# Patient Record
Sex: Female | Born: 1950 | Race: Black or African American | Hispanic: No
Health system: Midwestern US, Community
[De-identification: ages and names within clinical notes are randomized; demographics above are authoritative.]

## PROBLEM LIST (undated history)

## (undated) DIAGNOSIS — M8610 Other acute osteomyelitis, unspecified site: Principal | ICD-10-CM

## (undated) DIAGNOSIS — I739 Peripheral vascular disease, unspecified: Principal | ICD-10-CM

## (undated) DIAGNOSIS — I70244 Atherosclerosis of native arteries of left leg with ulceration of heel and midfoot: Principal | ICD-10-CM

## (undated) DIAGNOSIS — I70234 Atherosclerosis of native arteries of right leg with ulceration of heel and midfoot: Secondary | ICD-10-CM

## (undated) DIAGNOSIS — H90A22 Sensorineural hearing loss, unilateral, left ear, with restricted hearing on the contralateral side: Secondary | ICD-10-CM

## (undated) DIAGNOSIS — E785 Hyperlipidemia, unspecified: Secondary | ICD-10-CM

## (undated) DIAGNOSIS — G8929 Other chronic pain: Secondary | ICD-10-CM

## (undated) DIAGNOSIS — I10 Essential (primary) hypertension: Secondary | ICD-10-CM

## (undated) DIAGNOSIS — Z760 Encounter for issue of repeat prescription: Secondary | ICD-10-CM

## (undated) DIAGNOSIS — M25562 Pain in left knee: Secondary | ICD-10-CM

## (undated) DIAGNOSIS — M25561 Pain in right knee: Secondary | ICD-10-CM

## (undated) DIAGNOSIS — M542 Cervicalgia: Secondary | ICD-10-CM

## (undated) DIAGNOSIS — M25511 Pain in right shoulder: Secondary | ICD-10-CM

## (undated) DIAGNOSIS — J432 Centrilobular emphysema: Secondary | ICD-10-CM

---

## 2005-09-11 NOTE — Procedures (Signed)
[facility]                         PERIPHERAL VASCULAR LABORATORY   NAME:     CLEMONS, Omnia                   DATE:   09/11/2005   AGE/DOB: 54  /  08/17/1951                      ROOM #: OP   SEX:      F                                 MR #:    [id number]   CPT CODE: 93970                            SS#     [id number]   REFERRING PHYSICIAN:   LISA SHEA KENNEDY, M.D.   CHIEF COMPLAINT/SYMPTOMS:  PAIN IN LIMB   EXAMINATION:   LOWER EXTREMITY VENOUS   INTERPRETATION:    Bilateral lower extremity venous Doppler exam revealed   patent vessels with normal spontaneity and phasicity of signals with normal   augmentation.  Deep venous valves appeared competent.   Duplex imaging revealed no intraluminal thrombus of the lower extremities   bilaterally.   IMPRESSION:   No evidence of bilateral lower extremity deep venous thrombosis or   significant venous valvular incompetence.   Electronically Signed By:   RASESH M. SHAH, M.D. 09/13/2005 12:39   ______________________________________________   RASESH M. SHAH, M.D.   lo  D: 09/12/2005  T: 09/12/2005  7:14 P  [id number]

## 2005-09-12 NOTE — Procedures (Signed)
[facility]                         PERIPHERAL VASCULAR LABORATORY   NAME:     [name], Susan                   DATE:   09/11/2005   AGE/DOB: 54  /  25-Aug-1951                      ROOM #: OP   SEX:      F                                 MR #:    [id number]   CPT CODE: 04540                            SS#     [id number]   REFERRING PHYSICIAN:   Stacey Drain, M.D.   CHIEF COMPLAINT/SYMPTOMS:  PAIN IN LIMB   EXAMINATION:   LOWER EXTREMITY VENOUS   INTERPRETATION:    Bilateral lower extremity venous Doppler exam revealed   patent vessels with normal spontaneity and phasicity of signals with normal   augmentation.  Deep venous valves appeared competent.   Duplex imaging revealed no intraluminal thrombus of the lower extremities   bilaterally.   IMPRESSION:   No evidence of bilateral lower extremity deep venous thrombosis or   significant venous valvular incompetence.   Electronically Signed By:   Michael Litter, M.D. 09/13/2005 12:39   ______________________________________________   Michael Litter, M.D.   lo  D: 09/12/2005  T: 09/12/2005  7:14 P  [id number]

## 2010-07-17 ENCOUNTER — Emergency Department (HOSPITAL_COMMUNITY): Admission: EM | Admit: 2010-07-17 | Discharge: 2010-07-17 | Payer: Self-pay | Admitting: Emergency Medicine

## 2010-07-19 ENCOUNTER — Emergency Department (HOSPITAL_COMMUNITY): Admission: EM | Admit: 2010-07-19 | Discharge: 2010-07-20 | Payer: Self-pay | Admitting: Emergency Medicine

## 2010-07-19 ENCOUNTER — Emergency Department (HOSPITAL_COMMUNITY): Admission: EM | Admit: 2010-07-19 | Discharge: 2010-07-19 | Payer: Self-pay | Admitting: Family Medicine

## 2010-12-14 LAB — POCT URINALYSIS DIPSTICK
Glucose, UA: NEGATIVE mg/dL
Ketones, ur: 15 mg/dL — AB
Nitrite: NEGATIVE

## 2010-12-14 LAB — COMPREHENSIVE METABOLIC PANEL WITH GFR
Albumin: 4.3 g/dL (ref 3.5–5.2)
BUN: 21 mg/dL (ref 6–23)
Calcium: 9.8 mg/dL (ref 8.4–10.5)
Chloride: 97 meq/L (ref 96–112)
Creatinine, Ser: 1.07 mg/dL (ref 0.4–1.2)
Total Bilirubin: 0.8 mg/dL (ref 0.3–1.2)

## 2010-12-14 LAB — URINALYSIS, ROUTINE W REFLEX MICROSCOPIC
Glucose, UA: NEGATIVE mg/dL
Ketones, ur: 15 mg/dL — AB
Leukocytes, UA: NEGATIVE
Nitrite: NEGATIVE
Protein, ur: 30 mg/dL — AB
Protein, ur: NEGATIVE mg/dL
Specific Gravity, Urine: 1.042 — ABNORMAL HIGH (ref 1.005–1.030)
Urobilinogen, UA: 1 mg/dL (ref 0.0–1.0)
Urobilinogen, UA: 1 mg/dL (ref 0.0–1.0)
pH: 5.5 (ref 5.0–8.0)

## 2010-12-14 LAB — CBC
HCT: 38.4 % (ref 36.0–46.0)
HCT: 44.8 % (ref 36.0–46.0)
Hemoglobin: 15.2 g/dL — ABNORMAL HIGH (ref 12.0–15.0)
MCH: 31.1 pg (ref 26.0–34.0)
MCHC: 33.9 g/dL (ref 30.0–36.0)
MCV: 91.6 fL (ref 78.0–100.0)
MCV: 93.2 fL (ref 78.0–100.0)
Platelets: 183 10*3/uL (ref 150–400)
Platelets: 251 K/uL (ref 150–400)
RBC: 4.12 MIL/uL (ref 3.87–5.11)
RBC: 4.89 MIL/uL (ref 3.87–5.11)
RDW: 13.8 % (ref 11.5–15.5)
WBC: 6.9 10*3/uL (ref 4.0–10.5)
WBC: 9.8 10*3/uL (ref 4.0–10.5)

## 2010-12-14 LAB — COMPREHENSIVE METABOLIC PANEL
ALT: 25 U/L (ref 0–35)
AST: 35 U/L (ref 0–37)
Albumin: 3.7 g/dL (ref 3.5–5.2)
Alkaline Phosphatase: 72 U/L (ref 39–117)
Alkaline Phosphatase: 74 U/L (ref 39–117)
BUN: 8 mg/dL (ref 6–23)
CO2: 28 mEq/L (ref 19–32)
Chloride: 102 mEq/L (ref 96–112)
GFR calc Af Amer: >60 mL/min (ref >60)
GFR calc non Af Amer: 53 mL/min — ABNORMAL LOW (ref >60)
Glucose, Bld: 117 mg/dL — ABNORMAL HIGH (ref 70–99)
Glucose, Bld: 128 mg/dL — ABNORMAL HIGH (ref 70–99)
Potassium: 2.9 mEq/L — ABNORMAL LOW (ref 3.5–5.1)
Potassium: 3.7 mEq/L (ref 3.5–5.1)
Sodium: 139 mEq/L (ref 135–145)
Total Bilirubin: 0.7 mg/dL (ref 0.3–1.2)
Total Protein: 8.6 g/dL — ABNORMAL HIGH (ref 6.0–8.3)

## 2010-12-14 LAB — DIFFERENTIAL
Basophils Absolute: 0 10*3/uL (ref 0.0–0.1)
Basophils Absolute: 0 K/uL (ref 0.0–0.1)
Basophils Relative: 0 % (ref 0–1)
Basophils Relative: 0 % (ref 0–1)
Eosinophils Absolute: 0 10*3/uL (ref 0.0–0.7)
Eosinophils Absolute: 0 10*3/uL (ref 0.0–0.7)
Eosinophils Relative: 0 % (ref 0–5)
Eosinophils Relative: 0 % (ref 0–5)
Lymphocytes Relative: 16 % (ref 12–46)
Lymphs Abs: 1.6 10*3/uL (ref 0.7–4.0)
Monocytes Absolute: 0.6 10*3/uL (ref 0.1–1.0)
Monocytes Absolute: 0.8 K/uL (ref 0.1–1.0)
Monocytes Relative: 9 % (ref 3–12)
Neutro Abs: 4.8 10*3/uL (ref 1.7–7.7)
Neutro Abs: 7.4 K/uL (ref 1.7–7.7)
Neutrophils Relative %: 75 % (ref 43–77)

## 2010-12-14 LAB — LIPASE, BLOOD: Lipase: 20 U/L (ref 11–59)

## 2010-12-14 LAB — URINE MICROSCOPIC-ADD ON

## 2010-12-14 LAB — RAPID URINE DRUG SCREEN, HOSP PERFORMED: Barbiturates: NOT DETECTED

## 2011-08-14 IMAGING — CR DG ABDOMEN ACUTE W/ 1V CHEST
4 series · 4 of 4 positions shown · non-contrast
Comparison: none

CLINICAL DATA: Flu-like symptoms

ACUTE ABDOMEN SERIES (ABDOMEN 2 VIEW & CHEST 1 VIEW)

[w chest pa]
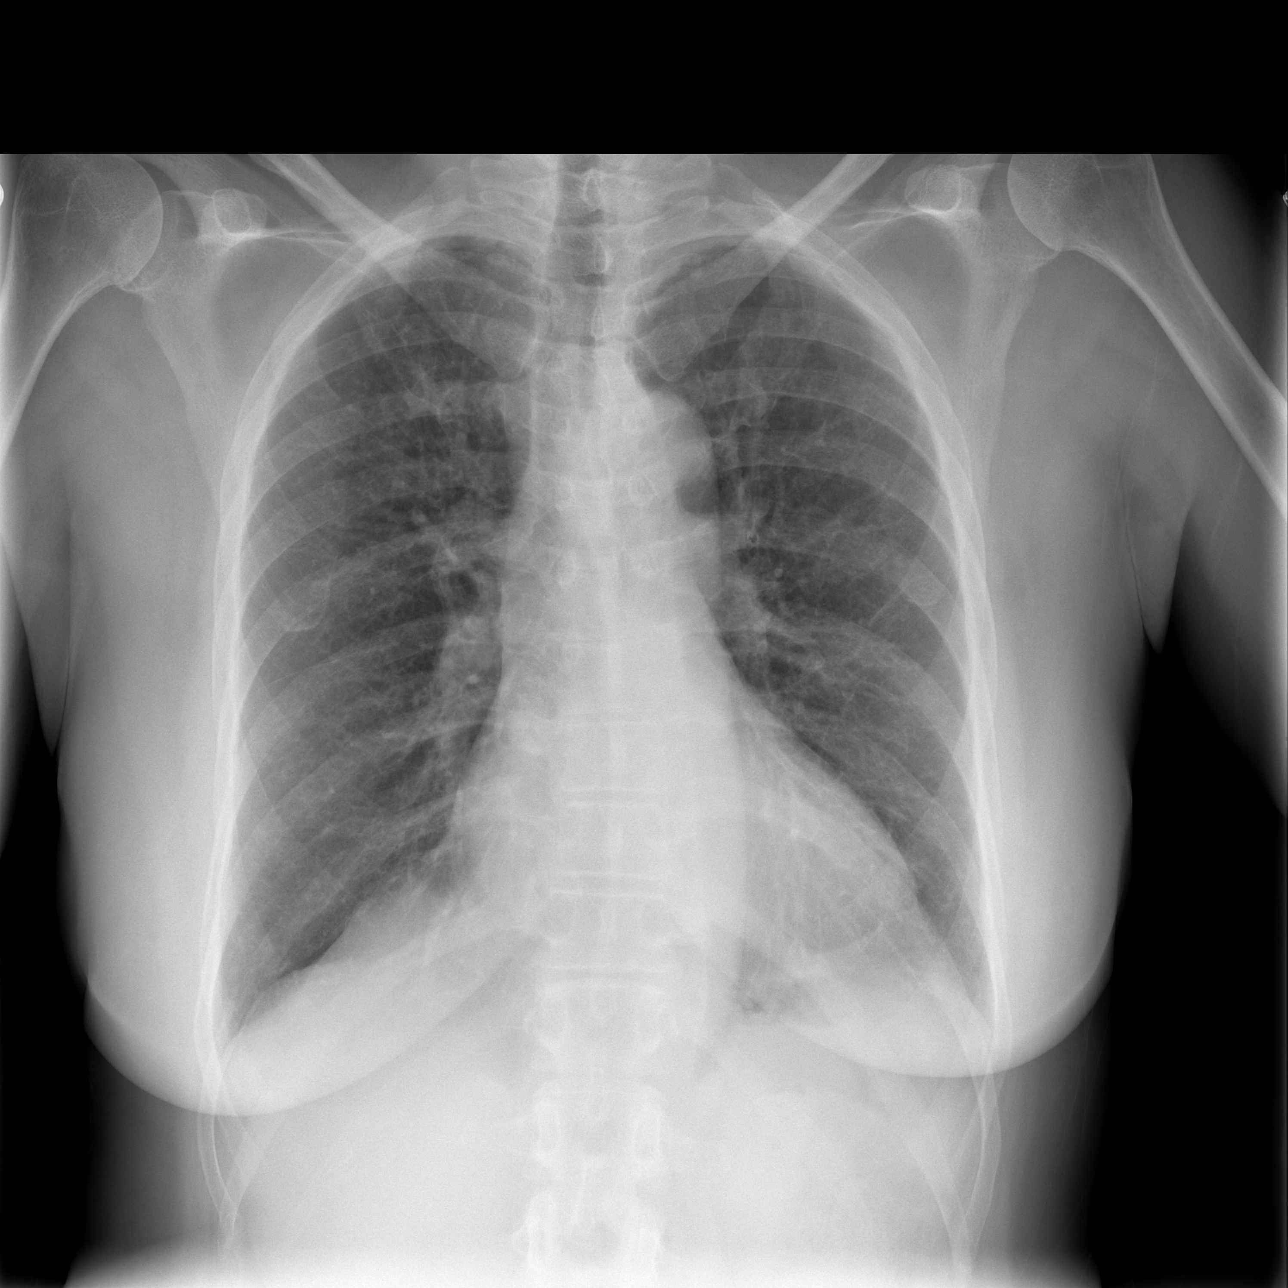

[w abdomen upright]
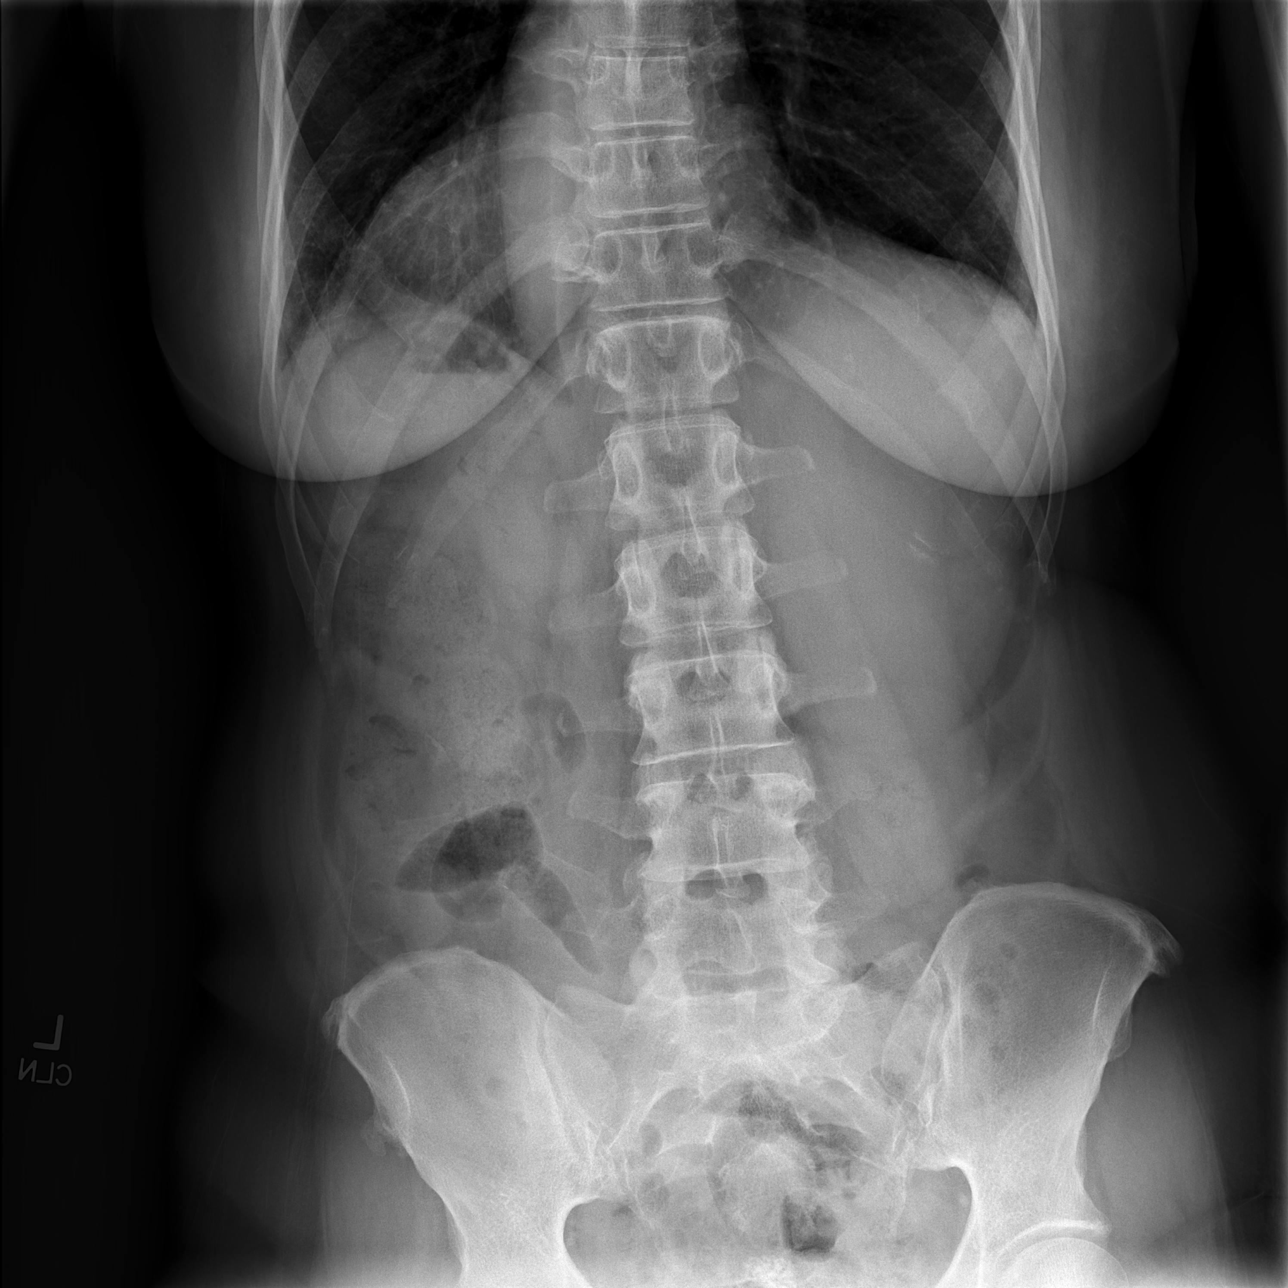

[t abdomen supine (1 of 2)]
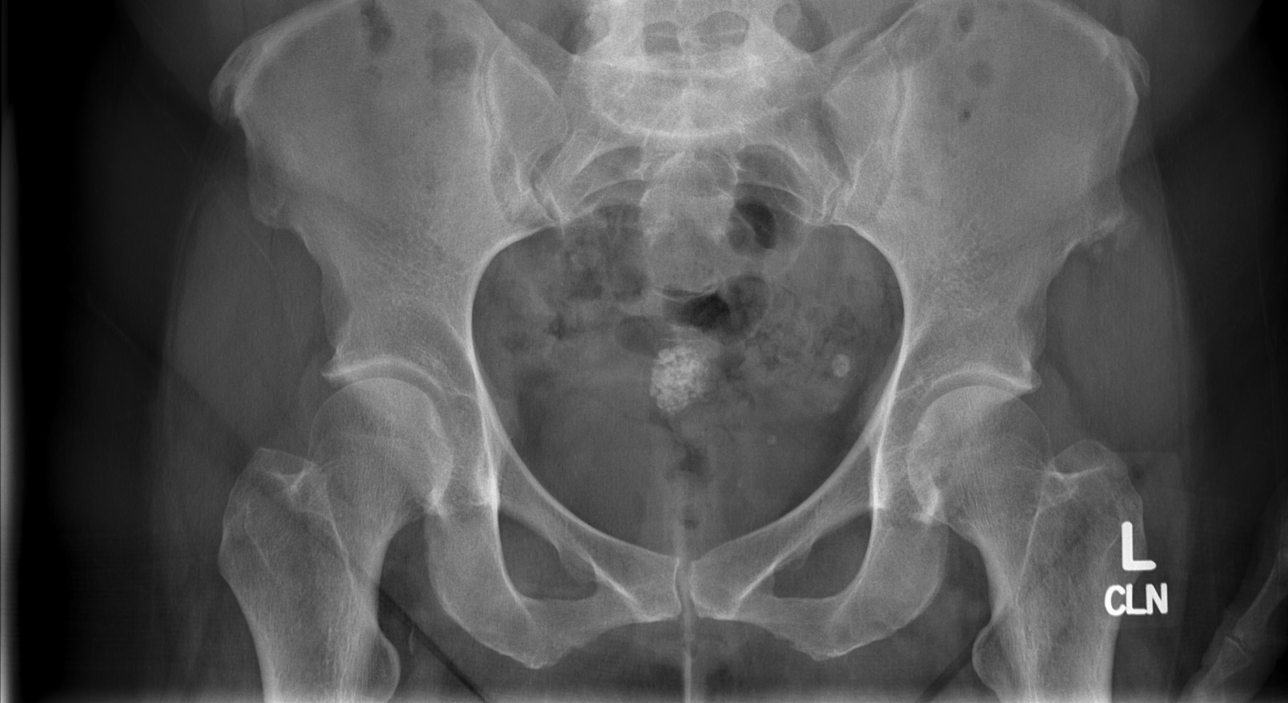

[t abdomen supine (2 of 2)]
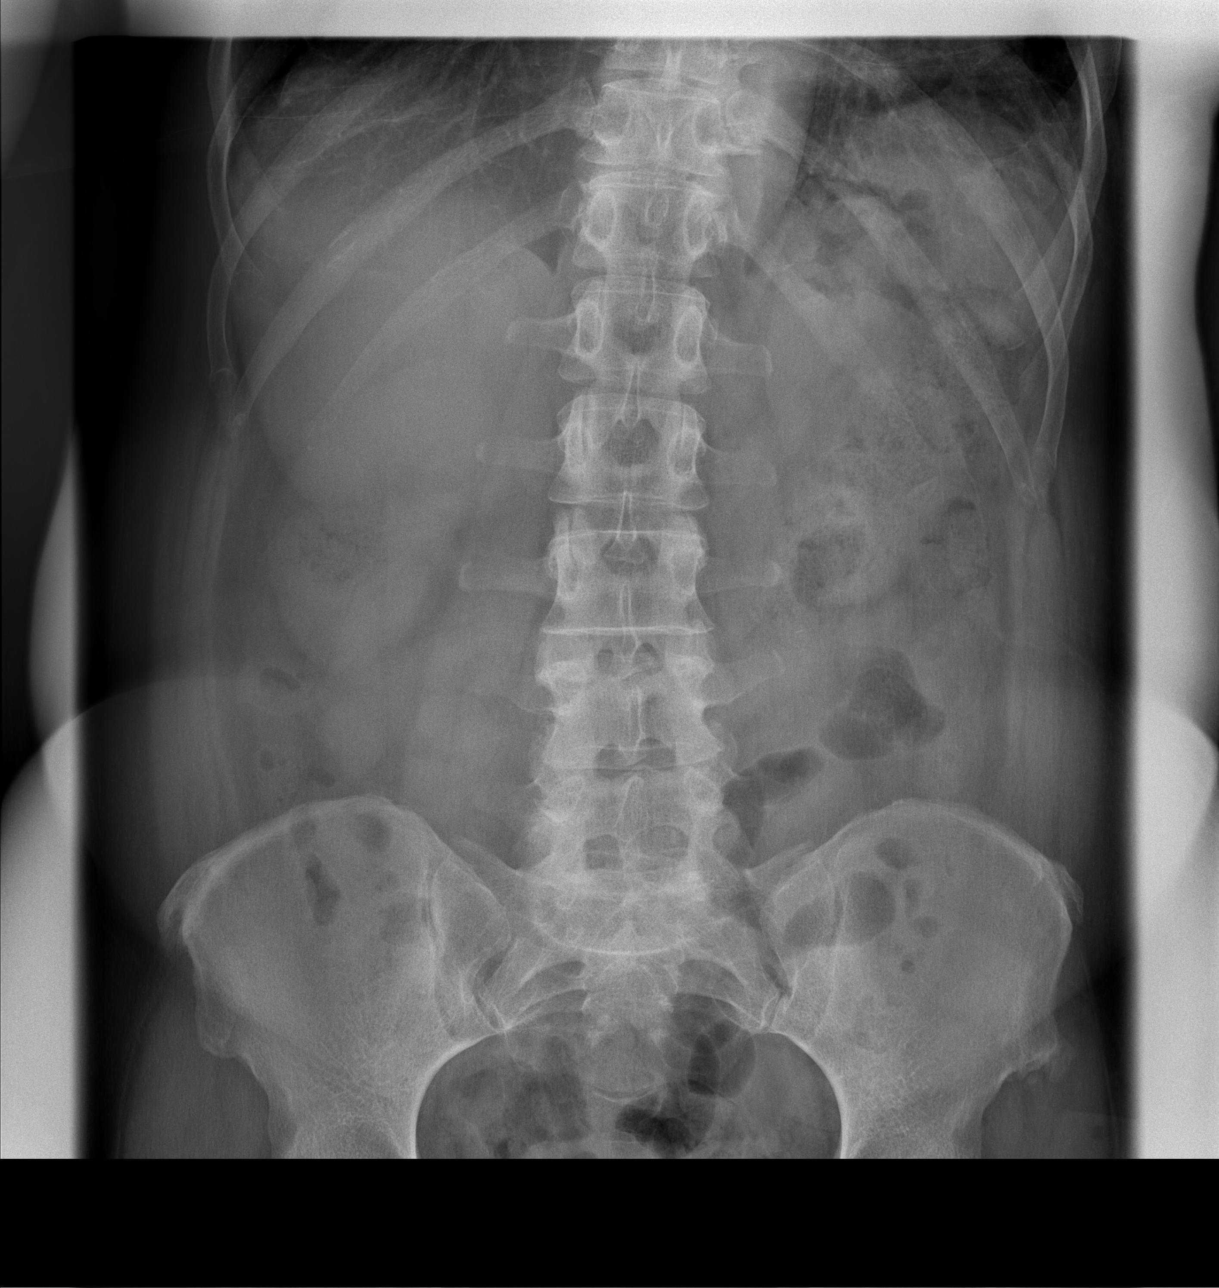

[4 of 4 positions shown; findings below may reference images not displayed]

FINDINGS: Normal mediastinum and heart silhouette.  There is
chronic bronchitic change in the lung fields.  No focal
consolidation.  No free air beneath the hemidiaphragms.

No dilated loops of large or small bowel.  There is gas and stool
in the rectosigmoid colon.  Calcified leiomyoma within the uterus.
No other pathologic calcifications.  Acute bony abnormality.
IMPRESSION: 1.  No acute cardiopulmonary process.
2.  No evidence of bowel obstruction or intraperitoneal free air.

## 2011-11-22 LAB — DRUG SCREEN, URINE
AMPHETAMINES: NEGATIVE
BARBITURATES: POSITIVE — AB
BENZODIAZEPINES: NEGATIVE
COCAINE: NEGATIVE
METHADONE: NEGATIVE
OPIATES: NEGATIVE
PCP(PHENCYCLIDINE): NEGATIVE
THC (TH-CANNABINOL): NEGATIVE

## 2011-11-22 LAB — ETHYL ALCOHOL: ALCOHOL(ETHYL),SERUM: <3 MG/DL (ref 0–3)

## 2011-11-22 MED ORDER — KETOROLAC TROMETHAMINE 60 MG/2 ML IM
60 mg/2 mL | INTRAMUSCULAR | Status: AC
Start: 2011-11-22 — End: 2011-11-22
  Administered 2011-11-22: 16:00:00 via INTRAMUSCULAR

## 2011-11-22 MED ORDER — LISINOPRIL 10 MG TAB
10 mg | ORAL | Status: AC
Start: 2011-11-22 — End: 2011-11-22
  Administered 2011-11-22: 16:00:00 via ORAL

## 2011-11-22 MED ORDER — BUTALBITAL-ACETAMINOPHEN-CAFFEINE 50 MG-325 MG-40 MG TAB
50-325-40 mg | ORAL | Status: AC
Start: 2011-11-22 — End: 2011-11-22
  Administered 2011-11-22: 17:00:00 via ORAL

## 2011-11-22 MED FILL — LISINOPRIL 10 MG TAB: 10 mg | ORAL | Qty: 1

## 2011-11-22 MED FILL — KETOROLAC TROMETHAMINE 60 MG/2 ML IM: 60 mg/2 mL | INTRAMUSCULAR | Qty: 2

## 2011-11-22 MED FILL — BUTALBITAL-ACETAMINOPHEN-CAFFEINE 50 MG-325 MG-40 MG TAB: 50-325-40 mg | ORAL | Qty: 1

## 2011-11-22 NOTE — ED Notes (Signed)
Assumed care of pt from mike spears, rn. Pt resting on cart with eyes closed. Equal and unlabored resp noted. No acute distress noted.will continue to monitor pt.

## 2011-11-22 NOTE — Other (Signed)
Pt presents to ED with complaint of being out of her medications and suicidal ideations. Pt reports she has been having increased depression since the death of her mother. Pt reports that her mother died last year on 2022-12-06. Pt states she has been off her medications for past two months due to leaving [place] and relocating to [place]. Pt reports she is unemployed and" partially homeless". Pt's mood is depressed with sad, tearful mood. Pt reports difficulty sleeping. Pt states "I am just tired and don't feel I have any reason to be here". Pt reports previous suicide attempt via overdose of medications while living in [place]. Pt is unable to contract for safety at this time. Pt is voluntary for MH/SA treatment at this time. Discussed with PA Thamas Jaegers and he concurs with MH/SA treatment at this time. Contacted [facility] to evaluate for Crisis Stabilization if appropriate.

## 2011-11-22 NOTE — ED Notes (Signed)
Pt resting comfortably with eyes closed @ this time. Respirations even and unlabored. Easily awakens to voice and light touch.  No distress noted.  Will continue to monitor.

## 2011-11-22 NOTE — ED Provider Notes (Addendum)
HPI Comments: Pt here after duress of homelessness, living with her sister-in-law, moving from [place] to [place] 2 yrs ago to take care of her aging mother, she passed 1 yr ago. Then she couldn't keep her mother's house. Her husband is incarcerated, she moved to [place] with her girlfriends and then back to [place] 2 months ago. Has been out of her 2 antihtns and her 2 psych meds for depression. Denies cp/sob/cough/fever. Pt has also had a headache for a week or so which is typical with stress related headaches for her.    The history is provided by the patient. The history is limited by the absence of a caregiver.        Past Medical History   Diagnosis Date   ??? Hypertension    ??? Asthma    ??? Psychiatric disorder    ??? Bipolar 1 disorder         Past Surgical History   Procedure Date   ??? Hx heent          No family history on file.     History     Social History   ??? Marital Status: LEGALLY SEPARATED     Spouse Name: N/A     Number of Children: N/A   ??? Years of Education: N/A     Occupational History   ??? Not on file.     Social History Main Topics   ??? Smoking status: Current Everyday Smoker   ??? Smokeless tobacco: Not on file   ??? Alcohol Use: No   ??? Drug Use: No   ??? Sexually Active: No     Other Topics Concern   ??? Not on file     Social History Narrative   ??? No narrative on file                  ALLERGIES: Penicillins      Review of Systems   Constitutional:        SEE HPI   Eyes: Negative.    Respiratory: Negative.    Cardiovascular: Negative.    Gastrointestinal: Negative.    Genitourinary: Negative.    Musculoskeletal: Negative.    Skin: Negative.    Neurological: Positive for headaches. Negative for dizziness, tremors, seizures, syncope, facial asymmetry, speech difficulty, weakness, light-headedness and numbness.   Hematological: Negative.    Psychiatric/Behavioral: Positive for suicidal ideas. Negative for hallucinations, behavioral problems, confusion, disturbed wake/sleep cycle, self-injury, dysphoric mood, decreased  concentration and agitation. The patient is nervous/anxious. The patient is not hyperactive.    All other systems reviewed and are negative.        Filed Vitals:    11/22/11 1032   BP: 185/107   Pulse: 72   Temp: 98.1 ??F (36.7 ??C)   Resp: 16   SpO2: 100%            Physical Exam   Nursing note and vitals reviewed.  Constitutional: She is oriented to person, place, and time. She appears well-developed and well-nourished.   HENT:   Head: Normocephalic and atraumatic.   Right Ear: External ear normal.   Left Ear: External ear normal.   Nose: Nose normal.   Mouth/Throat: Oropharynx is clear and moist.   Eyes: Conjunctivae and EOM are normal. Pupils are equal, round, and reactive to light. Right eye exhibits no discharge. Left eye exhibits no discharge. No scleral icterus.        Noted OS lateral strabismus otherwise EOMI   Neck:  Normal range of motion. Neck supple.   Cardiovascular: Normal rate, normal heart sounds and intact distal pulses.    Pulmonary/Chest: Effort normal and breath sounds normal.   Abdominal: Soft. Bowel sounds are normal.   Musculoskeletal: Normal range of motion.   Neurological: She is alert and oriented to person, place, and time. She has normal reflexes.   Skin: Skin is warm and dry.   Psychiatric: Her behavior is normal. Judgment and thought content normal.        Pt feels suicidal and that she has no options and feels helpless        MDM     Differential Diagnosis; Clinical Impression; Plan:     Discussion with pt and her psych complaints with Psych. DR. Velora Heckler phd.    [facility] has seen awaiting a bed. Dr Lovell Sheehan to re- eval in the am to see if a bed has been found.    Dr. Lovell Sheehan to see pt again to check on bed status.    Pt has dispo [facility] DR. Marge Duncans .      Procedures  Diagnosis: @DIAGX (<PARAMETER> error)@      Disposition: Transfer     Follow-up Information     None          Patient's Medications    No medications on file

## 2011-11-22 NOTE — ED Notes (Signed)
Medications taken from pt and given to charge RN.

## 2011-11-22 NOTE — ED Notes (Addendum)
"  I'm suicidal" Pt denies plan. States hearing voices and people are trying to get at her. Pt calm and cooperative. Pt states out of psych meds x 1 month. Pt states police brought her in today. Police officers did not stay to speak to triage nurse about pt. Pt also states has headache since yesterday. Pt states out of BP meds x 1 month as well. Charge nurse notified of pt condition.

## 2011-11-22 NOTE — ED Notes (Signed)
Pt here D/T having suicidal ideations. States that she has been experiencing a headache for the past 2 days and has felt suicidal for the same amount of time. States not had her meds for depression and bi-polar disorder for over a month, nor has she had her BP meds either for the same amount of time. States being under a lot of stress since her mother passed away. States she is homeless and jobless.

## 2011-11-22 NOTE — ED Notes (Signed)
Pt c/o headache. Pt states unsure if it is from headache or blood pressure. VS to PA [place]. Pt given warm blanket.

## 2011-11-23 MED ORDER — OXYCODONE-ACETAMINOPHEN 5 MG-325 MG TAB
5-325 mg | ORAL | Status: AC
Start: 2011-11-23 — End: 2011-11-22
  Administered 2011-11-23: 02:00:00 via ORAL

## 2011-11-23 MED FILL — OXYCODONE-ACETAMINOPHEN 5 MG-325 MG TAB: 5-325 mg | ORAL | Qty: 1

## 2011-11-23 NOTE — ED Notes (Signed)
Admission assessment( braden scale, nutrition and valuables ) all entered on wrong patient.

## 2011-11-23 NOTE — ED Notes (Signed)
Assumed care- Resting quietly on stretcher- Awaiting bed placement/ final disposition.

## 2011-11-23 NOTE — ED Notes (Signed)
Remains sleeping quietly- Breakfast ordered.

## 2011-11-23 NOTE — ED Notes (Signed)
Up to bathroom as needed- Breakfast tray delivered-

## 2011-11-23 NOTE — ED Notes (Signed)
Patient resting on stretcher in no evident distress at this time. Breathing even and unlabored. Vital signs stable on bedside monitor. Eyes closed. Will continue to monitor.

## 2011-11-23 NOTE — ED Provider Notes (Signed)
Patient is a 61 y.o. female presenting with chest pain. The history is provided by the patient.   Chest Pain (Angina)   This is a new problem. The current episode started yesterday. The problem has not changed since onset.The problem occurs constantly. The pain is at a severity of 6/10. The pain is mild. The quality of the pain is described as pressure-like. The pain radiates to the left arm. The symptoms are aggravated by movement. Pertinent negatives include no abdominal pain, no back pain, no claudication, no cough, no diaphoresis, no dizziness, no fever, no headaches, no hemoptysis, no leg pain, no malaise/fatigue, no nausea, no numbness, no orthopnea, no palpitations, no PND, no shortness of breath, no vomiting and no weakness. She has tried nothing for the symptoms. Risk factors include smoking/tobacco exposure and hypertension. Her past medical history is significant for HTN.Her past medical history does not include aneurysm, cancer, DM, DVT, PE or CHF.    HPI: patient was just released from our ED yesterday for SI. [facility] came to evaluate her and they placed her in a psychiatric facility in [place]. While she was there she told the staff she had chest pain. Interestingly enough the chest pain has been going on for over 48 hours but never told the ED staff or ED physician when she was in the ED yesterday for SI. She tells me she has no cardiac history, no previous MI. The pain is reproducible is over the left anterior chest wall and radiates into the left arm. No SOB. The patient is a smoker    Past Medical History   Diagnosis Date   ??? Hypertension    ??? Asthma    ??? Psychiatric disorder    ??? Bipolar 1 disorder         Past Surgical History   Procedure Date   ??? Hx heent          No family history on file.     History     Social History   ??? Marital Status: LEGALLY SEPARATED     Spouse Name: N/A     Number of Children: N/A   ??? Years of Education: N/A     Occupational History   ??? Not on file.     Social History  Main Topics   ??? Smoking status: Current Everyday Smoker   ??? Smokeless tobacco: Not on file   ??? Alcohol Use: No   ??? Drug Use: No   ??? Sexually Active: No     Other Topics Concern   ??? Not on file     Social History Narrative   ??? No narrative on file                  ALLERGIES: Penicillins      Review of Systems   Constitutional: Negative.  Negative for fever, chills, malaise/fatigue and diaphoresis.   HENT: Negative.  Negative for congestion, sore throat, rhinorrhea and neck pain.    Eyes: Negative.  Negative for pain, discharge and redness.   Respiratory: Negative.  Negative for cough, hemoptysis, chest tightness, shortness of breath and wheezing.    Cardiovascular: Positive for chest pain. Negative for palpitations, orthopnea, claudication and PND.   Gastrointestinal: Negative.  Negative for nausea, vomiting, abdominal pain, diarrhea and constipation.   Genitourinary: Negative.  Negative for dysuria, urgency, frequency, hematuria and flank pain.   Musculoskeletal: Negative.  Negative for back pain.   Skin: Negative.  Negative for rash.   Neurological: Negative.  Negative for dizziness, syncope, weakness, numbness and headaches.   Hematological: Negative.    Psychiatric/Behavioral: Positive for suicidal ideas.   All other systems reviewed and are negative.        Filed Vitals:    11/23/11 1942 11/23/11 1943 11/23/11 1944 11/23/11 2000   BP:    158/92   Pulse: 62 65 71 74   Temp:   98.5 ??F (36.9 ??C)    Resp: 14 14  16    Height:   5\' 6"  (1.676 m)    Weight:   90.719 kg (200 lb)    SpO2: 96% 96%  97%            Physical Exam   Nursing note and vitals reviewed.  Constitutional: She is oriented to person, place, and time. She appears well-developed and well-nourished. She is cooperative.  Non-toxic appearance. She does not have a sickly appearance. No distress.   HENT:   Head: Normocephalic and atraumatic.   Nose: Nose normal.   Mouth/Throat: Oropharynx is clear and moist. No oropharyngeal exudate.   Eyes: Conjunctivae and  EOM are normal. Pupils are equal, round, and reactive to light. Right eye exhibits no discharge. Left eye exhibits no discharge. No scleral icterus.   Neck: Normal range of motion. Neck supple. No JVD present. No tracheal deviation, no edema and normal range of motion present.   Cardiovascular: Normal rate, regular rhythm, normal heart sounds and intact distal pulses.  Exam reveals no gallop and no friction rub.    No murmur heard.  Pulmonary/Chest: Effort normal and breath sounds normal. No stridor. Not tachypneic. No respiratory distress. She has no wheezes. She has no rhonchi. She has no rales. She exhibits no tenderness.   Abdominal: Soft. She exhibits no distension. There is no tenderness. There is no rebound, no guarding and no CVA tenderness.   Musculoskeletal: Normal range of motion. She exhibits tenderness. She exhibits no edema.        Reproducible chest wall pain over the left anterior chest wall   Lymphadenopathy:     She has no cervical adenopathy.   Neurological: She is alert and oriented to person, place, and time. She has normal reflexes. No cranial nerve deficit or sensory deficit. She exhibits normal muscle tone. Coordination normal. GCS eye subscore is 4. GCS verbal subscore is 5. GCS motor subscore is 6.   Skin: Skin is dry. No rash noted. No erythema.   Psychiatric: She has a normal mood and affect. Her speech is normal and behavior is normal. Judgment and thought content normal. Cognition and memory are normal.        MDM     Differential Diagnosis; Clinical Impression; Plan:     DDX: ACS, MI, chest wall pain, GERD    This patient has atypical chest pain. I am not sure why the patient sat in the ED for most of the day yesterday with chest pain but did not tell any body. It was not until she was admitted to the psychiatric facility that she decided to tell someone about the chest pain. Her chest pain is reproducible and has now been ongoing for over 24 hours. Her cardiac enzymes are all normal  and this leads me to believe this pain is not likely cardiac although I cannot rule out ACS in this case.    Patient has had no chest pain a few hours after being in the ED. I have ordered a second set of trop and will have her  scheduled for a stress test today. I spoke to Dr Corinda Gubler about admitting her but he wanted to keep her in the Ed and have her get a cardiac stress test today out of the ED. Patient has been informed of the plan to keep her in the ED and get the stress test in the AM.  Amount and/or Complexity of Data Reviewed:   Clinical lab tests:  Ordered and reviewed  Tests in the radiology section of CPT??:  Ordered and reviewed   Decide to obtain previous medical records or to obtain history from someone other than the patient:  Yes  Risk of Significant Complications, Morbidity, and/or Mortality:   Presenting problems:  Moderate  Diagnostic procedures:  Moderate  Management options:  Moderate      Procedures

## 2011-11-23 NOTE — ED Notes (Signed)
Pt now awake and asking to eat. Pt given crackers and apple juices. Pt denies further needs/requests at this time.

## 2011-11-23 NOTE — ED Notes (Signed)
Crisis counselor at bedside- Awaiting breakfast and final disposition.

## 2011-11-23 NOTE — ED Notes (Signed)
Pt in  [place] awaiting final disposition by [facility].

## 2011-11-23 NOTE — ED Notes (Signed)
Intermittent left chest pain worsening since this am. No pain at this time.

## 2011-11-23 NOTE — ED Notes (Signed)
Patient ambulated to bathroom in no evident distress. Complains of intermittent sharp pains in left arm that are worse with palpation and movement.

## 2011-11-23 NOTE — ED Notes (Signed)
Bedside and Verbal shift change report given to nancy, rn (oncoming nurse) by Anel Purohit A Scotlyn Mccranie, RN (offgoing nurse).  Report given with SBAR, ED Summary and MAR.

## 2011-11-23 NOTE — ED Notes (Signed)
Patient continues to complain of pain in her left arm. Dr. Elmer Sow aware, ordered percocet.

## 2011-11-23 NOTE — ED Notes (Signed)
Moved to hallway with sitter awaiting final placement.

## 2011-11-24 LAB — CBC WITH AUTOMATED DIFF
ABS. BASOPHILS: 0 10*3/uL (ref 0.0–0.06)
ABS. EOSINOPHILS: 0.1 10*3/uL (ref 0.0–0.4)
ABS. LYMPHOCYTES: 1.9 10*3/uL (ref 0.9–3.6)
ABS. MONOCYTES: 0.4 10*3/uL (ref 0.05–1.2)
ABS. NEUTROPHILS: 3.1 10*3/uL (ref 1.8–8.0)
BASOPHILS: 0 % (ref 0–2)
EOSINOPHILS: 1 % (ref 0–5)
HCT: 42.8 % (ref 35.0–45.0)
HGB: 13.9 g/dL (ref 12.0–16.0)
LYMPHOCYTES: 34 % (ref 21–52)
MCH: 31.4 PG (ref 24.0–34.0)
MCHC: 32.5 g/dL (ref 31.0–37.0)
MCV: 96.8 FL (ref 74.0–97.0)
MONOCYTES: 8 % (ref 3–10)
MPV: 10 FL (ref 9.2–11.8)
NEUTROPHILS: 57 % (ref 40–73)
PLATELET: 249 10*3/uL (ref 135–420)
RBC: 4.42 M/uL (ref 4.20–5.30)
RDW: 13.5 % (ref 11.6–14.5)
WBC: 5.4 10*3/uL (ref 4.6–13.2)

## 2011-11-24 LAB — METABOLIC PANEL, BASIC
Anion gap: 7 mmol/L (ref 3.0–18)
BUN/Creatinine ratio: 18 (ref 12–20)
BUN: 18 MG/DL (ref 7.0–18)
CO2: 30 MMOL/L (ref 21–32)
Calcium: 9.1 MG/DL (ref 8.5–10.1)
Chloride: 104 MMOL/L (ref 100–108)
Creatinine: 1.02 MG/DL (ref 0.6–1.3)
GFR est AA: >60 mL/min/{1.73_m2} (ref >60)
GFR est non-AA: 59 mL/min/{1.73_m2} — ABNORMAL LOW (ref >60)
Glucose: 115 MG/DL — ABNORMAL HIGH (ref 74–99)
Potassium: 4.2 MMOL/L (ref 3.5–5.5)
Sodium: 141 MMOL/L (ref 136–145)

## 2011-11-24 LAB — STRESS TEST CARDIAC
ECG Interp. Before Exercise: NORMAL
Max. Diastolic BP: 100 mmHg
Max. Heart rate: 118 {beats}/min
Max. Systolic BP: 180 mmHg
Overall BP response to exercise: NORMAL
Peak Ex METs: 6.4 METS

## 2011-11-24 LAB — CARDIAC PANEL,(CK, CKMB & TROPONIN)
CK - MB: 1.8 ng/ml (ref 0.5–3.6)
CK - MB: 1.8 ng/ml (ref 0.5–3.6)
CK-MB Index: 0.9 % (ref 0.0–4.0)
CK-MB Index: 1 % (ref 0.0–4.0)
CK: 195 U/L — ABNORMAL HIGH (ref 26–192)
CK: 186 U/L (ref 26–192)
Troponin-I, QT: <0.02 NG/ML (ref 0.0–0.045)
Troponin-I, QT: <0.02 NG/ML (ref 0.0–0.045)

## 2011-11-24 LAB — PTT: aPTT: 29.2 s (ref 24.6–37.7)

## 2011-11-24 LAB — PROTHROMBIN TIME + INR
INR: 1 (ref 0.8–1.2)
Prothrombin time: 12.8 s (ref 11.5–15.2)

## 2011-11-24 MED ORDER — OXYCODONE-ACETAMINOPHEN 5 MG-325 MG TAB
5-325 mg | ORAL | Status: AC
Start: 2011-11-24 — End: 2011-11-23
  Administered 2011-11-24: 04:00:00 via ORAL

## 2011-11-24 NOTE — ED Notes (Signed)
D/w cardiology - passed STress ECHO - medically cleared from a cardiology stand point

## 2011-11-24 NOTE — ED Notes (Signed)
Bedside and Verbal shift change report given to Nancy Day, RN (oncoming nurse) by EMILY C MARBLE, RN   (offgoing nurse).  Report given with SBAR, ED Summary, Procedure Summary, Intake/Output, MAR and Recent Results.

## 2011-11-24 NOTE — ED Notes (Signed)
Breakfast given awaiting transfer.

## 2011-11-24 NOTE — ED Notes (Signed)
In Stress Echo now

## 2011-11-24 NOTE — ED Notes (Signed)
Returned from [employer] med- NO chest pain or neck pain but now has headache- Vital signs rechecked- Regular breakfast on order pending final disposition.

## 2011-11-24 NOTE — ED Notes (Signed)
D/w patient - amenable to discharge - will transport

## 2011-11-24 NOTE — ED Notes (Signed)
Patient continues to rest on stretcher in no evident distress at this time. Vital signs stable on bedside monitor. Breathing even and unlabored, patient in darkened room with eyes closed. Awaiting MD reevaluation. Will continue to observe.

## 2011-11-24 NOTE — ED Notes (Signed)
Assumed care- Just back from bathroom- Placed back on monitor/ Sinus brady- 52  DENIES chest pain awaiting morning stress test- NPO explained.

## 2011-11-29 NOTE — ED Provider Notes (Signed)
I was personally available for consultation in the emergency department.  I have reviewed the chart and agree with the documentation recorded by the MLP, including the assessment, treatment plan, and disposition.  Akshara Blumenthal P Gurbani Figge, MD

## 2013-06-19 MED ADMIN — hydrocodone-acetaminophen (NORCO) 5-325 mg per tablet 2 tablet: ORAL | @ 16:00:00 | NDC 68084036811

## 2013-06-19 NOTE — ED Notes (Signed)
I have reviewed discharge instructions with the patient.  The patient verbalized understanding. Patient armband removed and given to patient to take home.  Patient was informed of the privacy risks if armband lost or stolen

## 2013-06-19 NOTE — ED Provider Notes (Signed)
HPI Comments: Susan [name] is a 62 y.o. Female who presents to the ED with a Hx HTN, asthma, bipolar 1 disorder, psychiatric disorder c/o leg pain, tingling. Pt has tingling like "feet fell asleep." Pt states she has had this problem on and off for a while and has seen PCP for this. Pt just switched PCP's and has appointment on 10/2. Pt states she takes BP meds but does not know the name and has been out of these for a week. Pt has Rx at drug store but just hasn't had the money yet to pick it up. Pt is not a diabetic but is an everyday smoker and states her mother had poor blood circulation. No other complaints expressed at this time.     Patient is a 62 y.o. female presenting with leg pain and tingling.   Leg Pain   Associated symptoms include tingling.   Tingling  Associated symptoms include leg pain. Pertinent negatives include no fever, no headaches, no sore throat, no chest pain, no vomiting, no abdominal pain and no rash.        Past Medical History   Diagnosis Date   ??? Hypertension    ??? Asthma    ??? Bipolar 1 disorder    ??? Psychiatric disorder         Past Surgical History   Procedure Laterality Date   ??? Hx heent           History reviewed. No pertinent family history.     History     Social History   ??? Marital Status: MARRIED     Spouse Name: N/A     Number of Children: N/A   ??? Years of Education: N/A     Occupational History   ??? Not on file.     Social History Main Topics   ??? Smoking status: Current Every Day Smoker   ??? Smokeless tobacco: Not on file   ??? Alcohol Use: No   ??? Drug Use: No   ??? Sexually Active: No     Other Topics Concern   ??? Not on file     Social History Narrative   ??? No narrative on file          ALLERGIES: Penicillins      Review of Systems   Constitutional: Negative for fever and chills.   HENT: Negative for congestion and sore throat.    Eyes: Negative for redness and visual disturbance.   Respiratory: Negative for shortness of breath.    Cardiovascular: Negative for chest pain.    Gastrointestinal: Negative for vomiting, abdominal pain and diarrhea.   Genitourinary: Negative for difficulty urinating.   Musculoskeletal: Negative for myalgias and arthralgias.        Leg pains and tingling   Skin: Negative for rash.   Neurological: Positive for tingling. Negative for headaches.   Psychiatric/Behavioral: Negative for dysphoric mood.   All other systems reviewed and are negative.        Filed Vitals:    06/19/13 1122   BP: 191/117   Pulse: 68   Temp: 98.1 ??F (36.7 ??C)   Resp: 16   SpO2: 100%            Physical Exam   Nursing note and vitals reviewed.  Constitutional: She is oriented to person, place, and time. She appears well-developed and well-nourished. No distress.   HENT:   Head: Normocephalic and atraumatic.   Mouth/Throat: Oropharynx is clear and moist.  Eyes: Conjunctivae and EOM are normal. Pupils are equal, round, and reactive to light. No scleral icterus.   Neck: Normal range of motion. Neck supple.   Cardiovascular: Normal rate, regular rhythm and normal heart sounds.    No murmur heard.  Pulses:       Dorsalis pedis pulses are 2+ on the right side, and 2+ on the left side.   Pulmonary/Chest: Effort normal and breath sounds normal. No respiratory distress.   Abdominal: Soft. Bowel sounds are normal. She exhibits no distension. There is no tenderness.   Musculoskeletal: She exhibits no edema.   Lymphadenopathy:     She has no cervical adenopathy.   Neurological: She is alert and oriented to person, place, and time. Coordination normal.   Skin: Skin is warm and dry. No rash noted.   No edema, swelling or tenderness of lower extremities bilaterally.  Right leg bigger than left but Pt states it has been like this since birth.    Psychiatric: She has a normal mood and affect. Her behavior is normal.        MDM     Differential Diagnosis; Clinical Impression; Plan:     Pt has asymmetry of lower ext but says this is since birth. No evidence of dvt, no cellulitis. No diabetes, has 2+ dp  pulse bilat. Leg exam unremarkable.  Amount and/or Complexity of Data Reviewed:   Discussion of test results with the performing providers:  No   Decide to obtain previous medical records or to obtain history from someone other than the patient:  No   Obtain history from someone other than the patient:  No   Review and summarize past medical records:  No   Discuss the patient with another provider:  No   Independant visualization of image, tracing, or specimen:  No      Procedures    -------------------------------------------------------------------------------------------------------------------     EKG INTERPRETATIONS:  none    RADIOLOGY RESULTS:     none    LABORATORY RESULTS:  No results found for this or any previous visit (from the past 12 hour(s)).    ED Objective Order Summary:     Orders Placed This Encounter   ??? hydrocodone-acetaminophen (NORCO) 5-325 mg per tablet     Sig: Take 1-2 tablets PO every 4-6 hours as needed for pain control.  If over the counter ibuprofen or acetaminophen was suggested, then only take the vicodin for pain not well controlled with the over the counter medication.     Dispense:  20 tablet     Refill:  0       CONSULTATIONS:  none    PROGRESS NOTES:  11:30 AM:  Dr. Bonnetta Barry att. providers found answered the patient's questions regarding treatment.    DISPOSITION:  ED DIAGNOSIS & DISPOSITION INFORMATION  Diagnosis:   1. Hypertension    2. Peripheral neuropathy          Disposition: HOME    Follow-up Information    Follow up With Details Comments Contact Info    your new PCP  keep your appointment for October 2nd. and pick up blood pressure medications from pharmacy.            Current Discharge Medication List      START taking these medications    Details   hydrocodone-acetaminophen (NORCO) 5-325 mg per tablet Take 1-2 tablets PO every 4-6 hours as needed for pain control.  If over the counter ibuprofen or acetaminophen was suggested, then  only take the vicodin for pain not well  controlled with the over the counter medication.  Qty: 20 tablet, Refills: 0             SCRIBE ATTESTATION STATEMENT:  Documented by: Edward Qualia scribing for and in the presence of No att. providers found.    PROVIDER ATTESTATION STATEMENT:  I personally performed the services described in the documentation, reviewed the documentation, as recorded by the scribe in my presence, and it accurately and completely records my words and actions No att. providers found. 11:43 AM

## 2013-06-19 NOTE — ED Notes (Signed)
"  My legs hurt and I have a tingling sensation. My foot feels like its falling asleep."

## 2014-09-28 ENCOUNTER — Emergency Department (HOSPITAL_COMMUNITY)
Admission: EM | Admit: 2014-09-28 | Discharge: 2014-09-28 | Disposition: A | Discharge status: Home / Self Care | Payer: BC Managed Care – PPO | Attending: Emergency Medicine | Admitting: Emergency Medicine

## 2014-09-28 ENCOUNTER — Emergency Department (HOSPITAL_COMMUNITY): Payer: BC Managed Care – PPO

## 2014-09-28 DIAGNOSIS — R0602 Shortness of breath: Secondary | ICD-10-CM

## 2014-09-28 DIAGNOSIS — Z79899 Other long term (current) drug therapy: Secondary | ICD-10-CM | POA: Insufficient documentation

## 2014-09-28 DIAGNOSIS — Z880 Allergy status to penicillin: Secondary | ICD-10-CM | POA: Insufficient documentation

## 2014-09-28 DIAGNOSIS — R05 Cough: Secondary | ICD-10-CM | POA: Diagnosis present

## 2014-09-28 DIAGNOSIS — J45901 Unspecified asthma with (acute) exacerbation: Secondary | ICD-10-CM | POA: Insufficient documentation

## 2014-09-28 LAB — COMPREHENSIVE METABOLIC PANEL
ALK PHOS: 82 U/L (ref 39–117)
ALT: 19 U/L (ref 0–35)
AST: 27 U/L (ref 0–37)
Albumin: 3.7 g/dL (ref 3.5–5.2)
Anion gap: 7 (ref 5–15)
BUN: 14 mg/dL (ref 6–23)
CO2: 28 mmol/L (ref 19–32)
Calcium: 9.1 mg/dL (ref 8.4–10.5)
Chloride: 103 mEq/L (ref 96–112)
Creatinine, Ser: 0.81 mg/dL (ref 0.50–1.10)
GFR, EST AFRICAN AMERICAN: 88 mL/min — AB (ref >90)
GFR, EST NON AFRICAN AMERICAN: 76 mL/min — AB (ref >90)
GLUCOSE: 87 mg/dL (ref 70–99)
POTASSIUM: 4 mmol/L (ref 3.5–5.1)
SODIUM: 138 mmol/L (ref 135–145)
Total Bilirubin: 0.2 mg/dL — ABNORMAL LOW (ref 0.3–1.2)
Total Protein: 7.4 g/dL (ref 6.0–8.3)

## 2014-09-28 LAB — CBC WITH DIFFERENTIAL/PLATELET
Basophils Absolute: 0 10*3/uL (ref 0.0–0.1)
Basophils Relative: 0 % (ref 0–1)
Eosinophils Absolute: 0.1 10*3/uL (ref 0.0–0.7)
Eosinophils Relative: 1 % (ref 0–5)
HCT: 38.2 % (ref 36.0–46.0)
Hemoglobin: 12.1 g/dL (ref 12.0–15.0)
LYMPHS ABS: 1.8 10*3/uL (ref 0.7–4.0)
LYMPHS PCT: 30 % (ref 12–46)
MCH: 31.2 pg (ref 26.0–34.0)
MCHC: 31.7 g/dL (ref 30.0–36.0)
MCV: 98.5 fL (ref 78.0–100.0)
Monocytes Absolute: 0.5 10*3/uL (ref 0.1–1.0)
Monocytes Relative: 8 % (ref 3–12)
NEUTROS PCT: 61 % (ref 43–77)
Neutro Abs: 3.7 10*3/uL (ref 1.7–7.7)
PLATELETS: 219 10*3/uL (ref 150–400)
RBC: 3.88 MIL/uL (ref 3.87–5.11)
RDW: 13.9 % (ref 11.5–15.5)
WBC: 6.1 10*3/uL (ref 4.0–10.5)

## 2014-09-28 LAB — TROPONIN I: Troponin I: <0.03 ng/mL (ref <0.031)

## 2014-09-28 MED ORDER — IPRATROPIUM BROMIDE 0.02 % IN SOLN
0.5000 mg | Freq: Once | RESPIRATORY_TRACT | Status: AC
  Administered 2014-09-28: 0.5 mg via RESPIRATORY_TRACT
  Filled 2014-09-28: qty 2.5

## 2014-09-28 MED ORDER — PREDNISONE 10 MG PO TABS: 20.0000 mg | ORAL_TABLET | Freq: Every day | ORAL | Status: AC

## 2014-09-28 MED ORDER — METHYLPREDNISOLONE SODIUM SUCC 125 MG IJ SOLR: 125.0000 mg | Freq: Once | INTRAMUSCULAR | Status: DC

## 2014-09-28 MED ORDER — PREDNISONE 20 MG PO TABS
60.0000 mg | ORAL_TABLET | Freq: Once | ORAL | Status: AC
  Administered 2014-09-28: 60 mg via ORAL
  Filled 2014-09-28: qty 3

## 2014-09-28 MED ORDER — ALBUTEROL SULFATE (2.5 MG/3ML) 0.083% IN NEBU
5.0000 mg | INHALATION_SOLUTION | Freq: Once | RESPIRATORY_TRACT | Status: AC
  Administered 2014-09-28: 5 mg via RESPIRATORY_TRACT
  Filled 2014-09-28: qty 6

## 2014-09-28 MED ORDER — ALBUTEROL SULFATE HFA 108 (90 BASE) MCG/ACT IN AERS
2.0000 | INHALATION_SPRAY | RESPIRATORY_TRACT | Status: DC | PRN
  Administered 2014-09-28: 2 via RESPIRATORY_TRACT
  Filled 2014-09-28: qty 6.7

## 2014-09-28 NOTE — ED Notes (Signed)
Per pt, states cold symptoms which triggered her asthma-increased cough, and chest discomfort from coughing-from out of town and forgot to bring meds-

## 2014-09-28 NOTE — Discharge Instructions (Signed)
Follow up with your md next week. °

## 2014-09-28 NOTE — ED Provider Notes (Addendum)
CSN: [id number]     Arrival date & time 09/28/14  1649 History   First MD Initiated Contact with Patient 09/28/14 1657     Chief Complaint  Patient presents with  . rib cage pain   . Asthma  . Cough     (Consider location/radiation/quality/duration/timing/severity/associated sxs/prior Treatment) Patient is a 63 y.o. female presenting with asthma and cough. The history is provided by the patient (the pt complains of sob).  Asthma This is a recurrent problem. The current episode started 12 to 24 hours ago. The problem occurs constantly. The problem has not changed since onset.Associated symptoms include shortness of breath. Pertinent negatives include no chest pain, no abdominal pain and no headaches. Nothing aggravates the symptoms. Nothing relieves the symptoms. She has tried nothing for the symptoms. The treatment provided no relief.  Cough Associated symptoms: shortness of breath and wheezing   Associated symptoms: no chest pain, no eye discharge, no headaches and no rash     No past medical history on file. No past surgical history on file. No family history on file. History  Substance Use Topics  . Smoking status: Not on file  . Smokeless tobacco: Not on file  . Alcohol Use: Not on file   OB History    No data available     Review of Systems  Constitutional: Negative for appetite change and fatigue.  HENT: Negative for congestion, ear discharge and sinus pressure.   Eyes: Negative for discharge.  Respiratory: Positive for cough, shortness of breath and wheezing.   Cardiovascular: Negative for chest pain.  Gastrointestinal: Negative for abdominal pain and diarrhea.  Genitourinary: Negative for frequency and hematuria.  Musculoskeletal: Negative for back pain.  Skin: Negative for rash.  Neurological: Negative for seizures and headaches.  Psychiatric/Behavioral: Negative for hallucinations.      Allergies  Penicillins  Home Medications   Prior to Admission  medications   Medication Sig Start Date End Date Taking? Authorizing Provider  albuterol (PROVENTIL HFA;VENTOLIN HFA) 108 (90 BASE) MCG/ACT inhaler Inhale 2 puffs into the lungs every 6 (six) hours as needed for wheezing or shortness of breath (shortness of breath).   Yes Historical Provider, MD  lisinopril (PRINIVIL,ZESTRIL) 10 MG tablet Take 10 mg by mouth daily.   Yes Historical Provider, MD  QUEtiapine (SEROQUEL) 100 MG tablet Take 100 mg by mouth at bedtime.   Yes Historical Provider, MD  venlafaxine XR (EFFEXOR XR) 150 MG 24 hr capsule Take 150 mg by mouth daily with breakfast.   Yes Historical Provider, MD  venlafaxine XR (EFFEXOR-XR) 75 MG 24 hr capsule Take 75 mg by mouth daily with breakfast.   Yes Historical Provider, MD  predniSONE (DELTASONE) 10 MG tablet Take 2 tablets (20 mg total) by mouth daily. 09/28/14   Benny LennertJoseph L Cyrus Ramsburg, MD   BP 158/93 mmHg  Pulse 61  Temp(Src) 97.9 F (36.6 C) (Oral)  Resp 16  SpO2 96% Physical Exam  Constitutional: She is oriented to person, place, and time. She appears well-developed.  HENT:  Head: Normocephalic.  Eyes: Conjunctivae and EOM are normal. No scleral icterus.  Neck: Neck supple. No thyromegaly present.  Cardiovascular: Normal rate and regular rhythm.  Exam reveals no gallop and no friction rub.   No murmur heard. Pulmonary/Chest: No stridor. She has wheezes. She has no rales. She exhibits no tenderness.  Abdominal: She exhibits no distension. There is no tenderness. There is no rebound.  Musculoskeletal: Normal range of motion. She exhibits no edema.  Lymphadenopathy:  She has no cervical adenopathy.  Neurological: She is oriented to person, place, and time. She exhibits normal muscle tone. Coordination normal.  Skin: No rash noted. No erythema.  Psychiatric: She has a normal mood and affect. Her behavior is normal.    ED Course  Procedures (including critical care time) Labs Review Labs Reviewed  COMPREHENSIVE METABOLIC PANEL  - Abnormal; Notable for the following:    Total Bilirubin 0.2 (*)    GFR calc non Af Amer 76 (*)    GFR calc Af Amer 88 (*)    All other components within normal limits  TROPONIN I  CBC WITH DIFFERENTIAL    Imaging Review Dg Chest 2 View  09/28/2014   CLINICAL DATA:  Chronic asthma, shortness of breath, cough. Increased chest tightness. Symptoms starting earlier today.  EXAM: CHEST  2 VIEW  COMPARISON:  None.  FINDINGS: There is hyperinflation of the lungs compatible with COPD. Peribronchial cuffing, likely chronic bronchitis. No confluent opacities or effusions. No acute bony abnormality.  IMPRESSION: COPD/ chronic bronchitis.  No active disease.   Electronically Signed   By: Charlett NoseKevin  Dover M.D.   On: 09/28/2014 17:32     EKG Interpretation None      MDM   Final diagnoses:  SOB (shortness of breath)  Asthma attack    Asthma.  Pt improved with tx.  Out of albuterol.   tx with albuterol and prednisone and follow up    Benny LennertJoseph L Stephen Turnbaugh, MD 09/28/14 1943  Benny LennertJoseph L Aicia Babinski, MD 09/28/14 [phone]

## 2014-10-08 LAB — POC CHEM8
BUN: 19 mg/dl (ref 7–25)
CALCIUM,IONIZED: 5.2 mg/dL (ref 4.40–5.40)
CO2, TOTAL: 29 mmol/L (ref 21–32)
Chloride: 98 mEq/L (ref 98–107)
Creatinine: 1 mg/dl (ref 0.6–1.3)
Glucose: 91 mg/dL (ref 74–106)
HCT: 45 % (ref 38–45)
HGB: 15.3 gm/dl (ref 12.4–17.2)
Potassium: 4.2 mEq/L (ref 3.5–4.9)
Sodium: 140 mEq/L (ref 136–145)

## 2014-10-08 NOTE — ED Provider Notes (Addendum)
[facility]  EMERGENCY DEPARTMENT TREATMENT REPORT  NAME:  Susan [name]  SEX:   F  ADMIT: 10/08/2014  DOB:   [birth year]  MR#    [id number]  ROOM:    TIME DICTATED: 03 56 PM  ACCT#  [id number]        CHIEF COMPLAINT:  Headache.    HISTORY OF PRESENT ILLNESS:  This is a 64 year old female with headache times 1 week, describes it as  frontal, throbbing in nature.  Has had some sinus congestion, has been under  more stress recently as well.  Symptoms have been going on times 1 week.  Denies any focal numbness or weakness.  She does have some tingling in her  left hand, but she has had issues with carpal tunnel in the past and it really  has not changed.  No slurred speech.    REVIEW OF SYSTEMS:  NEUROLOGICAL:  As noted.  RESPIRATORY:  No cough, shortness of breath, or wheezing.  CARDIOVASCULAR:  No chest pain, chest pressure, or palpitations.    Denies complaints in all other systems.       PAST MEDICAL HISTORY:  Remarkable for asthma, hypertension, depression.    SOCIAL HISTORY:  The patient denies alcohol, tobacco or drug abuse.    FAMILY HISTORY:  Noncontributory.    PHYSICAL EXAMINATION:  GENERAL:  The patient is alert and oriented times 3, cooperative.  VITAL SIGNS:  Blood pressure 151/90, pulse rate of 66, respiratory rate of 18,  temperature 98.1, O2 saturation is not documented.  The patient denies any  dyspnea whatsoever.  Respiratory rate by my count is 16.  HEENT:  Eyes:  Conjunctivae clear, lids normal.  Pupils equal, symmetrical,  and normally reactive.  Fundi: Optic discs are normal in appearance, no gross  hemorrhages or exudates seen.  Mouth/Throat:  Surfaces of the pharynx, palate,  and tongue are pink, moist, and without lesions.  NECK:  Exam shows some mild bilateral paracervical tenderness to palpation  which is somewhat similar to the patient's headache.  RESPIRATORY:  Clear and equal breath sounds.  No respiratory distress,  tachypnea, or accessory muscle use.     CARDIOVASCULAR:   Heart regular, without murmurs, gallops, rubs, or thrills.    GI:  Abdomen soft, nontender, without complaint of pain to palpation.  No  hepatomegaly or splenomegaly.  MUSCULOSKELETAL:  Nails:  No clubbing or deformities.  Nailbeds pink with  prompt capillary refill.  SKIN:  Warm and dry without rashes.  NEUROLOGIC:  Alert, oriented.  Sensation intact, motor strength equal and  symmetric.  Cerebral function intact with good finger-to-nose.    ASSESSMENT:  A 64 year old female with headache times 1 week.  She normally does not have a  history of headaches.  It has been more painful than anything she has had in  the past, not sudden in onset, was gradual in onset.  The patient will have a  CT scan and an i-STAT performed.    DIAGNOSTIC STUDIES:   Stat head CT shows no acute process, per radiology.  Small left mastoid  effusion, no other acute abnormality.  An i-STAT was unremarkable for all  tested.  BUN and creatinine were 19 and 1, respectively.  Electrolytes were  all unremarkable.  Hemoglobin and hematocrit were 15.3 and 45, respectively.    EMERGENCY DEPARTMENT COURSE:  The patient was given IV medication for pain, including Benadryl and Reglan.  On reevaluation she was resting comfortably.  Results were reviewed with the  patient.  She will be discharged home.  Prescription for Fioricet and Valium  to be taken as directed.  She is to be reevaluated for worsening of symptoms.  She has a primary care provider she can follow up with.    FINAL DIAGNOSES:  1.  Acute cephalgia.  2.  Tension headache.  3.  History of hypertension.    DISPOSITION:  The patient was discharged home in stable and improved condition.      ___________________  Erlinda Hongodd Vanden Hoek MD  Dictated By: Marland Kitchen.     My signature above authenticates this document and my orders, the final  diagnosis (es), discharge prescription (s), and instructions in the [facility] record.   Nursing notes have been reviewed by the physician/mid-level provider.    If you have any questions please contact [phone].    NS  D:10/08/2014 15:56:22  T: 10/08/2014 16:07:53  [id number]  Electronically Authenticated by:  Blandon Offerdahl L. Wilfrid LundVanden Hoek, M.D. On 10/12/2014 08:29 AM EST

## 2014-11-03 ENCOUNTER — Inpatient Hospital Stay
Admit: 2014-11-03 | Discharge: 2014-11-03 | Disposition: A | Discharge status: Home / Self Care | Payer: Self-pay | Attending: Emergency Medicine

## 2014-11-03 ENCOUNTER — Emergency Department: Admit: 2014-11-03 | Payer: Self-pay | Primary: Internal Medicine

## 2014-11-03 DIAGNOSIS — J440 Chronic obstructive pulmonary disease with acute lower respiratory infection: Secondary | ICD-10-CM

## 2014-11-03 LAB — CBC WITH AUTOMATED DIFF
ABS. BASOPHILS: 0 10*3/uL (ref 0.0–0.06)
ABS. EOSINOPHILS: 0.1 10*3/uL (ref 0.0–0.4)
ABS. LYMPHOCYTES: 1.1 10*3/uL (ref 0.9–3.6)
ABS. MONOCYTES: 0.3 10*3/uL (ref 0.05–1.2)
ABS. NEUTROPHILS: 3.3 10*3/uL (ref 1.8–8.0)
BASOPHILS: 0 % (ref 0–2)
EOSINOPHILS: 1 % (ref 0–5)
HCT: 43.2 % (ref 35.0–45.0)
HGB: 13.6 g/dL (ref 12.0–16.0)
LYMPHOCYTES: 23 % (ref 21–52)
MCH: 31.3 PG (ref 24.0–34.0)
MCHC: 31.5 g/dL (ref 31.0–37.0)
MCV: 99.5 FL — ABNORMAL HIGH (ref 74.0–97.0)
MONOCYTES: 7 % (ref 3–10)
MPV: 9.9 FL (ref 9.2–11.8)
NEUTROPHILS: 69 % (ref 40–73)
PLATELET: 214 10*3/uL (ref 135–420)
RBC: 4.34 M/uL (ref 4.20–5.30)
RDW: 13.7 % (ref 11.6–14.5)
WBC: 4.8 10*3/uL (ref 4.6–13.2)

## 2014-11-03 LAB — METABOLIC PANEL, COMPREHENSIVE
A-G Ratio: 0.9 (ref 0.8–1.7)
ALT (SGPT): 18 U/L (ref 13–56)
AST (SGOT): 13 U/L — ABNORMAL LOW (ref 15–37)
Albumin: 3.7 g/dL (ref 3.4–5.0)
Alk. phosphatase: 94 U/L (ref 45–117)
Anion gap: 3 mmol/L (ref 3.0–18)
BUN/Creatinine ratio: 14 (ref 12–20)
BUN: 11 MG/DL (ref 7.0–18)
Bilirubin, total: 0.3 MG/DL (ref 0.2–1.0)
CO2: 33 mmol/L — ABNORMAL HIGH (ref 21–32)
Calcium: 8.7 MG/DL (ref 8.5–10.1)
Chloride: 104 mmol/L (ref 100–108)
Creatinine: 0.76 MG/DL (ref 0.6–1.3)
GFR est AA: >60 mL/min/{1.73_m2} (ref >60)
GFR est non-AA: >60 mL/min/{1.73_m2} (ref >60)
Globulin: 4.3 g/dL — ABNORMAL HIGH (ref 2.0–4.0)
Glucose: 69 mg/dL — ABNORMAL LOW (ref 74–99)
Potassium: 4.3 mmol/L (ref 3.5–5.5)
Protein, total: 8 g/dL (ref 6.4–8.2)
Sodium: 140 mmol/L (ref 136–145)

## 2014-11-03 LAB — EKG, 12 LEAD, INITIAL
Atrial Rate: 83 {beats}/min
Calculated P Axis: 77 degrees
Calculated R Axis: 75 degrees
Calculated T Axis: 78 degrees
Diagnosis: NORMAL
P-R Interval: 190 ms
Q-T Interval: 354 ms
QRS Duration: 70 ms
QTC Calculation (Bezet): 415 ms
Ventricular Rate: 83 {beats}/min

## 2014-11-03 LAB — CARDIAC PANEL,(CK, CKMB & TROPONIN)
CK - MB: 1.6 ng/ml (ref 0.5–3.6)
CK-MB Index: 0.8 % (ref 0.0–4.0)
CK: 195 U/L — ABNORMAL HIGH (ref 26–192)
Troponin-I, QT: <0.02 NG/ML (ref 0.0–0.045)

## 2014-11-03 LAB — LACTIC ACID: Lactic acid: 0.5 MMOL/L (ref 0.4–2.0)

## 2014-11-03 MED ORDER — PREDNISONE 20 MG TAB
20 mg | ORAL_TABLET | Freq: Every day | ORAL | Status: AC
Start: 2014-11-03 — End: 2014-11-08

## 2014-11-03 MED ORDER — IPRATROPIUM-ALBUTEROL 2.5 MG-0.5 MG/3 ML NEB SOLUTION
2.5 mg-0.5 mg/3 ml | RESPIRATORY_TRACT | Status: DC | PRN
Start: 2014-11-03 — End: 2014-11-03
  Administered 2014-11-03 (×2): via RESPIRATORY_TRACT

## 2014-11-03 MED ORDER — AZITHROMYCIN 250 MG TAB
250 mg | ORAL_TABLET | ORAL | Status: DC
Start: 2014-11-03 — End: 2015-02-18

## 2014-11-03 MED ORDER — METHYLPREDNISOLONE (PF) 125 MG/2 ML IJ SOLR
125 mg/2 mL | INTRAMUSCULAR | Status: AC
Start: 2014-11-03 — End: 2014-11-03
  Administered 2014-11-03: 17:00:00 via INTRAVENOUS

## 2014-11-03 MED ORDER — ALBUTEROL SULFATE 0.083 % (0.83 MG/ML) SOLN FOR INHALATION
2.5 mg /3 mL (0.083 %) | RESPIRATORY_TRACT | Status: DC | PRN
Start: 2014-11-03 — End: 2016-01-27

## 2014-11-03 MED FILL — IPRATROPIUM-ALBUTEROL 2.5 MG-0.5 MG/3 ML NEB SOLUTION: 2.5 mg-0.5 mg/3 ml | RESPIRATORY_TRACT | Qty: 3

## 2014-11-03 MED FILL — SOLU-MEDROL (PF) 125 MG/2 ML SOLUTION FOR INJECTION: 125 mg/2 mL | INTRAMUSCULAR | Qty: 2

## 2014-11-03 NOTE — ED Notes (Signed)
Pt states improvement in breathing after treatment administration. Pt resting comfortably with no signs of distress.

## 2014-11-03 NOTE — ED Notes (Signed)
I have reviewed discharge instructions with the patient.  The patient verbalized understanding.  Patient armband removed and shredded

## 2014-11-03 NOTE — ED Notes (Signed)
Cough, chest congestion. Left rib pain with cough. Hx COPD.

## 2014-11-03 NOTE — ED Notes (Signed)
Bedside and Verbal shift change report given to Molly Bourne RN (oncoming nurse) by Clair Holden RN (offgoing nurse). Report included the following information SBAR, ED Summary, MAR and Recent Results.

## 2014-11-03 NOTE — ED Provider Notes (Signed)
HPI Comments: 10:59AM 64 [name] with a h/o COPD presents to the ED c/o CP onset two days ago. Other associated sypmtoms include rib pain, HA, productive cough with yellow sputum, congestion, and nausea. She notes having a smoking and etoh drinking history but denies illicit drug usage. She denies vomiting, numbness, weakness, fever, chills, and has no further complaints. Pt also reports she is noncompliant with her BP meds and states she has been out of them for a while. She also states she uses an inhaler but notes she does not have the associated meds. She has no further complaints at this time.                 1:25PM Susan [name] rechk, pt's breathing is better    Susan [name] is a 64 y.o. female presenting with shortness of breath and cough. The history is provided by the Susan [name] and the nursing home.   Shortness of Breath  Associated symptoms include headaches, cough and chest pain. Pertinent negatives include no fever, no neck pain, no vomiting and no abdominal pain.   Cough  Associated symptoms include chest pain, headaches and shortness of breath. Pertinent negatives include no vomiting.        Past Medical History:   Diagnosis Date   ??? Hypertension    ??? Asthma    ??? Bipolar 1 disorder ([place])    ??? Psychiatric disorder    ??? Chronic obstructive pulmonary disease ([place])        Past Surgical History:   Procedure Laterality Date   ??? Hx heent           History reviewed. No pertinent family history.    History     Social History   ??? Marital Status: SINGLE     Spouse Name: N/A     Number of Children: N/A   ??? Years of Education: N/A     Occupational History   ??? Not on file.     Social History Main Topics   ??? Smoking status: Current Every Day Smoker   ??? Smokeless tobacco: Not on file   ??? Alcohol Use: No   ??? Drug Use: No   ??? Sexual Activity: No     Other Topics Concern   ??? Not on file     Social History Narrative           ALLERGIES: Penicillins      Review of Systems   Constitutional: Negative for fever.    HENT: Positive for congestion. Negative for trouble swallowing.    Respiratory: Positive for cough and shortness of breath.    Cardiovascular: Positive for chest pain.   Gastrointestinal: Negative for vomiting, abdominal pain and diarrhea.   Genitourinary: Negative for difficulty urinating.   Musculoskeletal: Positive for arthralgias (rib pain). Negative for back pain and neck pain.   Skin: Negative for wound.   Neurological: Positive for headaches. Negative for syncope.   Psychiatric/Behavioral: Negative for behavioral problems.   All other systems reviewed and are negative.      Filed Vitals:    11/03/14 1032 11/03/14 1100 11/03/14 1114   BP: 170/115     Pulse: 88     Temp: 98.1 ??F (36.7 ??C)     Resp: 15     Weight: 79.833 kg (176 lb)     SpO2: 92% 89% 99%            Physical Exam   Constitutional: She is oriented to person, place, and time. She appears  well-developed and well-nourished. No distress.   Difficulty speaking full sentences   HENT:   Head: Normocephalic and atraumatic.   Mouth/Throat: Oropharynx is clear and moist.   Eyes: Conjunctivae and EOM are normal. Pupils are equal, round, and reactive to light. No scleral icterus.   Neck: Normal range of motion. Neck supple.   Cardiovascular: Normal rate, regular rhythm and normal heart sounds.    No murmur heard.  Increased HR   Pulmonary/Chest: Breath sounds normal. Accessory muscle usage present. She is in respiratory distress (mild).   Abdominal: Soft. Bowel sounds are normal. She exhibits no distension. There is tenderness (mild) in the epigastric area.   Musculoskeletal: She exhibits no edema.   Lymphadenopathy:     She has no cervical adenopathy.   Neurological: She is alert and oriented to person, place, and time. Coordination normal.   Skin: Skin is warm and dry. No rash noted.   Psychiatric: She has a normal mood and affect. Her behavior is normal.   Nursing note and vitals reviewed.       MDM  Number of Diagnoses or Management Options   Acute bronchitis with bronchospasm:   Diagnosis management comments: Cough productive greenish sputum with exp ins wheeze mild resp distress mild chest wall tenderness    Ekg: no acute changes    cxr neg in Ed     2:17 PM improved no resp distress no wheeze        Amount and/or Complexity of Data Reviewed  Clinical lab tests: ordered and reviewed  Tests in the radiology section of CPT??: ordered and reviewed  Independent visualization of images, tracings, or specimens: yes    Risk of Complications, Morbidity, and/or Mortality  Presenting problems: high  Diagnostic procedures: moderate  Management options: moderate        EKG    Date/Time: 11/03/2014 11:38 AM  Performed by: Threasa Beards  Authorized by: Threasa Beards  Interpreted by ED physician  Rhythm: sinus rhythm  Rate: normal  BPM: 83  ST segment flattening noted on lead: nonspecific st changes         -------------------------------------------------------------------------------------------------------------------  PROGRESS NOTE  1:25 PM On re-evaluation, pt's breathing improved, with no wheezing present.     CONSULTATIONS:      ORDERS:  Orders Placed This Encounter   ??? CULTURE, BLOOD   ??? CULTURE, BLOOD   ??? XR CHEST PA LAT   ??? CBC WITH AUTOMATED DIFF   ??? METABOLIC PANEL, COMPREHENSIVE   ??? CARDIAC PANEL,(CK, CKMB & TROPONIN)   ??? LACTIC ACID, PLASMA   ??? EKG NOTEWRITER(ASAP ONLY)   ??? EKG, 12 LEAD, INITIAL   ??? albuterol (PROVENTIL HFA, VENTOLIN HFA, PROAIR HFA) 90 mcg/actuation inhaler   ??? venlafaxine-SR (EFFEXOR-XR) 150 mg capsule   ??? venlafaxine-SR (EFFEXOR-XR) 75 mg capsule   ??? OLANZapine (ZYPREXA) 10 mg tablet   ??? albuterol-ipratropium (DUO-NEB) 2.5 MG-0.5 MG/3 ML   ??? methylPREDNISolone (PF) (SOLU-MEDROL) injection 125 mg   ??? albuterol (PROVENTIL VENTOLIN) 2.5 mg /3 mL (0.083 %) nebulizer solution   ??? azithromycin (ZITHROMAX Z-PAK) 250 mg tablet   ??? predniSONE (DELTASONE) 20 mg tablet         EKG INTERPRETATIONS:      RADIOLOGY RESULTS:  XR CHEST PA LAT    Final Result ??  No acute cardiopulmonary disease.interpreted by RAD           LAB RESULTS:  Recent Results (from the past 12 hour(s))   EKG, 12 LEAD,  INITIAL    Collection Time: 11/03/14 10:53 AM   Result Value Ref Range    Ventricular Rate 83 BPM    Atrial Rate 83 BPM    P-R Interval 190 ms    QRS Duration 70 ms    Q-T Interval 354 ms    QTC Calculation (Bezet) 415 ms    Calculated P Axis 77 degrees    Calculated R Axis 75 degrees    Calculated T Axis 78 degrees    Diagnosis       Normal sinus rhythm  Nonspecific T wave abnormality  Abnormal ECG  When compared with ECG of 23-Nov-2011 19:38,  No significant change was found     CBC WITH AUTOMATED DIFF    Collection Time: 11/03/14 11:40 AM   Result Value Ref Range    WBC 4.8 4.6 - 13.2 K/uL    RBC 4.34 4.20 - 5.30 M/uL    HGB 13.6 12.0 - 16.0 g/dL    HCT 43.2 35.0 - 45.0 %    MCV 99.5 (H) Susan.0 - 97.0 FL    MCH 31.3 24.0 - 34.0 PG    MCHC 31.5 31.0 - 37.0 g/dL    RDW 13.7 11.6 - 14.5 %    PLATELET 214 135 - 420 K/uL    MPV 9.9 9.2 - 11.8 FL    NEUTROPHILS 69 40 - 73 %    LYMPHOCYTES 23 21 - 52 %    MONOCYTES 7 3 - 10 %    EOSINOPHILS 1 0 - 5 %    BASOPHILS 0 0 - 2 %    ABS. NEUTROPHILS 3.3 1.8 - 8.0 K/UL    ABS. LYMPHOCYTES 1.1 0.9 - 3.6 K/UL    ABS. MONOCYTES 0.3 0.05 - 1.2 K/UL    ABS. EOSINOPHILS 0.1 0.0 - 0.4 K/UL    ABS. BASOPHILS 0.0 0.0 - 0.06 K/UL    DF AUTOMATED     METABOLIC PANEL, COMPREHENSIVE    Collection Time: 11/03/14 11:40 AM   Result Value Ref Range    Sodium 140 136 - 145 mmol/L    Potassium 4.3 3.5 - 5.5 mmol/L    Chloride 104 100 - 108 mmol/L    CO2 33 (H) 21 - 32 mmol/L    Anion gap 3 3.0 - 18 mmol/L    Glucose 69 (L) Susan - 99 mg/dL    BUN 11 7.0 - 18 MG/DL    Creatinine 0.76 0.6 - 1.3 MG/DL    BUN/Creatinine ratio 14 12 - 20      GFR est AA >60 >60 ml/min/1.92m    GFR est non-AA >60 >60 ml/min/1.728m   Calcium 8.7 8.5 - 10.1 MG/DL    Bilirubin, total 0.3 0.2 - 1.0 MG/DL    ALT 18 13 - 56 U/L    AST 13 (L) 15 - 37 U/L     Alk. phosphatase 94 45 - 117 U/L    Protein, total 8.0 6.4 - 8.2 g/dL    Albumin 3.7 3.4 - 5.0 g/dL    Globulin 4.3 (H) 2.0 - 4.0 g/dL    A-G Ratio 0.9 0.8 - 1.7     CARDIAC PANEL,(CK, CKMB & TROPONIN)    Collection Time: 11/03/14 11:40 AM   Result Value Ref Range    CK 195 (H) 26 - 192 U/L    CK - MB 1.6 0.5 - 3.6 ng/ml    CK-MB Index 0.8 0.0 - 4.0 %    Troponin-I,  Qt. <0.02 0.0 - 0.045 NG/ML   LACTIC ACID, PLASMA    Collection Time: 11/03/14 11:45 AM   Result Value Ref Range    Lactic acid 0.5 0.4 - 2.0 MMOL/L        DISPOSITION:  Diagnosis:   1. Acute bronchitis with bronchospasm          Disposition: discharge     Follow-up Information     Follow up With Details Comments Contact Info    [facility] EMERGENCY DEPT  As needed, If symptoms worsen [place] [address]  [phone]    Angela Burke, MD Schedule an appointment as soon as possible for a visit for ED follow up  [facility] [place] [address]  [phone]            Current Discharge Medication List      START taking these medications    Details   albuterol (PROVENTIL VENTOLIN) 2.5 mg /3 mL (0.083 %) nebulizer solution 3 mL by Nebulization route every four (4) hours as needed for Wheezing.  Qty: 30 Each, Refills: 0      azithromycin (ZITHROMAX Z-PAK) 250 mg tablet Take 1st dose 24 hours after initial dose that was given in the ER.  Then take 1 tablet daily until finished.  Qty: 4 Tab, Refills: 0      predniSONE (DELTASONE) 20 mg tablet Take 2 Tabs by mouth daily for 5 days. With Breakfast  Qty: 10 Tab, Refills: 0         CONTINUE these medications which have NOT CHANGED    Details   albuterol (PROVENTIL HFA, VENTOLIN HFA, PROAIR HFA) 90 mcg/actuation inhaler Take  by inhalation.      venlafaxine-SR (EFFEXOR-XR) 150 mg capsule Take  by mouth daily.      OLANZapine (ZYPREXA) 10 mg tablet Take 10 mg by mouth nightly.              --------------------------------------------------------------------------- ----------------------------------------    SCRIBE ATTESTATION  Acquilla Macklin acting as scribes for and in the presence of Dr. Nyoka Cowden 12:00 PM     Provider Attestation:   I personally performed the services described in the documentation, reviewed the documentation, as recorded by the scribe in my presence, and it accurately and completely records my words and actions.   Reviewed and signed by: Park Meo, MD

## 2014-11-09 LAB — CULTURE, BLOOD: Culture result:: NO GROWTH

## 2015-02-18 ENCOUNTER — Ambulatory Visit
Admit: 2015-02-18 | Discharge: 2015-02-18 | Payer: PRIVATE HEALTH INSURANCE | Attending: Internal Medicine | Primary: Internal Medicine

## 2015-02-18 DIAGNOSIS — M545 Low back pain, unspecified: Secondary | ICD-10-CM

## 2015-02-18 MED ORDER — MELOXICAM 7.5 MG TAB
7.5 mg | ORAL_TABLET | ORAL | Status: DC
Start: 2015-02-18 — End: 2015-07-26

## 2015-02-18 NOTE — Patient Instructions (Addendum)
1) follow-up in 1 month or sooner if worsening symptoms    2) use heating pad, icy/hot, tiger balm for pain    3) take lisinopril ASAP.     4) Take mobic With food. If you notice any blood/black tarry stools, diarrhea, abdominal pain, nausea, vomiting then stop medication immediately. Give us a call ASAP.  Low Back Pain: Exercises  Your Care Instructions  Here are some examples of typical rehabilitation exercises for your condition. Start each exercise slowly. Ease off the exercise if you start to have pain.  Your doctor or physical therapist will tell you when you can start these exercises and which ones will work best for you.  How to do the exercises  Press-up    1. Lie on your stomach, supporting your body with your forearms.  2. Press your elbows down into the floor to raise your upper back. As you do this, relax your stomach muscles and allow your back to arch without using your back muscles. As your press up, do not let your hips or pelvis come off the floor.  3. Hold for 15 to 30 seconds, then relax.  4. Repeat 2 to 4 times.  Alternate arm and leg (bird dog) exercise    Note: Do this exercise slowly. Try to keep your body straight at all times, and do not let one hip drop lower than the other.  1. Start on the floor, on your hands and knees.  2. Tighten your belly muscles.  3. Raise one leg off the floor, and hold it straight out behind you. Be careful not to let your hip drop down, because that will twist your trunk.  4. Hold for about 6 seconds, then lower your leg and switch to the other leg.  5. Repeat 8 to 12 times on each leg.  6. Over time, work up to holding for 10 to 30 seconds each time.  7. If you feel stable and secure with your leg raised, try raising the opposite arm straight out in front of you at the same time.  Knee-to-chest exercise    1. Lie on your back with your knees bent and your feet flat on the floor.  2. Bring one knee to your chest, keeping the other foot flat on the floor  (or keeping the other leg straight, whichever feels better on your lower back).  3. Keep your lower back pressed to the floor. Hold for at least 15 to 30 seconds.  4. Relax, and lower the knee to the starting position.  5. Repeat with the other leg. Repeat 2 to 4 times with each leg.  6. To get more stretch, put your other leg flat on the floor while pulling your knee to your chest.  Curl-ups    1. Lie on the floor on your back with your knees bent at a 90-degree angle. Your feet should be flat on the floor, about 12 inches from your buttocks.  2. Cross your arms over your chest. If this bothers your neck, try putting your hands behind your neck (not your head), with your elbows spread apart.  3. Slowly tighten your belly muscles and raise your shoulder blades off the floor.  4. Keep your head in line with your body, and do not press your chin to your chest.  5. Hold this position for 1 or 2 seconds, then slowly lower yourself back down to the floor.  6. Repeat 8 to 12 times.  Pelvic tilt exercise  1. Lie on your back with your knees bent.  2. "Brace" your stomach. This means to tighten your muscles by pulling in and imagining your belly button moving toward your spine. You should feel like your back is pressing to the floor and your hips and pelvis are rocking back.  3. Hold for about 6 seconds while you breathe smoothly.  4. Repeat 8 to 12 times.  Heel dig bridging    1. Lie on your back with both knees bent and your ankles bent so that only your heels are digging into the floor. Your knees should be bent about 90 degrees.  2. Then push your heels into the floor, squeeze your buttocks, and lift your hips off the floor until your shoulders, hips, and knees are all in a straight line.  3. Hold for about 6 seconds as you continue to breathe normally, and then slowly lower your hips back down to the floor and rest for up to 10 seconds.  4. Do 8 to 12 repetitions.  Hamstring stretch in doorway     1. Lie on your back in a doorway, with one leg through the open door.  2. Slide your leg up the wall to straighten your knee. You should feel a gentle stretch down the back of your leg.  3. Hold the stretch for at least 15 to 30 seconds. Do not arch your back, point your toes, or bend either knee. Keep one heel touching the floor and the other heel touching the wall.  4. Repeat with your other leg.  5. Do 2 to 4 times for each leg.  Hip flexor stretch    1. Kneel on the floor with one knee bent and one leg behind you. Place your forward knee over your foot. Keep your other knee touching the floor.  2. Slowly push your hips forward until you feel a stretch in the upper thigh of your rear leg.  3. Hold the stretch for at least 15 to 30 seconds. Repeat with your other leg.  4. Do 2 to 4 times on each side.  Wall sit    1. Stand with your back 10 to 12 inches away from a wall.  2. Lean into the wall until your back is flat against it.  3. Slowly slide down until your knees are slightly bent, pressing your lower back into the wall.  4. Hold for about 6 seconds, then slide back up the wall.  5. Repeat 8 to 12 times.  Follow-up care is a key part of your treatment and safety. Be sure to make and go to all appointments, and call your doctor if you are having problems. It's also a good idea to know your test results and keep a list of the medicines you take.   Where can you learn more?   Go to [email or web address].[email or web address]  Enter [phone] in the search box to learn more about "Low Back Pain: Exercises."   ?? 2006-2016 Healthwise, Incorporated. Care instructions adapted under license by [employer] (which disclaims liability or warranty for this information). This care instruction is for use with your licensed healthcare professional. If you have questions about a medical condition or this instruction, always ask your healthcare professional. Healthwise, Incorporated disclaims any warranty or liability for your use of this  information.  Content Version: 10.8.513193; Current as of: Feb 20, 2014        Low Back Arthritis: Exercises  Your Care Instructions  Here are some examples of  typical rehabilitation exercises for your condition. Start each exercise slowly. Ease off the exercise if you start to have pain.  Your doctor or physical therapist will tell you when you can start these exercises and which ones will work best for you.  When you are not being active, find a comfortable position for rest. Some people are comfortable on the floor or a medium-firm bed with a small pillow under their head and another under their knees. Some people prefer to lie on their side with a pillow between their knees. Don't stay in one position for too long.  Take short walks (10 to 20 minutes) every 2 to 3 hours. Avoid slopes, hills, and stairs until you feel better. Walk only distances you can manage without pain, especially leg pain.  How to do the exercises  Pelvic tilt    5. Lie on your back with your knees bent.  6. "Brace" your stomach???tighten your muscles by pulling in and imagining your belly button moving toward your spine.  7. Press your lower back into the floor. You should feel your hips and pelvis rock back.  8. Hold for 6 seconds while breathing smoothly.  9. Relax and allow your pelvis and hips to rock forward.  10. Repeat 8 to 12 times.  Back stretches    8. Get down on your hands and knees on the floor.  9. Relax your head and allow it to droop. Round your back up toward the ceiling until you feel a nice stretch in your upper, middle, and lower back. Hold this stretch for as long as it feels comfortable, or about 15 to 30 seconds.  10. Return to the starting position with a flat back while you are on your hands and knees.  11. Let your back sway by pressing your stomach toward the floor. Lift your buttocks toward the ceiling.  12. Hold this position for 15 to 30 seconds.  13. Repeat 2 to 4 times.   Follow-up care is a key part of your treatment and safety. Be sure to make and go to all appointments, and call your doctor if you are having problems. It's also a good idea to know your test results and keep a list of the medicines you take.   Where can you learn more?   Go to [employer]  Enter T094 in the search box to learn more about "Low Back Arthritis: Exercises."   ?? 2006-2016 Healthwise, Inc[employer]. Care instructions adapted under license by [employer] (which disclaims liability or warranty for this information). This care instruction is for use with your licensed healthcare professional. If you have questions about a medical condition or this instruction, always ask your healthcare professional. Healthwise, Incorporated disclaims any warranty or liability for your use of this information.  Content Version: 10.8.513193; Current as of: Feb 20, 2014

## 2015-02-18 NOTE — Progress Notes (Signed)
ROOM # 1    Pt presents today for pain to both legs.  Pt reports OTC medications have not been helping.    Pt preferred language for health care discussion is english.    Is someone accompanying this pt? No    Is the patient using any DME equipment during OV? No    Depression Screening completed. Yes    Learning Assessment completed. Yes    Abuse Screening completed. Yes    Health Maintenance reviewed and discussed per provider. Yes    Advance Directive:  1. Do you have an advance directive in place? Patient Reply: No  2. If not, would you like material regarding how to put one in place? Patient Reply: No    Coordination of Care:  1. Have you been to the ER, urgent care clinic since your last visit?  Hospitalized since your last visit? Yes last month for chest pain and leg pain [facility]    2. Have you seen or consulted any other health care providers outside of the [facility] since your last visit? Include any pap smears or colon screening. No

## 2015-02-18 NOTE — Progress Notes (Signed)
Chief Complaint   Patient presents with   ??? Leg Pain         HPI:     Susan [name] is a 64 y.o. African American female with history of hypertension and asthma  Here for the above complaint. She says that she has bilateral leg pain and burning that comes and go. This has been going on for 1 week. She has been taking tylenol and excedrin without relief. She has lower back pain that radiates down the both legs. She said that one time she had pain in her back and then radiated down the left hip and groin area. She has not had any imaging of her back. Pain scale: 8/10. It is a sharp stabbing pain. She denies any dysuria, hematuria, increase urgency and frequency. She denies any trauma to her back.             Past Medical History   Diagnosis Date   ??? Hypertension    ??? Asthma    ??? Bipolar 1 disorder (HCC)      being followed by Dr. Donnajean LopesHuma Hyder   ??? Chronic obstructive pulmonary disease [facility](HCC)        Past Surgical History   Procedure Laterality Date   ??? Hx heent             MEDICATION ALLERGIES/INTOLERANCES:   Allergies   Allergen Reactions   ??? Penicillins Other (comments)     States unknown reaction              CURRENT MEDICATIONS:    Current Outpatient Prescriptions   Medication Sig   ??? lisinopril (PRINIVIL, ZESTRIL) 10 mg tablet Take 10 mg by mouth daily.   ??? venlafaxine-SR (EFFEXOR-XR) 75 mg capsule Take 75 mg by mouth daily.   ??? meloxicam (MOBIC) 7.5 mg tablet Take 1-2 po daily prn pain   ??? albuterol (PROVENTIL HFA, VENTOLIN HFA, PROAIR HFA) 90 mcg/actuation inhaler Take  by inhalation. Take 1-2 puffs every 4-6 hrs prn shortness of breath   ??? venlafaxine-SR (EFFEXOR-XR) 150 mg capsule Take 150 mg by mouth daily.   ??? OLANZapine (ZYPREXA) 10 mg tablet Take 10 mg by mouth nightly.   ??? albuterol (PROVENTIL VENTOLIN) 2.5 mg /3 mL (0.083 %) nebulizer solution 3 mL by Nebulization route every four (4) hours as needed for Wheezing.     No current facility-administered medications for this visit.        Health Maintenance   Topic Date Due   ??? Hepatitis C Screening  1951/03/27   ??? Pneumococcal 19-64 Medium Risk (1 of 1 - PPSV23) 07/19/1971   ??? DTaP/Tdap/Td series (1 - Tdap) 07/18/1973   ??? PAP AKA CERVICAL CYTOLOGY  07/18/1973   ??? BREAST CANCER SCRN MAMMOGRAM  07/18/2002   ??? FOBT Q 1 YEAR AGE 62-75  07/18/2002   ??? ZOSTER VACCINE AGE 84>  07/18/2012   ??? INFLUENZA AGE 18 TO ADULT  05/03/2015         FAMILY HISTORY:   History reviewed. No pertinent family history.    SOCIAL HISTORY:   She  reports that she has been smoking.  She does not have any smokeless tobacco history on file.  She  reports that she does not drink alcohol.        OBJECTIVE:  PHYSICAL EXAM: Vitals:   Filed Vitals:    02/18/15 0808 02/18/15 0822   BP: 179/105 170/90   Pulse: 72    Temp: 97.4 ??F (36.3 ??C)  TempSrc: Oral    Resp: 16    Height: 5\' 6"  (1.676 m)    Weight: 182 lb (82.555 kg)    SpO2: 94%      Generally: Pleasant female in no acute distress    HEENT exam: Head: atraumatic     Eyes: Pupils equally round and reactive to light, Fundoscopic exam is                       normal               Ears: bilaterally: Normal tympanic membrane, no erythema or exudate,                         normal light Reflex    Nares: moist mucosa, no erythema               Mouth: clear, no erythema or exudate     Neck: supple, no lymphadenopathy, negative thyromegaly, negative                                   carotid bruits bilaterally    Cardiac exam: regular, rate, and rhythm. No murmurs, gallops, or rubs. Normal S1 and S2.    Pulmonary exam: Clear to ausculation bilaterally    Abdominal exam: Positive bowel sounds in all four quadrants, soft, nondistended, nontender. No hepatosplenomegaly.    Extremities: 2+ dorsalis pedis bilaterally. No pedal edema bilaterally.    Musculoskeletal exam: 5 out of 5 strength in upper and lower extremities bilaterally.  Good range of motion in upper and lower extremities. 2 out  of 2 reflexes in upper and lower extremities bilaterally.    Neurological exam: Cranial nerves II-XII all intact. Normal sensation in upper and lower extremities. Normal gait.    SLR positive on the right and negative on the left.    Low back exam: TTP in the lower back area       LABS/RADIOLOGICAL TESTS:   none    ASSESSMENT/PLAN:      ICD-10-CM ICD-9-CM    1. Bilateral low back pain without sciatica, unspecified chronicity M54.5 724.2 XR SPINE LUMB COMP W BEND      meloxicam (MOBIC) 7.5 mg tablet    Take mobic With food. If you notice any blood/black tarry stools, diarrhea, abdominal pain, nausea, vomiting then stop medication immediately. Give us a call ASAP.  Use heating pad, icy/hot, tiger balm for pain. Given exercises.          2. Benign hypertension without CHF I10 401.1 Take lisinopril ASAP/    3.   Requested Prescriptions     Signed Prescriptions Disp Refills   ??? meloxicam (MOBIC) 7.5 mg tablet 30 Tab 1     Sig: Take 1-2 po daily prn pain     4. Patient verbalized understanding and agreement with the plan.    5. Patient was given after visit summary.    6. Follow-up Disposition:  Return in about 1 month (around 03/21/2015) for for COPD/HTN needs 30 minute appt.. or sooner if worsening symptoms.        Marsh DollySemret Rupinder Livingston, MD

## 2015-03-22 ENCOUNTER — Encounter: Attending: Internal Medicine | Primary: Internal Medicine

## 2015-07-26 ENCOUNTER — Ambulatory Visit
Admit: 2015-07-26 | Discharge: 2015-07-26 | Payer: PRIVATE HEALTH INSURANCE | Attending: Internal Medicine | Primary: Internal Medicine

## 2015-07-26 ENCOUNTER — Encounter: Admit: 2015-07-26 | Primary: Internal Medicine

## 2015-07-26 DIAGNOSIS — G8929 Other chronic pain: Secondary | ICD-10-CM

## 2015-07-26 MED ORDER — OXYCODONE-ACETAMINOPHEN 5 MG-325 MG TAB
5-325 mg | ORAL_TABLET | Freq: Three times a day (TID) | ORAL | 0 refills | Status: DC | PRN
Start: 2015-07-26 — End: 2015-07-30

## 2015-07-26 MED ORDER — PRAVASTATIN 20 MG TAB
20 mg | ORAL_TABLET | Freq: Every day | ORAL | 3 refills | Status: DC
Start: 2015-07-26 — End: 2016-01-13

## 2015-07-26 MED ORDER — MELOXICAM 7.5 MG TAB
7.5 mg | ORAL_TABLET | ORAL | 0 refills | Status: DC
Start: 2015-07-26 — End: 2015-07-26

## 2015-07-26 NOTE — Patient Instructions (Addendum)
1) follow-up in 1 month or sooner if worsening symptoms    2) use heating pad, icy/hot, tiger balm for pain    3) don't take percocet while driving or operating heavy machinery      Knee Arthritis: Exercises  Your Care Instructions  Here are some examples of exercises for knee arthritis. Start each exercise slowly. Ease off the exercise if you start to have pain.  Your doctor or physical therapist will tell you when you can start these exercises and which ones will work best for you.  How to do the exercises  Knee flexion with heel slide    1. Lie on your back with your knees bent.  2. Slide your heel back by bending your affected knee as far as you can. Then hook your other foot around your ankle to help pull your heel even farther back.  3. Hold for about 6 seconds, then rest for up to 10 seconds.  4. Repeat 8 to 12 times.  5. Switch legs and repeat steps 1 through 4, even if only one knee is sore.  Quad sets    1. Sit with your affected leg straight and supported on the floor or a firm bed. Place a small, rolled-up towel under your knee. Your other leg should be bent, with that foot flat on the floor.  2. Tighten the thigh muscles of your affected leg by pressing the back of your knee down into the towel.  3. Hold for about 6 seconds, then rest for up to 10 seconds.  4. Repeat 8 to 12 times.  5. Switch legs and repeat steps 1 through 4, even if only one knee is sore.  Straight-leg raises to the front    1. Lie on your back with your good knee bent so that your foot rests flat on the floor. Your affected leg should be straight. Make sure that your low back has a normal curve. You should be able to slip your hand in between the floor and the small of your back, with your palm touching the floor and your back touching the back of your hand.  2. Tighten the thigh muscles in your affected leg by pressing the back of your knee flat down to the floor. Hold your knee straight.   3. Keeping the thigh muscles tight and your leg straight, lift your affected leg up so that your heel is about 12 inches off the floor. Hold for about 6 seconds, then lower slowly.  4. Relax for up to 10 seconds between repetitions.  5. Repeat 8 to 12 times.  6. Switch legs and repeat steps 1 through 5, even if only one knee is sore.  Active knee flexion    1. Lie on your stomach with your knees straight. If your kneecap is uncomfortable, roll up a washcloth and put it under your leg just above your kneecap.  2. Lift the foot of your affected leg by bending the knee so that you bring the foot up toward your buttock. If this motion hurts, try it without bending your knee quite as far. This may help you avoid any painful motion.  3. Slowly move your leg up and down.  4. Repeat 8 to 12 times.  5. Switch legs and repeat steps 1 through 4, even if only one knee is sore.  Quadriceps stretch (facedown)    1. Lie flat on your stomach, and rest your face on the floor.  2. Wrap a towel  or belt strap around the lower part of your affected leg. Then use the towel or belt strap to slowly pull your heel toward your buttock until you feel a stretch.  3. Hold for about 15 to 30 seconds, then relax your leg against the towel or belt strap.  4. Repeat 2 to 4 times.  5. Switch legs and repeat steps 1 through 4, even if only one knee is sore.  Stationary exercise bike    If you do not have a stationary exercise bike at home, you can find one to ride at your local health club or community center.  1. Adjust the height of the bike seat so that your knee is slightly bent when your leg is extended downward. If your knee hurts when the pedal reaches the top, you can raise the seat so that your knee does not bend as much.  2. Start slowly. At first, try to do 5 to 10 minutes of cycling with little to no resistance. Then increase your time and the resistance bit by bit until you can do 20 to 30 minutes without pain.   3. If you start to have pain, rest your knee until your pain gets back to the level that is normal for you. Or cycle for less time or with less effort.  Follow-up care is a key part of your treatment and safety. Be sure to make and go to all appointments, and call your doctor if you are having problems. It's also a good idea to know your test results and keep a list of the medicines you take.  Where can you learn more?  Go to [employer]Stats[employer]cahttp://www.healthwise.net/[employer]  Enter C159 in the search box to learn more about "Knee Arthritis: Exercises."  ?? 2006-2016 [employer]. Care instructions adapted under license by [employer] (which disclaims liability or warranty for this information). This care instruction is for use with your licensed healthcare professional. If you have questions about a medical condition or this instruction, always ask your healthcare professional. Healthwise, Incorporated disclaims any warranty or liability for your use of this information.  Content Version: 11.0.578772; Current as of: Feb 22, 2015

## 2015-07-26 NOTE — Progress Notes (Signed)
ROOM # 1  Requested Prescriptions     Pending Prescriptions Disp Refills   ??? pravastatin (PRAVACHOL) 20 mg tablet           Susan [name] presents today for   Chief Complaint   Patient presents with   ??? Leg Pain     bil. shooting pain/ int.numbness       Susan [name] preferred language for health care discussion is english/other.    Is someone accompanying this pt? no    Is the patient using any DME equipment during OV? no    Depression Screening:  PHQ 2 / 9, over the last two weeks 02/18/2015   Little interest or pleasure in doing things Not at all   Feeling down, depressed or hopeless Not at all   Total Score PHQ 2 0       Learning Assessment:  Learning Assessment 02/18/2015   PRIMARY LEARNER Patient   HIGHEST LEVEL OF EDUCATION - PRIMARY LEARNER  DID NOT GRADUATE HIGH SCHOOL   BARRIERS PRIMARY LEARNER NONE   CO-LEARNER CAREGIVER No   PRIMARY LANGUAGE ENGLISH   INTERPRETER NEED No   LEARNER PREFERENCE PRIMARY DEMONSTRATION   LEARNING SPECIAL TOPICS no   ANSWERED BY patient   RELATIONSHIP SELF   ASSESSMENT COMMENT none       Abuse Screening:  No flowsheet data found.    Fall Risk  No flowsheet data found.    Health Maintenance reviewed and discussed per provider. Yes    Susan [name] is due for multiple, see HM due.  Please order/place referral if appropriate.      Advance Directive:  1. Do you have an advance directive in place? Patient Reply: no    2. If not, would you like material regarding how to put one in place? Patient Reply: no    Coordination of Care:  1. Have you been to the ER, urgent care clinic since your last visit?  [facility]    2. Have you seen or consulted any other health care providers [facility] since your last visit? Include any pap smears or colon screening. no

## 2015-07-26 NOTE — Progress Notes (Signed)
Chief Complaint   Patient presents with   ??? Leg Pain     bil. shooting pain/ int.numbness       HPI:     Susan [name] is a 64 y.o. African American female with history of  Hypertension and dyslipidemia  here for the above complaint. She is having bilateral knee pain for 6 months. She has trauma on left knee. She has numbness and tingling in both legs and radiation of pain down both legs. Sometimes constant, other times off and pain. Pain scale: 8-9/10. She has been taking percocet for the pain. She describes the pain as a sharp stabbing and other times "pulling" sensation. Patient denies any chest pain, shortness of breath, abdominal pain, dizziness. She has headaches sometimes.             Past Medical History   Diagnosis Date   ??? Asthma    ??? Bipolar 1 disorder (HCC)      being followed by Dr. Donnajean Lopes   ??? Chronic obstructive pulmonary disease (HCC)    ??? Dyslipidemia    ??? Hypertension      Past Surgical History   Procedure Laterality Date   ??? Hx heent       Current Outpatient Prescriptions   Medication Sig   ??? pravastatin (PRAVACHOL) 20 mg tablet Take 1 Tab by mouth daily.   ??? oxyCODONE-acetaminophen (PERCOCET) 5-325 mg per tablet Take 1 Tab by mouth every eight (8) hours as needed for Pain. Max Daily Amount: 3 Tabs.   ??? lisinopril (PRINIVIL, ZESTRIL) 10 mg tablet Take 10 mg by mouth daily.   ??? venlafaxine-SR (EFFEXOR-XR) 75 mg capsule Take 75 mg by mouth daily.   ??? albuterol (PROVENTIL HFA, VENTOLIN HFA, PROAIR HFA) 90 mcg/actuation inhaler Take  by inhalation. Take 1-2 puffs every 4-6 hrs prn shortness of breath   ??? venlafaxine-SR (EFFEXOR-XR) 150 mg capsule Take 150 mg by mouth daily.   ??? OLANZapine (ZYPREXA) 10 mg tablet Take 10 mg by mouth nightly.   ??? albuterol (PROVENTIL VENTOLIN) 2.5 mg /3 mL (0.083 %) nebulizer solution 3 mL by Nebulization route every four (4) hours as needed for Wheezing.     No current facility-administered medications for this visit.      Health Maintenance   Topic Date Due    ??? Hepatitis C Screening  03-16-1951   ??? Pneumococcal 19-64 Medium Risk (1 of 1 - PPSV23) 07/19/1971   ??? DTaP/Tdap/Td series (1 - Tdap) 07/18/1973   ??? PAP AKA CERVICAL CYTOLOGY  07/18/1973   ??? BREAST CANCER SCRN MAMMOGRAM  07/18/2002   ??? FOBT Q 1 YEAR AGE 68-75  07/18/2002   ??? ZOSTER VACCINE AGE 51>  07/18/2012   ??? INFLUENZA AGE 35 TO ADULT  Addressed       There is no immunization history on file for this patient.  No LMP recorded. Patient is postmenopausal.        Allergies and Intolerances:   Allergies   Allergen Reactions   ??? Penicillins Other (comments)     States unknown reaction        Family History:   History reviewed. No pertinent family history.    Social History:   She  reports that she has been smoking.  She does not have any smokeless tobacco history on file.  She  reports that she does not drink alcohol.            ??     OBJECTIVE:  Physical exam:   Visit Vitals   ??? BP 140/80 (BP 1 Location: Left arm, BP Patient Position: Sitting)  Comment (BP Patient Position): in pain right now.   ??? Pulse 82   ??? Temp 97.5 ??F (36.4 ??C) (Oral)   ??? Resp 16   ??? Ht 5\' 6"  (1.676 m)   ??? Wt 172 lb 6.4 oz (78.2 kg)   ??? SpO2 100%   ??? BMI 27.83 kg/m2        Generally: Pleasant female in no acute distress  Cardiac Exam: regular, rate, and rhythm. Normal S1 and S2. No murmurs, gallops, or rubs  Pulmonary exam: Clear to auscultation bilaterally  Abdominal exam: Positive bowel sounds in all four quadrants, soft, nondistended, nontender  Extremities: 2+ dorsalis pedis pulses bilaterally. No pedal edema    bilaterally  Bilateral knee exam: Left knee: Tenderness to palpation of left knee, good ROM with crepitus.   Right knee: slight crepitus, but good ROM  5/5 strength in lower extremities bilaterally  2/2 reflexes in lower extremities bilaterally  Normal sensation in both lower legs bilaterally    LABS/RADIOLOGICAL TESTS:  Lab Results   Component Value Date/Time    WBC 4.8 11/03/2014 11:40 AM    HGB 13.6 11/03/2014 11:40 AM     HCT 43.2 11/03/2014 11:40 AM    PLATELET 214 11/03/2014 11:40 AM     Lab Results   Component Value Date/Time    SODIUM 140 11/03/2014 11:40 AM    POTASSIUM 4.3 11/03/2014 11:40 AM    CHLORIDE 104 11/03/2014 11:40 AM    CO2 33 11/03/2014 11:40 AM    CO2, TOTAL 29 10/08/2014 01:00 PM    GLUCOSE 69 11/03/2014 11:40 AM    BUN 11 11/03/2014 11:40 AM    CREATININE 0.76 11/03/2014 11:40 AM     No results found for: CHOL, CHOLX, CHLST, CHOLV, HDL, LDL, DLDL, LDLC, DLDLP, TGL, TGLX, TRIGL, TRIGP  No results found for: GPT    Previous labs    ASSESSMENT/PLAN:    1. Chronic pain of both knees: think this is due to arthritis. She did not want the mobic. She was told not to take the percocet while driving or operating heavy machinery. Use heating pad, icy/hot, tiger balm for pain. Give exercises.   -     XR KNEES BI AP LAT; Future  -     oxyCODONE-acetaminophen (PERCOCET) 5-325 mg per tablet; Take 1 Tab by mouth every eight (8) hours as needed for Pain. Max Daily Amount: 3 Tabs.    2. Medication refill  -     pravastatin (PRAVACHOL) 20 mg tablet; Take 1 Tab by mouth daily.      3.   Requested Prescriptions     Signed Prescriptions Disp Refills   ??? pravastatin (PRAVACHOL) 20 mg tablet 30 Tab 3     Sig: Take 1 Tab by mouth daily.   ??? oxyCODONE-acetaminophen (PERCOCET) 5-325 mg per tablet 30 Tab 0     Sig: Take 1 Tab by mouth every eight (8) hours as needed for Pain. Max Daily Amount: 3 Tabs.     4. Patient verbalized understanding and agreement with the plan.    5. Patient was given an after-visit summary.    6. Follow-up Disposition:  Return in about 1 month (around 08/26/2015) for f/u HTN/lipids. or sooner if worsening symptoms.          Marsh DollySemret Voncille Simm, MD

## 2015-07-28 NOTE — Progress Notes (Signed)
Please let pt know that knee x-rays showed:    1) arthritis left greater than right    2) some fluid in left knee    3) does she want to see PT?

## 2015-07-30 ENCOUNTER — Telehealth

## 2015-07-30 ENCOUNTER — Encounter

## 2015-07-30 NOTE — Telephone Encounter (Signed)
Patient is requesting a refill,m states she is having to take 3 a day due to her pain level.  Last OV 07/26/15.      Requested Prescriptions     Pending Prescriptions Disp Refills   ??? oxyCODONE-acetaminophen (PERCOCET) 5-325 mg per tablet 30 Tab 0     Sig: Take 1 Tab by mouth every eight (8) hours as needed for Pain. Max Daily Amount: 3 Tabs.

## 2015-07-30 NOTE — Addendum Note (Signed)
Addended by: Marsh DollyMEBRAHTU, Xandrea Clarey T on: 07/30/2015 04:40 PM      Modules accepted: Orders

## 2015-07-30 NOTE — Telephone Encounter (Signed)
Pt contacted at home number.  2 pt identifiers confirmed.  Pt informed of below.  Pt verbalized understanding.  Pt states she would like to see PT.  No other questions at this time.      Dr Gwenith DailyMebrahtu please generate referral

## 2015-07-30 NOTE — Telephone Encounter (Signed)
Referral generated in [facility] care for PT.

## 2015-07-30 NOTE — Telephone Encounter (Signed)
-----   Message from Rosalio LoudSemret T Mebrahtu, MD sent at 07/28/2015  9:16 AM EDT -----  Please let pt know that knee x-rays showed:    1) arthritis left greater than right    2) some fluid in left knee    3) does she want to see PT?

## 2015-08-02 MED ORDER — OXYCODONE-ACETAMINOPHEN 5 MG-325 MG TAB
5-325 mg | ORAL_TABLET | Freq: Three times a day (TID) | ORAL | 0 refills | Status: DC | PRN
Start: 2015-08-02 — End: 2015-08-31

## 2015-08-02 NOTE — Telephone Encounter (Signed)
Printed rx for:    Requested Prescriptions     Signed Prescriptions Disp Refills   ??? oxyCODONE-acetaminophen (PERCOCET) 5-325 mg per tablet 90 Tab 0     Sig: Take 1 Tab by mouth every eight (8) hours as needed for Pain. Max Daily Amount: 3 Tabs.     Authorizing Provider: Kalli Greenfield T     This is ready for pick up.

## 2015-08-18 ENCOUNTER — Encounter: Payer: MEDICAID | Primary: Internal Medicine

## 2015-08-24 ENCOUNTER — Ambulatory Visit
Admit: 2015-08-24 | Discharge: 2015-08-24 | Payer: PRIVATE HEALTH INSURANCE | Attending: Internal Medicine | Primary: Internal Medicine

## 2015-08-24 DIAGNOSIS — I10 Essential (primary) hypertension: Secondary | ICD-10-CM

## 2015-08-24 NOTE — Patient Instructions (Addendum)
1) stop smoking    2) follow-up in 1 month or sooner if worsening symptoms.       3) keep an eye on your blood pressure and let us know if too high or too low      Learning About Benefits From Quitting Smoking  How does quitting smoking make you healthier?  If you're thinking about quitting smoking, you may have a few reasons to be smoke-free. Your health may be one of them.  ?? When you quit smoking, you lower your risks for cancer, lung disease, heart attack, stroke, blood vessel disease, and blindness from macular degeneration.  ?? When you're smoke-free, you get sick less often, and you heal faster. You are less likely to get colds, flu, bronchitis, and pneumonia.  ?? As a nonsmoker, you may find that your mood is better and you are less stressed.  When and how will you feel healthier?  Quitting has real health benefits that start from day 1 of being smoke-free. And the longer you stay smoke-free, the healthier you get and the better you feel.  The first hours  ?? After just 20 minutes, your blood pressure and heart rate go down. That means there's less stress on your heart and blood vessels.  ?? Within 12 hours, the level of carbon monoxide in your blood drops back to normal. That makes room for more oxygen. With more oxygen in your body, you may notice that you have more energy than when you smoked.  After 2 weeks  ?? Your lungs start to work better.  ?? Your risk of heart attack starts to drop.  After 1 month  ?? When your lungs are clear, you cough less and breathe deeper, so it's easier to be active.  ?? Your sense of taste and smell return. That means you can enjoy food more than you have since you started smoking.  Over the years  ?? After 1 year, your risk of heart disease is half what it would be if you kept smoking.  ?? After 5 years, your risk of stroke starts to shrink. Within a few years after that, it's about the same as if you'd never smoked.   ?? After 10 years, your risk of dying from lung cancer is cut by about half. And your risk for many other types of cancer is lower too.  How would quitting help others in your life?  When you quit smoking, you improve the health of everyone who now breathes in your smoke.  ?? Their heart, lung, and cancer risks drop, much like yours.  ?? They are sick less. For babies and small children, living smoke-free means they're less likely to have ear infections, pneumonia, and bronchitis.  ?? If you're a woman who is or will be pregnant someday, quitting smoking means a healthier newborn.  ?? Children who are close to you are less likely to become adult smokers.  Where can you learn more?  Go to InsuranceStats.cahttp://www.healthwise.net/GoodHelpConnections  Enter O319 in the search box to learn more about "Learning About Benefits From Quitting Smoking."  ?? 2006-2016 [employer]. Care instructions adapted under license by [employer] (which disclaims liability or warranty for this information). This care instruction is for use with your licensed healthcare professional. If you have questions about a medical condition or this instruction, always ask your healthcare professional. [employer], Inc[employer] disclaims any warranty or liability for your use of this information.  Content Version: 11.0.578772; Current as of: Feb 25, 2015        Stopping Smoking: Care Instructions  Your Care Instructions  Cigarette smokers crave the nicotine in cigarettes. Giving it up is much harder than simply changing a habit. Your body has to stop craving the nicotine. It is hard to quit, but you can do it. There are many tools that people use to quit smoking. You may find that combining tools works best for you.  There are several steps to quitting. First you get ready to quit. Then you get support to help you. After that, you learn new skills and behaviors to become a nonsmoker. For many people, a necessary step is getting and using medicine.   Your doctor will help you set up the plan that best meets your needs. You may want to attend a smoking cessation program to help you quit smoking. When you choose a program, look for one that has proven success. Ask your doctor for ideas. You will greatly increase your chances of success if you take medicine as well as get counseling or join a cessation program.  Some of the changes you feel when you first quit tobacco are uncomfortable. Your body will miss the nicotine at first, and you may feel short-tempered and grumpy. You may have trouble sleeping or concentrating. Medicine can help you deal with these symptoms. You may struggle with changing your smoking habits and rituals. The last step is the tricky one: Be prepared for the smoking urge to continue for a time. This is a lot to deal with, but keep at it. You will feel better.  Follow-up care is a key part of your treatment and safety. Be sure to make and go to all appointments, and call your doctor if you are having problems. It???s also a good idea to know your test results and keep a list of the medicines you take.  How can you care for yourself at home?  ?? Ask your family, friends, and coworkers for support. You have a better chance of quitting if you have help and support.  ?? Join a support group, such as Nicotine Anonymous, for people who are trying to quit smoking.  ?? Consider signing up for a smoking cessation program, such as the American Lung Association's Freedom from Smoking program.  ?? Set a quit date. Pick your date carefully so that it is not right in the middle of a big deadline or stressful time. Once you quit, do not even take a puff. Get rid of all ashtrays and lighters after your last cigarette. Clean your house and your clothes so that they do not smell of smoke.  ?? Learn how to be a nonsmoker. Think about ways you can avoid those things that make you reach for a cigarette.   ?? Avoid situations that put you at greatest risk for smoking. For some people, it is hard to have a drink with friends without smoking. For others, they might skip a coffee break with coworkers who smoke.  ?? Change your daily routine. Take a different route to work or eat a meal in a different place.  ?? Cut down on stress. Calm yourself or release tension by doing an activity you enjoy, such as reading a book, taking a hot bath, or gardening.  ?? Talk to your doctor or pharmacist about nicotine replacement therapy, which replaces the nicotine in your body. You still get nicotine but you do not use tobacco. Nicotine replacement products help you slowly reduce the amount of  nicotine you need. These products come in several forms, many of them available over-the-counter:  ?? Nicotine patches  ?? Nicotine gum and lozenges  ?? Nicotine inhaler  ?? Ask your doctor about bupropion (Wellbutrin) or varenicline (Chantix), which are prescription medicines. They do not contain nicotine. They help you by reducing withdrawal symptoms, such as stress and anxiety.  ?? Some people find hypnosis, acupuncture, and massage helpful for ending the smoking habit.  ?? Eat a healthy diet and get regular exercise. Having healthy habits will help your body move past its craving for nicotine.  ?? Be prepared to keep trying. Most people are not successful the first few times they try to quit. Do not get mad at yourself if you smoke again. Make a list of things you learned and think about when you want to try again, such as next week, next month, or next year.  Where can you learn more?  Go to [employer]  Enter X6744031 in the search box to learn more about "Stopping Smoking: Care Instructions."  ?? 2006-2016 [employer]. Care instructions adapted under license by [employer] (which disclaims liability or warranty for this information). This care instruction is for use with your licensed  healthcare professional. If you have questions about a medical condition or this instruction, always ask your healthcare professional. [employer], Inc[employer] disclaims any warranty or liability for your use of this information.  Content Version: 11.0.578772; Current as of: Feb 25, 2015

## 2015-08-24 NOTE — Progress Notes (Signed)
Chief Complaint   Patient presents with   ??? Medication Evaluation     1 month f/u       HPI:     Susan [name] is a 64 y.o. African American female with history of asthma and bipolar disorder  here for the above complaint. She said her bilateral knee pain comes and goes. She will be seeing PT on 08/31/15.    She denies any chest pain, shortness of breath, abdominal pain, dizziness. She has headaches.             Past Medical History   Diagnosis Date   ??? Asthma    ??? Bipolar 1 disorder (HCC)      being followed by Dr. Donnajean Lopes   ??? Chronic obstructive pulmonary disease (HCC)    ??? Dyslipidemia    ??? Hypertension      Past Surgical History   Procedure Laterality Date   ??? Hx heent       Current Outpatient Prescriptions   Medication Sig   ??? oxyCODONE-acetaminophen (PERCOCET) 5-325 mg per tablet Take 1 Tab by mouth every eight (8) hours as needed for Pain. Max Daily Amount: 3 Tabs.   ??? pravastatin (PRAVACHOL) 20 mg tablet Take 1 Tab by mouth daily.   ??? lisinopril (PRINIVIL, ZESTRIL) 10 mg tablet Take 10 mg by mouth daily.   ??? venlafaxine-SR (EFFEXOR-XR) 75 mg capsule Take 75 mg by mouth daily.   ??? albuterol (PROVENTIL HFA, VENTOLIN HFA, PROAIR HFA) 90 mcg/actuation inhaler Take  by inhalation. Take 1-2 puffs every 4-6 hrs prn shortness of breath   ??? venlafaxine-SR (EFFEXOR-XR) 150 mg capsule Take 150 mg by mouth daily.   ??? OLANZapine (ZYPREXA) 10 mg tablet Take 10 mg by mouth nightly.   ??? albuterol (PROVENTIL VENTOLIN) 2.5 mg /3 mL (0.083 %) nebulizer solution 3 mL by Nebulization route every four (4) hours as needed for Wheezing.     No current facility-administered medications for this visit.      Health Maintenance   Topic Date Due   ??? Hepatitis C Screening  1951/02/19   ??? DTaP/Tdap/Td series (1 - Tdap) 07/19/1959   ??? PAP AKA CERVICAL CYTOLOGY  07/18/1973   ??? BREAST CANCER SCRN MAMMOGRAM  07/18/2002   ??? FOBT Q 1 YEAR AGE 74-75  07/18/2002   ??? ZOSTER VACCINE AGE 25>  07/18/2012    ??? Pneumococcal 19-64 Medium Risk  Completed   ??? INFLUENZA AGE 9 TO ADULT  Addressed     Immunization History   Administered Date(s) Administered   ??? Pneumococcal Polysaccharide (PPSV-23) 08/24/2015     No LMP recorded. Patient is postmenopausal.        Allergies and Intolerances:   Allergies   Allergen Reactions   ??? Penicillins Other (comments)     States unknown reaction        Family History:   History reviewed. No pertinent family history.    Social History:   She  reports that she has been smoking.  She does not have any smokeless tobacco history on file.  She  reports that she does not drink alcohol.            ??     OBJECTIVE:   Physical exam:   Visit Vitals   ??? BP 140/80 (BP 1 Location: Left arm, BP Patient Position: Sitting)   ??? Pulse 75   ??? Temp 96.2 ??F (35.7 ??C) (Oral)   ??? Resp 16   ??? Ht  5\' 6"  (1.676 m)   ??? Wt 173 lb 3.2 oz (78.6 kg)   ??? SpO2 99%   ??? BMI 27.96 kg/m2        Generally: Pleasant female in no acute distress  Cardiac Exam: regular, rate, and rhythm. Normal S1 and S2. No murmurs, gallops, or rubs  Pulmonary exam: Clear to auscultation bilaterally  Abdominal exam: Positive bowel sounds in all four quadrants, soft, nondistended, nontender  Extremities: 2+ dorsalis pedis pulses bilaterally. No pedal edema    bilaterally    LABS/RADIOLOGICAL TESTS:  none      ASSESSMENT/PLAN:    1. Benign hypertension without CHF: stable. She will keep an eye on her blood pressure and let us know if too high or too low. Continue the lisinopril, diet and exercise.   -     METABOLIC PANEL, COMPREHENSIVE; Future  -     URINALYSIS W/ RFLX MICROSCOPIC; Future  -     CBC W/O DIFF; Future    2. Dyslipidemia: we will see what the labs show. Continue the pravastatin, diet and exercise.   -     METABOLIC PANEL, COMPREHENSIVE; Future  -     LIPID PANEL; Future    3. Breast cancer screening  -     MAM MAMMO BI SCREENING DIGTL; Future    4. Postmenopausal  -     MAM MAMMO BI SCREENING DIGTL; Future     5. Encounter for immunization  -     PNEUMOCOCCAL POLYSACCHARIDE VACCINE, 23-VALENT, ADULT OR IMMUNOSUPPRESSED PT DOSE,    6. Colon cancer screening  -     REFERRAL TO GASTROENTEROLOGY    7. Encounter for vitamin deficiency screening  -     VITAMIN D, 25 HYDROXY; Future    8. Need for hepatitis C screening test  -     HEPATITIS C AB; Future      9. Patient verbalized understanding and agreement with the plan.    10. Patient was given an after-visit summary.    11.     Follow-up Disposition:  Return in about 1 month (around 09/23/2015) for f/u HTN. or sooner if worsening symptoms.          Marsh DollySemret Saleen Peden, MD

## 2015-08-24 NOTE — Progress Notes (Signed)
ROOM # 2    Susan [name] presents today for   Chief Complaint   Patient presents with   ??? Medication Evaluation     1 month f/u       Susan [name] preferred language for health care discussion is english/other.    Is someone accompanying this pt? no    Is the patient using any DME equipment during OV? no    Depression Screening:  PHQ 2 / 9, over the last two weeks 02/18/2015   Little interest or pleasure in doing things Not at all   Feeling down, depressed or hopeless Not at all   Total Score PHQ 2 0       Learning Assessment:  Learning Assessment 02/18/2015   PRIMARY LEARNER Patient   HIGHEST LEVEL OF EDUCATION - PRIMARY LEARNER  DID NOT GRADUATE HIGH SCHOOL   BARRIERS PRIMARY LEARNER NONE   CO-LEARNER CAREGIVER No   PRIMARY LANGUAGE ENGLISH   INTERPRETER NEED No   LEARNER PREFERENCE PRIMARY DEMONSTRATION   LEARNING SPECIAL TOPICS no   ANSWERED BY patient   RELATIONSHIP SELF   ASSESSMENT COMMENT none       Abuse Screening:  No flowsheet data found.    Fall Risk  No flowsheet data found.    Health Maintenance reviewed and discussed per provider. Yes    Susan [name] is due for multiple, see hm due.  Please order/place referral if appropriate.      Advance Directive:  1. Do you have an advance directive in place? Patient Reply: no    2. If not, would you like material regarding how to put one in place? Patient Reply: no    Coordination of Care:  1. Have you been to the ER, urgent care clinic since your last visit?  Hospitalized since your last visit? no    2. Have you seen or consulted any other health care providers outside of the [facility] since your last visit? Include any pap smears or colon screening. no

## 2015-08-30 ENCOUNTER — Inpatient Hospital Stay
Admit: 2015-08-30 | Discharge: 2015-08-30 | Disposition: A | Discharge status: Home / Self Care | Payer: MEDICAID | Attending: Emergency Medicine

## 2015-08-30 ENCOUNTER — Emergency Department: Admit: 2015-08-30 | Payer: MEDICAID | Primary: Internal Medicine

## 2015-08-30 DIAGNOSIS — J189 Pneumonia, unspecified organism: Secondary | ICD-10-CM

## 2015-08-30 LAB — URINE MICROSCOPIC ONLY
RBC: 0 /hpf (ref 0–5)
WBC: 0 /hpf (ref 0–4)

## 2015-08-30 LAB — DRUG SCREEN, URINE
AMPHETAMINES: NEGATIVE
BARBITURATES: NEGATIVE
BENZODIAZEPINES: NEGATIVE
COCAINE: NEGATIVE
METHADONE: NEGATIVE
OPIATES: NEGATIVE
PCP(PHENCYCLIDINE): NEGATIVE
THC (TH-CANNABINOL): POSITIVE — AB

## 2015-08-30 LAB — HEPATIC FUNCTION PANEL
A-G Ratio: 0.9 (ref 0.8–1.7)
ALT (SGPT): 17 U/L (ref 13–56)
AST (SGOT): 14 U/L — ABNORMAL LOW (ref 15–37)
Albumin: 3.9 g/dL (ref 3.4–5.0)
Alk. phosphatase: 107 U/L (ref 45–117)
Bilirubin, direct: 0.2 MG/DL (ref 0.0–0.2)
Bilirubin, total: 0.8 MG/DL (ref 0.2–1.0)
Globulin: 4.3 g/dL — ABNORMAL HIGH (ref 2.0–4.0)
Protein, total: 8.2 g/dL (ref 6.4–8.2)

## 2015-08-30 LAB — METABOLIC PANEL, BASIC
Anion gap: 13 mmol/L (ref 3.0–18)
BUN/Creatinine ratio: 16 (ref 12–20)
BUN: 14 MG/DL (ref 7.0–18)
CO2: 25 mmol/L (ref 21–32)
Calcium: 9.6 MG/DL (ref 8.5–10.1)
Chloride: 99 mmol/L — ABNORMAL LOW (ref 100–108)
Creatinine: 0.89 MG/DL (ref 0.6–1.3)
GFR est AA: >60 mL/min/{1.73_m2} (ref >60)
GFR est non-AA: >60 mL/min/{1.73_m2} (ref >60)
Glucose: 139 mg/dL — ABNORMAL HIGH (ref 74–99)
Potassium: 4 mmol/L (ref 3.5–5.5)
Sodium: 137 mmol/L (ref 136–145)

## 2015-08-30 LAB — CBC WITH AUTOMATED DIFF
ABS. BASOPHILS: 0 10*3/uL (ref 0.0–0.06)
ABS. EOSINOPHILS: 0 10*3/uL (ref 0.0–0.4)
ABS. LYMPHOCYTES: 1 10*3/uL (ref 0.9–3.6)
ABS. MONOCYTES: 0.7 10*3/uL (ref 0.05–1.2)
ABS. NEUTROPHILS: 12.6 10*3/uL — ABNORMAL HIGH (ref 1.8–8.0)
BASOPHILS: 0 % (ref 0–2)
EOSINOPHILS: 0 % (ref 0–5)
HCT: 45.4 % — ABNORMAL HIGH (ref 35.0–45.0)
HGB: 15.3 g/dL (ref 12.0–16.0)
LYMPHOCYTES: 7 % — ABNORMAL LOW (ref 21–52)
MCH: 31.8 PG (ref 24.0–34.0)
MCHC: 33.7 g/dL (ref 31.0–37.0)
MCV: 94.4 FL (ref 74.0–97.0)
MONOCYTES: 5 % (ref 3–10)
MPV: 9.8 FL (ref 9.2–11.8)
NEUTROPHILS: 88 % — ABNORMAL HIGH (ref 40–73)
PLATELET: 210 10*3/uL (ref 135–420)
RBC: 4.81 M/uL (ref 4.20–5.30)
RDW: 14 % (ref 11.6–14.5)
WBC: 14.3 10*3/uL — ABNORMAL HIGH (ref 4.6–13.2)

## 2015-08-30 LAB — URINALYSIS W/ RFLX MICROSCOPIC
Bilirubin: NEGATIVE
Glucose: NEGATIVE mg/dL
Ketone: 15 mg/dL — AB
Leukocyte Esterase: NEGATIVE
Nitrites: NEGATIVE
Protein: NEGATIVE mg/dL
Specific gravity: >1.03 — ABNORMAL HIGH (ref 1.005–1.030)
Urobilinogen: 0.2 EU/dL (ref 0.2–1.0)
pH (UA): 6.5 (ref 5.0–8.0)

## 2015-08-30 LAB — CARDIAC PANEL,(CK, CKMB & TROPONIN)
CK - MB: 2.2 ng/ml (ref 0.5–3.6)
CK-MB Index: 1.2 % (ref 0.0–4.0)
CK: 186 U/L (ref 26–192)
Troponin-I, QT: <0.02 NG/ML (ref 0.0–0.045)

## 2015-08-30 LAB — LIPASE: Lipase: 57 U/L — ABNORMAL LOW (ref 73–393)

## 2015-08-30 MED ORDER — LEVOFLOXACIN 750 MG TAB
750 mg | ORAL | Status: AC
Start: 2015-08-30 — End: 2015-08-30

## 2015-08-30 MED ORDER — MORPHINE 4 MG/ML SYRINGE
4 mg/mL | INTRAMUSCULAR | Status: AC
Start: 2015-08-30 — End: 2015-08-30
  Administered 2015-08-30: 07:00:00 via INTRAVENOUS

## 2015-08-30 MED ORDER — LEVOFLOXACIN 750 MG TAB
750 mg | ORAL_TABLET | Freq: Every day | ORAL | 0 refills | Status: DC
Start: 2015-08-30 — End: 2015-10-07

## 2015-08-30 MED ORDER — IOPAMIDOL 61 % IV SOLN
300 mg iodine /mL (61 %) | Freq: Once | INTRAVENOUS | Status: AC
Start: 2015-08-30 — End: 2015-08-30
  Administered 2015-08-30: 07:00:00 via INTRAVENOUS

## 2015-08-30 MED ORDER — LEVOFLOXACIN 750 MG TAB
750 mg | ORAL | Status: AC
Start: 2015-08-30 — End: 2015-08-30
  Administered 2015-08-30: 08:00:00 via ORAL

## 2015-08-30 MED ORDER — SODIUM CHLORIDE 0.9% BOLUS IV
0.9 % | Freq: Once | INTRAVENOUS | Status: AC
Start: 2015-08-30 — End: 2015-08-30
  Administered 2015-08-30: 06:00:00 via INTRAVENOUS

## 2015-08-30 MED ORDER — ONDANSETRON (PF) 4 MG/2 ML INJECTION
4 mg/2 mL | INTRAMUSCULAR | Status: AC
Start: 2015-08-30 — End: 2015-08-30
  Administered 2015-08-30: 06:00:00 via INTRAVENOUS

## 2015-08-30 MED FILL — ONDANSETRON (PF) 4 MG/2 ML INJECTION: 4 mg/2 mL | INTRAMUSCULAR | Qty: 2

## 2015-08-30 MED FILL — MORPHINE 4 MG/ML SYRINGE: 4 mg/mL | INTRAMUSCULAR | Qty: 1

## 2015-08-30 MED FILL — SODIUM CHLORIDE 0.9 % IV: INTRAVENOUS | Qty: 1000

## 2015-08-30 MED FILL — LEVOFLOXACIN 750 MG TAB: 750 mg | ORAL | Qty: 1

## 2015-08-30 MED FILL — ISOVUE-300  61 % INTRAVENOUS SOLUTION: 300 mg iodine /mL (61 %) | INTRAVENOUS | Qty: 100

## 2015-08-30 NOTE — ED Notes (Signed)
I have reviewed discharge instructions with the patient.  The patient verbalized understanding.    Patient armband removed and shredded

## 2015-08-30 NOTE — ED Notes (Signed)
Patient states she is unable to provide a urine sample.

## 2015-08-30 NOTE — ED Triage Notes (Signed)
Patient brought in by EMS complaining of abdominal pain since 1430 of 08/29/15. Patient states she has had nausea, but no vomiting. Denies any chest pain/discomfort, dizziness, lightheaded, but states she has SOB that is chronic.

## 2015-08-30 NOTE — ED Notes (Signed)
Purposeful rounding completed:    Side rails up x 2:  YES  Bed in low position and wheels locked: YES  Call bell within reach: YES  Comfort addressed: YES    Toileting needs addressed: YES  Plan of care reviewed/updated with patient and or family members: YES  IV site assessed: YES  Pain assessed and addressed: YES, 10

## 2015-08-30 NOTE — ED Notes (Signed)
Purposeful rounding completed:    Side rails up x 2:  YES  Bed in low position and wheels locked: YES  Call bell within reach: YES  Comfort addressed: YES    Toileting needs addressed: YES  Plan of care reviewed/updated with patient and or family members: YES  IV site assessed: YES  Pain assessed and addressed: YES, 0 patient sleeping comfortable

## 2015-08-30 NOTE — ED Triage Notes (Signed)
ABD pain with nausea since 1430

## 2015-08-30 NOTE — ED Notes (Addendum)
Patient resting comfortable. Husband at bedside

## 2015-08-30 NOTE — ED Provider Notes (Addendum)
Patient is a 64 y.o. female presenting with abdominal pain. The history is provided by the patient.   Abdominal Pain    This is a new problem. Associated symptoms include nausea, vomiting and chest pain. Pertinent negatives include no fever, no diarrhea, no constipation and no back pain.      Pt is c/o abd pain that began yesterday.  Vomited once green emesis.  Denies diarrhea, fever, dysuria, HA, hematemesis.  Denies prior EGD or colonoscopy; denies h/o PUD, GERD, pancreatitis, prior abd surgeries.    Says pain radiates to her chest.    Denies illicits.  +drinks ETOH.  Smokes.  Past Medical History:   Diagnosis Date   ??? Asthma    ??? Bipolar 1 disorder ([place])      being followed by Dr. Arnell Sieving   ??? Chronic obstructive pulmonary disease ([place])    ??? Dyslipidemia    ??? Hypertension        Past Surgical History:   Procedure Laterality Date   ??? Hx heent           History reviewed. No pertinent family history.    Social History     Social History   ??? Marital status: MARRIED     Spouse name: N/A   ??? Number of children: N/A   ??? Years of education: N/A     Occupational History   ??? Not on file.     Social History Main Topics   ??? Smoking status: Current Every Day Smoker   ??? Smokeless tobacco: Not on file   ??? Alcohol use No   ??? Drug use: No   ??? Sexual activity: No     Other Topics Concern   ??? Not on file     Social History Narrative         ALLERGIES: Penicillins    Review of Systems   Constitutional: Positive for appetite change. Negative for fever and unexpected weight change.   Respiratory: Positive for shortness of breath. Negative for wheezing.    Cardiovascular: Positive for chest pain.   Gastrointestinal: Positive for abdominal pain, nausea and vomiting. Negative for blood in stool, constipation and diarrhea.   Genitourinary: Negative for flank pain.   Musculoskeletal: Negative for back pain.   Neurological: Negative for weakness.   All other systems reviewed and are negative.      Vitals:     08/30/15 0034 08/30/15 0035 08/30/15 0300   BP: (!) 166/95  (!) 170/95   Pulse: 75  85   Resp: 24  16   Temp: 97.5 ??F (36.4 ??C)     SpO2: 98% 96% 97%   Weight: 78.5 kg (173 lb)     Height: 5' 7"  (1.702 m)              Physical Exam   Constitutional: She is oriented to person, place, and time. She appears well-developed.   HENT:   Head: Normocephalic and atraumatic.   Eyes: Pupils are equal, round, and reactive to light.   Neck: No JVD present. No tracheal deviation present. No thyromegaly present.   Cardiovascular: Normal rate, regular rhythm and normal heart sounds.  Exam reveals no gallop and no friction rub.    No murmur heard.  Pulmonary/Chest: Effort normal and breath sounds normal. No stridor. No respiratory distress. She has no wheezes. She has no rales. She exhibits no tenderness.   Abdominal: Soft. She exhibits no distension and no mass. There is tenderness. There is no rebound  and no guarding.   Generalized tenderness.   Musculoskeletal: She exhibits no edema or tenderness.   Lymphadenopathy:     She has no cervical adenopathy.   Neurological: She is alert and oriented to person, place, and time.   Skin: Skin is warm and dry. No rash noted. No erythema. No pallor.   Psychiatric: She has a normal mood and affect. Her behavior is normal. Thought content normal.   Nursing note and vitals reviewed.       MDM  Number of Diagnoses or Management Options  Chronic obstructive pulmonary disease, unspecified COPD type ([place]):   Community acquired pneumonia:   Secondary hypertension:   Diagnosis management comments: Differential: angina; PUD; GERD; pancreatitis; PNA; PE; cardiomyopathy; AAA; colitis; hepatitis    Nothing acute on abdominal or pelvis on CT scan.  RML infiltrate vs scar tissue at visualized lung bases.  Slight elevation in WBC.  Treat and f/up with PCP.  H/o COPD.       Amount and/or Complexity of Data Reviewed  Clinical lab tests: reviewed and ordered   Tests in the radiology section of CPT??: ordered and reviewed  Discuss the patient with other providers: yes    Risk of Complications, Morbidity, and/or Mortality  Presenting problems: moderate  Diagnostic procedures: moderate  Management options: moderate      ED Course       Procedures           Recent Results (from the past 12 hour(s))   CBC WITH AUTOMATED DIFF    Collection Time: 08/30/15 12:42 AM   Result Value Ref Range    WBC 14.3 (H) 4.6 - 13.2 K/uL    RBC 4.81 4.20 - 5.30 M/uL    HGB 15.3 12.0 - 16.0 g/dL    HCT 45.4 (H) 35.0 - 45.0 %    MCV 94.4 74.0 - 97.0 FL    MCH 31.8 24.0 - 34.0 PG    MCHC 33.7 31.0 - 37.0 g/dL    RDW 14.0 11.6 - 14.5 %    PLATELET 210 135 - 420 K/uL    MPV 9.8 9.2 - 11.8 FL    NEUTROPHILS 88 (H) 40 - 73 %    LYMPHOCYTES 7 (L) 21 - 52 %    MONOCYTES 5 3 - 10 %    EOSINOPHILS 0 0 - 5 %    BASOPHILS 0 0 - 2 %    ABS. NEUTROPHILS 12.6 (H) 1.8 - 8.0 K/UL    ABS. LYMPHOCYTES 1.0 0.9 - 3.6 K/UL    ABS. MONOCYTES 0.7 0.05 - 1.2 K/UL    ABS. EOSINOPHILS 0.0 0.0 - 0.4 K/UL    ABS. BASOPHILS 0.0 0.0 - 0.06 K/UL    DF AUTOMATED     METABOLIC PANEL, BASIC    Collection Time: 08/30/15 12:42 AM   Result Value Ref Range    Sodium 137 136 - 145 mmol/L    Potassium 4.0 3.5 - 5.5 mmol/L    Chloride 99 (L) 100 - 108 mmol/L    CO2 25 21 - 32 mmol/L    Anion gap 13 3.0 - 18 mmol/L    Glucose 139 (H) 74 - 99 mg/dL    BUN 14 7.0 - 18 MG/DL    Creatinine 0.89 0.6 - 1.3 MG/DL    BUN/Creatinine ratio 16 12 - 20      GFR est AA >60 >60 ml/min/1.25m    GFR est non-AA >60 >60 ml/min/1.753m   Calcium 9.6  8.5 - 10.1 MG/DL   HEPATIC FUNCTION PANEL    Collection Time: 08/30/15 12:42 AM   Result Value Ref Range    Protein, total 8.2 6.4 - 8.2 g/dL    Albumin 3.9 3.4 - 5.0 g/dL    Globulin 4.3 (H) 2.0 - 4.0 g/dL    A-G Ratio 0.9 0.8 - 1.7      Bilirubin, total 0.8 0.2 - 1.0 MG/DL    Bilirubin, direct 0.2 0.0 - 0.2 MG/DL    Alk. phosphatase 107 45 - 117 U/L    AST 14 (L) 15 - 37 U/L    ALT 17 13 - 56 U/L   LIPASE     Collection Time: 08/30/15 12:42 AM   Result Value Ref Range    Lipase 57 (L) 73 - 393 U/L   CARDIAC PANEL,(CK, CKMB & TROPONIN)    Collection Time: 08/30/15 12:42 AM   Result Value Ref Range    CK 186 26 - 192 U/L    CK - MB 2.2 0.5 - 3.6 ng/ml    CK-MB Index 1.2 0.0 - 4.0 %    Troponin-I, Qt. <0.02 0.0 - 0.045 NG/ML   URINALYSIS W/ RFLX MICROSCOPIC    Collection Time: 08/30/15  1:52 AM   Result Value Ref Range    Color YELLOW      Appearance CLEAR      Specific gravity >1.030 (H) 1.005 - 1.030    pH (UA) 6.5 5.0 - 8.0      Protein NEGATIVE  NEG mg/dL    Glucose NEGATIVE  NEG mg/dL    Ketone 15 (A) NEG mg/dL    Bilirubin NEGATIVE  NEG      Blood TRACE (A) NEG      Urobilinogen 0.2 0.2 - 1.0 EU/dL    Nitrites NEGATIVE  NEG      Leukocyte Esterase NEGATIVE  NEG     DRUG SCREEN, URINE    Collection Time: 08/30/15  1:52 AM   Result Value Ref Range    BENZODIAZEPINE NEGATIVE  NEG      BARBITURATES NEGATIVE  NEG      THC (TH-CANNABINOL) POSITIVE (A) NEG      OPIATES NEGATIVE  NEG      PCP(PHENCYCLIDINE) NEGATIVE  NEG      COCAINE NEGATIVE  NEG      AMPHETAMINE NEGATIVE  NEG      METHADONE NEGATIVE       HDSCOM (NOTE)      2:54 AM  Diagnosis:   1. Community acquired pneumonia    2. Chronic obstructive pulmonary disease, unspecified COPD type ([place])    3. Secondary hypertension          Disposition: home    Follow-up Information     Follow up With Details Comments Contact Info    Lady Gary, MD Schedule an appointment as soon as possible for a visit in 1 day  [address]  [address]  [place] [address]  [phone]      [facility] EMERGENCY DEPT  If symptoms worsen return immediately [place]  [phone]          Patient's Medications   Start Taking    LEVOFLOXACIN (LEVAQUIN) 750 MG TABLET    Take 1 Tab by mouth daily.   Continue Taking    ALBUTEROL (PROVENTIL HFA, VENTOLIN HFA, PROAIR HFA) 90 MCG/ACTUATION INHALER    Take  by inhalation. Take 1-2 puffs every 4-6  hrs prn shortness  of breath    ALBUTEROL (PROVENTIL VENTOLIN) 2.5 MG /3 ML (0.083 %) NEBULIZER SOLUTION    3 mL by Nebulization route every four (4) hours as needed for Wheezing.    LISINOPRIL (PRINIVIL, ZESTRIL) 10 MG TABLET    Take 10 mg by mouth daily.    OLANZAPINE (ZYPREXA) 10 MG TABLET    Take 10 mg by mouth nightly.    OXYCODONE-ACETAMINOPHEN (PERCOCET) 5-325 MG PER TABLET    Take 1 Tab by mouth every eight (8) hours as needed for Pain. Max Daily Amount: 3 Tabs.    PRAVASTATIN (PRAVACHOL) 20 MG TABLET    Take 1 Tab by mouth daily.    VENLAFAXINE-SR (EFFEXOR-XR) 150 MG CAPSULE    Take 150 mg by mouth daily.   These Medications have changed    No medications on file   Stop Taking    VENLAFAXINE-SR (EFFEXOR-XR) 75 MG CAPSULE    Take 75 mg by mouth daily.

## 2015-08-31 ENCOUNTER — Encounter

## 2015-08-31 ENCOUNTER — Inpatient Hospital Stay: Admit: 2015-08-31 | Payer: MEDICAID | Primary: Internal Medicine

## 2015-08-31 DIAGNOSIS — M25561 Pain in right knee: Secondary | ICD-10-CM

## 2015-08-31 MED ORDER — OXYCODONE-ACETAMINOPHEN 5 MG-325 MG TAB
5-325 mg | ORAL_TABLET | Freq: Three times a day (TID) | ORAL | 0 refills | Status: DC | PRN
Start: 2015-08-31 — End: 2015-10-07

## 2015-08-31 NOTE — Progress Notes (Signed)
PT DAILY TREATMENT NOTE 8-14    Patient Name: Susan [name]  Date:08/31/2015  DOB: 1951/09/19    Patient DOB Verified  Payor: BLUE CROSS MEDICAID / [place] [employer] [place] [employer] [place] / Product Type: Managed Care Medicaid /    In time:12:10  Out time: 12:45  Total Treatment Time (min): 35  Total Timed Codes (min): NA  1:1 Treatment Time (min): NA   Visit #: 1 of 8-12    Treatment Area: Bilateral chronic knee pain [M25.561, M25.562, G89.29]    SUBJECTIVE  Pain Level (0-10 scale): 7  Any medication changes, allergies to medications, adverse drug reactions, diagnosis change, or new procedure performed?:  No     Yes (see summary sheet for update)  Subjective functional status/changes:    No changes reported  As per eval.    OBJECTIVE  Modality rationale: decrease edema, decrease inflammation and decrease pain to improve the patient???s ability to return to adl's.   Min Type Additional Details     Estim: Att   Unatt        TENS instruct                  IFC  Premod   NMES                     Other:  w/US   w/ice   w/heat  Position:  Location:      Traction:  Cervical       Lumbar                        Prone          Supine                       Intermittent   Continuous Lbs:   before manual   after manual      Ultrasound: Continuous    Pulsed                             Location:  W/cm2:      Iontophoresis with dexamethasone         Location:  Take home patch    In clinic   10   Ice       heat    Ice massage Position:seated with support  Location: B knees      Vasopneumatic Device Pressure:        lo  med  hi   Temperature:  lo  med  hi    Skin assessment post-treatment:  intact redness- no adverse reaction       redness ??? adverse reaction:       10 min Therapeutic Exercise:   See flow sheet :   Rationale: increase ROM, increase strength, improve coordination, improve balance and increase proprioception to improve the patient???s ability to return to ADL's.            min Patient Education:  Review HEP     Progressed/Changed HEP based on:    positioning    body mechanics    transfers    heat/ice application        Other Objective/Functional Measures:   As per eval.  Pain Level (0-10 scale) post treatment: 6    ASSESSMENT/Changes in Function: as per eval.    Patient will continue to benefit from skilled PT services  to modify and progress therapeutic interventions, address functional mobility deficits, address ROM deficits, address strength deficits, analyze and address soft tissue restrictions, analyze and cue movement patterns, analyze and modify body mechanics/ergonomics, assess and modify postural abnormalities, address imbalance/dizziness and instruct in home and community integration to attain remaining goals.       See Plan of Care    See progress note/recertification    See Discharge Summary         Progress towards goals / Updated goals:  As per eval.    PLAN    Upgrade activities as tolerated       Continue plan of care    Update interventions per flow sheet         Discharge due to:_    Other:_      Lyla Sonarrie A Annetta Deiss, DPT 08/31/2015  12:21 PM

## 2015-08-31 NOTE — Telephone Encounter (Signed)
Printed rx for:    Requested Prescriptions     Signed Prescriptions Disp Refills   ??? oxyCODONE-acetaminophen (PERCOCET) 5-325 mg per tablet 90 Tab 0     Sig: Take 1 Tab by mouth every eight (8) hours as needed for Pain. Max Daily Amount: 3 Tabs.     Authorizing Provider: Marsh DollyMEBRAHTU, Jodeci Rini T     This is ready for pick up. Please notify pt.

## 2015-08-31 NOTE — Progress Notes (Signed)
[facility]   [address] [place], [place] [address]  Phone: [phone] Fax: [phone]  PLAN OF CARE / STATEMENT OF MEDICAL NECESSITY FOR PHYSICAL THERAPY SERVICES  Patient Name: Susan [name] DOB: 06/29/1951   Medical   Diagnosis: Bilateral chronic knee pain [M25.561, M25.562, G89.29] Treatment Diagnosis: B knee pain   Onset Date: > 1 year     Referral Source: Rosalio Loud, MD Start of Care [facility]): 08/31/2015   Prior Hospitalization: See medical history Provider #: [phone]   Prior Level of Function: I with ADL's,   Comorbidities: COPD, depression, HBP, arthritis.   Medications: Verified on Patient Summary List   The Plan of Care and following information is based on the information from the initial evaluation.   ========================================================================  Assessment / key information:  Patient is a 64 year old female who reported she has B knee pain that began when she was born. Patient stated she has increased pain in the last year. 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   Patient stated she has history of blood clots and at times is scared she has blood clots.  She is not wearing 02 today.   Patient was told to wear O2 next visit since she will be performing more exercises.  Patient's O2 level today 88% and was monitored for SOB.  Patient reported she fell last month due to loss of balance. Patient also indicated that she was born with some sort of abnormality and is unsure why.  Patient is fair historian and did report some memory loss.  Patient indicated generalized B knee pain.  Increased pain with walking and standing.  She does not use AD and lives in one story home with husband.  Patient did not appear to have edema in LE's.  Full B knee mobility with pain at end-range. Patient appeared to have decreased B knee strength 4/5 with MMT (flexion/extension). Fair balance with SLS able to  hold 10 seconds today with hold with finger.  Patient will benefit from PT for strengthening/ROM/balance activities to address such deficits for B knees.   ========================================================================  Problem List: pain affecting function, decrease ROM, decrease strength, edema affecting function, impaired gait/ balance, decrease ADL/ functional abilitiies, decrease activity tolerance, decrease flexibility/ joint mobility and decrease transfer abilities   Treatment Plan may include any combination of the following: Therapeutic exercise, Therapeutic activities, Neuromuscular re-education, Physical agent/modality, Gait/balance training, Manual therapy, Aquatic therapy, Patient education, Self Care training, Functional mobility training, Home safety training and Stair training  Patient / Family readiness to learn indicated by: asking questions, trying to perform skills and interest  Persons(s) to be included in education: patient (P)  Barriers to Learning/Limitations: None  Measures taken: Full B knee ROM flexion and extension  4/5 LE weakness B knee flexion and extension  Decreased SLS balance  FS score 43 placing patient in stage 4 (limited community ambulator).     Patient Goal (s): "Reduce pain to walk further distances."   Patient self reported health status: good  Rehabilitation Potential: good  ?? Short Term Goals: To be accomplished in 2-3 weeks:  1. Pt will be compliant with HEP for symptom management at home.  2. Pt will report decreased B knee pan <=5/10 at worst being able to ambulate further distances.   3. Pt will demo increased Bquad strength to 4+/5 with MMT to return to ADL's.    ?? Long  Term Goals: To be accomplished in 4-6 weeks:  1. Pt will be independent with HEP at D/C for self management.  2. Pt will increase FOTO score to >/= 54 to indicate a significant increase in activity tolerance.  3. Pt will report being able to ambulate >=20 minutes in the community  without AD to return to community activities.     Frequency / Duration:   Patient to be seen  2-3  times per week for 4-6  weeks:  Patient / Caregiver education and instruction: self care, activity modification and exercises  G-Codes (GP): NA  Therapist Signature: Delorse Limberarrie A Jakira Mcfadden, DPT Date: 08/31/2015   Certification Period: NA Time: 12:16 PM   ========================================================================  I certify that the above Physical Therapy Services are being furnished while the patient is under my care.  I agree with the treatment plan and certify that this therapy is necessary.  Physician Signature:        Date:       Time:   Please sign and [facility] to [facility] or you may fax the signed copy to [phone].  Thank you.

## 2015-08-31 NOTE — Telephone Encounter (Signed)
Requested Prescriptions     Pending Prescriptions Disp Refills   ??? oxyCODONE-acetaminophen (PERCOCET) 5-325 mg per tablet 90 Tab 0     Sig: Take 1 Tab by mouth every eight (8) hours as needed for Pain. Max Daily Amount: 3 Tabs.

## 2015-09-06 ENCOUNTER — Inpatient Hospital Stay: Admit: 2015-09-06 | Payer: MEDICAID | Primary: Internal Medicine

## 2015-09-06 DIAGNOSIS — M25561 Pain in right knee: Secondary | ICD-10-CM

## 2015-09-06 NOTE — Progress Notes (Signed)
PT DAILY TREATMENT NOTE 8-14    Patient Name: Susan [name]  Date:09/06/2015  DOB: 11-22-1950    Patient DOB Verified  Payor: BLUE CROSS MEDICAID / Plan: [place] / Product Type: Managed Care Medicaid /    In time: 4:00pm     Out time:   4:41pm  Total Treatment Time (min): 41  Visit #: 2 of 8-12    Treatment Area: Bilateral chronic knee pain [M25.561, M25.562, G89.29]    SUBJECTIVE  Pain Level (0-10 scale): 5  Any medication changes, allergies to medications, adverse drug reactions, diagnosis change, or new procedure performed?:  No     Yes (see summary sheet for update)  Subjective functional status/changes:    No changes reported  "I brought my oxygen today." Patient arriving with portable O2.    OBJECTIVE  Modality rationale: PD   Min Type Additional Details     Estim: Att   Unatt        TENS instruct                  IFC  Premod   NMES                     Other:  w/US   w/ice   w/heat  Position:  Location:      Traction:  Cervical       Lumbar                        Prone          Supine                       Intermittent   Continuous Lbs:   before manual   after manual      Ultrasound: Continuous    Pulsed                           1MHz   3MHz Location:  W/cm2:      Iontophoresis with dexamethasone         Location:  Take home patch    In clinic   PD   Ice       heat    Ice massage Position:  Location:      Vasopneumatic Device Pressure:        lo  med  hi   Temperature:  lo  med  hi    Skin assessment post-treatment:  intact redness- no adverse reaction       redness ??? adverse reaction:     41 min Therapeutic Exercise:   See flow sheet: initiated therex per IE   Rationale: increase ROM, increase strength, improve balance and increase proprioception to improve the patient???s ability to ambulate with normalized gait pattern for prolonged distances          X min Patient Education:  Review HEP from IE;l encouraged patient to arrive with full tank of O2       Other Objective/Functional Measures:    Patient arriving with empty portable O2; instructed patient in proper use of portable O2 and encouraged patient to arrive with full tank NV. Assessed O2 saturation each minute on NuStep and following each therex with maintenance above 96-97% all treatment (must use 3rd digit on R hand for pulse ox sec fingernail extensions).   SLS: (R) 7secs, (L) 8secs   SR  EO max: (R) 24secs, (L) 30secs     Pain Level (0-10 scale) post treatment: 5 in (R) lateral thigh, 0 in (B) knees    ASSESSMENT/Changes in Function:   Good tolerance to first f/u treatment with patient req 100% verbal cueing and demo for proper form/technique with all therex. O2 saturation maintained at 96-97% throughout treatment with patient instructed for deep diaphragmatic breathing periodically to reverse inc in RR. Will attempt bed-level therex NV.    Patient will continue to benefit from skilled PT services to modify and progress therapeutic interventions, address functional mobility deficits, address ROM deficits, address strength deficits, analyze and address soft tissue restrictions, analyze and cue movement patterns, analyze and modify body mechanics/ergonomics, assess and modify postural abnormalities, address imbalance/dizziness and instruct in home and community integration to attain remaining goals.       See Plan of Care    See progress note/recertification    See Discharge Summary         Progress towards goals / Updated goals:  Short Term Goals: To be accomplished in 2-3 weeks:  1. Pt will be compliant with HEP for symptom management at home. -Goal met; patient reporting compliance (09/06/15)  2. Pt will report decreased B knee pain <=5/10 at worst being able to ambulate further distances.   3. Pt will demo increased B quad strength to 4+/5 with MMT to return to ADL's.  Long Term Goals: To be accomplished in 4-6 weeks:  1. Pt will be independent with HEP at D/C for self management.   2. Pt will increase FOTO score to >/= 54 to indicate a significant increase in activity tolerance.  3. Pt will report being able to ambulate >=20 minutes in the community without AD to return to community activities. ??    PLAN    Upgrade activities as tolerated       Continue plan of care    Update interventions per flow sheet         Discharge due to:_    Other:    Creta Levin, PTA 09/06/2015

## 2015-09-08 ENCOUNTER — Inpatient Hospital Stay: Payer: MEDICAID | Primary: Internal Medicine

## 2015-09-10 ENCOUNTER — Inpatient Hospital Stay: Admit: 2015-09-10 | Payer: MEDICAID | Primary: Internal Medicine

## 2015-09-10 NOTE — Progress Notes (Signed)
PT DAILY TREATMENT NOTE 8-14    Patient Name: Susan [name]  Date:09/10/2015  DOB: 31-May-1951    Patient DOB Verified  Payor: BLUE CROSS MEDICAID / Plan: [place] / Product Type: Managed Care Medicaid /    In time: 9:53am     Out time:   10:48am  Total Treatment Time (min): 55  Visit #: 3 of 8-12    Treatment Area: Bilateral chronic knee pain [M25.561, M25.562, G89.29]    SUBJECTIVE  Pain Level (0-10 scale): 0; L shoulder pain  Any medication changes, allergies to medications, adverse drug reactions, diagnosis change, or new procedure performed?:  No     Yes (see summary sheet for update)  Subjective functional status/changes:    No changes reported  "I'm okay. My left shoulder is acting up, but it's always bad when its cold."    OBJECTIVE  Modality rationale: PD   Min Type Additional Details     Estim: Att   Unatt        TENS instruct                  IFC  Premod   NMES                     Other:  w/US   w/ice   w/heat  Position:  Location:      Traction:  Cervical       Lumbar                        Prone          Supine                       Intermittent   Continuous Lbs:   before manual   after manual      Ultrasound: Continuous    Pulsed                           1MHz   3MHz Location:  W/cm2:      Iontophoresis with dexamethasone         Location:  Take home patch    In clinic   PD   Ice       heat    Ice massage Position:  Location:      Vasopneumatic Device Pressure:        lo  med  hi   Temperature:  lo  med  hi    Skin assessment post-treatment:  intact redness- no adverse reaction       redness ??? adverse reaction:     55 min Therapeutic Exercise:   See flow sheet: added foam to standing marches, added bridges, progressed std hip 3-way to RTB, added clams   Rationale: increase ROM, increase strength, improve balance and increase proprioception to improve the patient???s ability to ambulate with normalized gait pattern for prolonged distances           X min Patient Education:  Review HEP with progression to include bridges and clams     Other Objective/Functional Measures:    Knee strength: B 4/5    O2 stats, throughout treatment: 98-99% saturation   SLS: (B) 10 seconds    Pain Level (0-10 scale) post treatment: 0    ASSESSMENT/Changes in Function:   Patient making steady progress towards LTGs. Significant hip abd noted, but patient  demo proper isolation with clams to address with minimal trunk rotation. VCs for TA draw required with most therex. SLS maintained at 10 seconds. Progressed HEP for glut and core strengthening.    Patient will continue to benefit from skilled PT services to modify and progress therapeutic interventions, address functional mobility deficits, address ROM deficits, address strength deficits, analyze and address soft tissue restrictions, analyze and cue movement patterns, analyze and modify body mechanics/ergonomics, assess and modify postural abnormalities, address imbalance/dizziness and instruct in home and community integration to attain remaining goals.       See Plan of Care    See progress note/recertification    See Discharge Summary         Progress towards goals / Updated goals:  Short Term Goals: To be accomplished in 2-3 weeks:  1. Pt will be compliant with HEP for symptom management at home. -Goal met; patient reporting compliance (09/06/15)  2. Pt will report decreased B knee pain <=5/10 at worst being able to ambulate further distances.   3. Pt will demo increased B quad strength to 4+/5 with MMT to return to ADL's. -Goal progressing at 4/5 knee ext strength (09/10/15)  Long Term Goals: To be accomplished in 4-6 weeks:  1. Pt will be independent with HEP at D/C for self management.  2. Pt will increase FOTO score to >/= 54 to indicate a significant increase in activity tolerance.  3. Pt will report being able to ambulate >=20 minutes in the community without AD to return to community activities. ??    PLAN     Upgrade activities as tolerated       Continue plan of care    Update interventions per flow sheet         Discharge due to:_    Other:    Creta Levin, PTA 09/10/2015

## 2015-09-13 ENCOUNTER — Inpatient Hospital Stay: Payer: MEDICAID | Primary: Internal Medicine

## 2015-09-15 ENCOUNTER — Inpatient Hospital Stay: Payer: MEDICAID | Primary: Internal Medicine

## 2015-09-17 ENCOUNTER — Inpatient Hospital Stay: Admit: 2015-09-17 | Payer: MEDICAID | Primary: Internal Medicine

## 2015-09-17 NOTE — Progress Notes (Signed)
PT DAILY TREATMENT NOTE 8-14    Patient Name: Susan [name]  Date:09/17/2015  DOB: 07-11-1951    Patient DOB Verified  Payor: BLUE CROSS MEDICAID / Plan: [place] / Product Type: Managed Care Medicaid /    In time:9:56am  Out time:11:01am  Total Treatment Time (min): 65  Total Timed Codes (min): 55  1:1 Treatment Time (min): 55   Visit #: 4 of 8-12    Treatment Area: Bilateral chronic knee pain [M25.561, M25.562, G89.29]    SUBJECTIVE  Pain Level (0-10 scale): 7  Any medication changes, allergies to medications, adverse drug reactions, diagnosis change, or new procedure performed?:  No     Yes (see summary sheet for update)  Subjective functional status/changes:    No changes reported  "I haven't been here because of transportation issues. I am trying to clear things up so I can be here more because I need this. My knees are hurting me a lot more than usual because of the cold weather. I am doing my HEP with my husband's help."    OBJECTIVE  Modality rationale: decrease pain and increase tissue extensibility to improve the patient???s ability to tolerate WB activities and improve knee mobility with > ease and less pain.   Min Type Additional Details     Estim: Att   Unatt        TENS instruct                  IFC  Premod   NMES                     Other:  w/US   w/ice   w/heat  Position:  Location:      Traction:  Cervical       Lumbar                        Prone          Supine                       Intermittent   Continuous Lbs:   before manual   after manual      Ultrasound: Continuous    Pulsed                           1MHz   3MHz Location:  W/cm2:      Iontophoresis with dexamethasone         Location:  Take home patch    In clinic   10   Ice       heat    Ice massage Position: semireclined with BLEs elevated on bloster  Location: B knees      Vasopneumatic Device Pressure:        lo  med  hi   Temperature:  lo  med  hi     Skin assessment post-treatment:  intact redness- no adverse reaction       redness ??? adverse reaction:     45 min Therapeutic Exercise:   See flow sheet : rendered diaphragmatic breathing (DBT) 5x1' throughout today's Rx during TE to improve pt's SOB and increase SpO2.   Rationale: increase ROM, increase strength, improve balance and increase proprioception to improve the patient???s ability to ambulate with normalized gait pattern for prolonged distances    X min Patient Education:  Review HEP  Other Objective/Functional Measures:   Knee strength: B 4/5 with increased knee pain with resisted knee ext.   O2 stats, throughout treatment: 88-98% saturation  SLS: (B) 10 seconds  Initiated DBT rendered 5x1' throughout today's Rx to improve pt's SOB and SpO2 levels.   ??  Pain Level (0-10 scale) post treatment: 0  ??  ASSESSMENT/Changes in Function:   Patient making steady progress towards LTGs. Significant hip abd noted, but patient demo proper isolation with clams to address with minimal trunk rotation. VCs for TA draw required with most therex. SLS maintained at 10 seconds. Progressed HEP for glut and core strengthening.  ??  Patient will continue to benefit from skilled PT services to modify and progress therapeutic interventions, address functional mobility deficits, address ROM deficits, address strength deficits, analyze and address soft tissue restrictions, analyze and cue movement patterns, analyze and modify body mechanics/ergonomics, assess and modify postural abnormalities, address imbalance/dizziness and instruct in home and community integration to attain remaining goals.  ????   See Plan of Care   See progress note/recertification   See Discharge Summary  ????  Progress towards goals / Updated goals:  Short Term Goals: To be accomplished in 2-3 weeks:  1. Pt will be compliant with HEP for symptom management at home. -Goal met; patient reporting compliance (09/06/15)   2. Pt will report decreased B knee pain <=5/10 at worst being able to ambulate further distances. ??  3. Pt will demo increased B quad strength to 4+/5 with MMT to return to ADL's. -Goal progressing at 4/5 knee ext strength (09/10/15)  Long Term Goals: To be accomplished in 4-6 weeks:  1. Pt will be independent with HEP at D/C for self management.  2. Pt will increase FOTO score to >/= 54 to indicate a significant increase in activity tolerance.  3. Pt will report being able to ambulate >=20 minutes in the community without AD to return to community activities. ??    PLAN   Upgrade activities as tolerated  Continue plan of care   Update interventions per flow sheet ??   Discharge due to:_   Other:    Henderson Cloud, PTA 09/17/2015  9:02 AM

## 2015-09-20 ENCOUNTER — Inpatient Hospital Stay: Admit: 2015-09-20 | Payer: MEDICAID | Primary: Internal Medicine

## 2015-09-20 NOTE — Progress Notes (Signed)
PT DAILY TREATMENT NOTE 8-14    Patient Name: Susan [name]  Date:09/20/2015  DOB: Mar 24, 1951    Patient DOB Verified  Payor: BLUE CROSS MEDICAID / [place] [employer] / Product Type: Managed Care Medicaid /    In time: 11:33am  Out time: 12:40pm  Total Treatment Time (min): 67  Visit #: 5 of 8-12    Treatment Area: Bilateral chronic knee pain [M25.561, M25.562, G89.29]    SUBJECTIVE  Pain Level (0-10 scale): 5 in B knees  Any medication changes, allergies to medications, adverse drug reactions, diagnosis change, or new procedure performed?:  No     Yes (see summary sheet for update)  Subjective functional status/changes:    No changes reported  "I still have trouble walking for long."    OBJECTIVE  Modality rationale: decrease pain and increase tissue extensibility to improve the patient???s ability to tolerate WB activities and improve knee mobility with > ease and less pain.   Min Type Additional Details     Estim: Att   Unatt        TENS instruct                  IFC  Premod   NMES                     Other:  w/US   w/ice   w/heat  Position:  Location:      Traction:  Cervical       Lumbar                        Prone          Supine                       Intermittent   Continuous Lbs:   before manual   after manual      Ultrasound: Continuous    Pulsed                           1MHz   3MHz Location:  W/cm2:      Iontophoresis with dexamethasone         Location:  Take home patch    In clinic   10   Ice to ant knees       Heat to post knees    Ice massage Position: semi-reclined with BLEs elevated on bloster  Location: B knees      Vasopneumatic Device Pressure:        lo  med  hi   Temperature:  lo  med  hi    Skin assessment post-treatment:  intact redness- no adverse reaction       redness ??? adverse reaction:     57 min Therapeutic Exercise:   See flow sheet: added stairs and ITB stretch supine with belt   Rationale: increase ROM, increase strength, improve balance and increase  proprioception to improve the patient???s ability to ambulate with normalized gait pattern for prolonged distances    X min Patient Education:  Review HEP           Other Objective/Functional Measures:    (+) lateral patellar tracking - added ITB stretch to address  ??  Pain Level (0-10 scale) post treatment: 0  ??  ASSESSMENT/Changes in Function:   Improved stair negotiation upon cueing for push off with heel  to prevent ant sheer. Patient cont to present with significant balance deficits as unable to SLS > 5 seconds without UE assist.   ??  Patient will continue to benefit from skilled PT services to modify and progress therapeutic interventions, address functional mobility deficits, address ROM deficits, address strength deficits, analyze and address soft tissue restrictions, analyze and cue movement patterns, analyze and modify body mechanics/ergonomics, assess and modify postural abnormalities, address imbalance/dizziness and instruct in home and community integration to attain remaining goals.  ????   See Plan of Care   See progress note/recertification   See Discharge Summary  ????  Progress towards goals / Updated goals:  Short Term Goals: To be accomplished in 2-3 weeks:  1. Pt will be compliant with HEP for symptom management at home. -Goal met; patient reporting compliance (09/06/15)  2. Pt will report decreased B knee pain <=5/10 at worst being able to ambulate further distances. ??-Goal progressing at 5/10 worst today (09/20/15)   3. Pt will demo increased B quad strength to 4+/5 with MMT to return to ADL's. -Goal progressing at 4/5 knee ext strength (09/10/15)  Long Term Goals: To be accomplished in 4-6 weeks:  1. Pt will be independent with HEP at D/C for self management.  2. Pt will increase FOTO score to >/= 54 to indicate a significant increase in activity tolerance.  3. Pt will report being able to ambulate >=20 minutes in the community without AD to return to community activities. ??    PLAN    Upgrade activities as tolerated   Continue plan of care   Update interventions per flow sheet ??   Discharge due to:_   Other:    Creta Levin, PTA 09/20/2015

## 2015-09-22 ENCOUNTER — Inpatient Hospital Stay: Admit: 2015-09-22 | Payer: MEDICAID | Primary: Internal Medicine

## 2015-09-22 NOTE — Progress Notes (Signed)
PT DAILY TREATMENT NOTE 8-14    Patient Name: Susan [name]  Date:09/22/2015  DOB: 05-01-1951    Patient DOB Verified  Payor: BLUE CROSS MEDICAID / Plan: [place] / Product Type: Managed Care Medicaid /    In time: 3:30pm     Out time: 4:28pm  Total Treatment Time (min): 16  Visit #: 6 of 8-12    Treatment Area: Bilateral chronic knee pain [M25.561, M25.562, G89.29]    SUBJECTIVE  Pain Level (0-10 scale): 7; L>R  Any medication changes, allergies to medications, adverse drug reactions, diagnosis change, or new procedure performed?:  No     Yes (see summary sheet for update)  Subjective functional status/changes:    No changes reported  "I felt fine after I left last time and didn't have any pain yesterday. I tried running and hurt myself."    OBJECTIVE  Modality rationale: decrease pain and increase tissue extensibility to improve the patient???s ability to tolerate WB activities and improve knee mobility with > ease and less pain.   Min Type Additional Details     Estim: Att   Unatt        TENS instruct                  IFC  Premod   NMES                     Other:  w/US   w/ice   w/heat  Position:  Location:      Traction:  Cervical       Lumbar                        Prone          Supine                       Intermittent   Continuous Lbs:   before manual   after manual      Ultrasound: Continuous    Pulsed                           1MHz   3MHz Location:  W/cm2:      Iontophoresis with dexamethasone         Location:  Take home patch    In clinic   10   Ice to ant knees       Heat to post knees    Ice massage Position: semi-reclined with BLEs elevated on bloster  Location: B knees      Vasopneumatic Device Pressure:        lo  med  hi   Temperature:  lo  med  hi    Skin assessment post-treatment:  intact redness- no adverse reaction       redness ??? adverse reaction:     48 min Therapeutic Exercise:   See flow sheet: held std therex sec elevated pain levels    Rationale: increase ROM, increase strength, improve balance and increase proprioception to improve the patient???s ability to ambulate with normalized gait pattern for prolonged distances    X min Patient Education:  Review HEP; reviewed activity modification for pain management; reviewed use of ice for pain relief          Other Objective/Functional Measures:   ??  Pain Level (0-10 scale) post treatment: 0 in (R) knee; 5 in (  L) knee  ??  ASSESSMENT/Changes in Function:   Held most standing therex today sec elevated pain levels. Patient req additional hold times and deep breathing despite O2 use at 2L to maintain O2 saturation within appropriate ranges for therex.  ??  Patient will continue to benefit from skilled PT services to modify and progress therapeutic interventions, address functional mobility deficits, address ROM deficits, address strength deficits, analyze and address soft tissue restrictions, analyze and cue movement patterns, analyze and modify body mechanics/ergonomics, assess and modify postural abnormalities, address imbalance/dizziness and instruct in home and community integration to attain remaining goals.  ????   See Plan of Care   See progress note/recertification   See Discharge Summary  ????  Progress towards goals / Updated goals:  Short Term Goals: To be accomplished in 2-3 weeks:  1. Pt will be compliant with HEP for symptom management at home. -Goal met; patient reporting compliance (09/06/15)  2. Pt will report decreased B knee pain <=5/10 at worst being able to ambulate further distances. ??-Goal progressing at 7/10 worst today (09/22/15)   3. Pt will demo increased B quad strength to 4+/5 with MMT to return to ADL's. -Goal progressing at 4/5 knee ext strength (09/10/15)  Long Term Goals: To be accomplished in 4-6 weeks:  1. Pt will be independent with HEP at D/C for self management.  2. Pt will increase FOTO score to >/= 54 to indicate a significant increase in activity tolerance.   3. Pt will report being able to ambulate >=20 minutes in the community without AD to return to community activities. ??    PLAN   Upgrade activities as tolerated   Continue plan of care   Update interventions per flow sheet ??   Discharge due to:_   Other: cont to encourage CP use for pain relief to reduce jt swelling    Creta Levin, PTA 09/22/2015

## 2015-09-22 NOTE — Progress Notes (Signed)
[facility]   [address] [place], [place] [address]  Phone: [phone] Fax [phone]  DISCHARGE SUMMARY  Patient Name: Susan [name] DOB: [birth year]   Treatment/Medical Diagnosis: Bilateral chronic knee pain [M25.561, M25.562, G89.29]   Referral Source: Ples Specter, MD     Date of Initial Visit: 08/31/15 Attended Visits: 6 Missed Visits: 5     SUMMARY OF TREATMENT  Patient was being treated for B knee pain/ OA.  Treatment included progressive therex for hip and knee strength and balance and MHP as needed for pain relief.   CURRENT STATUS  Patient made fair progress with PT. Patient was seen for 6 visits with 5 NS visits (3 consecutive at the end). Patient reports at last attended session "I felt fine after I left last time and didn't have any pain yesterday. I tried running and hurt myself."  Unable to formally reassess all goals sec to non return, but STG as follows:  Short Term Goals: To be accomplished in 2-3 weeks:  1. Pt will be compliant with HEP for symptom management at home. -Goal met; patient reporting compliance (09/06/15)  2. Pt will report decreased B knee pain <=5/10 at worst being able to ambulate further distances. ??-Goal progressing at 7/10 worst today (09/22/15)   3. Pt will demo increased B quad strength to 4+/5 with MMT to return to ADL's. -Goal progressing at 4/5 knee ext strength (09/10/15  RECOMMENDATIONS  Discontinue therapy due to lack of attendance or compliance. patient did not return after 09/22/15  Gcode: NA  If you have any questions/comments please contact us directly at [phone].   Thank you for allowing [place] to assist in the care of your patient.  Therapist Signature: Rolene Course, PT Date: 10/13/15   Reporting Period: NA Time: 11:08 AM

## 2015-09-23 ENCOUNTER — Encounter: Attending: Internal Medicine | Primary: Internal Medicine

## 2015-09-23 ENCOUNTER — Encounter

## 2015-09-24 ENCOUNTER — Inpatient Hospital Stay: Payer: MEDICAID | Primary: Internal Medicine

## 2015-09-28 ENCOUNTER — Inpatient Hospital Stay: Payer: MEDICAID | Primary: Internal Medicine

## 2015-09-30 ENCOUNTER — Inpatient Hospital Stay: Payer: MEDICAID | Primary: Internal Medicine

## 2015-10-01 ENCOUNTER — Encounter: Payer: MEDICAID | Primary: Internal Medicine

## 2015-10-05 MED ORDER — LISINOPRIL 10 MG TAB
10 mg | ORAL_TABLET | Freq: Every day | ORAL | 3 refills | Status: DC
Start: 2015-10-05 — End: 2016-02-21

## 2015-10-05 NOTE — Telephone Encounter (Signed)
Requested Prescriptions     Pending Prescriptions Disp Refills   ??? lisinopril (PRINIVIL, ZESTRIL) 10 mg tablet       Sig: Take 1 Tab by mouth daily.

## 2015-10-07 ENCOUNTER — Ambulatory Visit
Admit: 2015-10-07 | Discharge: 2015-10-07 | Payer: PRIVATE HEALTH INSURANCE | Attending: Internal Medicine | Primary: Internal Medicine

## 2015-10-07 DIAGNOSIS — I10 Essential (primary) hypertension: Secondary | ICD-10-CM

## 2015-10-07 MED ORDER — OXYCODONE-ACETAMINOPHEN 5 MG-325 MG TAB
5-325 mg | ORAL_TABLET | Freq: Three times a day (TID) | ORAL | 0 refills | Status: DC | PRN
Start: 2015-10-07 — End: 2015-11-15

## 2015-10-07 NOTE — Patient Instructions (Signed)
1)take BP med ASAP    2) follow-up in 3 months or sooner if worsening symptoms.

## 2015-10-07 NOTE — Progress Notes (Signed)
Chief Complaint   Patient presents with   ??? Hypertension     f/u   ??? Medication Refill       HPI:     Susan [name] is a 65 y.o. African American female with history of  Hypertension and dyslipidemia here for the above complaint. She denies any chest pain, shortness of breath, abdominal pain, headaches or dizziness.     She did not take lisinopril today. She was told to take her BP med ASAP.         Past Medical History   Diagnosis Date   ??? Asthma    ??? Bipolar 1 disorder (HCC)      being followed by Dr. Donnajean Lopes   ??? Chronic obstructive pulmonary disease (HCC)    ??? Dyslipidemia    ??? Hypertension      Past Surgical History   Procedure Laterality Date   ??? Hx heent       Current Outpatient Prescriptions   Medication Sig   ??? venlafaxine-SR (EFFEXOR XR) 75 mg capsule Take  by mouth daily.   ??? oxyCODONE-acetaminophen (PERCOCET) 5-325 mg per tablet Take 1 Tab by mouth every eight (8) hours as needed for Pain. Max Daily Amount: 3 Tabs.   ??? lisinopril (PRINIVIL, ZESTRIL) 10 mg tablet Take 1 Tab by mouth daily.   ??? pravastatin (PRAVACHOL) 20 mg tablet Take 1 Tab by mouth daily.   ??? albuterol (PROVENTIL HFA, VENTOLIN HFA, PROAIR HFA) 90 mcg/actuation inhaler Take  by inhalation. Take 1-2 puffs every 4-6 hrs prn shortness of breath   ??? venlafaxine-SR (EFFEXOR-XR) 150 mg capsule Take 150 mg by mouth daily.   ??? OLANZapine (ZYPREXA) 10 mg tablet Take 10 mg by mouth nightly.   ??? albuterol (PROVENTIL VENTOLIN) 2.5 mg /3 mL (0.083 %) nebulizer solution 3 mL by Nebulization route every four (4) hours as needed for Wheezing.     No current facility-administered medications for this visit.      Health Maintenance   Topic Date Due   ??? Hepatitis C Screening  1950-12-29   ??? DTaP/Tdap/Td series (1 - Tdap) 07/18/1973   ??? PAP AKA CERVICAL CYTOLOGY  07/18/1973   ??? BREAST CANCER SCRN MAMMOGRAM  07/18/2002   ??? FOBT Q 1 YEAR AGE 14-75  07/18/2002   ??? ZOSTER VACCINE AGE 67>  07/18/2012   ??? Pneumococcal 19-64 Medium Risk  Completed    ??? INFLUENZA AGE 102 TO ADULT  Addressed     Immunization History   Administered Date(s) Administered   ??? Pneumococcal Polysaccharide (PPSV-23) 07/17/2011, 08/24/2015     No LMP recorded. Patient is postmenopausal.        Allergies and Intolerances:   Allergies   Allergen Reactions   ??? Penicillins Other (comments)     States unknown reaction        Family History:   History reviewed. No pertinent family history.    Social History:   She  reports that she has quit smoking. She has a 25.00 pack-year smoking history. She does not have any smokeless tobacco history on file.  She  reports that she does not drink alcohol.            ??     OBJECTIVE:   Physical exam:   Visit Vitals   ??? BP 142/79 (BP 1 Location: Left arm, BP Patient Position: Sitting)   ??? Pulse 79   ??? Temp 96.7 ??F (35.9 ??C) (Oral)   ??? Resp 16   ???  Ht 5\' 7"  (1.702 m)   ??? Wt 175 lb 12.8 oz (79.7 kg)   ??? SpO2 95%   ??? BMI 27.53 kg/m2        Generally: Pleasant female in no acute distress  Cardiac Exam: regular, rate, and rhythm. Normal S1 and S2. No murmurs, gallops, or rubs  Pulmonary exam: Clear to auscultation bilaterally  Abdominal exam: Positive bowel sounds in all four quadrants, soft, nondistended, nontender  Extremities: 2+ dorsalis pedis pulses bilaterally. No pedal edema    bilaterally    LABS/RADIOLOGICAL TESTS:  Lab Results   Component Value Date/Time    WBC 14.3 08/30/2015 12:42 AM    HGB 15.3 08/30/2015 12:42 AM    HCT 45.4 08/30/2015 12:42 AM    PLATELET 210 08/30/2015 12:42 AM     Lab Results   Component Value Date/Time    Sodium 137 08/30/2015 12:42 AM    Potassium 4.0 08/30/2015 12:42 AM    Chloride 99 08/30/2015 12:42 AM    CO2 25 08/30/2015 12:42 AM    CO2, TOTAL 29 10/08/2014 01:00 PM    Glucose 139 08/30/2015 12:42 AM    BUN 14 08/30/2015 12:42 AM    Creatinine 0.89 08/30/2015 12:42 AM     No results found for: CHOL, CHOLX, CHLST, CHOLV, HDL, LDL, DLDL, LDLC, DLDLP, TGL, TGLX, TRIGL, TRIGP  No results found for: GPT    Previous labs     ASSESSMENT/PLAN:    1. Benign hypertension without CHF: not well controlled because she did not take her BP med this AM. Take lisinopril ASAP, and work on diet and exercise.   -     CBC W/O DIFF; Future    2. Dyslipidemia: we will see what the labs show. Continue diet and exercise.   -     METABOLIC PANEL, COMPREHENSIVE; Future  -     LIPID PANEL; Future    3. Need for hepatitis C screening test  -     HEPATITIS C AB; Future    4. Encounter for vitamin deficiency screening  -     VITAMIN D, 25 HYDROXY; Future    5. Chronic pain of both knees  -     oxyCODONE-acetaminophen (PERCOCET) 5-325 mg per tablet; Take 1 Tab by mouth every eight (8) hours as needed for Pain. Max Daily Amount: 3 Tabs.    6.   Requested Prescriptions     Signed Prescriptions Disp Refills   ??? oxyCODONE-acetaminophen (PERCOCET) 5-325 mg per tablet 90 Tab 0     Sig: Take 1 Tab by mouth every eight (8) hours as needed for Pain. Max Daily Amount: 3 Tabs.     7. Patient verbalized understanding and agreement with the plan.    8. Patient was given an after-visit summary.    9. Follow-up Disposition:  Return in about 3 months (around 01/05/2016) for f/u HTN/lipids. or sooner if worsening symptoms.          Marsh DollySemret Halo Laski, MD

## 2015-10-07 NOTE — Progress Notes (Signed)
ROOM # 3    Ceasar MonsDenise C Nobrega presents today for   Chief Complaint   Patient presents with   ??? Hypertension     f/u   ??? Medication Refill       Ceasar Monsenise C Ocampo preferred language for health care discussion is english/other.    Is someone accompanying this pt? no    Is the patient using any DME equipment during OV? no    Depression Screening:  PHQ 2 / 9, over the last two weeks 02/18/2015   Little interest or pleasure in doing things Not at all   Feeling down, depressed or hopeless Not at all   Total Score PHQ 2 0       Learning Assessment:  Learning Assessment 02/18/2015   PRIMARY LEARNER Patient   HIGHEST LEVEL OF EDUCATION - PRIMARY LEARNER  DID NOT GRADUATE HIGH SCHOOL   BARRIERS PRIMARY LEARNER NONE   CO-LEARNER CAREGIVER No   PRIMARY LANGUAGE ENGLISH   INTERPRETER NEED No   LEARNER PREFERENCE PRIMARY DEMONSTRATION   LEARNING SPECIAL TOPICS no   ANSWERED BY patient   RELATIONSHIP SELF   ASSESSMENT COMMENT none       Abuse Screening:  No flowsheet data found.    Fall Risk  No flowsheet data found.    Health Maintenance reviewed and discussed per provider. Yes    Ceasar MonsDenise C Journey is due for multiple, see hm due.  Please order/place referral if appropriate.      Advance Directive:  1. Do you have an advance directive in place? Patient Reply: no    2. If not, would you like material regarding how to put one in place? Patient Reply: no    Coordination of Care:  1. Have you been to the ER, urgent care clinic since your last visit?  Hospitalized since your last visit? [facility] ED for pneumonia    2. Have you seen or consulted any other health care providers outside of the [facility] since your last visit? Include any pap smears or colon screening. no

## 2015-10-20 ENCOUNTER — Encounter: Payer: MEDICAID | Primary: Internal Medicine

## 2015-10-22 NOTE — Telephone Encounter (Signed)
Incoming call from pt at home number.  2 pt identifiers confirmed.  Pt states she needs to schedule an appointment to see Dr Gwenith Daily next week because she has been having intermittent numbness and pain to her right arm.  Pt states she has been having the numbness off and on daily since after her last appointment with MD 2 weeks ago.  Pt states she needs to schedule and appointment 5 days out so that she can arrange transportation.  Pt advised that if the numbness continues she needs to be seen in ER ASAP.  Pt verbalized understanding.  Pt denies any chest pain or shortness of breath.  Pt also reports she has not began taking BP medication refilled at last appointment 10/07/2015.  Pt advised that MD wanted her to pick up medication and began taking ASAP.  Pt stated she has not had the money to pick up medication but she will try to get prescription filled today.  Pt scheduled for appointment with Dr Gwenith Daily on Wednesday Oct 27, 2015 at 10:30 am.  No other questions or concerns at this time.    Dr Gwenith Daily please be advised.

## 2015-10-26 IMAGING — CR DG CHEST 2V
2 series · 2 of 2 positions shown · non-contrast
Comparison: None.

CLINICAL DATA: Chronic asthma, shortness of breath, cough.
Increased chest tightness. Symptoms starting earlier today.

EXAM:
CHEST  2 VIEW

[w chest pa]
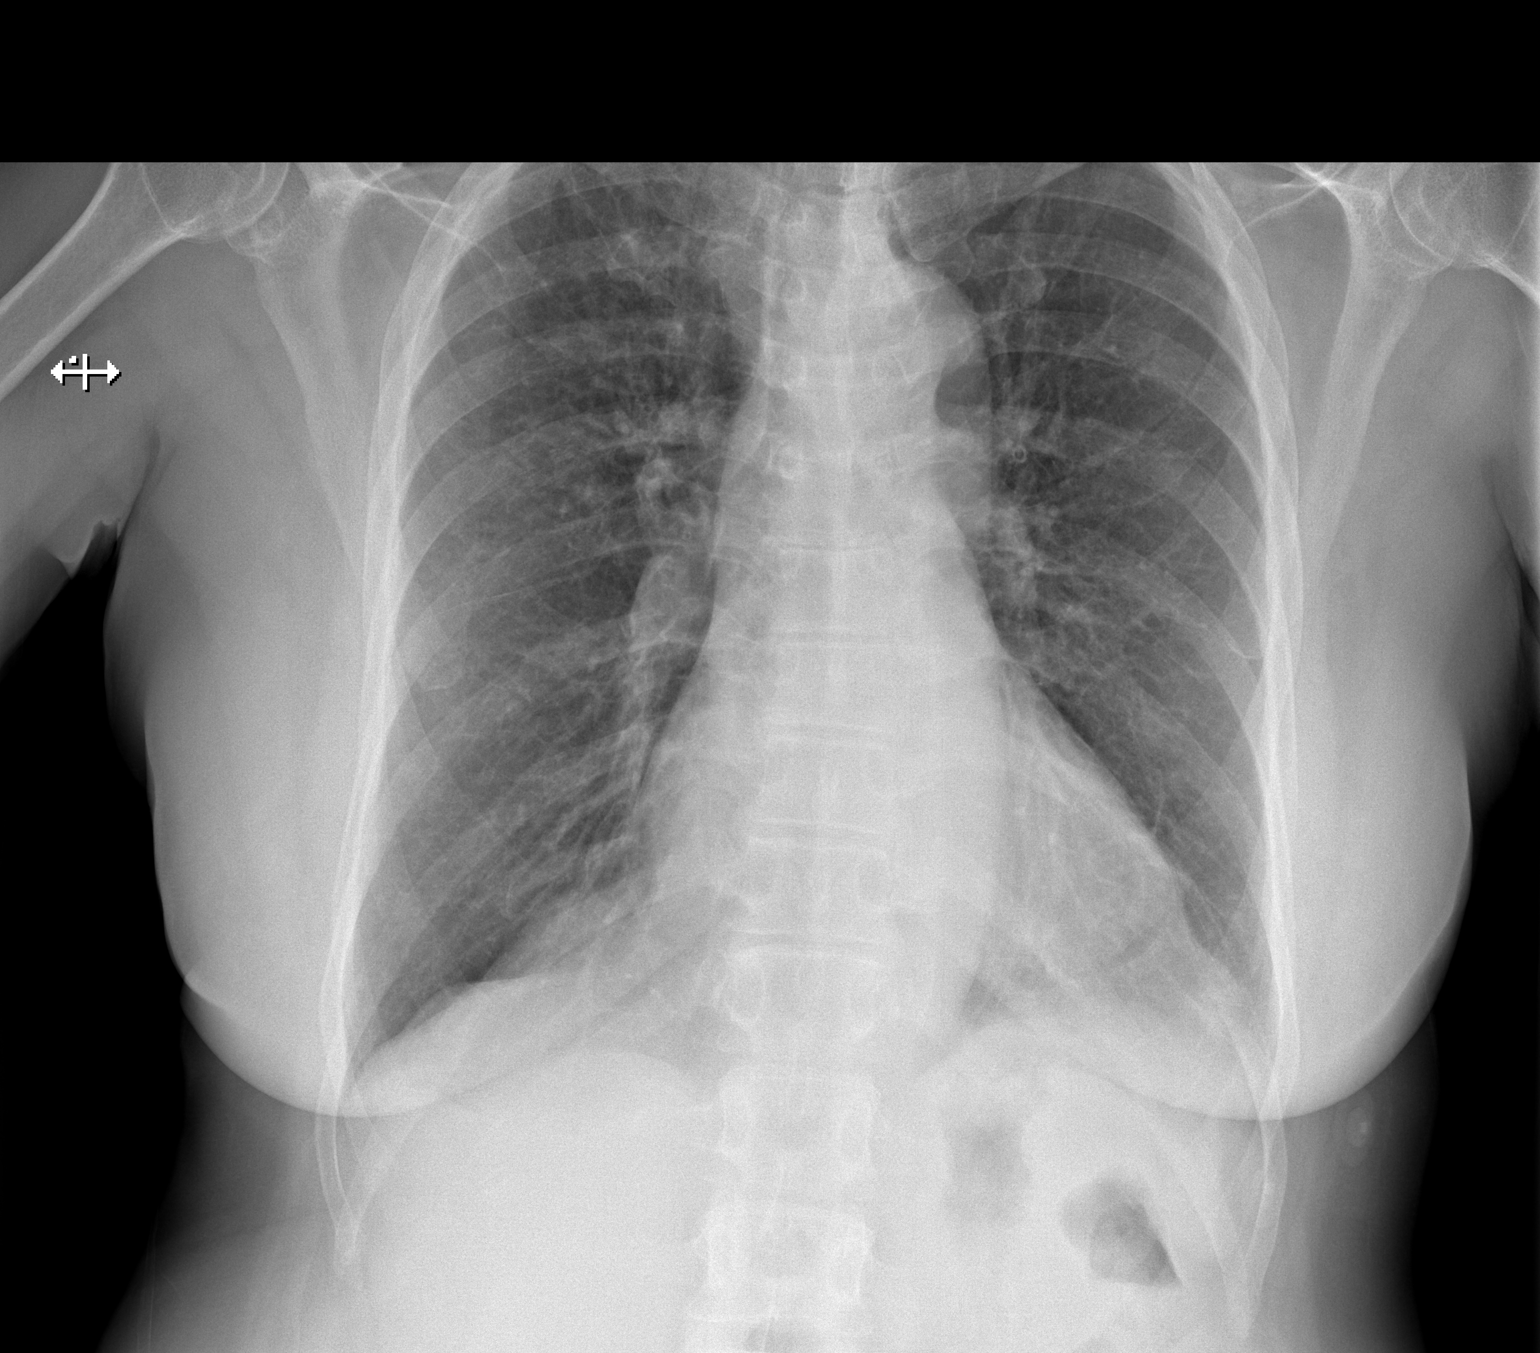

[w chest lat]
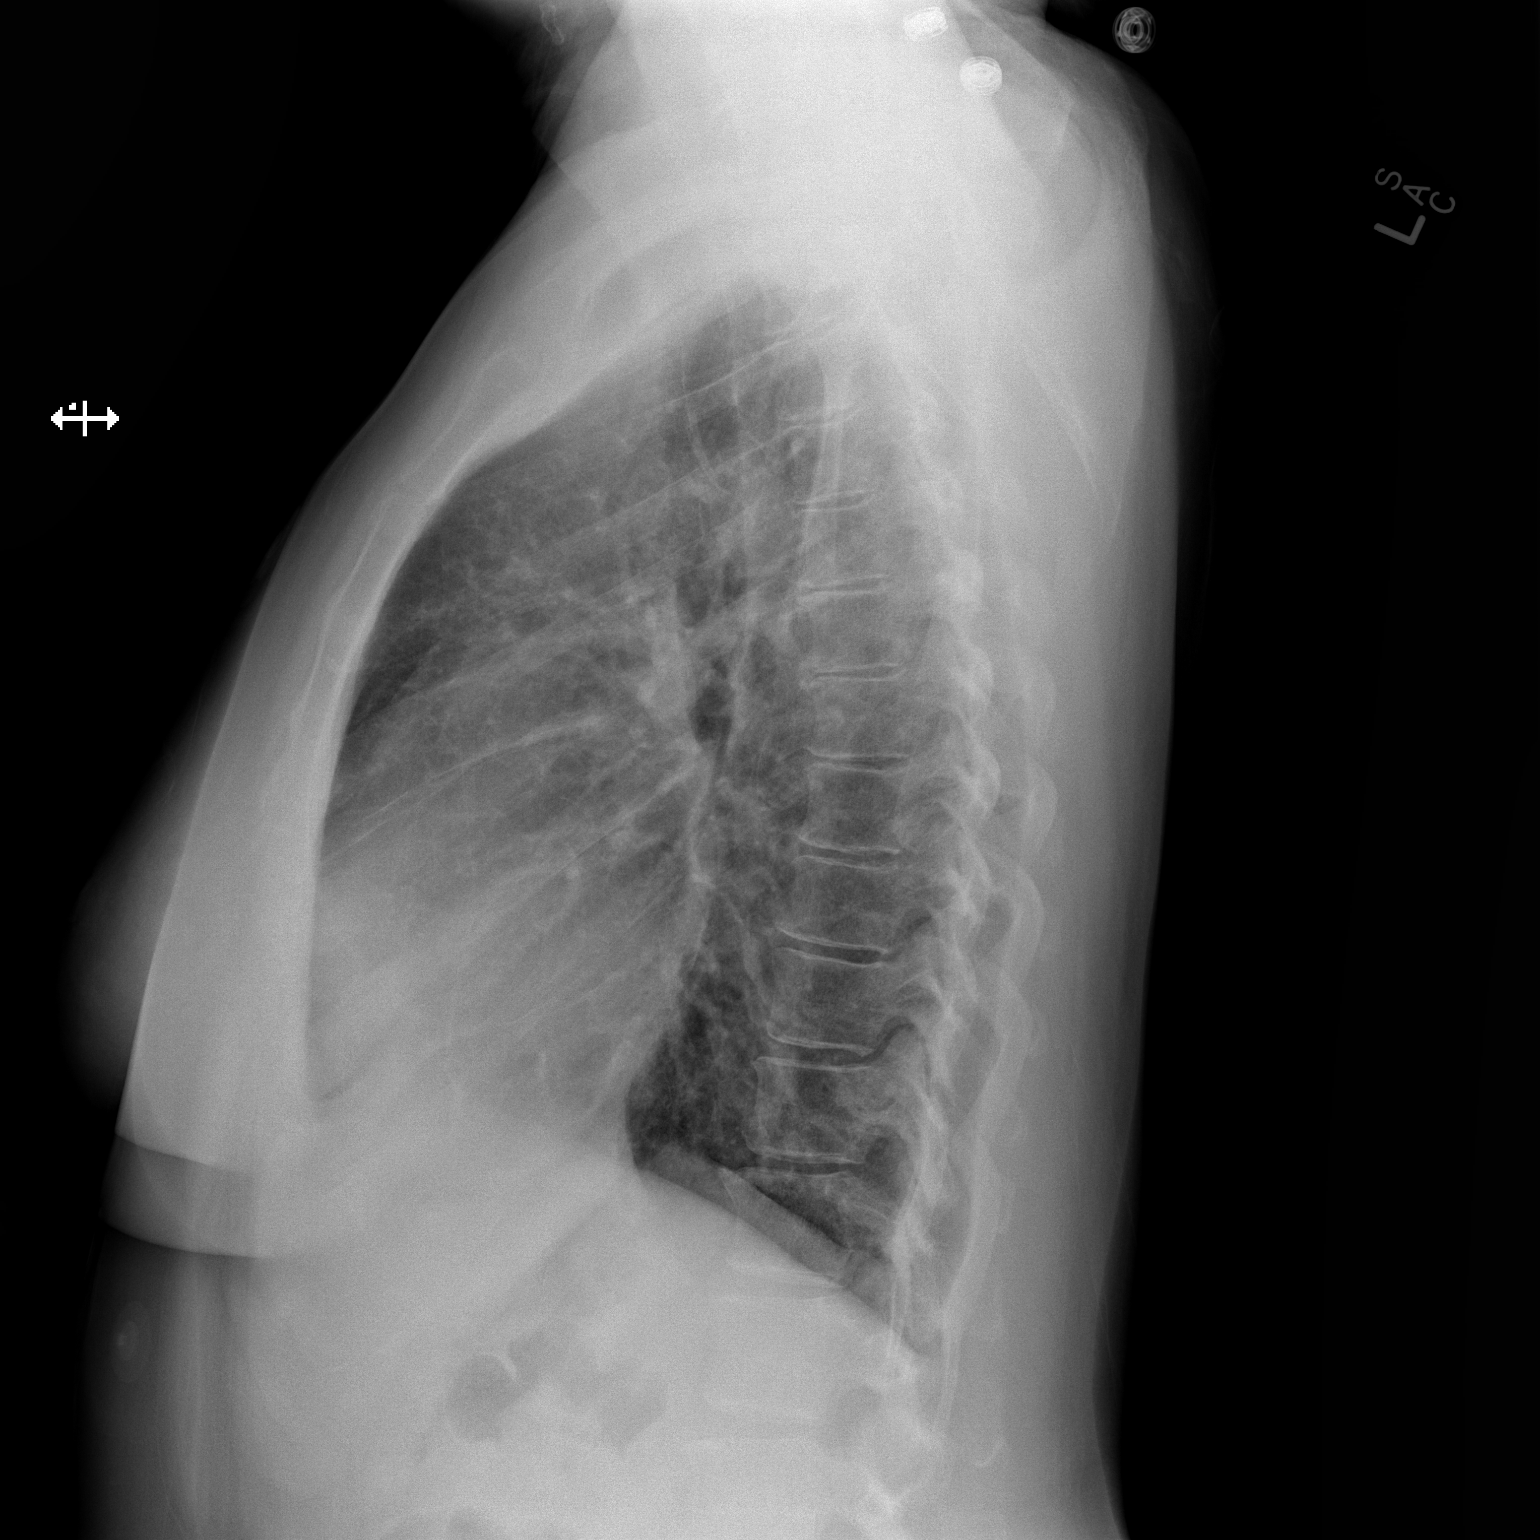

[2 of 2 positions shown; findings below may reference images not displayed]

FINDINGS: There is hyperinflation of the lungs compatible with COPD.
Peribronchial cuffing, likely chronic bronchitis. No confluent
opacities or effusions. No acute bony abnormality.
IMPRESSION: COPD/ chronic bronchitis.  No active disease.

## 2015-10-27 ENCOUNTER — Telehealth

## 2015-10-27 ENCOUNTER — Ambulatory Visit
Admit: 2015-10-27 | Discharge: 2015-10-27 | Payer: PRIVATE HEALTH INSURANCE | Attending: Internal Medicine | Primary: Internal Medicine

## 2015-10-27 DIAGNOSIS — M25511 Pain in right shoulder: Secondary | ICD-10-CM

## 2015-10-27 NOTE — Progress Notes (Signed)
Chief Complaint   Patient presents with   ??? Numbness     Right arm       HPI:     Susan [name] is a 65 y.o. African American female with history of hypertension and dyslipidemia   here for the above complaint. She has right shoulder pain and numbness and tingling that radiates right arm to fingers. Pain scale: 10/10. She has been using the percocet for the pain. She said sometimes she cannot turn or move. This has been going on for more than 2 weeks. No trauma. Throbbing pain in shoulder. No neck pain. Pt denies any chest pain, shortness of breath, abdominal pain, dizziness. She has some headaches. She did not take her BP med this AM.             Past Medical History   Diagnosis Date   ??? Asthma    ??? Bipolar 1 disorder (HCC)      being followed by Dr. Donnajean Lopes   ??? Chronic obstructive pulmonary disease (HCC)    ??? Dyslipidemia    ??? Hypertension      Past Surgical History   Procedure Laterality Date   ??? Hx heent       Current Outpatient Prescriptions   Medication Sig   ??? venlafaxine-SR (EFFEXOR XR) 75 mg capsule Take  by mouth daily.   ??? oxyCODONE-acetaminophen (PERCOCET) 5-325 mg per tablet Take 1 Tab by mouth every eight (8) hours as needed for Pain. Max Daily Amount: 3 Tabs.   ??? lisinopril (PRINIVIL, ZESTRIL) 10 mg tablet Take 1 Tab by mouth daily.   ??? pravastatin (PRAVACHOL) 20 mg tablet Take 1 Tab by mouth daily.   ??? venlafaxine-SR (EFFEXOR-XR) 150 mg capsule Take 150 mg by mouth daily.   ??? albuterol (PROVENTIL HFA, VENTOLIN HFA, PROAIR HFA) 90 mcg/actuation inhaler Take  by inhalation. Take 1-2 puffs every 4-6 hrs prn shortness of breath   ??? OLANZapine (ZYPREXA) 10 mg tablet Take 10 mg by mouth nightly.   ??? albuterol (PROVENTIL VENTOLIN) 2.5 mg /3 mL (0.083 %) nebulizer solution 3 mL by Nebulization route every four (4) hours as needed for Wheezing.     No current facility-administered medications for this visit.      Health Maintenance   Topic Date Due   ??? Hepatitis C Screening  [birth year]    ??? DTaP/Tdap/Td series (1 - Tdap) 07/18/1973   ??? PAP AKA CERVICAL CYTOLOGY  07/18/1973   ??? BREAST CANCER SCRN MAMMOGRAM  07/18/2002   ??? FOBT Q 1 YEAR AGE 3-75  07/18/2002   ??? ZOSTER VACCINE AGE 39>  07/18/2012   ??? Pneumococcal 19-64 Medium Risk  Completed   ??? INFLUENZA AGE 55 TO ADULT  Addressed     Immunization History   Administered Date(s) Administered   ??? Pneumococcal Polysaccharide (PPSV-23) 07/17/2011, 08/24/2015     No LMP recorded. Patient is postmenopausal.        Allergies and Intolerances:   Allergies   Allergen Reactions   ??? Penicillins Other (comments)     States unknown reaction        Family History:   History reviewed. No pertinent family history.    Social History:   She  reports that she has quit smoking. She has a 25.00 pack-year smoking history. She does not have any smokeless tobacco history on file.  She  reports that she does not drink alcohol.          ??  OBJECTIVE:   Physical exam:   Visit Vitals   ??? BP 160/90 (BP 1 Location: Left arm, BP Patient Position: Sitting)   ??? Pulse 65   ??? Temp 98.2 ??F (36.8 ??C) (Oral)   ??? Resp 18   ??? Ht  (1.702 m)   ??? Wt 176 lb 3.2 oz (79.9 kg)   ??? SpO2 94%   ??? BMI 27.6 kg/m2        Generally: Pleasant female in no acute distress  Cardiac Exam: regular, rate, and rhythm. Normal S1 and S2. No murmurs, gallops, or rubs  Pulmonary exam: Clear to auscultation bilaterally  Right shoulder exam: TTP in the shoulder area with decrease ROM with across the chest, upward, back.   Strength: 5/5 in upper extremities bilaterally  Decrease sensation on right arm compared to left with touch.   Reflexes: 2/2 in lower extremities bilaterally    LABS/RADIOLOGICAL TESTS:  none      ASSESSMENT/PLAN:    1. Acute pain of right shoulder: continue the percocet, use heating pad/icy/hot, tiger balm for pain. Given exercises. She can continue the percocet  -     MRI SHOULDER RT WO CONT; Future    2. Patient verbalized understanding and agreement with the plan.     3. Patient was given an after-visit summary.    4.   Follow-up Disposition:  Return if symptoms worsen or fail to improve. or sooner if worsening symptoms.          Marsh Dolly, MD

## 2015-10-27 NOTE — Telephone Encounter (Signed)
Patient needs new order for physical therapy. She stated she missed to many appointments

## 2015-10-27 NOTE — Progress Notes (Signed)
ROOM # 1    Pt presents today for complaint of Numbness in Right arm    Pt preferred language for health care discussion is english.    Is someone accompanying this pt? no    Is the patient using any DME equipment during OV? no    Depression Screening completed.  Active Dx    Learning Assessment completed. Yes    Abuse Screening completed. Yes    Health Maintenance reviewed and discussed per provider. Yes    Pt is due for Colonoscopy, Mammogram, Pap, Hep C screen,Tdap, Zostavax.  Please order/place referral if appropriate.    Advance Directive:  1. Do you have an advance directive in place? Patient Reply: No    2. If not, would you like material regarding how to put one in place? Patient Reply: No    Coordination of Care:  1. Have you been to the ER, urgent care clinic since your last visit? Hospitalized since your last visit? No    2. Have you seen or consulted any other health care providers outside of the [facility] since your last visit? Include any pap smears or colon screening. No

## 2015-10-27 NOTE — Patient Instructions (Addendum)
1) use heating pad, icy/hot, tiger balm for pain      2) follow-up as needed or sooner if worsening symptoms.     3) CAN USE PERCOCET FOR PAIN     4) TAKE BP meds ASAP  Shoulder Arthritis: Exercises  Your Care Instructions  Here are some examples of typical rehabilitation exercises for your condition. Start each exercise slowly. Ease off the exercise if you start to have pain.  Your doctor or physical therapist will tell you when you can start these exercises and which ones will work best for you.  How to do the exercises  Shoulder flexion (lying down)    Note: To make a wand for this exercise, use a piece of PVC pipe or a broom handle with the broom removed. Make the wand about a foot wider than your shoulders.  1. Lie on your back, holding a wand with both hands. Your palms should face down as you hold the wand.  2. Keeping your elbows straight, slowly raise your arms over your head. Raise them until you feel a stretch in your shoulders, upper back, and chest.  3. Hold for 15 to 30 seconds.  4. Repeat 2 to 4 times.  Shoulder rotation (lying down)    Note: To make a wand for this exercise, use a piece of PVC pipe or a broom handle with the broom removed. Make the wand about a foot wider than your shoulders.  1. Lie on your back. Hold a wand with both hands with your elbows bent and palms up.  2. Keep your elbows close to your body, and move the wand across your body toward the sore arm.  3. Hold for 8 to 12 seconds.  4. Repeat 2 to 4 times.  Shoulder internal rotation with towel    1. Hold a towel above and behind your head with the arm that is not sore.  2. With your sore arm, reach behind your back and grasp the towel.  3. With the arm above your head, pull the towel upward. Do this until you feel a stretch on the front and outside of your sore shoulder.  4. Hold 15 to 30 seconds.  5. Repeat 2 to 4 times.  Shoulder blade squeeze    1. Stand with your arms at your sides, and squeeze your shoulder blades  together. Do not raise your shoulders up as you squeeze.  2. Hold 6 seconds.  3. Repeat 8 to 12 times.  Resisted rows    Note: For this exercise, you will need elastic exercise material, such as surgical tubing or Thera-Band.  1. Put the band around a solid object at about waist level. (A bedpost will work well.) Each hand should hold an end of the band.  2. With your elbows at your sides and bent to 90 degrees, pull the band back. Your shoulder blades should move toward each other. Return to the starting position.  3. Repeat 8 to 12 times.  External rotator strengthening exercise    1. Start by tying a piece of elastic exercise material to a doorknob. You can use surgical tubing or Thera-Band. (You may also hold one end of the band in each hand.)  2. Stand or sit with your shoulder relaxed and your elbow bent 90 degrees. Your upper arm should rest comfortably against your side. Squeeze a rolled towel between your elbow and your body for comfort. This will help keep your arm at your side.  3.  Hold one end of the elastic band with the hand of the painful arm.  4. Start with your forearm across your belly. Slowly rotate the forearm out away from your body. Keep your elbow and upper arm tucked against the towel roll or the side of your body until you begin to feel tightness in your shoulder. Slowly move your arm back to where you started.  5. Repeat 8 to 12 times.  Internal rotator strengthening exercise    1. Start by tying a piece of elastic exercise material to a doorknob. You can use surgical tubing or Thera-Band.  2. Stand or sit with your shoulder relaxed and your elbow bent 90 degrees. Your upper arm should rest comfortably against your side. Squeeze a rolled towel between your elbow and your body for comfort. This will help keep your arm at your side.  3. Hold one end of the elastic band in the hand of the painful arm.  4. Slowly rotate your forearm toward your body until it touches your  belly. Slowly move it back to where you started.  5. Keep your elbow and upper arm firmly tucked against the towel roll or at your side.  6. Repeat 8 to 12 times.  Pendulum swing    Note: If you have pain in your back, do not do this exercise.  1. Hold on to a table or the back of a chair with your good arm. Then bend forward a little and let your sore arm hang straight down. This exercise does not use the arm muscles. Rather, use your legs and your hips to create movement that makes your arm swing freely.  2. Use the movement from your hips and legs to guide the slightly swinging arm back and forth like a pendulum (or elephant trunk). Then guide it in circles that start small (about the size of a dinner plate). Make the circles a bit larger each day, as your pain allows.  3. Do this exercise for 5 minutes, 5 to 7 times each day.  4. As you have less pain, try bending over a little farther to do this exercise. This will increase the amount of movement at your shoulder.  Follow-up care is a key part of your treatment and safety. Be sure to make and go to all appointments, and call your doctor if you are having problems. It's also a good idea to know your test results and keep a list of the medicines you take.  Where can you learn more?  Go to [employer].  Enter H562 in the search box to learn more about "Shoulder Arthritis: Exercises."  Current as of: Feb 22, 2015  Content Version: 11.1  ?? 2006-2016 [employer]. Care instructions adapted under license by [employer] (which disclaims liability or warranty for this information). If you have questions about a medical condition or this instruction, always ask your healthcare professional. [employer] disclaims any warranty or liability for your use of this information.

## 2015-10-28 LAB — HEPATITIS C ANTIBODY: HCV Ab: <0.1 s/co ratio (ref 0.0–0.9)

## 2015-10-28 LAB — CVD REPORT

## 2015-10-28 LAB — METABOLIC PANEL, COMPREHENSIVE
A-G Ratio: 1.5 (ref 1.1–2.5)
ALT (SGPT): 16 IU/L (ref 0–32)
AST (SGOT): 19 IU/L (ref 0–40)
Albumin: 4.4 g/dL (ref 3.6–4.8)
Alk. phosphatase: 77 IU/L (ref 39–117)
BUN/Creatinine ratio: 25 (ref 11–26)
BUN: 22 mg/dL (ref 8–27)
Bilirubin, total: 0.3 mg/dL (ref 0.0–1.2)
CO2: 28 mmol/L (ref 18–29)
Calcium: 9.8 mg/dL (ref 8.7–10.3)
Chloride: 97 mmol/L (ref 96–106)
Creatinine: 0.87 mg/dL (ref 0.57–1.00)
GFR est AA: 82 mL/min/{1.73_m2} (ref >59)
GFR est non-AA: 71 mL/min/{1.73_m2} (ref >59)
GLOBULIN, TOTAL: 2.9 g/dL (ref 1.5–4.5)
Glucose: 96 mg/dL (ref 65–99)
Potassium: 4.4 mmol/L (ref 3.5–5.2)
Protein, total: 7.3 g/dL (ref 6.0–8.5)
Sodium: 140 mmol/L (ref 134–144)

## 2015-10-28 LAB — CBC W/O DIFF
HCT: 41.6 % (ref 34.0–46.6)
HGB: 13.8 g/dL (ref 11.1–15.9)
MCH: 31.9 pg (ref 26.6–33.0)
MCHC: 33.2 g/dL (ref 31.5–35.7)
MCV: 96 fL (ref 79–97)
PLATELET: 227 10*3/uL (ref 150–379)
RBC: 4.32 x10E6/uL (ref 3.77–5.28)
RDW: 14.2 % (ref 12.3–15.4)
WBC: 4.2 10*3/uL (ref 3.4–10.8)

## 2015-10-28 LAB — LIPID PANEL
Cholesterol, total: 245 mg/dL — ABNORMAL HIGH (ref 100–199)
HDL Cholesterol: 69 mg/dL (ref >39)
LDL, calculated: 166 mg/dL — ABNORMAL HIGH (ref 0–99)
Triglyceride: 52 mg/dL (ref 0–149)
VLDL, calculated: 10 mg/dL (ref 5–40)

## 2015-10-28 LAB — HEPATITIS C AB: Hep C Virus Ab: <0.1 s/co ratio (ref 0.0–0.9)

## 2015-10-28 LAB — VITAMIN D, 25 HYDROXY: VITAMIN D, 25-HYDROXY: 16.6 ng/mL — ABNORMAL LOW (ref 30.0–100.0)

## 2015-10-28 NOTE — Telephone Encounter (Signed)
Please advise.

## 2015-10-28 NOTE — Progress Notes (Signed)
Please let pt know that labs were normal except:    1) lipids are high. Has she been taking the pravastatin  everyday? If she is, need to do med adjustments?    2) she needs to work on diet and exercise.     3 mail low chol diet information

## 2015-10-28 NOTE — Telephone Encounter (Signed)
Referral generated in connect care.

## 2015-10-29 NOTE — Telephone Encounter (Signed)
Pt contacted at home number.  2 pt identifiers confirmed.  Pt informed of below.  Pt verbalized understanding.  Pt states she has not been taking her Pravastatin because she was not aware they were at pharm.  Pt states she will pick up medication from pharm and begin taking.  No other questions at this time.

## 2015-10-29 NOTE — Telephone Encounter (Signed)
-----   Message from Rosalio Loud, MD sent at 10/28/2015  1:48 PM EST -----  Please let pt know that labs were normal except:    1) lipids are high. Has she been taking the pravastatin  everyday? If she is, need to do med adjustments?    2) she needs to work on diet and exercise.     3 mail low chol diet information

## 2015-11-03 ENCOUNTER — Telehealth

## 2015-11-03 NOTE — Telephone Encounter (Signed)
Susan [name] from [facility] scheduling needs a nurse to call her at [phone] and she is canceling patient MRI for tomorrow

## 2015-11-04 ENCOUNTER — Ambulatory Visit: Payer: MEDICAID | Primary: Internal Medicine

## 2015-11-04 NOTE — Telephone Encounter (Signed)
Called Susan [name] who stated that the MRI requires a peer to peer stated understanding and informed Dr Gwenith Daily who stated understanding.  No other questions or concerns noted at this time.

## 2015-11-04 NOTE — Telephone Encounter (Signed)
Called pt informed of below she stated understanding no other questions or concerns noted at this time.

## 2015-11-04 NOTE — Telephone Encounter (Signed)
Incoming call from pt stating that her MRI had been cancelled that she needed the office to do something stated understanding

## 2015-11-05 NOTE — Telephone Encounter (Signed)
Order in connect care for right shoulder x-ray. Will try this first and then order MRI.

## 2015-11-07 ENCOUNTER — Encounter

## 2015-11-15 ENCOUNTER — Encounter

## 2015-11-15 ENCOUNTER — Inpatient Hospital Stay: Admit: 2015-11-15 | Payer: MEDICAID | Primary: Internal Medicine

## 2015-11-15 DIAGNOSIS — M25561 Pain in right knee: Secondary | ICD-10-CM

## 2015-11-15 MED ORDER — OXYCODONE-ACETAMINOPHEN 5 MG-325 MG TAB
5-325 mg | ORAL_TABLET | Freq: Three times a day (TID) | ORAL | 0 refills | Status: DC | PRN
Start: 2015-11-15 — End: 2015-12-16

## 2015-11-15 NOTE — Telephone Encounter (Signed)
Printed rx for:    Requested Prescriptions     Signed Prescriptions Disp Refills   ??? oxyCODONE-acetaminophen (PERCOCET) 5-325 mg per tablet 90 Tab 0     Sig: Take 1 Tab by mouth every eight (8) hours as needed for Pain. Max Daily Amount: 3 Tabs.     Authorizing Provider: Marsh Dolly T     Please let pt know that this ready for pick up.

## 2015-11-15 NOTE — Progress Notes (Signed)
[facility]   [address] [place], [place] [address]  Phone: [phone] Fax: [phone]  PLAN OF CARE / STATEMENT OF MEDICAL NECESSITY FOR PHYSICAL THERAPY SERVICES  Patient Name: Susan [name] DOB: 10/31/1950   Medical   Diagnosis: Bilateral chronic knee pain [M25.561, M25.562, G89.29] Treatment Diagnosis: B knee pain, L greater than R    Onset Date: 2013/ ongoing      Referral Source: Rosalio Loud, MD Start of Care [facility]): 11/15/2015   Prior Hospitalization: See medical history Provider #: [id number]   Prior Level of Function: Functional I    Comorbidities: Premature birth, depression, arthritis, HTN, COPD/ asthma    Medications: Verified on Patient Summary List   The Plan of Care and following information is based on the information from the initial evaluation.   ========================================================================  Assessment / key information:  Pt is a 65 y.o. year old female who presents with co B leg/ knee pain sec to hx of arthritis; reports limitations in function starting 2013 when she was having to walk 3 hour + to and from work from [place] to [place]. 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 Patient reports 3-4 falls in the last year sec to LE giving out. Patient currently uses 2L O2 as needed sec to hx of COPD.  Current deficits include: increased pain to 10/10 at worst, decreased flexibility of surrounding knee joint L, decreased B hip and core strength per chart below, hip ER posture at rest/ hip IR decrease AROM,  Functional deficits include: walking tolerance: approx 2 blocks, stair negotiation, household activity tolerance: 20 min, lifting, intermittent sleep interruption .  Home exercise program initiated on initial evaluation to address these deficits. Pt would benefit from PT to address these deficits for increased  functional mobility and QOL.      AROM     Strength   ?? ?? Left Right Left Right   Hip Flexion ?? ?? 3 4+   ?? Extension ?? ?? 3 3   ?? Abduction ?? ?? 3+ 4   ?? IR decreased ?? ?? ??   Knee Flexion 127 127 4 5   ?? Extension 0 0 4 decreased quad contraction with QS 5   Ankle Plantarflexion ?? ?? ?? ??   ?? Dorsiflexion neutral  ?? ?? ??     ========================================================================  Eval Complexity: History: HIGH Complexity :3+ comorbidities / personal factors will impact the outcome/ POC Exam:HIGH Complexity : 4+ Standardized tests and measures addressing body structure, function, activity limitation and / or participation in recreation  Presentation: MEDIUM Complexity : Evolving with changing characteristics  Clinical Decision Making:MEDIUM Complexity : FOTO score of 26-74Overall Complexity:MEDIUM  Problem List: pain affecting function, decrease ROM, decrease strength, impaired gait/ balance, decrease ADL/ functional abilitiies, decrease activity tolerance and decrease flexibility/ joint mobility   Treatment Plan may include any combination of the following: Therapeutic exercise, Neuromuscular re-education, Physical agent/modality, Gait/balance training, Manual therapy and Patient education  Patient / Family readiness to learn indicated by: asking questions, trying to perform skills and interest  Persons(s) to be included in education: patient (P)  Barriers to Learning/Limitations: None  Measures taken:    Patient Goal (s): "that pain doesn't hurt"   Patient self reported health status: good  Rehabilitation Potential: good  ? Short Term Goals: To be accomplished in  1  weeks:  1.  Pt will be independent and compliant with HEP  ? Long Term Goals: To be accomplished in  8-12  treatments:  1.  Patient will increase FOTO score to 54/100 for indications of increased functional mobility.    2.  Patient will demo increased hip ABD strength B to 4/5 for improved stability with gait    3. Patient will demo 100% bridge to indicate improved core strength with ADLs and prolonged amb   4. Patient will report increased walking tolerance to 3 blocks for ease with community mobility   Frequency / Duration:   Patient to be seen  2  times per week for 8-12  treatments:  Patient / Caregiver education and instruction: self care, activity modification and exercises  G-Codes (GP): NA  Therapist Signature: Cecilio Asper, PT Date: 11/15/2015   Certification Period: na Time: 6:40 AM   ========================================================================  I certify that the above Physical Therapy Services are being furnished while the patient is under my care.  I agree with the treatment plan and certify that this therapy is necessary.  Physician Signature:        Date:       Time:   Please sign and return to [facility] or you may fax the signed copy to [phone].  Thank you.

## 2015-11-15 NOTE — Telephone Encounter (Signed)
Requested Prescriptions     Pending Prescriptions Disp Refills   ??? oxyCODONE-acetaminophen (PERCOCET) 5-325 mg per tablet 90 Tab 0     Sig: Take 1 Tab by mouth every eight (8) hours as needed for Pain. Max Daily Amount: 3 Tabs.

## 2015-11-15 NOTE — Progress Notes (Signed)
PT KNEE EVAL AND TREATMENT    Patient Name: Susan [name]  Date:11/15/2015  DOB: 08/18/1951    Patient DOB Verified  Payor: [employer]UE CROSS MEDICAID / [place] [employer] [place] / Product Type: Managed Care Medicaid /    In time:939  Out time:1036  Total Treatment Time (min): 57  Visit #: 1 of 8-12    Treatment Area: Bilateral chronic knee pain [M25.561, M25.562, G89.29]    SUBJECTIVE  Pain Level (0-10 scale): (C): 7.5  (B):  5(W):  10  Any medication changes, allergies to medications, diagnosis change, or new procedure performed: see summary sheet for update  Subjective functional status/changes:  CHIEF COMPLAINT: Patient co B leg/ knee pain sec to hx of arthritis; reports limitations in function starting 2013 when she was having to walk 3 hour + to and from work from [place] to [place]. 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 Patient reports 3-4 falls in the last year sec to LE giving out.  Patient currently uses 2L O2 as needed sec to hx of COPD.   DEFICITS: walking tolerance: approx 2 blocks, stair negotiation, household activity tolerance: 20 min, lifting, intermittent sleep interruption      OBJECTIVE  Posture: supine- L hip falls into excessive hip ER     Gait:  Decreased cadence     ROM / Strength                            AROM                      Strength (1-5)    Left Right Left Right   Hip Flexion   3 4+    Extension   3 3    Abduction   3+ 4    IR decreased      Knee Flexion 127 127 4 5    Extension 0 0 4 decreased quad contraction with QS 5   Ankle Plantarflexion        Dorsiflexion neutral         Flexibility:   Hamstrings:  90/90 HS test positive L   Quadriceps: Eli Test positive L for ant knee pain   Gastroc:   Neutral DF on L   Other:hip ER tight limited IR     Palpation:    Optional Tests:  Patellar Positioning (Static) WNL   Patellar Tracking WNL      Patellar Mobility hypo L         Girth Measurements: none present today          Other tests/comments:  Poor core strength 50% bridge with challenge     Manual: - min     Modality (rationale): PROMOTE L QUAD RE EDUCATION     E-Stim: type [place]  x 10 min     unatt  - 5 on 10 off with QS    w/ice     Patient Education:  Established HEP and completed in clinic      POT (minutes) :20    Pain Level (0-10 scale) post treatment: 6  ASSESSMENT    See Plan of Care    PLAN    Upgrade activities as tolerated      Other:_ POC  2x8-12    Cecilio Asper, PT 11/15/2015  6:40 AM  Justification for Eval Code Complexity:  Patient History : poor compliance with previous treatments, chronicity, structural deficits   Examination see exam as above   Clinical Presentation: evolving sec to hx   Clinical Decision Making : FOTO 42/100

## 2015-11-23 ENCOUNTER — Inpatient Hospital Stay: Admit: 2015-11-23 | Payer: MEDICAID | Primary: Internal Medicine

## 2015-11-23 NOTE — Progress Notes (Signed)
PT DAILY TREATMENT NOTE 8-14    Patient Name: Susan [name]  Date:11/23/2015  DOB: Dec 23, 1950    Patient DOB Verified  Payor: [employer]ICA[employer] / [place] [employer] / Product Type: Managed Care Medicaid /    In time: 1:01pm      Out time:  1:42pm  Total Treatment Time (min): 41   Visit #: 2 of 8-12    Treatment Area: Bilateral chronic knee pain [M25.561, M25.562, G89.29]    SUBJECTIVE  Pain Level (0-10 scale): 7  Any medication changes, allergies to medications, adverse drug reactions, diagnosis change, or new procedure performed?:  No     Yes (see summary sheet for update)  Subjective functional status/changes:    No changes reported  "I did the exercises on Sunday. I have tingling in my pinky toe." Patient arrived without O2 tank.    OBJECTIVE  Modality rationale: increase muscle contraction/control to improve the patient???s ability to improve VMO control for prolonged ambulation   Min Type Additional Details   10  Estim: Att   Unatt        TENS instruct                  IFC  Premod   NMES                     Other:  w/US   w/ice   w/heat  Position: supine  Location: (B) quad with QS      Traction:  Cervical       Lumbar                        Prone          Supine                       Intermittent   Continuous Lbs:   before manual   after manual      Ultrasound: Continuous    Pulsed                           1MHz   3MHz Location:  W/cm2:      Iontophoresis with dexamethasone         Location:  Take home patch    In clinic      Ice       heat    Ice massage Position:  Location:      Vasopneumatic Device Pressure:        lo  med  hi   Temperature:  lo  med  hi    Skin assessment post-treatment:  intact redness- no adverse reaction       redness ??? adverse reaction:     31  min Therapeutic Exercise:   See flow sheet: initiated therex per IE   Rationale: increase ROM and increase strength to improve the patient???s ability to ambulate for prolonged distances with reduced pain     X min Patient Education:  Review HEP from IE; encouraged strict adherence to HEP to improve strength/ROM gains     Other Objective/Functional Measures:    O2 stats: 94% saturation following quad sets in long-sitting position, no change upon 2 minutes pursed-lip breathing, improved to 96% sat, 4 minutes post-QS    Pain Level (0-10 scale) post treatment: 0 at rest    ASSESSMENT/Changes in Function:   Notable abdominal instability  as indicated by trace TA contraction. Patient able to perform 50% bridge without substitutions pain-free. Held most supine therex sec lack of O2 tank. Noted abolishment of (L) 5th phalange numbness following piriformis stretching.    Patient will continue to benefit from skilled PT services to modify and progress therapeutic interventions, address functional mobility deficits, address ROM deficits, address strength deficits, analyze and address soft tissue restrictions, analyze and cue movement patterns, analyze and modify body mechanics/ergonomics and assess and modify postural abnormalities to attain remaining goals.       See Plan of Care    See progress note/recertification    See Discharge Summary         Progress towards goals / Updated goals:  Short Term Goals: To be accomplished in 1 weeks:  1. Pt will be independent and compliant with HEP. -Goal progressing; intermittent HEP compliance (11/23/15   Long Term Goals: To be accomplished in 8-12 treatments:  1. Patient will increase FOTO score to 54/100 for indications of increased functional mobility.   2. Patient will demo increased hip ABD strength B to 4/5 for improved stability with gait   3. Patient will demo 100% bridge to indicate improved core strength with ADLs and prolonged amb. -Goal progressing at 50% bridge (11/23/15)   4. Patient will report increased walking tolerance to 3 blocks for ease with community mobility     PLAN    Upgrade activities as tolerated       Continue plan of care     Update interventions per flow sheet         Discharge due to:_    Other: assess response to treatment    Carin Primrose, PTA 11/23/2015

## 2015-11-25 ENCOUNTER — Inpatient Hospital Stay: Payer: MEDICAID | Primary: Internal Medicine

## 2015-11-30 ENCOUNTER — Inpatient Hospital Stay: Admit: 2015-11-30 | Payer: MEDICAID | Primary: Internal Medicine

## 2015-11-30 NOTE — Progress Notes (Signed)
PT DAILY TREATMENT NOTE 8-14    Patient Name: Susan [name]  Date:11/30/2015  DOB: [birth year]    Patient DOB Verified  Payor: [employer]ICA[employer] / [place]: [place] [employer] / Product Type: Managed Care Medicaid /    In time: 130   Out time: 227  Total Treatment Time (min): 57   Visit #: 3 of 8-12    Treatment Area: Bilateral chronic knee pain [M25.561, M25.562, G89.29]    SUBJECTIVE  Pain Level (0-10 scale): 6 L   Any medication changes, allergies to medications, adverse drug reactions, diagnosis change, or new procedure performed?:  No     Yes (see summary sheet for update)  Subjective functional status/changes:    No changes reported  "The left one keeps popping. It hurts"    OBJECTIVE  Modality rationale: increase muscle contraction/control to improve the patient???s ability to improve VMO control for prolonged ambulation   Min Type Additional Details   10  Estim: Att   Unatt        TENS instruct                  IFC  Premod   NMES                     Other:  w/US   w/ice   w/heat  Position: supine  Location: (B) quad with QS      Traction:  Cervical       Lumbar                        Prone          Supine                       Intermittent   Continuous Lbs:   before manual   after manual      Ultrasound: Continuous    Pulsed                             Location:  W/cm2:      Iontophoresis with dexamethasone         Location:  Take home patch    In clinic      Ice       heat    Ice massage Position:  Location:      Vasopneumatic Device Pressure:        lo  med  hi   Temperature:  lo  med  hi    Skin assessment post-treatment:  intact redness- no adverse reaction       redness ??? adverse reaction:     47 min Therapeutic Exercise:   See flow sheet:    Rationale: increase ROM and increase strength to improve the patient???s ability to ambulate for prolonged distances with reduced pain and increase activity tolerance     X min Patient Education:  Review HEP - added TA and bridge with pictures       Other Objective/Functional Measures:    STG 1 - HEP compliance - 1x per day per patient report     Pain Level (0-10 scale) post treatment: 0 at rest    ASSESSMENT/Changes in Function: Patient required verbal and tactile cueing for TA draw to avoid valsalva.  Patient responded well to inhale while relaxed and exhale during contraction. Cont NMES sec to cont poor  quad recruitment noted with QS.  Improved flexibility indicated by progression to incline board calf stretch. Patient tolerated treatment progression well with moderate verbal encouragement.    Patient will continue to benefit from skilled PT services to modify and progress therapeutic interventions, address functional mobility deficits, address ROM deficits, address strength deficits, analyze and address soft tissue restrictions, analyze and cue movement patterns, analyze and modify body mechanics/ergonomics and assess and modify postural abnormalities to attain remaining goals.       See Plan of Care    See progress note/recertification    See Discharge Summary         Progress towards goals / Updated goals:  Short Term Goals: To be accomplished in 1 weeks:  1. Pt will be independent and compliant with HEP. -goal progressing - patient reports 1x per day compliance  11/30/15   Long Term Goals: To be accomplished in 8-12 treatments:  1. Patient will increase FOTO score to 54/100 for indications of increased functional mobility.   2. Patient will demo increased hip ABD strength B to 4/5 for improved stability with gait   3. Patient will demo 100% bridge to indicate improved core strength with ADLs and prolonged amb. -Goal progressing at 50% bridge (11/23/15)   4. Patient will report increased walking tolerance to 3 blocks for ease with community mobility     PLAN    Upgrade activities as tolerated       Continue plan of care    Update interventions per flow sheet         Discharge due to:_    Other: Add SLR NV     Cecilio Asper, PT 11/30/2015

## 2015-12-02 ENCOUNTER — Inpatient Hospital Stay: Admit: 2015-12-02 | Payer: MEDICAID | Primary: Internal Medicine

## 2015-12-02 DIAGNOSIS — M25561 Pain in right knee: Secondary | ICD-10-CM

## 2015-12-02 NOTE — Progress Notes (Signed)
PT DAILY TREATMENT NOTE 8-14    Patient Name: Susan [name]  Date:12/02/2015  DOB: 24-Apr-1951    Patient DOB Verified  Payor: [employer]OSS MEDICAID / [place]: [place] [employer] / Product Type: Managed Care Medicaid /    In time: 1:16pm      Out time: 2:07pm  Total Treatment Time (min): 51  Visit #: 4 of 8-12    Treatment Area: Bilateral chronic knee pain [M25.561, M25.562, G89.29]    SUBJECTIVE  Pain Level (0-10 scale): 0 in (B) knees  Any medication changes, allergies to medications, adverse drug reactions, diagnosis change, or new procedure performed?:  No     Yes (see summary sheet for update)  Subjective functional status/changes:    No changes reported  "I took a hot shower before coming here"    OBJECTIVE  Modality rationale: increase muscle contraction/control to improve the patient???s ability to improve VMO control for prolonged ambulation   Min Type Additional Details   10  Estim: Att   Unatt        TENS instruct                  IFC  Premod   NMES                     Other:  w/US   w/ice (PD)   w/heat  Position: supine  Location: (L) quad with QS      Traction:  Cervical       Lumbar                        Prone          Supine                       Intermittent   Continuous Lbs:   before manual   after manual      Ultrasound: Continuous    Pulsed                           1MHz   3MHz Location:  W/cm2:      Iontophoresis with dexamethasone         Location:  Take home patch    In clinic      Ice       heat    Ice massage Position:  Location:      Vasopneumatic Device Pressure:        lo  med  hi   Temperature:  lo  med  hi    Skin assessment post-treatment:  intact redness- no adverse reaction       redness ??? adverse reaction:     41 min Therapeutic Exercise:   See flow sheet: added SLR   Rationale: increase ROM and increase strength to improve the patient???s ability to ambulate for prolonged distances with reduced pain and increase activity tolerance     X min Patient Education:  Review HEP      Other Objective/Functional Measures:    (-) extensor lag with SLR   Ambulation tolerance: 10 blocks, from home to grocery store   Core strength: 80% bridge    Pain Level (0-10 scale) post treatment: 0    ASSESSMENT/Changes in Function:   Notable improvement in quality and cadence of gait upon arrival today. Noted inc in piriformis extensibility from first f/u treatment with patient able  to maintain tibia parallel to floor in sitting. Reviewed TA draw and held longer to dissociate versus breathing. Patient r/o significant fatigue since previous treatment, therefore, only added SLR. Strong quad contraction with NMES at 87m with patient instructed to QS during on time for inc quad strength.    Patient will continue to benefit from skilled PT services to modify and progress therapeutic interventions, address functional mobility deficits, address ROM deficits, address strength deficits, analyze and address soft tissue restrictions, analyze and cue movement patterns, analyze and modify body mechanics/ergonomics and assess and modify postural abnormalities to attain remaining goals.       See Plan of Care    See progress note/recertification    See Discharge Summary         Progress towards goals / Updated goals:  Short Term Goals: To be accomplished in 1 weeks:  1. Pt will be independent and compliant with HEP. -goal progressing - patient reports 1x per day compliance  11/30/15   Long Term Goals: To be accomplished in 8-12 treatments:  1. Patient will increase FOTO score to 54/100 for indications of increased functional mobility.   2. Patient will demo increased hip ABD strength B to 4/5 for improved stability with gait   3. Patient will demo 100% bridge to indicate improved core strength with ADLs and prolonged amb. -Goal met at 80% bridge today upon cueing for TA draw and strong glut set (12/02/15)  4. Patient will report increased walking tolerance to 3 blocks for ease  with community mobility. -Goal met; patient reporting amb 10 blocks (12/02/15)    PLAN    Upgrade activities as tolerated       Continue plan of care    Update interventions per flow sheet         Discharge due to:_    Other: add S/L SLR NV; cont to cue for TA draw and assess activation    JCreta Levin PTA 12/02/2015

## 2015-12-07 ENCOUNTER — Encounter: Attending: Internal Medicine | Primary: Internal Medicine

## 2015-12-07 ENCOUNTER — Inpatient Hospital Stay: Admit: 2015-12-07 | Payer: MEDICAID | Primary: Internal Medicine

## 2015-12-07 NOTE — Progress Notes (Signed)
PT DAILY TREATMENT NOTE 8-14    Patient Name: Susan [name]  Date:12/07/2015  DOB: 11/11/1950    Patient DOB Verified  Payor: BLUE CROSS MEDICAID / [place] / Product Type: Managed Care Medicaid /    In time: 200 pm      Out time: 305 pm  Total Treatment Time (min): 28  Visit #: 5 of 8-12    Treatment Area: Bilateral chronic knee pain [M25.561, M25.562, G89.29]    SUBJECTIVE  Pain Level (0-10 scale): 5 in (B) knees  Any medication changes, allergies to medications, adverse drug reactions, diagnosis change, or new procedure performed?:  No     Yes (see summary sheet for update)  Subjective functional status/changes:   - No changes reported  " They are really hurting me today. My L knee has been popping a lot lately"    OBJECTIVE  Modality rationale: increase muscle contraction/control to improve the patient???s ability to improve VMO control for prolonged ambulation   Min Type Additional Details   15  Estim: Att   Unatt        TENS instruct                  IFC  Premod   NMES                     Other:  w/US   w/ice (PD)   w/heat  Position: supine  Location: (L) quad with QS      Traction:  Cervical       Lumbar                        Prone          Supine                       Intermittent   Continuous Lbs:   before manual   after manual      Ultrasound: Continuous    Pulsed                           1MHz   3MHz Location:  W/cm2:      Iontophoresis with dexamethasone         Location:  Take home patch    In clinic      Ice       heat    Ice massage Position:  Location:      Vasopneumatic Device Pressure:        lo  med  hi   Temperature:  lo  med  hi    Skin assessment post-treatment:  intact redness- no adverse reaction       redness ??? adverse reaction:     50 min Therapeutic Exercise:   See flow sheet: added SLR, wall slides, hip ABD in Sl   Rationale: increase ROM and increase strength to improve the patient???s ability to ambulate for prolonged distances with reduced pain and increase  activity tolerance     X min Patient Education:  Review HEP     Other Objective/Functional Measures:        added wall slides today as patient has poor form with mini squats in II bars,  added SAQ with no c/o pain, with SLR L LE noted compared to R.   Ambulation tolerance: 10 blocks, from home to grocery store with frequent  rest breaks and pain following   Core strength: 80% bridge, LTG #3 addressed     Pain Level (0-10 scale) post treatment: 5 throbbing    ASSESSMENT/Changes in Function:   Notable antalgic gait today as  pain levels increased,  Strong quad contraction with NMES at 61m with patient instructed to QS during on time for inc quad strength. CP added to L knee     Patient will continue to benefit from skilled PT services to modify and progress therapeutic interventions, address functional mobility deficits, address ROM deficits, address strength deficits, analyze and address soft tissue restrictions, analyze and cue movement patterns, analyze and modify body mechanics/ergonomics and assess and modify postural abnormalities to attain remaining goals.       See Plan of Care    See progress note/recertification    See Discharge Summary         Progress towards goals / Updated goals:  Short Term Goals: To be accomplished in 1 weeks:  1. Pt will be independent and compliant with HEP. -goal progressing - patient reports 1x per day compliance  11/30/15   Long Term Goals: To be accomplished in 8-12 treatments:  1. Patient will increase FOTO score to 54/100 for indications of increased functional mobility.   2. Patient will demo increased hip ABD strength B to 4/5 for improved stability with gait L Hip 4-/5 required A 12-07-15  3. Patient will demo 100% bridge to indicate improved core strength with ADLs and prolonged amb. -Goal met at 80% bridge today upon cueing for TA draw and strong glut set (12/02/15)  4. Patient will report increased walking tolerance to 3 blocks for ease  with community mobility. -Goal met; patient reporting amb 10 blocks (12/02/15)    PLAN    Upgrade activities as tolerated       Continue plan of care    Update interventions per flow sheet         Discharge due to:_    Other: add S/L SLR NV; cont to cue for TA draw and assess activation    [facility] PT 12/07/2015

## 2015-12-09 ENCOUNTER — Inpatient Hospital Stay: Admit: 2015-12-09 | Payer: MEDICAID | Primary: Internal Medicine

## 2015-12-09 ENCOUNTER — Encounter

## 2015-12-09 NOTE — Progress Notes (Signed)
[facility]   [address] [place] [address]  Phone: [phone] Fax: [phone]  PROGRESS NOTE  Patient Name: Susan [name] DOB: 1950/12/27   Treatment/Medical Diagnosis: Bilateral chronic knee pain [M25.561, M25.562, G89.29]   Referral Source: Ples Specter, MD     Date of Initial Visit: 11/15/15 Attended Visits: 6 Missed Visits: 1     SUMMARY OF TREATMENT  Patient is being treated for B knee pain.  Treatment included progressive therex, NMES and ice.    CURRENT STATUS  Patient progressing well with PT.  Pain levels are intermittent in severity, but overall have improved.  Strength is showing slow but steady progress in 4 week period.  Patient reports 1x per day compliance with HEP.  Other assessment as follows:  Pain at best 0, at worst 10  Subjective % improvement 65-70%  Objective: Core : 80% bridge  Strength (1-5)  ???? Left IE/Curr Right IE / Curr   Hip: flexion 3/ 3+ 4+ /5   ??EXT 3 /4 3/ 4   ??ABD 3+ /3 + 4/ 4+   Knee flexion 4/ 4+ 5   ??ext 4 / 4+ 5   ??  Improvements: walking tolerance: 13 blocks ([address]), improvements in household mobility  Deficits L weakness, multiple stairs, walking on TM   Short Term Goals: To be accomplished in 1 weeks:  1. Pt will be independent and compliant with HEP. -goal progressing - patient reports 1x per day compliance 12/09/15   Long Term Goals: To be accomplished in 8-12 treatments:  1. Patient will increase FOTO score to 54/100 for indications of increased functional mobility goal met - FOTO increased to 54/100   2. Patient will demo increased hip ABD strength B to 4/5 for improved stability with gait goal not met for L 3+/5 but met for R 4+/5  3. Patient will demo 100% bridge to indicate improved core strength with ADLs and prolonged amb. -Goal progressing - 80% bridge  4. Patient will report increased walking tolerance to 3 blocks for ease  with community mobility. -Goal met; patient reporting amb 10+ blocks     New Goals to be achieved in __8-12__  treatments:  1.  Patient will demo L hip flexor strength to 4/5 for ease with transfers    2.  Patient will demo 100% bridge for improved core strength for prolonged amb    3.  Patient will demo 5/5 knee extension strength for ease with stair  Negotiation      G-Codes: NA  RECOMMENDATIONS  Patent would benefit from continuation of skilled PT to address above mentioned deficits and progress to updated goals.  Cont 2x per week for 8-12 sessions as needed.    If you have any questions/comments please contact us directly at [phone].   Thank you for allowing [place] to assist in the care of your patient.  Therapist Signature: Rolene Course, PT Date: 12/09/2015     Time: 2:47 PM   NOTE TO PHYSICIAN:  PLEASE COMPLETE THE ORDERS BELOW AND FAX TO   [facility]: [phone].  If you are unable to process this request in 24 hours please contact our office: [phone].  ___ I have read the above report and request that my patient continue as recommended.   ___ I have read the above report and request that my patient continue therapy with the  following changes/special instructions:_________________________________________________________   ___ I have read the above report and request that my patient be discharged from therapy.     Physician Signature:        Date:       Time:

## 2015-12-09 NOTE — Telephone Encounter (Signed)
Requested Prescriptions     Pending Prescriptions Disp Refills   ??? oxyCODONE-acetaminophen (PERCOCET) 5-325 mg per tablet 90 Tab 0     Sig: Take 1 Tab by mouth every eight (8) hours as needed for Pain. Max Daily Amount: 3 Tabs.

## 2015-12-09 NOTE — Progress Notes (Signed)
PT DAILY TREATMENT NOTE 8-14    Patient Name: Susan [name]  Date:12/09/2015  DOB: 1951-01-15    Patient DOB Verified  Payor: BLUE CROSS MEDICAID / Plan: [place] / Product Type: Managed Care Medicaid /    In time: 150  Out time: 255  Total Treatment Time (min): 68  Visit #: 6 of 8-12    Treatment Area: Bilateral chronic knee pain [M25.561, M25.562, G89.29]    SUBJECTIVE  Pain Level (0-10 scale):0  Any medication changes, allergies to medications, adverse drug reactions, diagnosis change, or new procedure performed?:  No     Yes (see summary sheet for update)  Subjective functional status/changes:   - No changes reported  "I am feeling great today."    OBJECTIVE  Modality rationale: increase muscle contraction/control to improve the patient???s ability to improve VMO control for prolonged ambulation   Min Type Additional Details   H  Estim: Att   Unatt        TENS instruct                  IFC  Premod   NMES                     Other:  w/US   w/ice (PD)   w/heat  Position: supine  Location: (L) quad with QS      Traction:  Cervical       Lumbar                        Prone          Supine                       Intermittent   Continuous Lbs:   before manual   after manual      Ultrasound: Continuous    Pulsed                           1MHz   3MHz Location:  W/cm2:      Iontophoresis with dexamethasone         Location:  Take home patch    In clinic   10   Ice       heat    Ice massage Position: semi reclined  Location: B knee       Vasopneumatic Device Pressure:        lo  med  hi   Temperature:  lo  med  hi    Skin assessment post-treatment:  intact redness- no adverse reaction       redness ??? adverse reaction:     55 min Therapeutic Exercise:   See flow sheet: reassess    Rationale: increase ROM and increase strength to improve the patient???s ability to ambulate for prolonged distances with reduced pain and increase activity tolerance     X min Patient Education:  Review HEP      Other Objective/Functional Measures:     Goals assessed for continuation  Pain at best 0, at worst 10  Subjective % improvement 65-70%  Objective: Core : 80% bridge  Strength (1-5)  ?? Left IE/Curr Right IE / Curr   Hip: flexion 3/ 3+ 4+ /5   ??EXT 3 /4 3/ 4   ??ABD 3+ /3 + 4/ 4+   Knee flexion 4/ 4+ 5   ??ext 4 /  4+ 5     Improvements: walking tolerance: 13 blocks ([address]),  improvements in household mobility  Deficits L weakness, multiple stairs, walking on TM     Pain Level (0-10 scale) post treatment: 0    ASSESSMENT/Changes in Function: Patient tolerated treatment progression well, however had a pop during wall slides causing pain and patient couldn't perform wall slide anymore.  Patient was able to walk normally after a minutes rest.  Held NMES sec to improved quad control   SEE PN      Patient will continue to benefit from skilled PT services to modify and progress therapeutic interventions, address functional mobility deficits, address ROM deficits, address strength deficits, analyze and address soft tissue restrictions, analyze and cue movement patterns, analyze and modify body mechanics/ergonomics and assess and modify postural abnormalities to attain remaining goals.       See Plan of Care    See progress note/recertification - 01/30/69    See Discharge Summary         Progress towards goals / Updated goals:  Short Term Goals: To be accomplished in 1 weeks:  1. Pt will be independent and compliant with HEP. -goal progressing - patient reports 1x per day compliance  12/09/15   Long Term Goals: To be accomplished in 8-12 treatments:  1. Patient will increase FOTO score to 54/100 for indications of increased functional mobility goal met - FOTO increased to 54/100   2. Patient will demo increased hip ABD strength B to 4/5 for improved stability with gait goal not met for L 3+/5 but met for R 4+/5  3. Patient will demo 100% bridge to indicate improved core strength with  ADLs and prolonged amb. -Goal progressing - 80% bridge  4. Patient will report increased walking tolerance to 3 blocks for ease with community mobility. -Goal met; patient reporting amb 10+ blocks     PLAN    Upgrade activities as tolerated       Continue plan of care    Update interventions per flow sheet         Discharge due to:_    Other: See PN    Susan [name], PT 12/09/2015

## 2015-12-09 NOTE — Telephone Encounter (Signed)
Denied:    Requested Prescriptions     Refused Prescriptions Disp Refills   ??? oxyCODONE-acetaminophen (PERCOCET) 5-325 mg per tablet 90 Tab 0     Sig: Take 1 Tab by mouth every eight (8) hours as needed for Pain. Max Daily Amount: 3 Tabs.     Refused By: Marsh DollyMEBRAHTU, Steve Youngberg T     Reason for Refusal: Patient has requested refill too soon     Too early to fill. Due on 12/13/15

## 2015-12-14 ENCOUNTER — Encounter: Payer: MEDICAID | Primary: Internal Medicine

## 2015-12-16 ENCOUNTER — Ambulatory Visit
Admit: 2015-12-16 | Discharge: 2015-12-16 | Payer: PRIVATE HEALTH INSURANCE | Attending: Internal Medicine | Primary: Internal Medicine

## 2015-12-16 ENCOUNTER — Inpatient Hospital Stay: Admit: 2015-12-16 | Payer: MEDICAID | Primary: Internal Medicine

## 2015-12-16 DIAGNOSIS — M25511 Pain in right shoulder: Secondary | ICD-10-CM

## 2015-12-16 MED ORDER — OXYCODONE-ACETAMINOPHEN 5 MG-325 MG TAB
5-325 mg | ORAL_TABLET | Freq: Three times a day (TID) | ORAL | 0 refills | Status: DC | PRN
Start: 2015-12-16 — End: 2016-01-13

## 2015-12-16 NOTE — Progress Notes (Signed)
PT DAILY TREATMENT NOTE 8-14    Patient Name: Susan MonsDenise C Age  Date:12/16/2015  DOB: Dec 09, 1950    Patient DOB Verified  Payor: BLUE CROSS MEDICAID / [place] [employer] / Product Type: Managed Care Medicaid /    In time:1:40  Out time:2:40  Total Treatment Time (min): 60  Visit #: 7 of 8-12    Treatment Area: Bilateral chronic knee pain [M25.561, M25.562, G89.29]    SUBJECTIVE  Pain Level (0-10 scale): 0  Any medication changes, allergies to medications, adverse drug reactions, diagnosis change, or new procedure performed?:  No     Yes (see summary sheet for update)  Subjective functional status/changes:    No changes reported  "My knee's don't hurt right now but they sometimes pop and it is painful and they give out."    OBJECTIVE  Modality rationale: Decrease pain, inflammation, and post Tx soreness   Min Type Additional Details     Estim: Att   Unatt        TENS instruct                  IFC  Premod   NMES                     Other:  w/US   w/ice   w/heat  Position:  Location:      Traction:  Cervical       Lumbar                        Prone          Supine                       Intermittent   Continuous Lbs:   before manual   after manual      Ultrasound: Continuous    Pulsed                           1MHz   3MHz Location:  W/cm2:      Iontophoresis with dexamethasone         Location:  Take home patch    In clinic   10   Ice       heat    Ice massage Position:Semi-reclined  Location: B Knee      Vasopneumatic Device Pressure:        lo  med  hi   Temperature:  lo  med  hi    Skin assessment post-treatment:  intact redness- no adverse reaction    50 min Therapeutic Exercise:   See flow sheet :   Rationale: increase ROM and increase strength to improve the patient???s ability to ambulate for prolonged distances with reduced pain and increase activity tolerance           X min Patient Education:  Review HEP          Other Objective/Functional Measures:    TE per flow sheet; progress PREs as tolerated.  Initiated hip 3 way with t-band and step ups.   TM walking: 5" @ 1.893mph. VCs for increase step length, heel strike, and upright posture.    Pain Level (0-10 scale) post treatment: 0    ASSESSMENT/Changes in Function: Antalgic gait (R knee) noted following upright bike but improved after initial few steps. No adverse effects with hip 3 way t-band but VCs for  upright posture, ecc control, and to avoid truncal lean. Progressed PREs with existing TE with good form and no increase in pain. Fatigue noted by end of session. Primary strength deficits in hip flex/ext/abd L>R.    Patient will continue to benefit from skilled PT services to modify and progress therapeutic interventions, address functional mobility deficits, address ROM deficits, address strength deficits, analyze and address soft tissue restrictions, analyze and cue movement patterns, analyze and modify body mechanics/ergonomics and assess and modify postural abnormalities to attain remaining goals.        See Plan of Care    See progress note/recertification 12-09-15    See Discharge Summary         Progress towards goals / Updated goals:  1. Patient will demo L hip flexor strength to 4/5 for ease with transfers. 3+/5; 12/16/15.  2. Patient will demo 100% bridge for improved core strength for prolonged amb. 80%; 12/16/15.  3. Patient will demo 5/5 knee extension strength for ease with stair negotiation. 4+/5; 12/16/15.    PLAN    Upgrade activities as tolerated       Continue plan of care    Update interventions per flow sheet         Discharge due to:_    Other:_    Melida Gimenez V, PTA 12/16/2015  1:10 PM

## 2015-12-16 NOTE — Progress Notes (Signed)
Chief Complaint   Patient presents with   ??? Shoulder Pain     right shoulder pain f/u   ??? Neck Pain       HPI:     Susan [name] is a 65 y.o. African American female with history of dyslipidemia and hypertension  here for the above complaint.  She is having neck pain that started 2 weeks ago. She was using warm compresses and is having pain that radiates down right arm and numbness and tingling. She never got the MRI of her right shoulder. She denies any chest pain, shortness of breath, abdominal pain, headaches or dizziness.             Past Medical History:   Diagnosis Date   ??? Asthma    ??? Bipolar 1 disorder (HCC)     being followed by Dr. Donnajean LopesHuma Hyder   ??? Chronic obstructive pulmonary disease (HCC)    ??? Dyslipidemia    ??? Hypertension      Past Surgical History:   Procedure Laterality Date   ??? HX HEENT       Current Outpatient Prescriptions   Medication Sig   ??? oxyCODONE-acetaminophen (PERCOCET) 5-325 mg per tablet Take 1 Tab by mouth every eight (8) hours as needed for Pain. Max Daily Amount: 3 Tabs.   ??? venlafaxine-SR (EFFEXOR XR) 75 mg capsule Take  by mouth daily.   ??? lisinopril (PRINIVIL, ZESTRIL) 10 mg tablet Take 1 Tab by mouth daily.   ??? pravastatin (PRAVACHOL) 20 mg tablet Take 1 Tab by mouth daily.   ??? albuterol (PROVENTIL HFA, VENTOLIN HFA, PROAIR HFA) 90 mcg/actuation inhaler Take  by inhalation. Take 1-2 puffs every 4-6 hrs prn shortness of breath   ??? venlafaxine-SR (EFFEXOR-XR) 150 mg capsule Take 150 mg by mouth daily.   ??? OLANZapine (ZYPREXA) 10 mg tablet Take 10 mg by mouth nightly.   ??? albuterol (PROVENTIL VENTOLIN) 2.5 mg /3 mL (0.083 %) nebulizer solution 3 mL by Nebulization route every four (4) hours as needed for Wheezing.     No current facility-administered medications for this visit.      Health Maintenance   Topic Date Due   ??? DTaP/Tdap/Td series (1 - Tdap) 07/18/1973   ??? PAP AKA CERVICAL CYTOLOGY  07/18/1973   ??? BREAST CANCER SCRN MAMMOGRAM  07/18/2002    ??? FOBT Q 1 YEAR AGE 87-75  07/18/2002   ??? ZOSTER VACCINE AGE 78>  07/18/2012   ??? Hepatitis C Screening  Completed   ??? Pneumococcal 19-64 Medium Risk  Completed   ??? INFLUENZA AGE 46 TO ADULT  Addressed     Immunization History   Administered Date(s) Administered   ??? Pneumococcal Polysaccharide (PPSV-23) 07/17/2011, 08/24/2015     No LMP recorded. Patient is postmenopausal.        Allergies and Intolerances:   Allergies   Allergen Reactions   ??? Penicillins Other (comments)     States unknown reaction        Family History:   History reviewed. No pertinent family history.    Social History:   She  reports that she has quit smoking. She has a 25.00 pack-year smoking history. She does not have any smokeless tobacco history on file.  She  reports that she does not drink alcohol.            ??     OBJECTIVE:   Physical exam:   Visit Vitals   ??? BP 140/80 (BP 1 Location:  Left arm, BP Patient Position: Sitting)   ??? Pulse 66   ??? Temp 96.6 ??F (35.9 ??C) (Oral)   ??? Resp 16   ??? Ht  (1.702 m)   ??? Wt 177 lb 9.6 oz (80.6 kg)   ??? SpO2 98%   ??? BMI 27.82 kg/m2        Generally: Pleasant female in no acute distress  Cardiac Exam: regular, rate, and rhythm. Normal S1 and S2. No murmurs, gallops, or rubs  Pulmonary exam: Clear to auscultation bilaterally  Abdominal exam: Positive bowel sounds in all four quadrants, soft, nondistended, nontender  Neck exam: TTP in the back of neck   Right shoulder exam: TTP in right shoulder area, good ROM      LABS/RADIOLOGICAL TESTS:  Lab Results   Component Value Date/Time    WBC 4.2 10/27/2015 12:00 AM    HGB 13.8 10/27/2015 12:00 AM    HCT 41.6 10/27/2015 12:00 AM    PLATELET 227 10/27/2015 12:00 AM     Lab Results   Component Value Date/Time    Sodium 140 10/27/2015 12:00 AM    Potassium 4.4 10/27/2015 12:00 AM    Chloride 97 10/27/2015 12:00 AM    CO2 28 10/27/2015 12:00 AM    Glucose 96 10/27/2015 12:00 AM    BUN 22 10/27/2015 12:00 AM    Creatinine 0.87 10/27/2015 12:00 AM     Lab Results    Component Value Date/Time    Cholesterol, total 245 10/27/2015 12:00 AM    HDL Cholesterol 69 10/27/2015 12:00 AM    LDL, calculated 166 10/27/2015 12:00 AM    Triglyceride 52 10/27/2015 12:00 AM     No results found for: GPT    Previous labs    ASSESSMENT/PLAN:    1. Right shoulder pain, unspecified chronicity  -     MRI CERV SPINE WO CONT; Future  -     REFERRAL TO ORTHOPEDICS    2. Cervicalgia  -     MRI SHOULDER RT WO CONT; Future  -     REFERRAL TO ORTHOPEDICS    3. Chronic pain of both knees  -     oxyCODONE-acetaminophen (PERCOCET) 5-325 mg per tablet; Take 1 Tab by mouth every eight (8) hours as needed for Pain. Max Daily Amount: 3 Tabs.  -     REFERRAL TO ORTHOPEDICS      4.   Requested Prescriptions     Signed Prescriptions Disp Refills   ??? oxyCODONE-acetaminophen (PERCOCET) 5-325 mg per tablet 90 Tab 0     Sig: Take 1 Tab by mouth every eight (8) hours as needed for Pain. Max Daily Amount: 3 Tabs.     5. Patient verbalized understanding and agreement with the plan.    6. Patient was given an after-visit summary.    7.   Follow-up Disposition:  Return in about 4 weeks (around 01/13/2016) for f/u neck pain. or sooner if worsening symptoms.          Marsh Dolly, MD

## 2015-12-16 NOTE — Progress Notes (Signed)
ROOM # 1    Susan [name] presents today for   Chief Complaint   Patient presents with   ??? Shoulder Pain     right shoulder pain f/u   ??? Neck Pain       Susan [name] preferred language for health care discussion is english/other.    Is someone accompanying this pt? no    Is the patient using any DME equipment during OV? no    Depression Screening:  PHQ 2 / 9, over the last two weeks 02/18/2015   Little interest or pleasure in doing things Not at all   Feeling down, depressed or hopeless Not at all   Total Score PHQ 2 0       Learning Assessment:  Learning Assessment 02/18/2015   PRIMARY LEARNER Patient   HIGHEST LEVEL OF EDUCATION - PRIMARY LEARNER  DID NOT GRADUATE HIGH SCHOOL   BARRIERS PRIMARY LEARNER NONE   CO-LEARNER CAREGIVER No   PRIMARY LANGUAGE ENGLISH   INTERPRETER NEED No   LEARNER PREFERENCE PRIMARY DEMONSTRATION   LEARNING SPECIAL TOPICS no   ANSWERED BY patient   RELATIONSHIP SELF   ASSESSMENT COMMENT none       Abuse Screening:  No flowsheet data found.    Fall Risk  No flowsheet data found.    Health Maintenance reviewed and discussed per provider. Yes    Susan [name] is due for pap smear, mammogram, FOBT, pneumonia, DTAP vax.  Please order/place referral if appropriate.      Advance Directive:  1. Do you have an advance directive in place? Patient Reply: no    2. If not, would you like material regarding how to put one in place? Patient Reply: no    Coordination of Care:  1. Have you been to the ER, urgent care clinic since your last visit?  Hospitalized since your last visit? no    2. Have you seen or consulted any other [facility] providers [facility] since your last visit? Include any pap smears or colon screening. no

## 2015-12-16 NOTE — Patient Instructions (Signed)
1) follow-up in 1 month or sooner if worsening symptoms.

## 2015-12-21 ENCOUNTER — Inpatient Hospital Stay: Payer: MEDICAID | Primary: Internal Medicine

## 2015-12-23 ENCOUNTER — Inpatient Hospital Stay: Admit: 2015-12-23 | Payer: MEDICAID | Primary: Internal Medicine

## 2015-12-23 NOTE — Progress Notes (Signed)
PT DAILY TREATMENT NOTE 8-14    Patient Name: Susan [name]  Date:12/23/2015  DOB: 04/02/1951    Patient DOB Verified  Payor: BLUE CROSS MEDICAID / [place] [facility] [place] [facility] / Product Type: Managed Care Medicaid /    In time: 2:29pm  Out time: 3:33pm  Total Treatment Time (min): 54  Visit #: 2 of 8-12    Treatment Area: Bilateral chronic knee pain [M25.561, M25.562, G89.29]    SUBJECTIVE  Pain Level (0-10 scale): 6  Any medication changes, allergies to medications, adverse drug reactions, diagnosis change, or new procedure performed?:  No     Yes (see summary sheet for update)  Subjective functional status/changes:    No changes reported  "I always feel better after I come here"    OBJECTIVE  Modality rationale: Decrease pain, inflammation, and post Tx soreness   Min Type Additional Details     Estim: Att   Unatt        TENS instruct                  IFC  Premod   NMES                     Other:  w/US   w/ice   w/heat  Position:  Location:      Traction:  Cervical       Lumbar                        Prone          Supine                       Intermittent   Continuous Lbs:   before manual   after manual      Ultrasound: Continuous    Pulsed                           1MHz   3MHz Location:  W/cm2:      Iontophoresis with dexamethasone         Location:  Take home patch    In clinic   10   Ice       heat    Ice massage Position:Semi-reclined  Location: B Knee      Vasopneumatic Device Pressure:        lo  med  hi   Temperature:  lo  med  hi    Skin assessment post-treatment:  intact redness- no adverse reaction    54 min Therapeutic Exercise:   See flow sheet: added prone SLR   Rationale: increase ROM and increase strength to improve the patient???s ability to ambulate for prolonged distances with reduced pain and increase activity tolerance           X min Patient Education:  Review HEP          Other Objective/Functional Measures:    (B) knee EXT strength: (L) 5/5, (R) 4+/5     Pain Level (0-10 scale) post treatment: 0    ASSESSMENT/Changes in Function:   Patient has nearly met LTG#3.    Patient will continue to benefit from skilled PT services to modify and progress therapeutic interventions, address functional mobility deficits, address ROM deficits, address strength deficits, analyze and address soft tissue restrictions, analyze and cue movement patterns, analyze and modify body mechanics/ergonomics and assess and modify postural abnormalities  to attain remaining goals.        See Plan of Care    See progress note/recertification (01/01/69)    See Discharge Summary         Progress towards goals / Updated goals:  1. Patient will demo L hip flexor strength to 4/5 for ease with transfers. 3+/5; 12/16/15.  2. Patient will demo 100% bridge for improved core strength for prolonged amb. 80%; 12/16/15.  3. Patient will demo 5/5 knee extension strength for ease with stair negotiation. -Goal progressing; (L) 5/5, (R) 4+/5 (12/23/15)    PLAN    Upgrade activities as tolerated       Continue plan of care    Update interventions per flow sheet         Discharge due to:_    Other:_      Creta Levin, PTA 12/23/2015

## 2015-12-28 ENCOUNTER — Encounter: Payer: MEDICAID | Primary: Internal Medicine

## 2015-12-30 ENCOUNTER — Inpatient Hospital Stay: Payer: MEDICAID | Primary: Internal Medicine

## 2016-01-04 ENCOUNTER — Inpatient Hospital Stay: Admit: 2016-01-04 | Payer: MEDICAID | Primary: Internal Medicine

## 2016-01-04 DIAGNOSIS — M25561 Pain in right knee: Secondary | ICD-10-CM

## 2016-01-04 NOTE — Progress Notes (Signed)
PT DAILY TREATMENT NOTE 8-14    Patient Name: Susan [name]  Date:01/04/2016  DOB: November 12, 1950    Patient DOB Verified  Payor: BLUE CROSS MEDICAID / [place] [facility] [place] [facility] [place] / Product Type: Managed Care Medicaid /    In time: 230 pm  Out time:  355 pm  Total Treatment Time (min): 85  Visit #: 3 of 8-12    Treatment Area: Bilateral chronic knee pain [M25.561, M25.562, G89.29]    SUBJECTIVE  Pain Level (0-10 scale): 6  Any medication changes, allergies to medications, adverse drug reactions, diagnosis change, or new procedure performed?:  No     Yes (see summary sheet for update)  Subjective functional status/changes:    No changes reported  " My neck is bothering me today, My L knee gave out last night. "    OBJECTIVE  Modality rationale: Decrease pain, inflammation, and post Tx soreness   Min Type Additional Details     Estim: Att   Unatt        TENS instruct                  IFC  Premod   NMES                     Other:  w/US   w/ice   w/heat  Position:  Location:      Traction:  Cervical       Lumbar                        Prone          Supine                       Intermittent   Continuous Lbs:   before manual   after manual      Ultrasound: Continuous    Pulsed                           1MHz   3MHz Location:  W/cm2:      Iontophoresis with dexamethasone         Location:  Take home patch    In clinic   10   Ice       heat    Ice massage Position:Semi-reclined  Location: B Knee      Vasopneumatic Device Pressure:        lo  med  hi   Temperature:  lo  med  hi    Skin assessment post-treatment:  intact redness- no adverse reaction    75 min Therapeutic Exercise:   See flow sheet: added prone SLR and step ups   Rationale: increase ROM and increase strength to improve the patient???s ability to ambulate for prolonged distances with reduced pain and increase activity tolerance           X min Patient Education:  Review HEP          Other Objective/Functional Measures:     L hip flexor strength 4-/5  01/04/16               Added fwd step ups today     Pain Level (0-10 scale) post treatment: 5    ASSESSMENT/Changes in Function:   Patient has increased knee pain with wall slides, weakness noted in gluts as she requires assist with prone hip extensions, patient given Glut  sets for HEP to perform often to increase NM connection.     Patient will continue to benefit from skilled PT services to modify and progress therapeutic interventions, address functional mobility deficits, address ROM deficits, address strength deficits, analyze and address soft tissue restrictions, analyze and cue movement patterns, analyze and modify body mechanics/ergonomics and assess and modify postural abnormalities to attain remaining goals.        See Plan of Care    See progress note/recertification (12/09/15)    See Discharge Summary         Progress towards goals / Updated goals:  1. Patient will demo L hip flexor strength to 4/5 for ease with transfers. 3+/5; 12/16/15.  2. Patient will demo 100% bridge for improved core strength for prolonged amb. 80%; 12/16/15.  3. Patient will demo 5/5 knee extension strength for ease with stair negotiation. -Goal progressing; (L) 5/5, (R) 4+/5 (12/23/15)    PLAN    Upgrade activities as tolerated       Continue plan of care    Update interventions per flow sheet         Discharge due to:_    Other:_      Norris Cross, PT 01/04/2016

## 2016-01-06 ENCOUNTER — Inpatient Hospital Stay: Admit: 2016-01-06 | Payer: MEDICAID | Primary: Internal Medicine

## 2016-01-06 NOTE — Progress Notes (Signed)
PT DAILY TREATMENT NOTE 8-14    Patient Name: Susan [name]  Date:01/06/2016  DOB: 08-28-1951    Patient DOB Verified  Payor: BLUE CROSS MEDICAID / Plan: [place] / Product Type: Managed Care Medicaid /    In time: 329  Out time:  340  Total Treatment Time (min): 45  Visit #: 4 of 8-12    Treatment Area: Bilateral chronic knee pain [M25.561, M25.562, G89.29]    SUBJECTIVE  Pain Level (0-10 scale): 6  Any medication changes, allergies to medications, adverse drug reactions, diagnosis change, or new procedure performed?:  No     Yes (see summary sheet for update)  Subjective functional status/changes:    No changes reported  "I almost fell this morning and the left leg popped."    OBJECTIVE  Modality rationale: Decrease pain, inflammation, and post Tx soreness   Min Type Additional Details   10  Estim: [place] 10 off 5 on with QS to VMO   w/US   w/ice   w/heat  Position: long sitting   Location:L VMO       Traction:  Cervical       Lumbar                        Prone          Supine                       Intermittent   Continuous Lbs:   before manual   after manual      Ultrasound: Continuous    Pulsed                           1MHz   3MHz Location:  W/cm2:      Iontophoresis with dexamethasone         Location:  Take home patch    In clinic      Ice       heat    Ice massage Position:Semi-reclined  Location: B Knee      Vasopneumatic Device Pressure:        lo  med  hi   Temperature:  lo  med  hi    Skin assessment post-treatment:  intact redness- no adverse reaction    59 min Therapeutic Exercise:   See flow sheet:  Reassess and progressed PRE - 100% VCing for form with new therex     Rationale: increase ROM and increase strength to improve the patient???s ability to ambulate for prolonged distances with reduced pain and increase activity tolerance           X min Patient Education:  Review HEP          Other Objective/Functional Measures:    Pain at best 0, at worst 6/10   Subjective % improvement 65-70%  Objective: Core :100% bridge   Lateral tracking positive   Strength (1-5)  ???? L Last Assess/ Curr R Last Assess/ Curr   Hip: flexion  3+/ 4-  5/5   ??EXT 4/ 4 4/ 4   ??ABD 3 +/ 4 4+/ 5   Knee flexion  4+/ 5 5/5   ??ext  4+/ 5 5/5   ????  Improvements: walking tolerance: home to public storage on [address] (amb is patient main form of transportation)  Deficits : co LE fatigue and pain after prolonged amb,  hip strength    Pain Level (0-10 scale) post treatment: 2 L     ASSESSMENT/Changes in Function: SEE PN     Patient will continue to benefit from skilled PT services to modify and progress therapeutic interventions, address functional mobility deficits, address ROM deficits, address strength deficits, analyze and address soft tissue restrictions, analyze and cue movement patterns, analyze and modify body mechanics/ergonomics and assess and modify postural abnormalities to attain remaining goals.        See Plan of Care    See progress note/recertification 03/03/69    See Discharge Summary         Progress towards goals / Updated goals:  1. Patient will demo L hip flexor strength to 4/5 for ease with transfers. Goal progressing - increased to 4-/5  2. Patient will demo 100% bridge for improved core strength for prolonged amb. Goal met - increased from 80% to 100%  3. Patient will demo 5/5 knee extension strength for ease with stair negotiation. -Goal met - increased from 4+ to 5    PLAN    Upgrade activities as tolerated       Continue plan of care    Update interventions per flow sheet         Discharge due to:_    Other:_ patient to schedule 2x2 weeks and then assess for continuation     Cont to stress hip and core strengthening and patellar tracking improvement - add SL clam and TB TKE  NV     Rolene Course, PT 01/06/2016

## 2016-01-06 NOTE — Progress Notes (Signed)
[facility]   [address] [place] [address]  Phone: [phone] Fax: [phone]  PROGRESS NOTE  Patient Name: Susan [name] DOB: 1951/01/16   Treatment/Medical Diagnosis: Bilateral chronic knee pain [M25.561, M25.562, G89.29]   Referral Source: Ples Specter, MD     Date of Initial Visit: 11/15/15 Attended Visits: 10 Missed Visits: 4     SUMMARY OF TREATMENT  Patient is being treated for B knee pain.  Treatment included progressive therex, NMES and ice.    CURRENT STATUS  Patient progressing slowly but steadily with PT.  Patient only seen for 4 visits since last PN sec to transportation. Assessment as follows:  Subjective % improvement 65-70%  Objective: Core :100% bridge  Lateral tracking positive   Strength (1-5)  ???? L Last Assess/ Curr R Last Assess/ Curr   Hip: flexion 3+/ 4- 5/5   ??EXT 4/ 4 4/ 4   ??ABD 3 +/ 4 4+/ 5   Knee flexion 4+/ 5 5/5   ??ext 4+/ 5 5/5   ????  Improvements: walking tolerance: home to public storage on [address] (amb is patient main form of transportation)  Deficits : co LE fatigue and pain after prolonged amb, hip strength    Progress towards goals / Updated goals:  1. Patient will demo L hip flexor strength to 4/5 for ease with transfers. Goal progressing - increased to 4-/5  2. Patient will demo 100% bridge for improved core strength for prolonged amb. Goal met - increased from 80% to 100%  3. Patient will demo 5/5 knee extension strength for ease with stair negotiation. -Goal met - increased from 4+ to 5    New Goals to be achieved in __4-8   treatments:  1.  Patient will demo L hip flexion strength to 4/5 for ease with transfers   2.  Patient will report pain 5/10 at worst for increased activity tolerance   3.  Patient will demo B hip extension to 4+/5 for indication of improved core strength     G-Codes: NA  RECOMMENDATIONS  Patent would benefit from continuation of skilled PT to address above  mentioned deficits and progress to updated goals.  Cont  2x per week for 4-8 sessions as needed.    If you have any questions/comments please contact us directly at [phone].   Thank you for allowing [place] to assist in the care of your patient.  Therapist Signature: Rolene Course, PT Date: 01/06/2016     Time: 2:47 PM   NOTE TO PHYSICIAN:  PLEASE COMPLETE THE ORDERS BELOW AND FAX TO   [facility]: [phone].  If you are unable to process this request in 24 hours please contact our office: [phone].  ___ I have read the above report and request that my patient continue as recommended.   ___ I have read the above report and request that my patient continue therapy with the following changes/special instructions:_________________________________________________________   ___ I have read the above report and request that my patient be discharged from therapy.     Physician Signature:        Date:       Time:

## 2016-01-11 ENCOUNTER — Inpatient Hospital Stay: Payer: MEDICAID | Primary: Internal Medicine

## 2016-01-13 ENCOUNTER — Telehealth

## 2016-01-13 ENCOUNTER — Inpatient Hospital Stay: Admit: 2016-01-13 | Payer: MEDICAID | Primary: Internal Medicine

## 2016-01-13 ENCOUNTER — Ambulatory Visit
Admit: 2016-01-13 | Discharge: 2016-01-13 | Payer: PRIVATE HEALTH INSURANCE | Attending: Internal Medicine | Primary: Internal Medicine

## 2016-01-13 DIAGNOSIS — I10 Essential (primary) hypertension: Secondary | ICD-10-CM

## 2016-01-13 LAB — AMB POC DRUG SCREEN (G0477)
AMPHETAMINES UR POC: NEGATIVE
BARBITURATES UR POC: NEGATIVE
BENZODIAZEPINES UR POC: NEGATIVE
CANNABINOIDS UR POC: POSITIVE
COCAINE UR POC: NEGATIVE
MDMA/ECSTASY UR POC: NEGATIVE
METHADONE UR POC: NEGATIVE
METHAMPHETAMINE UR POC: NEGATIVE
OPIATES UR POC: NEGATIVE
OXYCODONE UR POC: POSITIVE

## 2016-01-13 MED ORDER — OXYCODONE-ACETAMINOPHEN 5 MG-325 MG TAB
5-325 mg | ORAL_TABLET | Freq: Three times a day (TID) | ORAL | 0 refills | Status: DC | PRN
Start: 2016-01-13 — End: 2016-02-11

## 2016-01-13 MED ORDER — ALBUTEROL SULFATE HFA 90 MCG/ACTUATION AEROSOL INHALER
90 mcg/actuation | RESPIRATORY_TRACT | 3 refills | Status: DC
Start: 2016-01-13 — End: 2016-01-17

## 2016-01-13 MED ORDER — PRAVASTATIN 20 MG TAB
20 mg | ORAL_TABLET | Freq: Every day | ORAL | 3 refills | Status: DC
Start: 2016-01-13 — End: 2016-10-17

## 2016-01-13 NOTE — Progress Notes (Signed)
ROOM # 2    Susan [name] presents today for   Chief Complaint   Patient presents with   ??? Hypertension   ??? Neck Pain     4 week f/u. imaging to be done tomorrow.    ??? Medication Refill     Requested Prescriptions     Pending Prescriptions Disp Refills   ??? venlafaxine-SR (EFFEXOR XR) 75 mg capsule       Sig: Take  by mouth daily.   ??? venlafaxine-SR (EFFEXOR-XR) 150 mg capsule       Sig: Take 1 Cap by mouth daily.   ??? oxyCODONE-acetaminophen (PERCOCET) 5-325 mg per tablet 90 Tab 0     Sig: Take 1 Tab by mouth every eight (8) hours as needed for Pain. Max Daily Amount: 3 Tabs.   ??? albuterol (PROVENTIL HFA, VENTOLIN HFA, PROAIR HFA) 90 mcg/actuation inhaler 1 Inhaler      Sig: Take  by inhalation. Take 1-2 puffs every 4-6 hrs prn shortness of breath   ??? OLANZapine (ZYPREXA) 10 mg tablet       Sig: Take 1 Tab by mouth nightly.   ??? pravastatin (PRAVACHOL) 20 mg tablet 30 Tab 3     Sig: Take 1 Tab by mouth daily.         Susan [name] preferred language for health care discussion is english/other.    Is someone accompanying this pt? Yes, granddaughter    Is the patient using any DME equipment during OV? no    Depression Screening:  PHQ 2 / 9, over the last two weeks 02/18/2015   Little interest or pleasure in doing things Not at all   Feeling down, depressed or hopeless Not at all   Total Score PHQ 2 0       Learning Assessment:  Learning Assessment 02/18/2015   PRIMARY LEARNER Patient   HIGHEST LEVEL OF EDUCATION - PRIMARY LEARNER  DID NOT GRADUATE HIGH SCHOOL   BARRIERS PRIMARY LEARNER NONE   CO-LEARNER CAREGIVER No   PRIMARY LANGUAGE ENGLISH   INTERPRETER NEED No   LEARNER PREFERENCE PRIMARY DEMONSTRATION   LEARNING SPECIAL TOPICS no   ANSWERED BY patient   RELATIONSHIP SELF   ASSESSMENT COMMENT none       Abuse Screening:  No flowsheet data found.    Fall Risk  No flowsheet data found.    Health Maintenance reviewed and discussed per provider. Yes     Susan [name] is due for DTAP vax, pap smear, mammogram, FOBT, shingles vax.  Please order/place referral if appropriate.      Advance Directive:  1. Do you have an advance directive in place? Patient Reply: no    2. If not, would you like material regarding how to put one in place? Patient Reply: no    Coordination of Care:  1. Have you been to the ER, urgent care clinic since your last visit?  Hospitalized since your last visit? no    2. Have you seen or consulted any other health care providers outside of the [facility] since your last visit? Include any pap smears or colon screening. no

## 2016-01-13 NOTE — Telephone Encounter (Signed)
Darnette with scheduling called please submit peer to peer to patients insurance company for approval of MRI scan please return call to [phone]

## 2016-01-13 NOTE — Progress Notes (Signed)
Chief Complaint   Patient presents with   ??? Hypertension   ??? Neck Pain     4 week f/u. imaging to be done tomorrow.    ??? Medication Refill       HPI:     Susan [name] is a 64 y.o. African American female with history of asthma and dyslipidemia  here for the above complaint.  She denies any chest pain, shortness of breath, abdominal pain, headaches or dizziness. She sees orthopedics tomorrow. She is going to orthopedics tomorrow, but wants to get pain medications here. She will sign a pain agreement and UDS.          Past Medical History:   Diagnosis Date   ??? Asthma    ??? Bipolar 1 disorder (HCC)     being followed by Dr. Donnajean Lopes   ??? Chronic obstructive pulmonary disease (HCC)    ??? Dyslipidemia    ??? Hypertension      Past Surgical History:   Procedure Laterality Date   ??? HX HEENT       Current Outpatient Prescriptions   Medication Sig   ??? oxyCODONE-acetaminophen (PERCOCET) 5-325 mg per tablet Take 1 Tab by mouth every eight (8) hours as needed for Pain. Max Daily Amount: 3 Tabs.   ??? albuterol (PROVENTIL HFA, VENTOLIN HFA, PROAIR HFA) 90 mcg/actuation inhaler Take 1-2 puffs every 4-6 hrs prn shortness of breath   ??? pravastatin (PRAVACHOL) 20 mg tablet Take 1 Tab by mouth daily.   ??? venlafaxine-SR (EFFEXOR XR) 75 mg capsule Take  by mouth daily.   ??? lisinopril (PRINIVIL, ZESTRIL) 10 mg tablet Take 1 Tab by mouth daily.   ??? venlafaxine-SR (EFFEXOR-XR) 150 mg capsule Take 150 mg by mouth daily.   ??? OLANZapine (ZYPREXA) 10 mg tablet Take 10 mg by mouth nightly.   ??? albuterol (PROVENTIL VENTOLIN) 2.5 mg /3 mL (0.083 %) nebulizer solution 3 mL by Nebulization route every four (4) hours as needed for Wheezing.     No current facility-administered medications for this visit.      Health Maintenance   Topic Date Due   ??? DTaP/Tdap/Td series (1 - Tdap) 07/18/1973   ??? PAP AKA CERVICAL CYTOLOGY  07/18/1973   ??? BREAST CANCER SCRN MAMMOGRAM  07/18/2002   ??? FOBT Q 1 YEAR AGE 58-75  07/18/2002    ??? ZOSTER VACCINE AGE 31>  07/18/2012   ??? Hepatitis C Screening  Completed   ??? Pneumococcal 19-64 Medium Risk  Completed   ??? INFLUENZA AGE 47 TO ADULT  Addressed     Immunization History   Administered Date(s) Administered   ??? Pneumococcal Polysaccharide (PPSV-23) 07/17/2011, 08/24/2015     No LMP recorded. Patient is postmenopausal.        Allergies and Intolerances:   Allergies   Allergen Reactions   ??? Penicillins Other (comments)     States unknown reaction        Family History:   History reviewed. No pertinent family history.    Social History:   She  reports that she has quit smoking. She has a 25.00 pack-year smoking history. She does not have any smokeless tobacco history on file.  She  reports that she does not drink alcohol.              ??     OBJECTIVE:   Physical exam:   Visit Vitals   ??? BP 160/80 (BP 1 Location: Left arm, BP Patient Position: Sitting)   ???  Pulse 64   ??? Temp 97.3 ??F (36.3 ??C) (Oral)   ??? Resp 16   ??? Ht 5\' 7"  (1.702 m)   ??? Wt 176 lb 3.2 oz (79.9 kg)   ??? SpO2 95%   ??? BMI 27.6 kg/m2        Generally: Pleasant female in no acute distress  Cardiac Exam: regular, rate, and rhythm. Normal S1 and S2. No murmurs, gallops, or rubs  Pulmonary exam: Clear to auscultation bilaterally  Abdominal exam: Positive bowel sounds in all four quadrants, soft, nondistended, nontender  Extremities: 2+ dorsalis pedis pulses bilaterally. No pedal edema    bilaterally    LABS/RADIOLOGICAL TESTS:  Lab Results   Component Value Date/Time    WBC 4.2 10/27/2015 12:00 AM    HGB 13.8 10/27/2015 12:00 AM    HCT 41.6 10/27/2015 12:00 AM    PLATELET 227 10/27/2015 12:00 AM     Lab Results   Component Value Date/Time    Sodium 140 10/27/2015 12:00 AM    Potassium 4.4 10/27/2015 12:00 AM    Chloride 97 10/27/2015 12:00 AM    CO2 28 10/27/2015 12:00 AM    Glucose 96 10/27/2015 12:00 AM    BUN 22 10/27/2015 12:00 AM    Creatinine 0.87 10/27/2015 12:00 AM     Lab Results   Component Value Date/Time     Cholesterol, total 245 10/27/2015 12:00 AM    HDL Cholesterol 69 10/27/2015 12:00 AM    LDL, calculated 166 10/27/2015 12:00 AM    Triglyceride 52 10/27/2015 12:00 AM     No results found for: GPT    Previous labs  Component      Latest Ref Rng & Units 01/13/2016          10:56 AM   ALCOHOL UR POC          AMPHETAMINES UR POC       Negative   CARISOPRODOL UR POC          COCAINE UR POC       Negative   FENTANYL UR POC          MDMA/ECSTASY UR POC       Negative   METHADONE UR POC       Negative   METHAMPHETAMINE UR POC       Negative   METHYLPHENIDATE UR POC          OPIATES UR POC       Negative   OXYCODONE UR POC       Presumptive Positive   PHENCYCLIDINE UR POC          PROPOXYPHENE UR POC          TRAMADOL UR POC          TRICYCLICS UR POC          BARBITURATES UR POC       Negative   BENZODIAZEPINES UR POC       Negative   CANNABINOIDS UR POC       Presumptive Positive   Pt notified of results at OV today, 01/13/16.       UDS positive for percocet as expected, but positive for marijuana. She said she periodically smokes marijuana. Told her she needs to stop this or cannot refill percocet anymore. Will give her one chance. We will do another UDS in 1 month and if still positive for marijuana, will no longer refill her pain meds. Pt notified of results.  ASSESSMENT/PLAN:    1. Benign hypertension without CHF: not well controlled. Will increase the lisinopril  2 po daily and work on diet and exercise.     2. Dyslipidemia: we will see what the labs show.   -     pravastatin (PRAVACHOL) 20 mg tablet; Take 1 Tab by mouth daily.    3. Chronic pain of both knees  -     oxyCODONE-acetaminophen (PERCOCET) 5-325 mg per tablet; Take 1 Tab by mouth every eight (8) hours as needed for Pain. Max Daily Amount: 3 Tabs.    4. Medication refill  -     albuterol (PROVENTIL HFA, VENTOLIN HFA, PROAIR HFA) 90 mcg/actuation inhaler; Take 1-2 puffs every 4-6 hrs prn shortness of breath   -     pravastatin (PRAVACHOL) 20 mg tablet; Take 1 Tab by mouth daily.    5. Encounter for chronic pain management  -     AMB POC DRUG SCREEN (R6045)      6.   Requested Prescriptions     Signed Prescriptions Disp Refills   ??? oxyCODONE-acetaminophen (PERCOCET) 5-325 mg per tablet 90 Tab 0     Sig: Take 1 Tab by mouth every eight (8) hours as needed for Pain. Max Daily Amount: 3 Tabs.   ??? albuterol (PROVENTIL HFA, VENTOLIN HFA, PROAIR HFA) 90 mcg/actuation inhaler 3 Inhaler 3     Sig: Take 1-2 puffs every 4-6 hrs prn shortness of breath   ??? pravastatin (PRAVACHOL) 20 mg tablet 90 Tab 3     Sig: Take 1 Tab by mouth daily.     7. Patient verbalized understanding and agreement with the plan.    8. Patient was given an after-visit summary.    9. Follow-up Disposition:  Return in about 2 weeks (around 01/27/2016) for f/u HTN. or sooner if worsening symptoms.          Marsh Dolly, MD

## 2016-01-13 NOTE — Patient Instructions (Signed)
1) increase the lisinopril 10mg  2 po daily     2) follow-up in 2 weeks or sooner if worsening symptoms.     3) keep an eye on your blood pressure and let us know if too high or too low.

## 2016-01-13 NOTE — Progress Notes (Signed)
Pt notified of results at OV today, 01/13/16.

## 2016-01-13 NOTE — Progress Notes (Signed)
PT DAILY TREATMENT NOTE 8-14    Patient Name: Susan [name]  Date:01/13/2016  DOB: 04-03-1951    Patient DOB Verified  Payor: BLUE CROSS MEDICAID / [place] [employer] [place] / Product Type: Managed Care Medicaid /    In time: 11:02am      Out time: 12:00pm  Total Treatment Time (min): 58  Visit #: 1 of 4-8    Treatment Area: Bilateral chronic knee pain [M25.561, M25.562, G89.29]    SUBJECTIVE  Pain Level (0-10 scale): 3.5 in (L) knee  Any medication changes, allergies to medications, adverse drug reactions, diagnosis change, or new procedure performed?:  No     Yes (see summary sheet for update)  Subjective functional status/changes:    No changes reported  "My knees just keep popping."    OBJECTIVE  Modality rationale: Decrease pain, inflammation, and post Tx soreness   Min Type Additional Details   Unable sec clothing  Estim: Russian 10 off 5 on with QS to VMO   w/US   w/ice   w/heat  Position: long sitting   Location:L VMO       Traction:  Cervical       Lumbar                        Prone          Supine                       Intermittent   Continuous Lbs:   before manual   after manual      Ultrasound: Continuous    Pulsed                           1MHz   3MHz Location:  W/cm2:      Iontophoresis with dexamethasone         Location:  Take home patch    In clinic   10   Ice       heat    Ice massage Position: semi-reclined  Location: (B) knee      Vasopneumatic Device Pressure:        lo  med  hi   Temperature:  lo  med  hi    Skin assessment post-treatment:  intact redness- no adverse reaction    48 min Therapeutic Exercise:   See flow sheet: added clam I and standing GTB TKE   Rationale: increase ROM and increase strength to improve the patient???s ability to ambulate for prolonged distances with reduced pain and increase activity tolerance           X min Patient Education:  Review HEP          Other Objective/Functional Measures:     Pain Level (0-10 scale) post treatment: 1-2     ASSESSMENT/Changes in Function:   Good tolerance to therex today with patient able to complete all tasks without adverse reaction. Significant glut MAX strength deficits as indicated by quick onset of fatigue with bridging, however, improved core control with SLR.    Patient will continue to benefit from skilled PT services to modify and progress therapeutic interventions, address functional mobility deficits, address ROM deficits, address strength deficits, analyze and address soft tissue restrictions, analyze and cue movement patterns, analyze and modify body mechanics/ergonomics and assess and modify postural abnormalities to attain remaining goals.  See Plan of Care    See progress note/recertification (01/06/16)    See Discharge Summary         Progress towards goals / Updated goals:  1. Patient will demo L hip flexion strength to 4/5 for ease with transfers   2. Patient will report pain 5/10 at worst for increased activity tolerance   3. Patient will demo B hip extension to 4+/5 for indication of improved core strength     PLAN    Upgrade activities as tolerated       Continue plan of care    Update interventions per flow sheet         Discharge due to:_    Other:_    Carin Primrose, PTA 01/13/2016

## 2016-01-14 ENCOUNTER — Ambulatory Visit: Payer: MEDICAID | Primary: Internal Medicine

## 2016-01-14 LAB — METABOLIC PANEL, COMPREHENSIVE
A-G Ratio: 1.3 (ref 1.2–2.2)
ALT (SGPT): 16 IU/L (ref 0–32)
AST (SGOT): 19 IU/L (ref 0–40)
Albumin: 4.6 g/dL (ref 3.6–4.8)
Alk. phosphatase: 97 IU/L (ref 39–117)
BUN/Creatinine ratio: 19 (ref 12–28)
BUN: 19 mg/dL (ref 8–27)
Bilirubin, total: <0.2 mg/dL (ref 0.0–1.2)
CO2: 26 mmol/L (ref 18–29)
Calcium: 9.6 mg/dL (ref 8.7–10.3)
Chloride: 96 mmol/L (ref 96–106)
Creatinine: 1 mg/dL (ref 0.57–1.00)
GFR est AA: 69 mL/min/{1.73_m2} (ref >59)
GFR est non-AA: 60 mL/min/{1.73_m2} (ref >59)
GLOBULIN, TOTAL: 3.5 g/dL (ref 1.5–4.5)
Glucose: 105 mg/dL — ABNORMAL HIGH (ref 65–99)
Potassium: 4.1 mmol/L (ref 3.5–5.2)
Protein, total: 8.1 g/dL (ref 6.0–8.5)
Sodium: 140 mmol/L (ref 134–144)

## 2016-01-14 LAB — LIPID PANEL
Cholesterol, total: 252 mg/dL — ABNORMAL HIGH (ref 100–199)
HDL Cholesterol: 65 mg/dL (ref >39)
LDL, calculated: 169 mg/dL — ABNORMAL HIGH (ref 0–99)
Triglyceride: 91 mg/dL (ref 0–149)
VLDL, calculated: 18 mg/dL (ref 5–40)

## 2016-01-14 LAB — CVD REPORT

## 2016-01-14 NOTE — Progress Notes (Signed)
Please let pt know that labs were normal except:    1) glucose up at 105. Work on diet and exercise.     2) lipids are high. Is pt taking her pravastatin 20mg  everyday? If so, then will need to increase it to 40mg  daily. If not, she needs to take them. Work on diet and exercise.

## 2016-01-14 NOTE — Telephone Encounter (Signed)
Incoming call from Darnette with [employer] [facility] [place] central scheduling, calling to report patient insurance company is requesting a peer to peer , please call number listed below.

## 2016-01-15 NOTE — Telephone Encounter (Signed)
-----   Message from Rosalio LoudSemret T Mebrahtu, MD sent at 01/14/2016  6:34 PM EDT -----  Please let pt know that labs were normal except:    1) glucose up at 105. Work on diet and exercise.     2) lipids are high. Is pt taking her pravastatin 20mg  everyday? If so, then will need to increase it to 40mg  daily. If not, she needs to take them. Work on diet and exercise.

## 2016-01-15 NOTE — Telephone Encounter (Signed)
Attempted to contact pt at hm number, no answer. Lvm for pt to return call to office at [phone]. Will continue to try to contact pt.

## 2016-01-17 ENCOUNTER — Encounter

## 2016-01-17 MED ORDER — ALBUTEROL SULFATE HFA 90 MCG/ACTUATION AEROSOL INHALER
90 mcg/actuation | RESPIRATORY_TRACT | 3 refills | Status: AC
Start: 2016-01-17 — End: ?

## 2016-01-17 NOTE — Telephone Encounter (Signed)
Requested Prescriptions     Pending Prescriptions Disp Refills   ??? albuterol (PROVENTIL HFA, VENTOLIN HFA, PROAIR HFA) 90 mcg/actuation inhaler 3 Inhaler 3     Sig: Take 1-2 puffs every 4-6 hrs prn shortness of breath

## 2016-01-17 NOTE — Telephone Encounter (Signed)
Pt contacted at home number.  2 pt identifiers confirmed.  Pt informed of below.  Pt reports she has not been taking her Pravastatin as ordered because she ran out.  Pt informed that her refill was sent to pharm on 01/13/2016.  Pt verbalized and reports she will pick up medication.  No other questions or concerns at this time.

## 2016-01-18 ENCOUNTER — Inpatient Hospital Stay: Admit: 2016-01-18 | Payer: MEDICAID | Primary: Internal Medicine

## 2016-01-18 NOTE — Progress Notes (Signed)
PT DAILY TREATMENT NOTE 8-14    Patient Name: Susan [name]  Date:01/18/2016  DOB: 02-21-1951    Patient DOB Verified  Payor: [employer]OSS MEDICAID / Plan: [place] [employer] / Product Type: Managed Care Medicaid /    In time: 232 pm      Out time: 334 pm  Total Treatment Time (min): 62  Visit #: 2 of 4-8    Treatment Area: Bilateral chronic knee pain [M25.561, M25.562, G89.29]    SUBJECTIVE  Pain Level (0-10 scale): 5.5  in (L) knee  Any medication changes, allergies to medications, adverse drug reactions, diagnosis change, or new procedure performed?:  No     Yes (see summary sheet for update)  Subjective functional status/changes:    No changes reported  "the other day I was walking to the nursing home and I could barely feel my foot at times, It was numb/tingling. "    OBJECTIVE  Modality rationale: Decrease pain, inflammation, and post Tx soreness   Min Type Additional Details   Unable sec clothing  Estim: Russian 10 off 5 on with QS to VMO   w/US   w/ice   w/heat  Position: long sitting   Location:L VMO       Traction:  Cervical       Lumbar                        Prone          Supine                       Intermittent   Continuous Lbs:   before manual   after manual      Ultrasound: Continuous    Pulsed                             Location:  W/cm2:      Iontophoresis with dexamethasone         Location:  Take home patch    In clinic   10   Ice       heat    Ice massage Position: semi-reclined  Location: (B) knee      Vasopneumatic Device Pressure:        lo  med  hi   Temperature:  lo  med  hi    Skin assessment post-treatment:  intact redness- no adverse reaction    52 min Therapeutic Exercise:   See flow sheet: added clam I and standing GTB TKE   Rationale: increase ROM and increase strength to improve the patient???s ability to ambulate for prolonged distances with reduced pain and increase activity tolerance           X min Patient Education:  Review HEP           Other Objective/Functional Measures:  increased fatigue at end of session today, slight pain in superior patella with TKE exercises     Pain Level (0-10 scale) post treatment: 2    ASSESSMENT/Changes in Function:   Continues with therex fatigue following treatment, c/o tingling with 3 way hip in R LE and at various times when walking per patient report.    Patient will continue to benefit from skilled PT services to modify and progress therapeutic interventions, address functional mobility deficits, address ROM deficits, address strength deficits, analyze and address soft tissue restrictions, analyze  and cue movement patterns, analyze and modify body mechanics/ergonomics and assess and modify postural abnormalities to attain remaining goals.        See Plan of Care    See progress note/recertification (01/06/16)    See Discharge Summary         Progress towards goals / Updated goals:  1. Patient will demo L hip flexion strength to 4/5 for ease with transfers   2. Patient will report pain 5/10 at worst for increased activity tolerance   3. Patient will demo B hip extension to 4+/5 for indication of improved core strength     PLAN    Upgrade activities as tolerated       Continue plan of care    Update interventions per flow sheet         Discharge due to:_    Other:_    Norris CrossNicole D Ajani Rineer, PT 01/18/2016

## 2016-01-20 ENCOUNTER — Encounter: Payer: MEDICAID | Primary: Internal Medicine

## 2016-01-25 ENCOUNTER — Inpatient Hospital Stay: Admit: 2016-01-25 | Payer: MEDICAID | Primary: Internal Medicine

## 2016-01-25 NOTE — Progress Notes (Signed)
PT DAILY TREATMENT NOTE 8-14    Patient Name: Susan [name]  Date:01/25/2016  DOB: 03-Jul-1951    Patient DOB Verified  Payor: BLUE CROSS MEDICAID / Plan: [place] [employer] / Product Type: Managed Care Medicaid /    In time: 2:21pm      Out time: 3:30pm  Total Treatment Time (min): 69  Visit #: 3 of 4-8    Treatment Area: Bilateral chronic knee pain [M25.561, M25.562, G89.29]    SUBJECTIVE  Pain Level (0-10 scale): 0 with medication  Any medication changes, allergies to medications, adverse drug reactions, diagnosis change, or new procedure performed?:  No     Yes (see summary sheet for update)  Subjective functional status/changes:    No changes reported  "It's the weather. My knee acts up when its raining."    OBJECTIVE  Modality rationale: Decrease pain, inflammation, and post Tx soreness   Min Type Additional Details   Unable sec clothing  Estim: Russian 10 off 5 on with QS to VMO   w/US   w/ice   w/heat  Position: long sitting   Location:L VMO       Traction:  Cervical       Lumbar                        Prone          Supine                       Intermittent   Continuous Lbs:   before manual   after manual      Ultrasound: Continuous    Pulsed                             Location:  W/cm2:      Iontophoresis with dexamethasone         Location:  Take home patch    In clinic   10   Ice to (B) knees       Heat to C/S    Ice massage Position: semi-reclined  Location: see type      Vasopneumatic Device Pressure:        lo  med  hi   Temperature:  lo  med  hi    Skin assessment post-treatment:  intact redness- no adverse reaction    59 min Therapeutic Exercise:   See flow sheet: added YTB monster walks forward and lateral; TC standing hip 3-way and progressed therex on reps or resistance   Rationale: increase ROM and increase strength to improve the patient???s ability to ambulate for prolonged distances with reduced pain and increase activity tolerance            X min Patient Education:  Review HEP          Other Objective/Functional Measures:     Stair negotiation: reciprocal pattern but with intermittent UE assist during descent    Pain Level (0-10 scale) post treatment: 0    ASSESSMENT/Changes in Function:   Good tolerance to treatment today. (-) extensor lag with SLR, but cont core strength deficits as indicated by poor PPT with bridges.    Patient will continue to benefit from skilled PT services to modify and progress therapeutic interventions, address functional mobility deficits, address ROM deficits, address strength deficits, analyze and address soft tissue restrictions, analyze and cue movement patterns, analyze  and modify body mechanics/ergonomics and assess and modify postural abnormalities to attain remaining goals.        See Plan of Care    See progress note/recertification (01/06/16)    See Discharge Summary         Progress towards goals / Updated goals:  1. Patient will demo L hip flexion strength to 4/5 for ease with transfers  -Goal progressing; SLR without extensor lag (01/25/16)  2. Patient will report pain 5/10 at worst for increased activity tolerance. -Goal progressing; managed at ~5-6/10 pain (01/25/16)  3. Patient will demo B hip extension to 4+/5 for indication of improved core strength -Goal progressing; 75% bridge today (01/25/16)    PLAN    Upgrade activities as tolerated       Continue plan of care    Update interventions per flow sheet         Discharge due to:_    Other:_    Carin PrimroseJonathan M Abrea Henle, PTA 01/25/2016

## 2016-01-26 NOTE — Telephone Encounter (Signed)
Patient called to check on status of MRI peer to peer.

## 2016-01-26 NOTE — Telephone Encounter (Addendum)
Called [facility] to get peer to peer and talked to Dr. Carson Myrtleeuban. He said he cannot approve the c-spine and right shoulder MRI's. Requires x-rays and for the neck: 6 weeks of treatment which includes physical therapy. For the right shoulder: 4 weeks treatment which includes physical therapy and medical treatment.         Called pt about above and ordered c-spine and right shoulder x-rays and another referral to physical therapy for neck and shoulder. She said she has appt. With PT tomorrow for her knees, so she will ask if they can do neck and shoulder.   Pt verbalized understanding.

## 2016-01-26 NOTE — Telephone Encounter (Signed)
Erroneous encouter

## 2016-01-27 ENCOUNTER — Encounter: Admit: 2016-01-27 | Primary: Internal Medicine

## 2016-01-27 ENCOUNTER — Ambulatory Visit
Admit: 2016-01-27 | Discharge: 2016-01-27 | Payer: PRIVATE HEALTH INSURANCE | Attending: Internal Medicine | Primary: Internal Medicine

## 2016-01-27 DIAGNOSIS — M542 Cervicalgia: Secondary | ICD-10-CM

## 2016-01-27 DIAGNOSIS — I10 Essential (primary) hypertension: Secondary | ICD-10-CM

## 2016-01-27 DIAGNOSIS — M25511 Pain in right shoulder: Secondary | ICD-10-CM

## 2016-01-27 MED ORDER — ALBUTEROL SULFATE 0.083 % (0.83 MG/ML) SOLN FOR INHALATION
2.5 mg /3 mL (0.083 %) | RESPIRATORY_TRACT | 0 refills | Status: AC | PRN
Start: 2016-01-27 — End: ?

## 2016-01-27 NOTE — Patient Instructions (Signed)
1) take lisinopril 10mg  2 po daily. Monitor BP and let us know if too high or too low      2 follow-up in 1 month or sooner if worsening symptoms.

## 2016-01-27 NOTE — Progress Notes (Signed)
Chief Complaint   Patient presents with   ??? Hypertension       HPI:     Susan [name] is a 65 y.o. African American female with history of asthma, bipolar, hypertension, COPD  here for the above complaint. She denies any chest pain, shortness of breath, abdominal pain, headaches or dizziness. She had not been taking lisinopril 67m 2 po daily, but one daily. Today she took 2 daily. She is having neck pain. She will get x-rays of her neck and shoulder and physical therapy.             Past Medical History:   Diagnosis Date   ??? Asthma    ??? Bipolar 1 disorder ([facility]     being followed by Dr. HArnell Sieving  ??? Chronic obstructive pulmonary disease ([facility]    ??? Dyslipidemia    ??? Hypertension      Past Surgical History:   Procedure Laterality Date   ??? HX HEENT       Current Outpatient Prescriptions   Medication Sig   ??? albuterol (PROVENTIL VENTOLIN) 2.5 mg /3 mL (0.083 %) nebulizer solution 3 mL by Nebulization route every four (4) hours as needed for Wheezing.   ??? albuterol (PROVENTIL HFA, VENTOLIN HFA, PROAIR HFA) 90 mcg/actuation inhaler Take 1-2 puffs every 4-6 hrs prn shortness of breath   ??? oxyCODONE-acetaminophen (PERCOCET) 5-325 mg per tablet Take 1 Tab by mouth every eight (8) hours as needed for Pain. Max Daily Amount: 3 Tabs.   ??? pravastatin (PRAVACHOL) 20 mg tablet Take 1 Tab by mouth daily.   ??? venlafaxine-SR (EFFEXOR XR) 75 mg capsule Take  by mouth daily.   ??? lisinopril (PRINIVIL, ZESTRIL) 10 mg tablet Take 1 Tab by mouth daily.   ??? venlafaxine-SR (EFFEXOR-XR) 150 mg capsule Take 150 mg by mouth daily.   ??? OLANZapine (ZYPREXA) 10 mg tablet Take 10 mg by mouth nightly.     No current facility-administered medications for this visit.      Health Maintenance   Topic Date Due   ??? DTaP/Tdap/Td series (1 - Tdap) 07/18/1973   ??? PAP AKA CERVICAL CYTOLOGY  07/18/1973   ??? BREAST CANCER SCRN MAMMOGRAM  07/18/2002   ??? FOBT Q 1 YEAR AGE 53-75  07/18/2002   ??? ZOSTER VACCINE AGE 67>  07/18/2012    ??? Hepatitis C Screening  Completed   ??? Pneumococcal 19-64 Medium Risk  Completed   ??? INFLUENZA AGE 73 TO ADULT  Addressed     Immunization History   Administered Date(s) Administered   ??? Pneumococcal Polysaccharide (PPSV-23) 07/17/2011, 08/24/2015     No LMP recorded. Patient is postmenopausal.        Allergies and Intolerances:   Allergies   Allergen Reactions   ??? Penicillins Other (comments)     States unknown reaction        Family History:   History reviewed. No pertinent family history.    Social History:   She  reports that she has quit smoking. She has a 25.00 pack-year smoking history. She does not have any smokeless tobacco history on file.  She  reports that she does not drink alcohol.          ??     OBJECTIVE:   Physical exam:   Visit Vitals   ??? BP 140/88 (BP 1 Location: Left arm, BP Patient Position: Sitting)   ??? Pulse 63   ??? Temp 96.4 ??F (35.8 ??C) (Oral)   ???  Resp 16   ??? Ht 5' 7"  (1.702 m)   ??? Wt 176 lb (79.8 kg)   ??? SpO2 93%   ??? BMI 27.57 kg/m2        Generally: Pleasant female in no acute distress  Cardiac Exam: regular, rate, and rhythm. Normal S1 and S2. No murmurs, gallops, or rubs  Pulmonary exam: Clear to auscultation bilaterally  Abdominal exam: Positive bowel sounds in all four quadrants, soft, nondistended, nontender  Extremities: 2+ dorsalis pedis pulses bilaterally. No pedal edema    bilaterally    LABS/RADIOLOGICAL TESTS:  Component      Latest Ref Rng & Units 01/13/2016 01/13/2016 01/13/2016          10:56 AM 10:47 AM 10:47 AM   Glucose      65 - 99 mg/dL      BUN      8 - 27 mg/dL      Creatinine      0.57 - 1.00 mg/dL      GFR est non-AA      >59 mL/min/1.73      GFR est AA      >59 mL/min/1.73      BUN/Creatinine ratio      12 - 28      Sodium      134 - 144 mmol/L      Potassium      3.5 - 5.2 mmol/L      Chloride      96 - 106 mmol/L      CO2      18 - 29 mmol/L      Calcium      8.7 - 10.3 mg/dL      Protein, total      6.0 - 8.5 g/dL      Albumin      3.6 - 4.8 g/dL      GLOBULIN, TOTAL       1.5 - 4.5 g/dL      A-G Ratio      1.2 - 2.2      Bilirubin, total      0.0 - 1.2 mg/dL      Alk. phosphatase      39 - 117 IU/L      AST      0 - 40 IU/L      ALT      0 - 32 IU/L      ALCOHOL UR POC            AMPHETAMINES UR POC       Negative     CARISOPRODOL UR POC            COCAINE UR POC       Negative     FENTANYL UR POC            MDMA/ECSTASY UR POC       Negative     METHADONE UR POC       Negative     METHAMPHETAMINE UR POC       Negative     METHYLPHENIDATE UR POC            OPIATES UR POC       Negative     OXYCODONE UR POC       Presumptive Positive     PHENCYCLIDINE UR POC            PROPOXYPHENE UR POC  TRAMADOL UR POC            TRICYCLICS UR POC            BARBITURATES UR POC       Negative     BENZODIAZEPINES UR POC       Negative     CANNABINOIDS UR POC       Presumptive Positive     WBC      3.4 - 10.8 x10E3/uL      RBC      3.77 - 5.28 x10E6/uL      HGB      11.1 - 15.9 g/dL      HCT      34.0 - 46.6 %      MCV      79 - 97 fL      MCH      26.6 - 33.0 pg      MCHC      31.5 - 35.7 g/dL      RDW      12.3 - 15.4 %      PLATELET      150 - 379 x10E3/uL      Cholesterol, total      100 - 199 mg/dL   252 (H)   Triglyceride      0 - 149 mg/dL   91   HDL Cholesterol      >39 mg/dL   65   VLDL, calculated      5 - 40 mg/dL   18   LDL, calculated      0 - 99 mg/dL   169 (H)   Hep C Virus Ab      0.0 - 0.9 s/co ratio      VITAMIN D, 25-HYDROXY      30.0 - 100.0 ng/mL      INTERPRETATION        Note      Component      Latest Ref Rng & Units 01/13/2016 10/27/2015 10/27/2015 10/27/2015          10:47 AM 12:00 AM 12:00 AM 12:00 AM   Glucose      65 - 99 mg/dL 105 (H)      BUN      8 - 27 mg/dL 19      Creatinine      0.57 - 1.00 mg/dL 1.00      GFR est non-AA      >59 mL/min/1.73 60      GFR est AA      >59 mL/min/1.73 69      BUN/Creatinine ratio      12 - 28 19      Sodium      134 - 144 mmol/L 140      Potassium      3.5 - 5.2 mmol/L 4.1      Chloride      96 - 106 mmol/L 96      CO2       18 - 29 mmol/L 26      Calcium      8.7 - 10.3 mg/dL 9.6      Protein, total      6.0 - 8.5 g/dL 8.1      Albumin      3.6 - 4.8 g/dL 4.6      GLOBULIN, TOTAL      1.5 - 4.5 g/dL 3.5      A-G Ratio  1.2 - 2.2 1.3      Bilirubin, total      0.0 - 1.2 mg/dL <0.2      Alk. phosphatase      39 - 117 IU/L 97      AST      0 - 40 IU/L 19      ALT      0 - 32 IU/L 16      ALCOHOL UR POC             AMPHETAMINES UR POC             CARISOPRODOL UR POC             COCAINE UR POC             FENTANYL UR POC             MDMA/ECSTASY UR POC             METHADONE UR POC             METHAMPHETAMINE UR POC             METHYLPHENIDATE UR POC             OPIATES UR POC             OXYCODONE UR POC             PHENCYCLIDINE UR POC             PROPOXYPHENE UR POC             TRAMADOL UR POC             TRICYCLICS UR POC             BARBITURATES UR POC             BENZODIAZEPINES UR POC             CANNABINOIDS UR POC             WBC      3.4 - 10.8 x10E3/uL       RBC      3.77 - 5.28 x10E6/uL       HGB      11.1 - 15.9 g/dL       HCT      34.0 - 46.6 %       MCV      79 - 97 fL       MCH      26.6 - 33.0 pg       MCHC      31.5 - 35.7 g/dL       RDW      12.3 - 15.4 %       PLATELET      150 - 379 x10E3/uL       Cholesterol, total      100 - 199 mg/dL       Triglyceride      0 - 149 mg/dL       HDL Cholesterol      >39 mg/dL       VLDL, calculated      5 - 40 mg/dL       LDL, calculated      0 - 99 mg/dL       Hep C Virus Ab      0.0 - 0.9 s/co ratio    <0.1   VITAMIN D, 25-HYDROXY      30.0 -  100.0 ng/mL   16.6 (L)    INTERPRETATION        Note       Component      Latest Ref Rng & Units 10/27/2015 10/27/2015 10/27/2015          12:00 AM 12:00 AM 12:00 AM   Glucose      65 - 99 mg/dL   96   BUN      8 - 27 mg/dL   22   Creatinine      0.57 - 1.00 mg/dL   0.87   GFR est non-AA      >59 mL/min/1.73   71   GFR est AA      >59 mL/min/1.73   82   BUN/Creatinine ratio      12 - 28   25   Sodium      134 - 144 mmol/L   140   Potassium       3.5 - 5.2 mmol/L   4.4   Chloride      96 - 106 mmol/L   97   CO2      18 - 29 mmol/L   28   Calcium      8.7 - 10.3 mg/dL   9.8   Protein, total      6.0 - 8.5 g/dL   7.3   Albumin      3.6 - 4.8 g/dL   4.4   GLOBULIN, TOTAL      1.5 - 4.5 g/dL   2.9   A-G Ratio      1.2 - 2.2   1.5   Bilirubin, total      0.0 - 1.2 mg/dL   0.3   Alk. phosphatase      39 - 117 IU/L   77   AST      0 - 40 IU/L   19   ALT      0 - 32 IU/L   16   ALCOHOL UR POC            AMPHETAMINES UR POC            CARISOPRODOL UR POC            COCAINE UR POC            FENTANYL UR POC            MDMA/ECSTASY UR POC            METHADONE UR POC            METHAMPHETAMINE UR POC            METHYLPHENIDATE UR POC            OPIATES UR POC            OXYCODONE UR POC            PHENCYCLIDINE UR POC            PROPOXYPHENE UR POC            TRAMADOL UR POC            TRICYCLICS UR POC            BARBITURATES UR POC            BENZODIAZEPINES UR POC            CANNABINOIDS UR POC            WBC  3.4 - 10.8 x10E3/uL  4.2    RBC      3.77 - 5.28 x10E6/uL  4.32    HGB      11.1 - 15.9 g/dL  13.8    HCT      34.0 - 46.6 %  41.6    MCV      79 - 97 fL  96    MCH      26.6 - 33.0 pg  31.9    MCHC      31.5 - 35.7 g/dL  33.2    RDW      12.3 - 15.4 %  14.2    PLATELET      150 - 379 x10E3/uL  227    Cholesterol, total      100 - 199 mg/dL 245 (H)     Triglyceride      0 - 149 mg/dL 52     HDL Cholesterol      >39 mg/dL 69     VLDL, calculated      5 - 40 mg/dL 10     LDL, calculated      0 - 99 mg/dL 166 (H)     Hep C Virus Ab      0.0 - 0.9 s/co ratio      VITAMIN D, 25-HYDROXY      30.0 - 100.0 ng/mL      INTERPRETATION              All lab results and radiological studies were reviewed and discussed with the patient.    ASSESSMENT/PLAN:    1. Benign hypertension without CHF: improving, but could be better. Continue the lisinopril 44m 2 po daily and work on diet and exercise. Monitor BP and let [place] if too high or too low.      2. Cervicalgia: she will get c-spine x-ray and PT    3. Chronic obstructive pulmonary disease, unspecified COPD type ([place]: medication refill  -     albuterol (PROVENTIL VENTOLIN) 2.5 mg /3 mL (0.083 %) nebulizer solution; 3 mL by Nebulization route every four (4) hours as needed for Wheezing.    4.   Requested Prescriptions     Signed Prescriptions Disp Refills   ??? albuterol (PROVENTIL VENTOLIN) 2.5 mg /3 mL (0.083 %) nebulizer solution 30 Each 0     Sig: 3 mL by Nebulization route every four (4) hours as needed for Wheezing.     5. Patient verbalized understanding and agreement with the plan.    6. Patient was given an after-visit summary.    7.   Follow-up Disposition:  Return in about 1 month (around 02/26/2016) for f/u HTN. or sooner if worsening symptoms.          SLewie Loron MD

## 2016-01-27 NOTE — Progress Notes (Signed)
Susan [name] presents today for   Chief Complaint   Patient presents with   ??? Hypertension       Susan Monsenise C [name] preferred language for health care discussion is english/other.    Is someone accompanying this pt? No    Is the patient using any DME equipment during OV? no    Depression Screening:  PHQ 2 / 9, over the last two weeks 02/18/2015   Little interest or pleasure in doing things Not at all   Feeling down, depressed or hopeless Not at all   Total Score PHQ 2 0       Learning Assessment:  Learning Assessment 02/18/2015   PRIMARY LEARNER Patient   HIGHEST LEVEL OF EDUCATION - PRIMARY LEARNER  DID NOT GRADUATE HIGH SCHOOL   BARRIERS PRIMARY LEARNER NONE   CO-LEARNER CAREGIVER No   PRIMARY LANGUAGE ENGLISH   INTERPRETER NEED No   LEARNER PREFERENCE PRIMARY DEMONSTRATION   LEARNING SPECIAL TOPICS no   ANSWERED BY patient   RELATIONSHIP SELF   ASSESSMENT COMMENT none       Abuse Screening:  Not due.     Fall Risk  Not Due.    Health Maintenance reviewed and discussed per provider. Yes    Susan [name] is due for mammo, colonoscopy, pap and tdap.  Please order/place referral if appropriate.      Advance Directive:  1. Do you have an advance directive in place? Patient Reply: no    2. If not, would you like material regarding how to put one in place? Patient Reply: No    Coordination of Care:  1. Have you been to the ER, urgent care clinic since your last visit?  Hospitalized since your last visit? No    2. Have you seen or consulted any other health care providers outside of the [facility] since your last visit? No

## 2016-01-28 ENCOUNTER — Encounter: Payer: MEDICAID | Primary: Internal Medicine

## 2016-01-28 ENCOUNTER — Encounter

## 2016-01-28 NOTE — Telephone Encounter (Signed)
Attempted to contact pt at hm number, number not in service.  Will continue to try to contact pt.

## 2016-01-28 NOTE — Telephone Encounter (Signed)
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     3) Has she heard from orthopedics? Will do another referral for both neck and shoulder? If she has not heard from them within 1 week, to give us a call.

## 2016-01-28 NOTE — Progress Notes (Signed)
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     3) Has she heard from orthopedics? Will do another referral for both neck and shoulder? If she has not heard from them within 1 week, to give us a call.

## 2016-01-28 NOTE — Progress Notes (Signed)
Result letter generated on 01/28/16 and will be mailed to pt.

## 2016-01-29 NOTE — Telephone Encounter (Signed)
Unsuccessful attempt to reach pt for  Results. Unable to leave message as this phone number not in service

## 2016-01-31 NOTE — Telephone Encounter (Signed)
Attempted to contact pt at hm number, no answer. Number not in service.  I have been unable to reach this patient by phone.  A letter is being sent to the last known home address.  Encounter will closed.

## 2016-02-03 ENCOUNTER — Inpatient Hospital Stay: Admit: 2016-02-03 | Payer: MEDICAID | Primary: Internal Medicine

## 2016-02-03 DIAGNOSIS — M542 Cervicalgia: Secondary | ICD-10-CM

## 2016-02-03 NOTE — Progress Notes (Signed)
PT DAILY TREATMENT NOTE 8-14    Patient Name: Susan [name]  Date:02/03/2016  DOB: 1951/07/07    Patient DOB Verified  Payor: BLUE CROSS MEDICAID / [place] [employer] / Product Type: Managed Care Medicaid /    In time: 10:19am        Out time: 10:42am  Total Treatment Time (min): 23  Visit #: 4 of 4-8    Treatment Area: Bilateral chronic knee pain [M25.561, M25.562, G89.29]    SUBJECTIVE  Pain Level (0-10 scale): 6 in (L) knee only  Any medication changes, allergies to medications, adverse drug reactions, diagnosis change, or new procedure performed?:  No     Yes (see summary sheet for update)  Subjective functional status/changes:    No changes reported  "I'm not really trying to do a lot today because my neck has been hurting. I slept wrong one day and it hasn't felt right since."    OBJECTIVE  23 min Therapeutic Exercise:   See flow sheet: assessed goals for D/C; assisted patient with FOTO completion; discussed form/technique with all therex   Rationale: increase ROM and increase strength to improve the patient???s ability to ambulate for prolonged distances with reduced pain and increase activity tolerance           X min Patient Education:  Review HEP          Other Objective/Functional Measures:     Subjective improvement of 95% in symptoms since IE reported.   Improvements: amb tolerance to 6 blocks, ave pain levels, sit<>stand transfers, resolution of (R) knee symptoms   FOTO score: 64/100 (at last assessment, 54/100)   Core strength: 100% bridge   (L) knee pain: (A) 3-4, (B) 0, (W) 5-6   Strength (1-5)  ???? L Last Assess/ Curr R Last Assess/ Curr   Hip flex 4- / 4+ 5 / 5   Hip ext 4 / 5 4 / 5   Hip??abd 4 / 4+ 5 / 5   Knee flex 5 / 5 5 / 5   Knee ext 5 / 5 5 / 5      Patient agreeable to D/C sec improvements in PT.     Pain Level (0-10 scale) post treatment: 0    ASSESSMENT/Changes in Function:   See D/C.    Patient will continue to benefit from skilled PT services to modify and  progress therapeutic interventions, address functional mobility deficits, address ROM deficits, address strength deficits, analyze and address soft tissue restrictions, analyze and cue movement patterns, analyze and modify body mechanics/ergonomics and assess and modify postural abnormalities to attain remaining goals.        See Discharge Summary         Progress towards goals / Updated goals:  1. Patient will demo L hip flexion strength to 4/5 for ease with transfers  -Goal met; 4+/5  2. Patient will report pain 5/10 at worst for increased activity tolerance. -Goal met; 5-6/10 worst pain  3. Patient will demo B hip extension to 4+/5 for indication of improved core strength -Goal met; 5/5 hip EXT    PLAN        Discharge due to: patient meeting all goals    Creta Levin, PTA 02/03/2016

## 2016-02-03 NOTE — Progress Notes (Addendum)
[facility]   [address] [place] [address]     Phone: [phone] Fax: [phone]  DISCHARGE SUMMARY  Patient Name: Susan [name] DOB: 02-Aug-1951   Treatment/Medical Diagnosis: Bilateral chronic knee pain [M25.561, M25.562, G89.29]   Referral Source: Ples Specter, MD     Date of Initial Visit: 11/15/15 Attended Visits: 14 Missed Visits: 7     SUMMARY OF TREATMENT  Patient is being treated for B knee pain. Treatment has included progressive therex, NMES, and ice.     CURRENT STATUS  Patient has made good progress in PT, reporting resolution of (R) knee symptoms recently. Subjective improvement of 95% in symptoms since IE reported. Assessment as follows:  Improvements: amb tolerance to 6 blocks, ave pain levels, sit<>stand transfers, hip strength  FOTO score: 64/100 (at last assessment, 54/100)  Core strength: 100% bridge  (L) knee pain: (A) 3-4, (B) 0, (W) 5-6    Strength (1-5)  ???? L Last Assess/ Curr R Last Assess/ Curr   Hip flex 4- / 4+ 5 / 5   Hip ext 4 / 5 4 / 5   Hip??abd 4 / 4+ 5 / 5   Knee flex 5 / 5 5 / 5   Knee ext 5 / 5 5 / 5     Goal/Measure of Progress:  1. Patient will demo L hip flexion strength to 4/5 for ease with transfers  -Goal met; 4+/5  2. Patient will report pain 5/10 at worst for increased activity tolerance. -Goal met; 5-6/10 worst pain  3. Patient will demo B hip extension to 4+/5 for indication of improved core strength -Goal met; 5/5 hip EXT    Home exercise program established on initial evaluation and progressed as patient is able to address deficits. Patient now has all of the knowledge to continue with progress per HEP.    RECOMMENDATIONS  Discontinue therapy. Progressing towards or have reached established goals.    If you have any questions/comments please contact us directly at [phone].   Thank you for allowing [place] to assist in the care of your patient.     LPTA Signature: Creta Levin, [place] Date: 02/03/16   Therapist Signature: Darrick Meigs DPT  Time: 4:50 PM

## 2016-02-07 ENCOUNTER — Encounter

## 2016-02-07 NOTE — Telephone Encounter (Signed)
Requested Prescriptions     Pending Prescriptions Disp Refills   ??? oxyCODONE-acetaminophen (PERCOCET) 5-325 mg per tablet 90 Tab 0     Sig: Take 1 Tab by mouth every eight (8) hours as needed for Pain. Max Daily Amount: 3 Tabs.

## 2016-02-08 NOTE — Telephone Encounter (Signed)
Denied :    Refill  Too early to fill. Due on 02/12/16. Will refill on 02/12/16 as will be here that day.       Requested Prescriptions     Refused Prescriptions Disp Refills   ??? oxyCODONE-acetaminophen (PERCOCET) 5-325 mg per tablet 90 Tab 0     Sig: Take 1 Tab by mouth every eight (8) hours as needed for Pain. Max Daily Amount: 3 Tabs.     Refused By: Marsh DollyMEBRAHTU, Seanmichael Salmons T     Reason for Refusal: Patient has requested refill too soon

## 2016-02-10 ENCOUNTER — Inpatient Hospital Stay: Payer: MEDICAID | Primary: Internal Medicine

## 2016-02-11 ENCOUNTER — Encounter

## 2016-02-11 NOTE — Telephone Encounter (Signed)
Requested Prescriptions     Pending Prescriptions Disp Refills   ??? oxyCODONE-acetaminophen (PERCOCET) 5-325 mg per tablet 90 Tab 0     Sig: Take 1 Tab by mouth every eight (8) hours as needed for Pain. Max Daily Amount: 3 Tabs.

## 2016-02-12 NOTE — Telephone Encounter (Signed)
Checked PMP and pt received on:    02/09/16 norco #10 for 2 days Dr. Rueben BashMichael Graham, orthopedics  02/03/16 vailum 5mg  #12 x 3 days Dr. Sherrilyn Risthase Sargent  02/03/16 tramadol 50mg  #20 x 5 days Dr. Sherrilyn Risthase Sargent    Who is Dr. Luz BrazenSargent?     If she is seeing orthopedics, will not refill pain meds?

## 2016-02-14 NOTE — Telephone Encounter (Signed)
Susan [name], [facility]    [facility] [address]  [place], [place] [id number]

## 2016-02-15 MED ORDER — OXYCODONE-ACETAMINOPHEN 5 MG-325 MG TAB
5-325 mg | ORAL_TABLET | Freq: Three times a day (TID) | ORAL | 0 refills | Status: DC | PRN
Start: 2016-02-15 — End: 2016-03-13

## 2016-02-15 NOTE — Telephone Encounter (Signed)
Printed rx for:    Requested Prescriptions     Signed Prescriptions Disp Refills   ??? oxyCODONE-acetaminophen (PERCOCET) 5-325 mg per tablet 90 Tab 0     Sig: Take 1 Tab by mouth every eight (8) hours as needed for Pain. Max Daily Amount: 3 Tabs.     Authorizing Provider: Ordell Prichett T     Please let pt know that this is ready for pick up.

## 2016-02-21 ENCOUNTER — Encounter

## 2016-02-21 MED ORDER — LISINOPRIL 20 MG TAB
20 mg | ORAL_TABLET | Freq: Every day | ORAL | 3 refills | Status: DC
Start: 2016-02-21 — End: 2016-02-29

## 2016-02-21 NOTE — Telephone Encounter (Signed)
Requested Prescriptions     Pending Prescriptions Disp Refills   ??? lisinopril (PRINIVIL, ZESTRIL) 10 mg tablet 90 Tab 3     Sig: Take 1 Tab by mouth daily.

## 2016-02-21 NOTE — Telephone Encounter (Signed)
[facility] Pharmacist called stating the patient's dosage was increased so they need a new Rx with the correct increased dosage so the Ins will cover the meds.  Patient is out of medication.

## 2016-02-21 NOTE — Telephone Encounter (Signed)
Sent electronically :    Requested Prescriptions     Signed Prescriptions Disp Refills   ??? lisinopril (PRINIVIL, ZESTRIL) 20 mg tablet 90 Tab 3     Sig: Take 1 Tab by mouth daily.     Authorizing Provider: Marsh DollyMEBRAHTU, Xxavier Noon T     This way she does not need to take lisinopril 10mg  2 daily.  Let pt know.

## 2016-02-29 MED ORDER — LISINOPRIL 20 MG TAB
20 mg | ORAL_TABLET | Freq: Every day | ORAL | 3 refills | Status: DC
Start: 2016-02-29 — End: 2016-03-28

## 2016-03-01 ENCOUNTER — Encounter: Attending: Internal Medicine | Primary: Internal Medicine

## 2016-03-02 ENCOUNTER — Encounter: Attending: Internal Medicine | Primary: Internal Medicine

## 2016-03-13 ENCOUNTER — Encounter

## 2016-03-13 NOTE — Telephone Encounter (Signed)
Patient request, last OV 01/27/16, no future appt scheduled    Requested Prescriptions     Pending Prescriptions Disp Refills   ??? oxyCODONE-acetaminophen (PERCOCET) 5-325 mg per tablet 90 Tab 0     Sig: Take 1 Tab by mouth every eight (8) hours as needed for Pain. Max Daily Amount: 3 Tabs.

## 2016-03-14 MED ORDER — OXYCODONE-ACETAMINOPHEN 5 MG-325 MG TAB
5-325 mg | ORAL_TABLET | Freq: Three times a day (TID) | ORAL | 0 refills | Status: DC | PRN
Start: 2016-03-14 — End: 2016-04-14

## 2016-03-14 NOTE — Telephone Encounter (Signed)
Printed rx for   Requested Prescriptions     Signed Prescriptions Disp Refills   ??? oxyCODONE-acetaminophen (PERCOCET) 5-325 mg per tablet 90 Tab 0     Sig: Take 1 Tab by mouth every eight (8) hours as needed for Pain. Max Daily Amount: 3 Tabs.     Authorizing Provider: Rosalio LoudMEBRAHTU, Stephanos Fan T           Checked PMP and no aberrancies seen. Last UDS was 01/13/16. Pain agreement signed on 01/13/16.    Please let pt know that this is ready for pick up.

## 2016-03-28 ENCOUNTER — Encounter

## 2016-03-28 MED ORDER — LISINOPRIL 20 MG TAB
20 mg | ORAL_TABLET | Freq: Every day | ORAL | 3 refills | Status: DC
Start: 2016-03-28 — End: 2016-05-19

## 2016-03-28 NOTE — Telephone Encounter (Signed)
Requested Prescriptions     Pending Prescriptions Disp Refills   ??? lisinopril (PRINIVIL, ZESTRIL) 20 mg tablet 90 Tab 3     Sig: Take 1 Tab by mouth daily.   patient stated that she was told by you Dr. Gwenith DailyMebrahtu to take medication 2xs a day instead of 1xs a day

## 2016-03-28 NOTE — Telephone Encounter (Signed)
She is suppose to be taking lisinopril 20mg  one po daily. I told her to do lisinopril 10mg  2 po daily. She is taking too much BP med. She needs a f/u with me to check on her blood pressure.     Requested Prescriptions     Signed Prescriptions Disp Refills   ??? lisinopril (PRINIVIL, ZESTRIL) 20 mg tablet 90 Tab 3     Sig: Take 1 Tab by mouth daily.     Authorizing Provider: Rosalio LoudMEBRAHTU, Merilyn Pagan T

## 2016-04-14 ENCOUNTER — Encounter

## 2016-04-14 MED ORDER — OXYCODONE-ACETAMINOPHEN 5 MG-325 MG TAB
5-325 mg | ORAL_TABLET | Freq: Three times a day (TID) | ORAL | 0 refills | Status: DC | PRN
Start: 2016-04-14 — End: 2016-10-17

## 2016-04-14 NOTE — Telephone Encounter (Signed)
Patient calling for refill on Percocet

## 2016-04-14 NOTE — Telephone Encounter (Signed)
PMP printed and placed in PCP medication refill review folder.     Last UDS date: 01/13/2016  Pain contract signed: 01/13/2016  Next Appt: no future appt scheduled

## 2016-04-14 NOTE — Telephone Encounter (Signed)
Pt has been getting monthly refills on percocet from Dr. Gwenith DailyMebrahtu. Last fill in June  Her PMP also shows in May a few tramadol, [facility] and a few norco from ortho.  Mostly same pharmacy but last percocet was filled at a different pharmacy  Will refill this time in coverage for Dr. Gwenith DailyMebrahtu

## 2016-04-14 NOTE — Telephone Encounter (Signed)
Called spoke with patient verified with two identifiers advised her RX for her percocet was filled and ready for pick up and that she had a couple appointments one she cancelled and on she was a no show that she does need to follow up with Dr. Gwenith DailyMebrahtu so when she picks up her RX to make that appointment.,  Patient verbalized understanding.

## 2016-04-17 NOTE — Telephone Encounter (Signed)
Received documentation for Prior Auth for OXYCODONE-ACETAMINOPHEN    Document scanned to media.

## 2016-04-17 NOTE — Telephone Encounter (Signed)
Patient called in stating a PA is requires for her Percocet Rx.  States she spoke with the pharmacy to to have them fax the PA request and was told they do not do that.

## 2016-04-18 NOTE — Telephone Encounter (Signed)
Form faxed to insurance confirmation received as ok

## 2016-04-18 NOTE — Telephone Encounter (Signed)
Prior auth in work, form completed and waiting to be signed by provider once signed form will be faxed to insurance.

## 2016-04-19 NOTE — Telephone Encounter (Signed)
Prior auth approved from 04/18/16-07/17/16 will call pharmacy and inform of approval.

## 2016-04-19 NOTE — Telephone Encounter (Signed)
Called pharmacy and informed of approval they stated understanding and that med is ready for pick up.

## 2016-04-19 NOTE — Telephone Encounter (Signed)
Called pt 2 pt identifiers confirmed informed pt that prescription is ready for pick up also warned pt of using more than one pharmacy as prescription was filled at different pharmacy than the one on file pt stated understanding no other questions or concerns noted at this time.

## 2016-05-03 ENCOUNTER — Encounter: Attending: Internal Medicine | Primary: Internal Medicine

## 2016-05-11 ENCOUNTER — Encounter: Attending: Internal Medicine | Primary: Internal Medicine

## 2016-05-19 ENCOUNTER — Encounter

## 2016-05-19 MED ORDER — LISINOPRIL 20 MG TAB
20 mg | ORAL_TABLET | Freq: Every day | ORAL | 3 refills | Status: DC
Start: 2016-05-19 — End: 2016-05-22

## 2016-05-19 NOTE — Telephone Encounter (Signed)
Requested Prescriptions     Pending Prescriptions Disp Refills   ??? lisinopril (PRINIVIL, ZESTRIL) 20 mg tablet 90 Tab 3     Sig: Take 1 Tab by mouth daily.

## 2016-05-22 ENCOUNTER — Encounter

## 2016-05-22 MED ORDER — LISINOPRIL 20 MG TAB
20 mg | ORAL_TABLET | Freq: Every day | ORAL | 3 refills | Status: DC
Start: 2016-05-22 — End: 2016-10-17

## 2016-05-22 NOTE — Telephone Encounter (Signed)
Patient reports that she is taking medication twice per day as instructed by provider.  Patient needs refill for quantity 60.    Requested Prescriptions     Pending Prescriptions Disp Refills   ??? lisinopril (PRINIVIL, ZESTRIL) 20 mg tablet 90 Tab 3     Sig: Take 1 Tab by mouth daily.

## 2016-05-31 ENCOUNTER — Encounter: Attending: Internal Medicine | Primary: Internal Medicine

## 2016-06-07 ENCOUNTER — Ambulatory Visit
Admit: 2016-06-07 | Discharge: 2016-06-07 | Payer: PRIVATE HEALTH INSURANCE | Attending: Internal Medicine | Primary: Internal Medicine

## 2016-06-07 DIAGNOSIS — G4733 Obstructive sleep apnea (adult) (pediatric): Secondary | ICD-10-CM

## 2016-06-07 NOTE — Addendum Note (Signed)
Addended by: Monica Becton on: 06/07/2016 04:16 PM      Modules accepted: Orders

## 2016-06-07 NOTE — Progress Notes (Signed)
Chief Complaint   Patient presents with   ??? Hypertension   ??? Pain (Chronic)   ??? Cholesterol Problem       HPI:     Susan [name] is a 65 y.o. African American female with history of hypertension, dyslipidemia, and astham  here for the above complaint.  She denies any chest pain, shortness of breath, abdominal pain, dizziness. She had headache couple of days ago and she checked her BP and it was normal. She may thinks this maybe due to not eating.     Fatigue: it comes and goes for 1 month. She snores at night. She stops breathing in the middle of night. She has daytime sleepiness. She said in the past she was diagnosed with OSA, but never had a machine.             Past Medical History:   Diagnosis Date   ??? Asthma    ??? Bipolar 1 disorder (HCC)     being followed by Dr. Donnajean Lopes   ??? Chronic obstructive pulmonary disease (HCC)    ??? Dyslipidemia    ??? Hypertension    ??? OSA (obstructive sleep apnea)      Past Surgical History:   Procedure Laterality Date   ??? HX HEENT       Current Outpatient Prescriptions   Medication Sig   ??? lisinopril (PRINIVIL, ZESTRIL) 20 mg tablet Take 1 Tab by mouth daily.   ??? oxyCODONE-acetaminophen (PERCOCET) 5-325 mg per tablet Take 1 Tab by mouth every eight (8) hours as needed for Pain. Max Daily Amount: 3 Tabs.   ??? albuterol (PROVENTIL VENTOLIN) 2.5 mg /3 mL (0.083 %) nebulizer solution 3 mL by Nebulization route every four (4) hours as needed for Wheezing.   ??? albuterol (PROVENTIL HFA, VENTOLIN HFA, PROAIR HFA) 90 mcg/actuation inhaler Take 1-2 puffs every 4-6 hrs prn shortness of breath   ??? pravastatin (PRAVACHOL) 20 mg tablet Take 1 Tab by mouth daily.   ??? venlafaxine-SR (EFFEXOR XR) 75 mg capsule Take  by mouth daily.   ??? venlafaxine-SR (EFFEXOR-XR) 150 mg capsule Take 150 mg by mouth daily.   ??? OLANZapine (ZYPREXA) 10 mg tablet Take 10 mg by mouth nightly.     No current facility-administered medications for this visit.      Health Maintenance   Topic Date Due    ??? BREAST CANCER SCRN MAMMOGRAM  01/19/2017 (Originally 07/18/2002)   ??? FOBT Q 1 YEAR AGE 49-75  02/10/2017 (Originally 07/18/2002)   ??? PAP AKA CERVICAL CYTOLOGY  02/24/2017 (Originally 07/18/1973)   ??? DTaP/Tdap/Td series (2 - Td) 06/07/2026   ??? Hepatitis C Screening  Completed   ??? ZOSTER VACCINE AGE 47>  Addressed   ??? Pneumococcal 19-64 Medium Risk  Completed   ??? INFLUENZA AGE 51 TO ADULT  Addressed     Immunization History   Administered Date(s) Administered   ??? Pneumococcal Polysaccharide (PPSV-23) 07/17/2011, 08/24/2015     No LMP recorded. Patient is postmenopausal.        Allergies and Intolerances:   Allergies   Allergen Reactions   ??? Penicillins Other (comments)     States unknown reaction        Family History:   No family history on file.    Social History:   She  reports that she has quit smoking. She has a 25.00 pack-year smoking history. She does not have any smokeless tobacco history on file.  She  reports that she does not  drink alcohol.          ??     OBJECTIVE:   Physical exam:   Visit Vitals   ??? BP 142/80 (BP 1 Location: Left arm, BP Patient Position: Sitting)   ??? Pulse 70   ??? Temp 97.5 ??F (36.4 ??C) (Oral)   ??? Resp 16   ??? Ht 5\' 7"  (1.702 m)   ??? Wt 179 lb 9.6 oz (81.5 kg)   ??? SpO2 96%  Comment: on room air   ??? BMI 28.13 kg/m2        Generally: Pleasant female in no acute distress  Cardiac Exam: regular, rate, and rhythm. Normal S1 and S2. No murmurs, gallops, or rubs  Pulmonary exam: Clear to auscultation bilaterally  Abdominal exam: Positive bowel sounds in all four quadrants, soft, nondistended, nontender  Extremities: 2+ dorsalis pedis pulses bilaterally. No pedal edema    bilaterally    LABS/RADIOLOGICAL TESTS:  Lab Results   Component Value Date/Time    WBC 4.2 10/27/2015 12:00 AM    HGB 13.8 10/27/2015 12:00 AM    HCT 41.6 10/27/2015 12:00 AM    PLATELET 227 10/27/2015 12:00 AM     Lab Results   Component Value Date/Time    Sodium 140 01/13/2016 10:47 AM    Potassium 4.1 01/13/2016 10:47 AM     Chloride 96 01/13/2016 10:47 AM    CO2 26 01/13/2016 10:47 AM    Glucose 105 01/13/2016 10:47 AM    BUN 19 01/13/2016 10:47 AM    Creatinine 1.00 01/13/2016 10:47 AM     Lab Results   Component Value Date/Time    Cholesterol, total 252 01/13/2016 10:47 AM    HDL Cholesterol 65 01/13/2016 10:47 AM    LDL, calculated 169 01/13/2016 10:47 AM    Triglyceride 91 01/13/2016 10:47 AM     No results found for: GPT  Previous labs    ASSESSMENT/PLAN:    1. OSA (obstructive sleep apnea)  -     REFERRAL TO SLEEP STUDIES    2. Fatigue, unspecified type  -     TSH 3RD GENERATION; Future  -     CBC W/O DIFF; Future  -     VITAMIN B12; Future    3. Dyslipidemia: we will see what the labs show. Continue diet and exercise and pravastatin.   -     METABOLIC PANEL, COMPREHENSIVE; Future  -     LIPID PANEL; Future    4. Benign hypertension without CHF: somewhat stable. She was talking when getting lab work . Will monitor. For now continue the lisinopril, diet and exercise.   5. Vitamin D deficiency  -     VITAMIN D, 25 HYDROXY; Future    6. Encounter for chronic pain management: told pt cannot give her pain medication until we get results of UDS.   -     14-DRUG SCREEN, UR; Future    7. Breast cancer screening  -     MAM MAMMO BI SCREENING INCL CAD; Future    8. Cervical cancer screening  -     REFERRAL TO OBSTETRICS AND GYNECOLOGY    9. Colon cancer screening  -     REFERRAL TO GASTROENTEROLOGY      10. Patient verbalized understanding and agreement with the plan.    11. Patient was given an after-visit summary.    12.  Follow-up Disposition:  Return in about 3 months (around 09/06/2016) for f/u HTN/lipids. or sooner if  worsening symptoms.          Lewie Loron, MD

## 2016-06-07 NOTE — Progress Notes (Signed)
Chief Complaint   Patient presents with   ??? Hypertension   ??? Pain (Chronic)   ??? Cholesterol Problem       Pt preferred language for health care discussion is English.    Is someone accompanying this pt? no    Is the patient using any DME equipment during OV? no    Depression Screening:  PHQ over the last two weeks 06/07/2016 02/18/2015   Little interest or pleasure in doing things Not at all Not at all   Feeling down, depressed or hopeless Not at all Not at all   Total Score PHQ 2 0 0       Learning Assessment:  Learning Assessment 02/18/2015   PRIMARY LEARNER Patient   HIGHEST LEVEL OF EDUCATION - PRIMARY LEARNER  DID NOT GRADUATE HIGH SCHOOL   BARRIERS PRIMARY LEARNER NONE   CO-LEARNER CAREGIVER No   PRIMARY LANGUAGE ENGLISH   INTERPRETER NEED No   LEARNER PREFERENCE PRIMARY DEMONSTRATION   LEARNING SPECIAL TOPICS no   ANSWERED BY patient   RELATIONSHIP SELF   ASSESSMENT COMMENT none       Abuse Screening:  Abuse Screening Questionnaire 06/07/2016   Do you ever feel afraid of your partner? N   Are you in a relationship with someone who physically or mentally threatens you? N   Is it safe for you to go home? Y         Health Maintenance reviewed and discussed per provider. Yes, up to date today    Pt currently taking Antiplatelet therapy? no      Advance Directive:  1. Do you have an advance directive in place? Patient Reply:no    2. If not, would you like material regarding how to put one in place? Patient Reply: no    Coordination of Care:  1. Have you been to the ER, urgent care clinic since your last visit? Hospitalized since your last visit? no    2. Have you seen or consulted any other health care providers outside of the [facility] since your last visit? Include any pap smears or colon screening. no

## 2016-06-07 NOTE — Patient Instructions (Signed)
1) follow-up in 3 months or sooner if worsening symptoms.

## 2016-06-13 LAB — 14-DRUG SCREEN, UR
6-Acetylmorphone, urine: NEGATIVE ng/mL
Amphetamine screen, urine: NEGATIVE ng/mL
Barbiturates: NEGATIVE ng/mL
Benzodiazepines: NEGATIVE ng/mL
Buprenorphine, urine: NEGATIVE ng/mL
Cannabinoid: POSITIVE — AB
Carboxy THC confirm.: 21 ng/mL
Creatinine, urine: 211.3 mg/dL (ref 20.0–300.0)
EDDP, urine: NEGATIVE ng/mL
Ethanol, urine QT: NEGATIVE %
Methadone Screen, Urine: NEGATIVE ng/mL
Nitrites, urine: NEGATIVE ug/mL
Opiates: NEGATIVE ng/mL
Oxycodone/Oxymorphone, urine: NEGATIVE ng/mL
PROPOXYPHENE, URINE: NEGATIVE ng/mL
Phencyclidine: NEGATIVE ng/mL
pH, urine: 5.2 (ref 4.5–8.9)

## 2016-06-13 LAB — COCAINE METABOLITE, UR
Benzoylecgonine GC/MS Conf: 3280 ng/mL
COCAINE (METAB.): POSITIVE — AB

## 2016-06-14 ENCOUNTER — Encounter

## 2016-06-14 NOTE — Telephone Encounter (Signed)
-----   Message from Semret T Mebrahtu, MD sent at 06/14/2016  7:42 AM EDT -----  1) Please let pt know that urine drug screen came back positive for cocaine and marijuana.     2) I will no longer prescribe pain medications or any other controlled substances.     3) Will refer to pain management.

## 2016-06-14 NOTE — Progress Notes (Signed)
1) Please let pt know that urine drug screen came back positive for cocaine and marijuana.     2) I will no longer prescribe pain medications or any other controlled substances.     3) Will refer to pain management.

## 2016-06-14 NOTE — Telephone Encounter (Signed)
-----   Message from Rosalio Loud, MD sent at 06/14/2016  7:42 AM EDT -----  1) Please let pt know that urine drug screen came back positive for cocaine and marijuana.     2) I will no longer prescribe pain medications or any other controlled substances.     3) Will refer to pain management.

## 2016-06-14 NOTE — Telephone Encounter (Signed)
2 pt. Identifiers confirmed. Pt. Notified of below. Advised pt. That it may be a while to get scheduled with pain management. Pt. Verbalized understanding. No other questions/concerns at this time.

## 2016-07-07 ENCOUNTER — Encounter

## 2016-09-06 ENCOUNTER — Encounter: Attending: Internal Medicine | Primary: Internal Medicine

## 2016-10-02 NOTE — Telephone Encounter (Signed)
Please call pt to set up a 30 minute appt. For Hospital f/u. Pt  Was admitted at [facility] from 09/24/16-09/27/16 for COPD exacerbation and chest pain.

## 2016-10-03 NOTE — Telephone Encounter (Signed)
Unsuccessful attempt to reach pt for results. Left message for her to call back at her earliest convenience.   Letter generated/mailed.

## 2016-10-03 NOTE — Telephone Encounter (Signed)
Noted below.

## 2016-10-03 NOTE — Telephone Encounter (Signed)
Patient husband returned phone call about scheduling transitional care appt. Patient is heading back to the hospital today (10/03/16). They will call and scheduled an appt after patient comes back home.

## 2016-10-10 ENCOUNTER — Encounter: Attending: Internal Medicine | Primary: Internal Medicine

## 2016-10-17 ENCOUNTER — Ambulatory Visit
Admit: 2016-10-17 | Discharge: 2016-10-17 | Payer: PRIVATE HEALTH INSURANCE | Attending: Internal Medicine | Primary: Internal Medicine

## 2016-10-17 DIAGNOSIS — J441 Chronic obstructive pulmonary disease with (acute) exacerbation: Secondary | ICD-10-CM

## 2016-10-17 MED ORDER — PRAVASTATIN 20 MG TAB
20 mg | ORAL_TABLET | Freq: Every day | ORAL | 3 refills | Status: AC
Start: 2016-10-17 — End: ?

## 2016-10-17 MED ORDER — LISINOPRIL 20 MG TAB
20 mg | ORAL_TABLET | Freq: Every day | ORAL | 3 refills | Status: AC
Start: 2016-10-17 — End: ?

## 2016-10-17 MED ORDER — IBUPROFEN 800 MG TAB
800 mg | ORAL_TABLET | Freq: Three times a day (TID) | ORAL | 0 refills | Status: AC | PRN
Start: 2016-10-17 — End: ?

## 2016-10-17 NOTE — Progress Notes (Signed)
Chief Complaint   Patient presents with   ??? COPD   ??? Hospital Follow Up     [facility]   ??? Medication Refill   ??? Letter for School/Work     return to work Monday 10/23/16       HPI:     Susan [name] is a 66 y.o. African American female with history of  Hypertension, COPD and asthma   here for the above complaint. [facility] 09/[facility]-12/27/17for COPD exacerbation. Pt given nebulizer, IV solumedrol. She got better. She was discharged on z-pack, prednisone taper, and spiriva, albuterol, cough medication. She did not pick up rx yet. She will pick up in the next couple of days. She is feeling better.     [facility]/16-10/04/16 for abdominal pain and vomiting. She was discharged on zofran and ranitidine.      CT abd/pelvis 10/2016 showed:  IMPRESSION:    1.????Subtle pericolonic mesenteric stranding of the sigmoid is suggestive of uncomplicated mild acute diverticulitis.    2.????Borderline thickened proximal duodenal walls may represent mild duodenitis. Otherwise, no CT evidence of an acute intra-abdominal/intra-pelvic process.    3.????Stable mildly lobulated contour the liver, indicative of cirrhosis. Liver function tests and clinical history may be helpful.    4.????Uterine fibroid.    Labs showed elevated white blood cell count. All hospital records were reviewed.     Right shoulder pain for 3 days. No trauma. Pain scale : 8.5 constant when it occurs and happens mainly at night. It is Dull aches. Radiates down right arm and no numbness or tingling. She has not tried anything over the counter.         Past Medical History:   Diagnosis Date   ??? Asthma    ??? Bipolar 1 disorder (HCC)     being followed by Dr. Donnajean LopesHuma Hyder   ??? Chronic obstructive pulmonary disease (HCC)    ??? Dyslipidemia    ??? Hypertension    ??? OSA (obstructive sleep apnea)      Past Surgical History:   Procedure Laterality Date   ??? HX HEENT       Current Outpatient Prescriptions   Medication Sig    ??? predniSONE (DELTASONE) 10 mg tablet 4 tabs x 2 days, 3 tabs x 2 days, 2 tabs x 2 days, 1 tab x 2 days. 1/2 tab x 2 days. Divided dosing.   ??? pravastatin (PRAVACHOL) 20 mg tablet Take 1 Tab by mouth daily.   ??? lisinopril (PRINIVIL, ZESTRIL) 20 mg tablet Take 1 Tab by mouth daily.   ??? ibuprofen (MOTRIN) 800 mg tablet Take 1 Tab by mouth every eight (8) hours as needed for Pain.   ??? albuterol (PROVENTIL VENTOLIN) 2.5 mg /3 mL (0.083 %) nebulizer solution 3 mL by Nebulization route every four (4) hours as needed for Wheezing.   ??? albuterol (PROVENTIL HFA, VENTOLIN HFA, PROAIR HFA) 90 mcg/actuation inhaler Take 1-2 puffs every 4-6 hrs prn shortness of breath   ??? venlafaxine-SR (EFFEXOR XR) 75 mg capsule Take  by mouth daily.   ??? venlafaxine-SR (EFFEXOR-XR) 150 mg capsule Take 150 mg by mouth daily.   ??? OLANZapine (ZYPREXA) 10 mg tablet Take 10 mg by mouth nightly.     No current facility-administered medications for this visit.      Health Maintenance   Topic Date Due   ??? BREAST CANCER SCRN MAMMOGRAM  01/19/2017 (Originally 07/18/2002)   ???  FOBT Q 1 YEAR AGE 39-75  02/10/2017 (Originally 07/18/2002)   ??? PAP AKA CERVICAL CYTOLOGY  02/24/2017 (Originally 07/18/1973)   ??? DTaP/Tdap/Td series (2 - Td) 06/07/2026   ??? Hepatitis C Screening  Completed   ??? ZOSTER VACCINE AGE 34>  Addressed   ??? Pneumococcal 19-64 Medium Risk  Completed   ??? Influenza Age 88 to Adult  Addressed     Immunization History   Administered Date(s) Administered   ??? Influenza Vaccine 06/04/2012   ??? Pneumococcal Polysaccharide (PPSV-23) 07/17/2011, 08/24/2015     No LMP recorded. Patient is postmenopausal.        Allergies and Intolerances:   Allergies   Allergen Reactions   ??? Penicillins Other (comments)     States unknown reaction        Family History:   Family History   Problem Relation Age of Onset   ??? No Known Problems Mother    ??? Cancer Father        Social History:   She  reports that she has been smoking.  She has a 25.00 pack-year smoking  history. She has never used smokeless tobacco.  She  reports that she does not drink alcohol.            OBJECTIVE:   Physical exam:   Visit Vitals   ??? BP 137/78 (BP 1 Location: Left arm, BP Patient Position: Sitting)   ??? Pulse 81   ??? Temp 96 ??F (35.6 ??C) (Oral)   ??? Resp 16   ??? Ht 5\' 7"  (1.702 m)   ??? Wt 177 lb (80.3 kg)   ??? SpO2 96%   ??? BMI 27.72 kg/m2        Generally: Pleasant female in no acute distress  Cardiac Exam: regular, rate, and rhythm. Normal S1 and S2. No murmurs, gallops, or rubs  Pulmonary exam: Clear to auscultation bilaterally  Abdominal exam: Positive bowel sounds in all four quadrants, soft, nondistended, nontender  Extremities: 2+ dorsalis pedis pulses bilaterally. No pedal edema    bilaterally  Right shoulder exam: No TTP in the shoulder. Good ROM. 5/5 strength in upper extremities bilaterally. 2/2 reflexes in upper extremities bilaterally.     LABS/RADIOLOGICAL TESTS:  Lab Results   Component Value Date/Time    WBC 4.2 10/27/2015 12:00 AM    HGB 13.8 10/27/2015 12:00 AM    HCT 41.6 10/27/2015 12:00 AM    PLATELET 227 10/27/2015 12:00 AM     Lab Results   Component Value Date/Time    Sodium 140 01/13/2016 10:47 AM    Potassium 4.1 01/13/2016 10:47 AM    Chloride 96 01/13/2016 10:47 AM    CO2 26 01/13/2016 10:47 AM    Glucose 105 01/13/2016 10:47 AM    BUN 19 01/13/2016 10:47 AM    Creatinine 1.00 01/13/2016 10:47 AM     Lab Results   Component Value Date/Time    Cholesterol, total 252 01/13/2016 10:47 AM    HDL Cholesterol 65 01/13/2016 10:47 AM    LDL, calculated 169 01/13/2016 10:47 AM    Triglyceride 91 01/13/2016 10:47 AM     No results found for: GPT    Previous labs    ASSESSMENT/PLAN:    1. COPD exacerbation (HCC): improving. She will pick up the albuterol, spiriva, z-apck prednisone.     2. Abdominal pain, unspecified abdominal location: resolved. Will monitor.     3. Acute pain of right shoulder: probably arthritis. Use heating pad,  icy/hot, tiger  balm for pain. Given exercise. Will try ibuprofen. Take ibuprofen  With food. If you notice any blood/black tarry stools, diarrhea, abdominal pain, nausea, vomiting then stop medication immediately. Give [place] a call ASAP.  -     ibuprofen (MOTRIN) 800 mg tablet; Take 1 Tab by mouth every eight (8) hours as needed for Pain.    4. Medication refill  -     pravastatin (PRAVACHOL) 20 mg tablet; Take 1 Tab by mouth daily.    5. Dyslipidemia  -     pravastatin (PRAVACHOL) 20 mg tablet; Take 1 Tab by mouth daily.    6. Benign hypertension without CHF  -     lisinopril (PRINIVIL, ZESTRIL) 20 mg tablet; Take 1 Tab by mouth daily.      7.   Requested Prescriptions     Signed Prescriptions Disp Refills   ??? pravastatin (PRAVACHOL) 20 mg tablet 90 Tab 3     Sig: Take 1 Tab by mouth daily.   ??? lisinopril (PRINIVIL, ZESTRIL) 20 mg tablet 90 Tab 3     Sig: Take 1 Tab by mouth daily.   ??? ibuprofen (MOTRIN) 800 mg tablet 30 Tab 0     Sig: Take 1 Tab by mouth every eight (8) hours as needed for Pain.     8. Patient verbalized understanding and agreement with the plan.    9. Patient was given an after-visit summary.    10. Follow-up Disposition:  Return in about 1 month (around 11/17/2016) for f/u HTN, f/u COPD. or sooner if worsening symptoms.          Marsh Dolly, M.D.

## 2016-10-17 NOTE — Progress Notes (Signed)
ROOM # 2    Susan [name] presents today for   Chief Complaint   Patient presents with   ??? COPD   ??? Hospital Follow Up     [facility]   ??? Medication Refill   ??? Letter for School/Work     return to work Monday 10/23/16     Requested Prescriptions     Pending Prescriptions Disp Refills   ??? pravastatin (PRAVACHOL) 20 mg tablet 90 Tab 3     Sig: Take 1 Tab by mouth daily.   ??? lisinopril (PRINIVIL, ZESTRIL) 20 mg tablet 90 Tab 3     Sig: Take 1 Tab by mouth daily.         Susan [name] preferred language for health care discussion is english/other.    Is someone accompanying this pt? no    Is the patient using any DME equipment during OV? no    Depression Screening:  PHQ over the last two weeks 06/07/2016 02/18/2015   Little interest or pleasure in doing things Not at all Not at all   Feeling down, depressed or hopeless Not at all Not at all   Total Score PHQ 2 0 0       Learning Assessment:  Learning Assessment 02/18/2015   PRIMARY LEARNER Patient   HIGHEST LEVEL OF EDUCATION - PRIMARY LEARNER  DID NOT GRADUATE HIGH SCHOOL   BARRIERS PRIMARY LEARNER NONE   CO-LEARNER CAREGIVER No   PRIMARY LANGUAGE ENGLISH   INTERPRETER NEED No   LEARNER PREFERENCE PRIMARY DEMONSTRATION   LEARNING SPECIAL TOPICS no   ANSWERED BY patient   RELATIONSHIP SELF   ASSESSMENT COMMENT none       Abuse Screening:  Abuse Screening Questionnaire 06/07/2016   Do you ever feel afraid of your partner? N   Are you in a relationship with someone who physically or mentally threatens you? N   Is it safe for you to go home? Y       Fall Risk  No flowsheet data found.    Health Maintenance reviewed and discussed per provider. Yes      Advance Directive:  1. Do you have an advance directive in place? Patient Reply: no    2. If not, would you like material regarding how to put one in place? Patient Reply: no    Coordination of Care:  1. Have you been to the ER, urgent care clinic since your last visit?  Hospitalized since your last visit? no     2. Have you seen or consulted any other health care providers outside of the [facility] since your last visit? Include any pap smears or colon screening. no

## 2016-10-17 NOTE — Patient Instructions (Addendum)
1) use hot steamy showers ana simply saline.     2) Take ibuprofen With food. If you notice any blood/black tarry stools, diarrhea, abdominal pain, nausea, vomiting then stop medication immediately. Give us a call ASAP.    3) follow-up in 1 month or sooner if worsening symptoms.     4) pick up meds ASAP       Shoulder Arthritis: Exercises  Your Care Instructions  Here are some examples of typical rehabilitation exercises for your condition. Start each exercise slowly. Ease off the exercise if you start to have pain.  Your doctor or physical therapist will tell you when you can start these exercises and which ones will work best for you.  How to do the exercises  Shoulder flexion (lying down)    To make a wand for this exercise, use a piece of PVC pipe or a broom handle with the broom removed. Make the wand about a foot wider than your shoulders.  1. Lie on your back, holding a wand with both hands. Your palms should face down as you hold the wand.  2. Keeping your elbows straight, slowly raise your arms over your head. Raise them until you feel a stretch in your shoulders, upper back, and chest.  3. Hold for 15 to 30 seconds.  4. Repeat 2 to 4 times.  Shoulder rotation (lying down)    To make a wand for this exercise, use a piece of PVC pipe or a broom handle with the broom removed. Make the wand about a foot wider than your shoulders.  1. Lie on your back. Hold a wand with both hands with your elbows bent and palms up.  2. Keep your elbows close to your body, and move the wand across your body toward the sore arm.  3. Hold for 8 to 12 seconds.  4. Repeat 2 to 4 times.  Shoulder internal rotation with towel    1. Hold a towel above and behind your head with the arm that is not sore.  2. With your sore arm, reach behind your back and grasp the towel.  3. With the arm above your head, pull the towel upward. Do this until you feel a stretch on the front and outside of your sore shoulder.  4. Hold 15 to 30 seconds.   5. Repeat 2 to 4 times.  Shoulder blade squeeze    1. Stand with your arms at your sides, and squeeze your shoulder blades together. Do not raise your shoulders up as you squeeze.  2. Hold 6 seconds.  3. Repeat 8 to 12 times.  Resisted rows    For this exercise, you will need elastic exercise material, such as surgical tubing or Thera-Band.  1. Put the band around a solid object at about waist level. (A bedpost will work well.) Each hand should hold an end of the band.  2. With your elbows at your sides and bent to 90 degrees, pull the band back. Your shoulder blades should move toward each other. Return to the starting position.  3. Repeat 8 to 12 times.  External rotator strengthening exercise    1. Start by tying a piece of elastic exercise material to a doorknob. You can use surgical tubing or Thera-Band. (You may also hold one end of the band in each hand.)  2. Stand or sit with your shoulder relaxed and your elbow bent 90 degrees. Your upper arm should rest comfortably against your side. Squeeze a rolled  towel between your elbow and your body for comfort. This will help keep your arm at your side.  3. Hold one end of the elastic band with the hand of the painful arm.  4. Start with your forearm across your belly. Slowly rotate the forearm out away from your body. Keep your elbow and upper arm tucked against the towel roll or the side of your body until you begin to feel tightness in your shoulder. Slowly move your arm back to where you started.  5. Repeat 8 to 12 times.  Internal rotator strengthening exercise    1. Start by tying a piece of elastic exercise material to a doorknob. You can use surgical tubing or Thera-Band.  2. Stand or sit with your shoulder relaxed and your elbow bent 90 degrees. Your upper arm should rest comfortably against your side. Squeeze a rolled towel between your elbow and your body for comfort. This will help keep your arm at your side.   3. Hold one end of the elastic band in the hand of the painful arm.  4. Slowly rotate your forearm toward your body until it touches your belly. Slowly move it back to where you started.  5. Keep your elbow and upper arm firmly tucked against the towel roll or at your side.  6. Repeat 8 to 12 times.  Pendulum swing    If you have pain in your back, do not do this exercise.  1. Hold on to a table or the back of a chair with your good arm. Then bend forward a little and let your sore arm hang straight down. This exercise does not use the arm muscles. Rather, use your legs and your hips to create movement that makes your arm swing freely.  2. Use the movement from your hips and legs to guide the slightly swinging arm back and forth like a pendulum (or elephant trunk). Then guide it in circles that start small (about the size of a dinner plate). Make the circles a bit larger each day, as your pain allows.  3. Do this exercise for 5 minutes, 5 to 7 times each day.  4. As you have less pain, try bending over a little farther to do this exercise. This will increase the amount of movement at your shoulder.  Follow-up care is a key part of your treatment and safety. Be sure to make and go to all appointments, and call your doctor if you are having problems. It's also a good idea to know your test results and keep a list of the medicines you take.  Where can you learn more?  Go to [employer].  Enter H562 in the search box to learn more about "Shoulder Arthritis: Exercises."  Current as of: December 21, 2015  Content Version: 11.4  ?? 2006-2017 [employer]. Care instructions adapted under license by [employer] (which disclaims liability or warranty for this information). If you have questions about a medical condition or this instruction, always ask your healthcare professional. [employer] disclaims any warranty or liability for your use of this information.

## 2016-11-16 ENCOUNTER — Encounter: Attending: Internal Medicine | Primary: Internal Medicine

## 2017-02-06 ENCOUNTER — Inpatient Hospital Stay
Admit: 2017-02-06 | Discharge: 2017-02-06 | Disposition: A | Discharge status: Home / Self Care | Payer: Self-pay | Attending: Emergency Medicine

## 2017-02-06 ENCOUNTER — Emergency Department: Admit: 2017-02-06 | Payer: Self-pay | Primary: Internal Medicine

## 2017-02-06 DIAGNOSIS — R0789 Other chest pain: Secondary | ICD-10-CM

## 2017-02-06 LAB — METABOLIC PANEL, BASIC
Anion gap: 8 mmol/L (ref 3.0–18)
BUN/Creatinine ratio: 13 (ref 12–20)
BUN: 14 MG/DL (ref 7.0–18)
CO2: 30 mmol/L (ref 21–32)
Calcium: 8.7 MG/DL (ref 8.5–10.1)
Chloride: 101 mmol/L (ref 100–108)
Creatinine: 1.04 MG/DL (ref 0.6–1.3)
GFR est AA: >60 mL/min/{1.73_m2} (ref >60)
GFR est non-AA: 53 mL/min/{1.73_m2} — ABNORMAL LOW (ref >60)
Glucose: 147 mg/dL — ABNORMAL HIGH (ref 74–99)
Potassium: 3.6 mmol/L (ref 3.5–5.5)
Sodium: 139 mmol/L (ref 136–145)

## 2017-02-06 LAB — CBC WITH AUTOMATED DIFF
ABS. BASOPHILS: 0 10*3/uL (ref 0.0–0.06)
ABS. EOSINOPHILS: 0.1 10*3/uL (ref 0.0–0.4)
ABS. LYMPHOCYTES: 2.2 10*3/uL (ref 0.9–3.6)
ABS. MONOCYTES: 0.3 10*3/uL (ref 0.05–1.2)
ABS. NEUTROPHILS: 3.4 10*3/uL (ref 1.8–8.0)
BASOPHILS: 0 % (ref 0–2)
EOSINOPHILS: 2 % (ref 0–5)
HCT: 41.3 % (ref 35.0–45.0)
HGB: 13.5 g/dL (ref 12.0–16.0)
LYMPHOCYTES: 37 % (ref 21–52)
MCH: 31.3 PG (ref 24.0–34.0)
MCHC: 32.7 g/dL (ref 31.0–37.0)
MCV: 95.6 FL (ref 74.0–97.0)
MONOCYTES: 5 % (ref 3–10)
MPV: 10 FL (ref 9.2–11.8)
NEUTROPHILS: 56 % (ref 40–73)
PLATELET: 191 10*3/uL (ref 135–420)
RBC: 4.32 M/uL (ref 4.20–5.30)
RDW: 13.4 % (ref 11.6–14.5)
WBC: 6 10*3/uL (ref 4.6–13.2)

## 2017-02-06 LAB — CARDIAC PANEL,(CK, CKMB & TROPONIN)
CK - MB: 1 ng/ml (ref <3.6)
CK-MB Index: 0.9 % (ref 0.0–4.0)
CK: 113 U/L (ref 26–192)
Troponin-I, QT: <0.02 NG/ML (ref 0.0–0.045)

## 2017-02-06 MED ORDER — NAPROXEN 500 MG TAB
500 mg | ORAL_TABLET | Freq: Two times a day (BID) | ORAL | 0 refills | Status: AC
Start: 2017-02-06 — End: 2017-02-16

## 2017-02-06 MED ORDER — KETOROLAC TROMETHAMINE 30 MG/ML INJECTION
30 mg/mL (1 mL) | INTRAMUSCULAR | Status: AC
Start: 2017-02-06 — End: 2017-02-06
  Administered 2017-02-06: 23:00:00 via INTRAVENOUS

## 2017-02-06 MED FILL — KETOROLAC TROMETHAMINE 30 MG/ML INJECTION: 30 mg/mL (1 mL) | INTRAMUSCULAR | Qty: 1

## 2017-02-06 NOTE — ED Notes (Signed)
Discharge medications reviewed with patient and appropriate educational materials and side effects teaching were provided. I have reviewed discharge instructions with the patient.  The patient verbalized understanding.

## 2017-02-06 NOTE — ED Triage Notes (Signed)
Pt reports left sided chest pain and cough.  Pt was given 324 mg asa and 1 SL Nitro by EMS prior to arrival.

## 2017-02-06 NOTE — ED Provider Notes (Signed)
EMERGENCY DEPARTMENT HISTORY AND PHYSICAL EXAM    5:24 PM      Date: 02/06/2017  Patient Name: Susan [name]    History of Presenting Illness     No chief complaint on file.        History Provided By: Patient    Chief Complaint: CP  Duration: 4 Hours  Timing:  Constant and Waxing and Waning  Location: L sided  Quality: Sharp and Tightness  Severity: 8 out of 10  Modifying Factors: worsened by movement  Associated Symptoms: denies any other associated signs or symptoms      Additional History (Context): Susan [name] is a 66 y.o. female with hypertension, deep vein thrombosis, pulmonary embolism, COPD and asthma who presents per EMS c/o waxing and waning L sided sharp CP x4 hours. Pain radiates to L arm, no hx prior. Pt took asthma treatment PTA with no improvement as she thought pain was tightness from asthma. Pt denies nausea, SOB, fever, leg swelling, cough worse than baseline. Pt on home O2 PRN. No hx MI, CVA; hx DVT/PE, not on blood thinners now.        PCP: Rosalio LoudSemret T Mebrahtu, MD    Current Facility-Administered Medications   Medication Dose Route Frequency Provider Last Rate Last Dose   ??? ketorolac (TORADOL) injection 15 mg  15 mg IntraVENous NOW Laurin CoderAlina M Shemeika Starzyk, DO         Current Outpatient Prescriptions   Medication Sig Dispense Refill   ??? naproxen (NAPROSYN) 500 mg tablet Take 1 Tab by mouth two (2) times daily (with meals) for 10 days. 20 Tab 0   ??? predniSONE (DELTASONE) 10 mg tablet 4 tabs x 2 days, 3 tabs x 2 days, 2 tabs x 2 days, 1 tab x 2 days. 1/2 tab x 2 days. Divided dosing.     ??? pravastatin (PRAVACHOL) 20 mg tablet Take 1 Tab by mouth daily. 90 Tab 3   ??? lisinopril (PRINIVIL, ZESTRIL) 20 mg tablet Take 1 Tab by mouth daily. 90 Tab 3   ??? ibuprofen (MOTRIN) 800 mg tablet Take 1 Tab by mouth every eight (8) hours as needed for Pain. 30 Tab 0   ??? albuterol (PROVENTIL VENTOLIN) 2.5 mg /3 mL (0.083 %) nebulizer solution 3 mL by Nebulization route every four (4) hours as needed for Wheezing. 30  Each 0   ??? albuterol (PROVENTIL HFA, VENTOLIN HFA, PROAIR HFA) 90 mcg/actuation inhaler Take 1-2 puffs every 4-6 hrs prn shortness of breath 3 Inhaler 3   ??? venlafaxine-SR (EFFEXOR XR) 75 mg capsule Take  by mouth daily.     ??? venlafaxine-SR (EFFEXOR-XR) 150 mg capsule Take 150 mg by mouth daily.     ??? OLANZapine (ZYPREXA) 10 mg tablet Take 10 mg by mouth nightly.         Past History     Past Medical History:  Past Medical History:   Diagnosis Date   ??? Asthma    ??? Bipolar 1 disorder (HCC)     being followed by Dr. Donnajean LopesHuma Hyder   ??? Chronic obstructive pulmonary disease (HCC)    ??? Dyslipidemia    ??? Hypertension    ??? OSA (obstructive sleep apnea)        Past Surgical History:  Past Surgical History:   Procedure Laterality Date   ??? HX HEENT         Family History:  Family History   Problem Relation Age of Onset   ??? No Known  Problems Mother    ??? Cancer Father        Social History:  Social History   Substance Use Topics   ??? Smoking status: Current Every Day Smoker     Packs/day: 0.50     Years: 50.00   ??? Smokeless tobacco: Never Used   ??? Alcohol use No       Allergies:  Allergies   Allergen Reactions   ??? Penicillins Other (comments)     States unknown reaction          Review of Systems       Review of Systems   Constitutional: Negative for appetite change and fever.   Respiratory: Positive for cough. Negative for shortness of breath.    Cardiovascular: Positive for chest pain. Negative for leg swelling.   Gastrointestinal: Negative for nausea.   Musculoskeletal:        Positive for L arm pain   All other systems reviewed and are negative.        Physical Exam     Visit Vitals   ??? BP (!) 174/119   ??? Pulse 79   ??? Temp 97.7 ??F (36.5 ??C)   ??? Resp 17   ??? SpO2 98%         Physical Exam   Constitutional: She is oriented to person, place, and time. She appears well-developed and well-nourished.   HENT:   Head: Normocephalic and atraumatic.   Neck: Neck supple. No JVD present.   Cardiovascular: Normal rate and regular rhythm.     Pulmonary/Chest: Effort normal and breath sounds normal. No respiratory distress. She has no wheezes. She exhibits tenderness. She exhibits no crepitus.       Abdominal: Soft. She exhibits no distension. There is no tenderness. There is no rebound and no guarding.   Musculoskeletal: She exhibits no edema.   ROM intact L shoulder  No calf tenderness BL   Neurological: She is alert and oriented to person, place, and time.   Skin: Skin is warm and dry. No erythema.   Psychiatric: Judgment normal.         Diagnostic Study Results     Labs -  Recent Results (from the past 12 hour(s))   EKG, 12 LEAD, INITIAL    Collection Time: 02/06/17  5:28 PM   Result Value Ref Range    Ventricular Rate 72 BPM    Atrial Rate 72 BPM    P-R Interval 190 ms    QRS Duration 82 ms    Q-T Interval 374 ms    QTC Calculation (Bezet) 409 ms    Calculated P Axis 61 degrees    Calculated R Axis 18 degrees    Calculated T Axis 91 degrees    Diagnosis       Normal sinus rhythm  Possible Anterior infarct , age undetermined  T wave abnormality, consider lateral ischemia  Abnormal ECG  When compared with ECG of 03-Nov-2014 10:53,  Questionable change in QRS axis  Nonspecific T wave abnormality, improved in Inferior leads     CBC WITH AUTOMATED DIFF    Collection Time: 02/06/17  5:37 PM   Result Value Ref Range    WBC 6.0 4.6 - 13.2 K/uL    RBC 4.32 4.20 - 5.30 M/uL    HGB 13.5 12.0 - 16.0 g/dL    HCT 16.1 09.6 - 04.5 %    MCV 95.6 74.0 - 97.0 FL    MCH 31.3 24.0 - 34.0 PG  MCHC 32.7 31.0 - 37.0 g/dL    RDW 54.0 98.1 - 19.1 %    PLATELET 191 135 - 420 K/uL    MPV 10.0 9.2 - 11.8 FL    NEUTROPHILS 56 40 - 73 %    LYMPHOCYTES 37 21 - 52 %    MONOCYTES 5 3 - 10 %    EOSINOPHILS 2 0 - 5 %    BASOPHILS 0 0 - 2 %    ABS. NEUTROPHILS 3.4 1.8 - 8.0 K/UL    ABS. LYMPHOCYTES 2.2 0.9 - 3.6 K/UL    ABS. MONOCYTES 0.3 0.05 - 1.2 K/UL    ABS. EOSINOPHILS 0.1 0.0 - 0.4 K/UL    ABS. BASOPHILS 0.0 0.0 - 0.06 K/UL    DF AUTOMATED     METABOLIC PANEL, BASIC     Collection Time: 02/06/17  5:37 PM   Result Value Ref Range    Sodium 139 136 - 145 mmol/L    Potassium 3.6 3.5 - 5.5 mmol/L    Chloride 101 100 - 108 mmol/L    CO2 30 21 - 32 mmol/L    Anion gap 8 3.0 - 18 mmol/L    Glucose 147 (H) 74 - 99 mg/dL    BUN 14 7.0 - 18 MG/DL    Creatinine 4.78 0.6 - 1.3 MG/DL    BUN/Creatinine ratio 13 12 - 20      GFR est AA >60 >60 ml/min/1.55m2    GFR est non-AA 53 (L) >60 ml/min/1.85m2    Calcium 8.7 8.5 - 10.1 MG/DL   CARDIAC PANEL,(CK, CKMB & TROPONIN)    Collection Time: 02/06/17  5:37 PM   Result Value Ref Range    CK 113 26 - 192 U/L    CK - MB 1.0 <3.6 ng/ml    CK-MB Index 0.9 0.0 - 4.0 %    Troponin-I, Qt. <0.02 0.0 - 0.045 NG/ML       Radiologic Studies -     No results found.     XR CHEST PA LAT    As read by Laurin Coder, DO, no acute process.         Medical Decision Making   I am the first provider for this patient.    I reviewed the vital signs, available nursing notes, past medical history, past surgical history, family history and social history.    Vital Signs-Reviewed the patient's vital signs.    Pulse Oximetry Analysis -  98% on room air (Interpretation) nonhypoxic     Cardiac Monitor:  Rate: 83      EKG:  Interpreted by the EP.   Time Interpreted: 1729   Rate: 72   Rhythm: Normal Sinus Rhythm    Interpretation: normal axis, normal intervals, no ST changes, t wave inversion V5-6   Comparison: 11/03/14, t wave inversion unchanged    Records Reviewed: Nursing Notes (Time of Review: 5:24 PM)    ED Course: Progress Notes, Reevaluation, and Consults:        Provider Notes (Medical Decision Making): 66 y/o female presents with chest pain, tender on exam, started 12pm, constant, will check trop, ekg, single trop sufficient. Doubt PE, pt does have hx of prior remote, but no signs of DVT, no risk factors, no tachycardia or hypoxia  eval for pna. Doubt dissection, pt well appearing, no pain into back       DIscussed results with pt. Labs and imaging unremarkable. Stable for dc home. DIscussed return precaution. Suspect  msk source of pain, reproducible. Will give nsaids, pcp follow up.    Diagnosis     Clinical Impression:   1. Chest wall pain        Disposition: Discharge     Follow-up Information     Follow up With Details Comments Contact Info    Semret T Mebrahtu, MD Schedule an appointment as soon as possible for a visit in 2 days As needed for re-evaluation [address]  [address]  [facility]  [place] [address]  [phone]             Patient's Medications   Start Taking    NAPROXEN (NAPROSYN) 500 MG TABLET    Take 1 Tab by mouth two (2) times daily (with meals) for 10 days.   Continue Taking    ALBUTEROL (PROVENTIL HFA, VENTOLIN HFA, PROAIR HFA) 90 MCG/ACTUATION INHALER    Take 1-2 puffs every 4-6 hrs prn shortness of breath    ALBUTEROL (PROVENTIL VENTOLIN) 2.5 MG /3 ML (0.083 %) NEBULIZER SOLUTION    3 mL by Nebulization route every four (4) hours as needed for Wheezing.    IBUPROFEN (MOTRIN) 800 MG TABLET    Take 1 Tab by mouth every eight (8) hours as needed for Pain.    LISINOPRIL (PRINIVIL, ZESTRIL) 20 MG TABLET    Take 1 Tab by mouth daily.    OLANZAPINE (ZYPREXA) 10 MG TABLET    Take 10 mg by mouth nightly.    PRAVASTATIN (PRAVACHOL) 20 MG TABLET    Take 1 Tab by mouth daily.    PREDNISONE (DELTASONE) 10 MG TABLET    4 tabs x 2 days, 3 tabs x 2 days, 2 tabs x 2 days, 1 tab x 2 days. 1/2 tab x 2 days. Divided dosing.    VENLAFAXINE-SR (EFFEXOR XR) 75 MG CAPSULE    Take  by mouth daily.    VENLAFAXINE-SR (EFFEXOR-XR) 150 MG CAPSULE    Take 150 mg by mouth daily.   These Medications have changed    No medications on file   Stop Taking    No medications on file     _______________________________    Attestations:  Scribe Attestation     London Pepper acting as a scribe for and in the presence of Laurin Coder, DO      Feb 06, 2017 at 6:22 PM       Provider Attestation:       I personally performed the services described in the documentation, reviewed the documentation, as recorded by the scribe in my presence, and it accurately and completely records my words and actions. Feb 06, 2017 at 6:22 PM - Laurin Coder, DO    _______________________________

## 2017-02-06 NOTE — ED Notes (Signed)
[facility] care [facility]

## 2017-02-06 NOTE — ED Notes (Signed)
Patient is sitting in bed. States that she is having chest pain with some lightheadedness and twinges of pain in the left arm. Denies SOB or any other pain. Placed patient back on monitor.

## 2017-02-09 LAB — EKG 12-LEAD
Atrial Rate: 72 {beats}/min
Diagnosis: NORMAL
P Axis: 61 degrees
P-R Interval: 190 ms
Q-T Interval: 374 ms
QRS Duration: 82 ms
QTc Calculation (Bazett): 409 ms
R Axis: 18 degrees
T Axis: 91 degrees
Ventricular Rate: 72 {beats}/min

## 2017-02-09 LAB — EKG, 12 LEAD, INITIAL
Atrial Rate: 72 {beats}/min
Calculated P Axis: 61 degrees
Calculated R Axis: 18 degrees
Calculated T Axis: 91 degrees
Diagnosis: NORMAL
P-R Interval: 190 ms
Q-T Interval: 374 ms
QRS Duration: 82 ms
QTC Calculation (Bezet): 409 ms
Ventricular Rate: 72 {beats}/min

## 2017-04-16 NOTE — Progress Notes (Signed)
Attempted to contact pt at hm number regarding Mammogram due, was told by person answering that it was the wrong number.  I have been unable to reach this patient by phone.  A letter is being sent to the last known home address regarding mammogram.

## 2017-06-09 ENCOUNTER — Encounter

## 2017-06-09 ENCOUNTER — Inpatient Hospital Stay: Admit: 2017-06-09 | Payer: MEDICARE | Primary: Internal Medicine

## 2017-06-09 DIAGNOSIS — J439 Emphysema, unspecified: Secondary | ICD-10-CM

## 2017-09-20 NOTE — Telephone Encounter (Signed)
Please call pt to set up a 30 minute appt. [facility] for pneumonia.

## 2018-03-15 ENCOUNTER — Encounter: Attending: Internal Medicine | Primary: Internal Medicine

## 2023-05-25 ENCOUNTER — Encounter

## 2023-06-19 ENCOUNTER — Inpatient Hospital Stay: Admit: 2023-06-19 | Payer: MEDICARE | Attending: Otolaryngology | Primary: Family Medicine

## 2023-06-19 DIAGNOSIS — H90A22 Sensorineural hearing loss, unilateral, left ear, with restricted hearing on the contralateral side: Secondary | ICD-10-CM

## 2023-06-19 MED ORDER — GADOBUTROL 1 MMOL/ML IV SOLN
1 | Freq: Once | INTRAVENOUS | Status: AC | PRN
Start: 2023-06-19 — End: 2023-06-19

## 2023-06-19 MED ADMIN — gadobutrol (GADAVIST) injection 10 mL: 10 mL | INTRAVENOUS | @ 23:00:00 | NDC 50419032509

## 2023-06-19 MED FILL — GADAVIST 1 MMOL/ML IV SOLN: 1 MMOL/ML | INTRAVENOUS | Qty: 15 | Fill #0

## 2023-12-19 ENCOUNTER — Emergency Department: Admit: 2023-12-20 | Payer: Medicare (Managed Care) | Primary: Family Medicine

## 2023-12-19 ENCOUNTER — Inpatient Hospital Stay
Admit: 2023-12-19 | Discharge: 2023-12-20 | Disposition: A | Discharge status: Home / Self Care | Payer: MEDICARE | Attending: Emergency Medicine

## 2023-12-19 DIAGNOSIS — M79671 Pain in right foot: Secondary | ICD-10-CM

## 2023-12-19 NOTE — Progress Notes (Signed)
 Peripheral Vascular Lab Preliminary : Right Lower Extremity Venous Duplex    1. No evidence of deep vein thrombosis noted in the right lower extremity.     2. Biphasic flow noted in the right distal PTA,  and DPA arteries.     Final report to follow   Ubah Radke RVT, RVS

## 2023-12-19 NOTE — ED Triage Notes (Signed)
 Pt presents to ER with complaint of swelling to R foot X 3-4 days. Pt reports some bruising/ discoloration to the foot.     No Known injury to the foot

## 2023-12-19 NOTE — Discharge Instructions (Addendum)
 Follow-up with primary care provider.      Take Tylenol as needed for pain.    Follow-up with podiatry.  If you do not have an established relationship with podiatry, I have given you contact information for [facility] foot and ankle group.    There is no evidence of acute arterial blockage.  No evidence of venous blood clot (DVT).  With swelling and tenderness will treat for possible cellulitis.  Rest.  Elevate above the level of your heart is much as possible.    Results for orders placed or performed during the hospital encounter of 12/19/23   XR FOOT RIGHT (MIN 3 VIEWS)    Narrative    Examination:  XR FOOT RIGHT (MIN 3 VIEWS)    INDICATION: Right foot pain and swelling in region of tarsals and proximal  metatarsals dorsally    COMPARISON: None    WORKSTATION ID: [id number]      Impression    IMPRESSION: No fracture.    Electronically signed by: Mikal Plane, MD 12/19/2023 10:11 PM EDT            Workstation ID: [id number]     BMP   Result Value Ref Range    Potassium 4.1 3.5 - 5.1 mEq/L    Chloride 99 98 - 107 mEq/L    Sodium 138 136 - 145 mEq/L    CO2 34 (H) 20 - 31 mEq/L    Glucose 98 74 - 106 mg/dl    BUN 21 9 - 23 mg/dl    Creatinine 2.13 (H) 0.55 - 1.02 mg/dl    GFR African American 46.0      GFR Non-African American 38      Calcium 10.2 8.7 - 10.4 mg/dl    Anion Gap 5 5 - 15 mmol/L   CBC with Auto Differential   Result Value Ref Range    WBC 7.7 4.0 - 11.0 1000/mm3    RBC 3.86 3.60 - 5.20 M/uL    Hemoglobin 11.4 11.0 - 16.0 gm/dl    Hematocrit 08.6 57.8 - 47.0 %    MCV 95.9 80.0 - 98.0 fL    MCH 29.5 25.4 - 34.6 pg    MCHC 30.8 30.0 - 36.0 gm/dl    Platelets 469 629 - 450 1000/mm3    MPV 9.6 6.0 - 10.0 fL    RDW 49.2 (H) 36.4 - 46.3      Nucleated RBCs 0 0 - 0      Immature Granulocytes % 0.3 0.0 - 3.0 %    Neutrophils Segmented 63.8 34 - 64 %    Lymphocytes 22.8 (L) 28 - 48 %    Monocytes 10.9 1 - 13 %    Eosinophils 1.8 0 - 5 %    Basophils 0.4 0 - 3 %

## 2023-12-19 NOTE — ED Provider Notes (Signed)
 [facility]  Emergency Department Treatment Report    Patient: Susan [name] Age: 73 y.o. Sex: female    Date of Birth: [birth year] Admit Date: 12/19/2023 PCP: Elfredia Nevins, MD   MRN: [id number]  CSN: [id number]  Attending:  Gwenyth Allegra, M.D.   Room: ER34/ER34 Time Dictated: 9:42 PM APP:  None     Chief Complaint   Right foot swelling    History of Present Illness   This is a 73 y.o. female who complains of pain and swelling of the right foot that is been going on for about a week.  Both symptoms started about the same time and have gotten progressively worse.  Hurts to walk on it.  She is not running any fevers.  She denies any history of falls or trauma.  Denies history of gout.  Denies toe pain.  The swelling is predominantly between the midfoot and ankle.  More so on the dorsum than on the plantar aspect.  Patient reports a history of arterial insufficiency and stents to both lower extremities.  She follows up with [facility] vascular surgery, Dr.Vyas, and last saw him 11 months ago.  ABI in the right lower extremity was 0.97 at that time.    Review of Systems   Review of Systems   Constitutional:  Negative for fever.   HENT:  Negative for congestion and sore throat.    Respiratory:  Negative for cough and shortness of breath.    Cardiovascular:  Negative for chest pain.   Gastrointestinal:  Negative for abdominal pain, nausea and vomiting.   Musculoskeletal:  Negative for back pain and neck pain.        Right foot swelling   Skin:  Negative for rash.   Neurological:  Negative for syncope and weakness.   Psychiatric/Behavioral:  The patient is not nervous/anxious.          Past Medical/Surgical History   No past medical history on file.  Peripheral artery disease bilateral lower extremities  COPD  Hypertension  Aortic valvular disease  Pulmonary hypertension  Brain meningioma  Schizoaffective disorder   GERD  Depression  Osteoarthritis    No past surgical history on file.  Left foot hammertoe  surgery  Vascular stents bilateral lower extremities    Social History     Social History     Socioeconomic History    Marital status: Legally Separated     Spouse name: Not on file    Number of children: Not on file    Years of education: Not on file    Highest education level: Not on file   Occupational History    Not on file   Tobacco Use    Smoking status: Not on file    Smokeless tobacco: Not on file   Substance and Sexual Activity    Alcohol use: Not on file    Drug use: Not on file    Sexual activity: Not on file   Other Topics Concern    Not on file   Social History Narrative    Not on file     Social Drivers of Health     Financial Resource Strain: Not on file   Food Insecurity: Not on file   Transportation Needs: Not on file   Physical Activity: Not on file   Stress: Not on file   Social Connections: Not on file   Intimate Partner Violence: Not on file   Housing Stability: Not on file  Quit smoking greater than a year ago.  Does not use alcohol or recreational drugs.    Family History   No family history on file.  Emphysema Brother 1 Dubois     No Known Problems Brother 2 Sherrod     Stomach Cancer Father       Alzheimer's Mother       Emphysema Mother       Heart murmur Mother       Hypertension Mother       Peripherial vascular disease Mother   bilat leg amputations     Current Medications     Previous Medications    No medications on file      Med list taken from cardiology office visit 12/12/2023 [facility] electronic MEDICAL RECORD NUMBER   amLODIPine (NORVASC) 5 mg PO TABS Take 1 Tab by Mouth Once a Day.   atorvastatin (LIPITOR) 10 mg PO TABS Take 1 Tab by Mouth Every Night at Bedtime.   BESIVANCE 0.6 % OP DrpS Instill in both eyes Once a Day.   cariprazine (VRAYLAR) 1.5 mg PO CAPS Take 1 Tab by Mouth Once a Day.   clopidogreL (PLAVIX) 75 mg PO TABS Take 1 Tab by Mouth Once a Day.   diazePAM (VALIUM) 5 mg PO TABS Take 1 Tab by Mouth Twice Daily.   fluticasone-salmeterol HFA (ADVAIR HFA) 230-21  mcg/actuation INH HFAA Take 2 Puffs inhaled by mouth Every 12 hours.   fluticasone-umeclidin-vilanter (TRELEGY ELLIPTA) 200-62.5-25 mcg INH DsDv Take 1 Puff inhaled by mouth Once a Day. Rinse mouth after use   gentamicin (GARAMYCIN) 0.3 % OP Drop Instill 1 Drop in Left eye Once a Day.   ipratropium (ATROVENT) 0.02 % INH SOLN Take 2.5 mL inhaled by mouth Every 6 Hours As Needed for Wheezing (asthma). Use in nebulizer q4-6 hrs as directed.   Lacosamide 200 mg PO TABS Take 1 Tab by Mouth Twice Daily.   mirabegron (MYRBETRIQ) 25 mg PO TB24 Take 1 Tab by Mouth Once a Day.   moxifloxacin (AVELOX) 400 mg PO TABS Take 1 Tab by Mouth Once a Day.   pantoprazole (PROTONIX) 40 mg PO TBEC Take 1 Tab by Mouth Once a Day.   potassium chloride ER (K-DUR;KLOR-CON M) 10 mEq PO tablet,extended release (part/cryst) Take 1 Tab by Mouth Once a Day.   prednisoLONE acetate (PRED FORTE) 1 % OP DrpS Instill 1 Drop in both eyes 2 (two) times a day.   QUEtiapine (SEROQUEL) 100 mg PO TABS Take 1 Tab by Mouth Every Night at Bedtime.   tiotropium bromide (SPIRIVA RESPIMAT) 2.5 mcg/actuation INH Mist Take 2 Puffs inhaled by mouth Once a Day.   valsartan (DIOVAN) 160 mg PO TABS Take 1 Tab by Mouth 2 (two) times a day.     Allergies     Allergies   Allergen Reactions    Penicillins Other (See Comments)     States unknown reaction     Mother told her as a child she was allergic     Physical Exam     Vitals:    12/19/23 1825 12/19/23 1940 12/19/23 2000 12/19/23 2030   BP:  104/86 (!) 157/84 (!) 162/82   Pulse:       Resp:       Temp:       TempSrc:       SpO2:  94% 94% 95%   Weight: 88.5 kg (195 lb)      Height: 1.702 m (5\' 7" )  Physical Exam  Constitutional:       Appearance: She is not ill-appearing or diaphoretic.   HENT:      Head: Normocephalic and atraumatic.      Right Ear: External ear normal.      Left Ear: External ear normal.      Nose: Nose normal.      Mouth/Throat:      Mouth: Mucous membranes are moist.      Pharynx: Oropharynx  is clear.   Eyes:      General: No scleral icterus.     Conjunctiva/sclera: Conjunctivae normal.   Cardiovascular:      Rate and Rhythm: Normal rate and regular rhythm.      Heart sounds:      No friction rub.   Pulmonary:      Effort: No respiratory distress.      Breath sounds: Decreased breath sounds and wheezing present. No rales.      Comments: Scattered end expiratory wheezes  Abdominal:      General: There is no distension.      Palpations: Abdomen is soft.      Tenderness: There is no abdominal tenderness. There is no guarding.   Musculoskeletal:         General: Swelling (Right foot between ankle and proximal metatarsals on dorsum) and tenderness present.      Cervical back: Normal range of motion.   Skin:     General: Skin is warm and dry.      Findings: No erythema or rash.      Comments: No obvious color difference between the 2 feet.  Right foot may be a little bit warm compared to the left.  Definitely tender to palpation.   Neurological:      Mental Status: She is alert and oriented to person, place, and time.      GCS: GCS eye subscore is 4. GCS verbal subscore is 5. GCS motor subscore is 6.      Cranial Nerves: No dysarthria or facial asymmetry.      Motor: No weakness.      Comments: Sensation unchanged and at baseline in bilateral lower extremities.   Psychiatric:         Mood and Affect: Mood normal.         Speech: Speech normal.         Behavior: Behavior is cooperative.       Impression and Management Plan   Patient presents with right foot pain and swelling.  History of peripheral vascular disease.  I ordered PVL right lower extremity.  I initially ordered an ABI as well however with hand-held Doppler I was able to hear biphasic pulses in dorsalis pedis and posterior tibial bilaterally.    Differential diagnoses: Arterial insufficiency, DVT/venous insufficiency, occult trauma/stress fracture, doubt, cellulitis    Prehospital EKG (by my interpretation):    Cardiac Monitor (by my  interpretation):     EKG (by my interpretation):    Diagnostic Studies   Lab:   Results for orders placed or performed during the hospital encounter of 12/19/23   BMP   Result Value Ref Range    Potassium 4.1 3.5 - 5.1 mEq/L    Chloride 99 98 - 107 mEq/L    Sodium 138 136 - 145 mEq/L    CO2 34 (H) 20 - 31 mEq/L    Glucose 98 74 - 106 mg/dl    BUN 21 9 - 23 mg/dl    Creatinine  1.45 (H) 0.55 - 1.02 mg/dl    GFR African American 46.0      GFR Non-African American 38      Calcium 10.2 8.7 - 10.4 mg/dl    Anion Gap 5 5 - 15 mmol/L   CBC with Auto Differential   Result Value Ref Range    WBC 7.7 4.0 - 11.0 1000/mm3    RBC 3.86 3.60 - 5.20 M/uL    Hemoglobin 11.4 11.0 - 16.0 gm/dl    Hematocrit 16.1 09.6 - 47.0 %    MCV 95.9 80.0 - 98.0 fL    MCH 29.5 25.4 - 34.6 pg    MCHC 30.8 30.0 - 36.0 gm/dl    Platelets 045 409 - 450 1000/mm3    MPV 9.6 6.0 - 10.0 fL    RDW 49.2 (H) 36.4 - 46.3      Nucleated RBCs 0 0 - 0      Immature Granulocytes % 0.3 0.0 - 3.0 %    Neutrophils Segmented 63.8 34 - 64 %    Lymphocytes 22.8 (L) 28 - 48 %    Monocytes 10.9 1 - 13 %    Eosinophils 1.8 0 - 5 %    Basophils 0.4 0 - 3 %      Imaging:    XR FOOT RIGHT (MIN 3 VIEWS)  Result Date: 12/19/2023  Examination:  XR FOOT RIGHT (MIN 3 VIEWS) INDICATION: Right foot pain and swelling in region of tarsals and proximal metatarsals dorsally COMPARISON: None WORKSTATION ID: [id number]     IMPRESSION: No fracture. Electronically signed by: Mikal Plane, MD 12/19/2023 10:11 PM EDT          Workstation ID: [id number]     ED Course/Medical Decision Making   Electrolytes unremarkable.  BUN and creatinine 21 and 1.45.  Creatinine is 1.1 on 12/12/2023.  Glucose normal.  WBC normal.  Hemoglobin hematocrit and platelets are normal.  No left shift.    Preliminary PVL negative for DVT.  PVL tech also notes biphasic dorsalis pedis pulses bilaterally.      Right foot x-rays per my interpretation show no evidence of fracture, subcutaneous air or acute bony  pathology.      RECORDS REVIEWED:  I reviewed the patient's previous records here at [facility] and available outside facilities and note that the patient was seen by cardiology in [facility] electronic medical record on 12/12/2023.  BP 102/56 at that time.  Pulse 65.  Assessment and plan as follows:    1. Aortic valve calcification.  The patient's EKG results are within normal limits, with a heart rate of 59 bpm. Blood pressure readings are also satisfactory at 102/56 mmHg. Echo with aortic valve sclerosis without stenosis 2918, no recent study, murmur consistent with mild aortic valve sclerosis without stenosis or aortic stenosis. Echo for furhter evaluations.     2. Chronic obstructive pulmonary disease (COPD).  She has COPD and uses home oxygen as needed. She reports no recent episodes of bronchitis or pneumonia. She is advised to continue using oxygen therapy to keep oxygen saturation above 92%. She is encouraged to avoid smoking to prevent further lung damage.    3. Pulmonary hypertension.  Imaging suggests pulmonary hypertension. She is advised to continue monitoring her condition and use oxygen therapy as needed to manage symptoms. The upcoming echocardiogram will also check for pulmonary hypertension.    4. Hypertension.  She is currently taking amlodipine 5 mg daily and valsartan 160 mg twice a day. Continue her  current medication regimen and monitor her blood pressure regularly. If edema worsens may need to wean Norvasc. In the past had PRN diuretic use.    5. Hyperlipidemia.  She is taking atorvastatin. Recent lab work from 05/04/2023, shows total cholesterol at 124 mg/dL and LDL at 72 mg/dL. She is advised to continue her current medication and dietary regimen.    6. Schizoaffective disorder vs bipolar disorder per family  She has a history of schizoaffective disorder. She is advised to follow up with Dr. To for medication management and any necessary referrals to psychiatry.    7. PAD  She has a history of lower  extremity stents placed in 2023. No claudication at present. Continue statin and Plavix    Follow-up  The patient will follow up in 1 year.     Also seen by vascular surgery 01/16/2023.    EXTERNAL RESULTS REVIEWED: Previous creatinine 1.1 last year in August.  Most recent ABI per vascular surgery indicates right lower extremity ABI 0.97.  Left lower extremity 0.71.  Conclusions normal right lower extremity arterial examination at rest.  Moderate left lower extremity arterial insufficiency at the level of pop artery.    INDEPENDENT HISTORIAN:  History and/or plan development assisted by: Daughter at the bedside corroborates HPI and historical features noted above.    Severe exacerbation or progression of chronic illness: Acute undiagnosed condition    Threat to body function without evaluation and management: Musculoskeletal, cardiovascular      SOCIAL DETERMINANTS  impacting Evaluation and Management:     Personal Health Habits and Adaptability-medical comorbidity limits adaptability    Health services lack of availability of specialty care and primary care after hours/on weekends.    Comorbidities impacting Evaluation and Management: COPD, hypertension, peripheral artery disease     I have recommended postop shoe.  Elevation, rest, Tylenol for pain.  Podiatry follow-up.  Will cover with doxycycline for possible cellulitis x 7 days.  First dose given in the emergency department tonight.  Follow-up with PCP and/or podiatry.  Procedures    Final Diagnosis       ICD-10-CM    1. Right foot pain  M79.671       2. Localized swelling of right foot  R22.41           Disposition   Discharge home.    Gwenyth Allegra, M.D.  December 19, 2023    My signature above authenticates this document and my orders, the final    diagnosis (es), discharge prescription (s), and instructions in the Epic    record.  If you have any questions please contact [phone].     Nursing notes have been reviewed by the physician/ advanced practice     Clinician.  Dragon medical dictation software was used for portions of this report. Unintended voice recognition errors may occur.     Gwenyth Allegra, MD  12/19/23 2229

## 2023-12-19 NOTE — ED Notes (Signed)
 Pt c/o pain and swelling to R foot, denies any injury. Pt has good PMS, dorsal pulse.      Kandice Moos, RN  12/19/23 [phone]

## 2023-12-20 LAB — CBC WITH AUTO DIFFERENTIAL
Basophils: 0.4 % (ref 0–3)
Eosinophils: 1.8 % (ref 0–5)
Hematocrit: 37 % (ref 35.0–47.0)
Hemoglobin: 11.4 g/dL (ref 11.0–16.0)
Immature Granulocytes %: 0.3 % (ref 0.0–3.0)
Lymphocytes: 22.8 % — ABNORMAL LOW (ref 28–48)
MCH: 29.5 pg (ref 25.4–34.6)
MCHC: 30.8 g/dL (ref 30.0–36.0)
MCV: 95.9 fL (ref 80.0–98.0)
MPV: 9.6 fL (ref 6.0–10.0)
Monocytes: 10.9 % (ref 1–13)
Neutrophils Segmented: 63.8 % (ref 34–64)
Nucleated RBCs: 0 (ref 0–0)
Platelets: 297 10*3/uL (ref 140–450)
RBC: 3.86 M/uL (ref 3.60–5.20)
RDW: 49.2 — ABNORMAL HIGH (ref 36.4–46.3)
WBC: 7.7 10*3/uL (ref 4.0–11.0)

## 2023-12-20 LAB — BASIC METABOLIC PANEL
Anion Gap: 5 mmol/L (ref 5–15)
BUN: 21 mg/dL (ref 9–23)
CO2: 34 meq/L — ABNORMAL HIGH (ref 20–31)
Calcium: 10.2 mg/dL (ref 8.7–10.4)
Chloride: 99 meq/L (ref 98–107)
Creatinine: 1.45 mg/dL — ABNORMAL HIGH (ref 0.55–1.02)
GFR African American: 46
GFR Non-African American: 38
Glucose: 98 mg/dL (ref 74–106)
Potassium: 4.1 meq/L (ref 3.5–5.1)
Sodium: 138 meq/L (ref 136–145)

## 2023-12-20 MED ORDER — DOXYCYCLINE HYCLATE 100 MG PO CAPS
100 | ORAL | Status: AC
Start: 2023-12-20 — End: 2023-12-19
  Administered 2023-12-20: 02:00:00 100 mg via ORAL

## 2023-12-20 MED ORDER — DOXYCYCLINE HYCLATE 100 MG PO TABS
100 | ORAL_TABLET | Freq: Two times a day (BID) | ORAL | 0 refills | Status: AC
Start: 2023-12-20 — End: 2023-12-26

## 2023-12-20 MED FILL — DOXYCYCLINE HYCLATE 100 MG PO CAPS: 100 MG | ORAL | Qty: 1

## 2024-01-29 NOTE — Progress Notes (Signed)
 CARE TEAM:  Patient Care Team:  To, Garnette RAMAN, MD as PCP - General (Family Medicine)    ASSESSMENT:  1. Peripheral arterial disease    2. Lumbar spine pain    3. Paresthesia of foot    4. Fluid collection (edema) in the arms, legs, hands and feet         Patient Active Problem List   Diagnosis   . Aortic valve calcification   . Atherosclerosis of native artery of extremity with intermittent claudication   . Basilar artery aneurysm   . Bipolar 1 disorder   . Chronic obstructive pulmonary disease   . Severe depression   . Essential hypertension   . GERD (gastroesophageal reflux disease)   . Hyperthyroidism   . Peripheral neuropathy   . Primary osteoarthritis of right knee   . Psychiatric disorder   . Pulmonary hypertension   . Schizoaffective disorder       PLAN:  Treatment Plan:   Orders Placed This Encounter   . X-ray lumbar spine complete 4+ views ([id number])   . BMI Patient Education   . gabapentin (NEURONTIN) 300 MG capsule        The patient was seen and examined. Today's findings are consistent with RLE edema concerning for PVD/PAD. At this point highly unlikely her unilateral leg swelling due to her spine. Is due to see neurovascular surgeon Dr. Washington later this week, informed her she may ask him for second opinion. Discussed case with Dr. Damita who agrees it is unlikely her sx are from spine. Informed patient she should see cardiologist/vascular specialist again for further workup and recommendations. In the mean time trial of gabapentin given.    Follow-up: No follow-ups on file.       HISTORY OF PRESENT ILLNESS:  Chief Complaint: Pain of the Lower Back   Age: 73 y.o.    Sex: female   Hand-dominance: Right    Referred by     Ms. Gal presents today unaccompanied and ambulating without external support for evaluation of her LUMBAR spine.   Symptoms have been present for about 2 months. She reports RIGHT foot swelling and pain. She reports little pain in her lumbar spine and most of her symptoms are in her  RIGHT foot.   Symptoms began after no known injury  Describes symptoms as constant, sharp, and throbbing  Pain level: 8/10, worse being a 10/10  Symptoms radiate to her RIGHT foot. She reports numbness and tingling in her RIGHT foot.  Exacerbated by laying down  Has tried nothing for relief.    has not tried physical therapy  has not tried injections      Relevant Surgical History: none  Relevant Imaging/Studies: none  Relevant Medications: none  Relevant Encounters: none  Relevant Allergies: penicillin    Diabetic: No  Blood Thinners: Plavix    OBJECTIVE:    Constitutional: moderate distress.    Eyes: Sclera are nonicteric.  Respiratory:  No labored breathing.  Cardiovascular:  No marked edema.  Skin: No marked skin ulcers.  Psychiatric: Alert and oriented x3.    Motor Examination (bulk, tone, strength):   Normal muscle bulk  Normal tone.   Ambulates with walker    Lower extremity Right Left   Hip ABduction 5/5 5/5   Hip ADduction 5/5 5/5   Hip Flexion (L1/2) 5/5 5/5   Knee Extension (L3) 5/5 5/5   Ankle Dorsiflexion (L4) 5/5 5/5   Extensor Hallucis Longus (L5) 5/5 5/5   Ankle Plantarflexion (  S1) 5/5 5/5   Knee Flexion (S2) 5/5 5/5     Significant pitting edema noted to R lower leg and foot. None on the left.    Sensory:  Light-touch: Grossly intact       IMAGING / STUDIES:  PVL ANKLE-BRACHIAL INDEX  Order: [id number]  Narrative    Bilateral: A bilateral lower extremity ankle brachial index exam was performed. Continuous wave Doppler tracings were recorded from the posterior tibial and dorsalis pedis arteries bilaterally, and resting ankle and brachial pressures were measured.  Right: Right ankle and digital pressures were deferred due to extreme pain and movement.  The right lower extremity waveforms are monophasic in the posterior tibial and dorsalis pedis arteries.  The waveform in the right first digit is dampened.  Left: The ankle brachial index is 0.47 on the left. The left lower extremity waveforms are  monophasic in the posterior tibial and dorsalis pedis arteries.  Conclusions: Technically compromised study as described in the findings.  Severe left lower extremity arterial insufficiency by this resting exam.  Waveform analysis indicates moderate right lower extremity arterial insufficiency.  Right first digital disease is present and consistent with known proximal arterial insufficiency.  Compared to the exam from 01/12/2023, there has been progression of disease in the right lower extremity based on waveform analysis. The left ankle brachial index has decreased from 0.71 to 0.47.  Exam End: 12/25/23  1:50 PM    Specimen Collected: 12/25/23  2:54 PM Last Resulted: 12/25/23  2:54 PM   Received From: [facility]  Result Received: 01/29/24  8:25 AM     VL DUP LOWER EXTREMITY VENOUS RIGHT  Order: [id number]  Narrative                                                                                                                                        Version: 1                                                                                                                                      Study ID: [id number]  [facility]                                                        [address] [place], [place]  [address]                                                       Lower Extremity Venous Duplex Report    Name: ASENETH, HACK Date: 12/19/2023, 8: 64 PM  MRN: [id number]                                                               Patient Location: [id number]  Age: 13 Years                                                               DOB: [birth year] (MM/DD/YYYY)                                                                                Gender: Female  Performed By: Karol Leos, RVT                                          Account #:: [id number]  Ordering Physician: MARANDA EVALENE RAMAN  Reason For Study: Pain and swelling.                                                      INTERPRETATION SUMMARY  1. No evidence of deep vein thrombosis in the right lower extremity.    QUALITY/PROCEDURE  Limited unilateral venous  duplex performed of the right leg.    RIGHT LEG  The deep venous system of the right lower extremity was examined using duplex ultrasound.  B-mode imaging demonstrated normal compressibility of the right common femoral, femoral, popliteal, posterior tibial and peroneal  veins. Doppler flow signals were spontaneous and phasic at all levels.    RIGHT SAPHENOUS VEINS  Right great saphenous vein at the saphenofemoral junction demonstrates normal compressibility with spontaneous and phasic venous  hemodynamics.    LEFT LEG  There is normal compressibility of the contralateral common femoral vein with spontaneous and phasic venous hemodynamics.  PROCEDURES:  Procedures     Supervising Physician: Elsie Moselle  [place] Belvie Rodriguez, [place]

## 2024-01-30 NOTE — Progress Notes (Signed)
 Office Visit Note  Patient:  Susan [name]   DOB:  [birth year]    MRN:  [id number]   01/30/2024, 9:45 AM     Identifier:  Ronelle Michie is a 73 y.o. female seen 01/30/2024 for HYPERTENSION    Impressions/Recommendations:  Ms. Riehle was seen 01/30/2024 for the following conditions with recommendations as follows:  Assessment & Plan  1. Peripheral arterial disease (PAD) with right foot pain:  - Right foot pain possibly arthritis vs PAD, has seen vascular, and has ABI test results from 12/2023  - ABI showed severe left leg artery disease and moderate right leg artery disease, but had angiogram without intervention last month (no records)  - Took Prednisone provided some relief  - Referral to a podiatrist for further evaluation and management of foot pain    2. Hypertension:  - Blood pressure well-controlled at 106/68 mmHg on current regimen of Norvasc, Coreg, and Diovan  - Continue medications as prescribed    3. Hyperlipidemia:  - LDL cholesterol level elevated at 123 mg/dL as of 96/87/7974  - Increase atorvastatin dosage from 10 mg to 80 mg  - Prescription for 90 tablets with three refills sent to [facility]  - If adverse effects occur, reduce dosage back to 10 mg and inform clinic immediately  - Labs to be rechecked in approximately 3 months to assess effectiveness of increased dosage  - If higher dose of atorvastatin is not effective, consider adding Zetia    4. PAD  Med treatment and risk factor modification    5. AV calcicification  Echo pending    6. COPD/Pulmonary hypertension  BP control  O2 as needed to keep O2 sats > 92  Echo for follow up  Diuretic if needed    Follow-up:  - Patient to follow up in 12/2024    Diagnoses and associated orders from this visit  1. Aortic valve calcification    2. Essential hypertension    3. Pulmonary hypertension (HCC)    4. Chronic obstructive pulmonary disease, unspecified COPD type (HCC)    5. Atherosclerosis of native artery of both lower extremities with  intermittent claudication    6. Hypercholesterolemia  - Lipid Complete Panel; Future  - Comprehensive Metabolic Panel; Future    7. Foot pain, right  - REFERRAL TO PODIATRY      No follow-ups on file.    Active Problem List:  Patient Active Problem List    Diagnosis Date Noted   . Aortic valve calcification [I35.9] 12/02/2023     Priority: A     Echo 09/14/17--EF 75%, mild cLVH, AV calcification/sclerosis, trace AR, TR, RVSP 48, aortic root 3.2  CT lung screening 10/11/23--AV calcification, mild pulmonary artery enlargement, no definite coronary artery calcifications     . Essential hypertension [I10] 01/05/2015     Priority: B   . History of tobacco abuse--Quit 2024 [Z87.891] 01/05/2015     Priority: B   . Pulmonary hypertension (HCC) [I27.20] 12/02/2023     Priority: C   . On home O2 (2 l/m) [Z99.81] 01/07/2015     Priority: C   . COPD (chronic obstructive pulmonary disease)  [J44.9] 01/05/2015     Priority: C   . History of chest pain [Z87.898] 12/02/2023     Priority: D     NST 12/12/12--normal perfusion, EF 64%  NST 09/18/17--normal perfusion, EF 59%     . Atherosclerosis of native artery of extremity with intermittent claudication [I70.219] 01/16/2023     Priority:  E     Bilateral lower extremity stents done in [place]  2023  6.0 x 40 mm X 135 mm Absolute Pro Stent 08/09/22 (by stent card) left lower extremity (per pt)  No record of right stent     . 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 Benign meningioma of brain (HCC) [D32.0] 11/20/2023   . Primary osteoarthritis of right knee [M17.11] 01/16/2023   . Schizoaffective disorder  [F25.9] 02/15/2017   . GERD (gastroesophageal reflux disease) [K21.9] 01/05/2015   . Hypovitaminosis D [E55.9] 01/05/2015   . Macrocytosis [D75.89] 01/05/2015     With normal B12, folate     . Depression [F32.A] 07/03/2013   . Overdose [T50.901A] 07/02/2013       SUBJECTIVE/INTERVAL EVENTS:  Chief Complaint   Patient presents with   . HYPERTENSION       Susan [name] is a 73 y.o.  female who presents today for follow up of:      ICD-9-CM ICD-10-CM   1. Aortic valve calcification  424.1 I35.9   2. Essential hypertension  401.9 I10   3. Pulmonary hypertension (HCC)  416.8 I27.20   4. Chronic obstructive pulmonary disease, unspecified COPD type (HCC)  496 J44.9   5. Atherosclerosis of native artery of both lower extremities with intermittent claudication  440.21 I70.213   6. Hypercholesterolemia  272.0 E78.00   7. Foot pain, right  729.5 M79.671         History of Present Illness  The patient is a 73 year old female with a medical history significant for aortic valve calcification, hypertension, tobacco use disorder, chronic obstructive pulmonary disease (COPD) requiring home oxygen therapy, and mild pulmonary hypertension as evidenced by echocardiogram in 2018. She also has a history of peripheral arterial disease, necessitating peripheral intervention with stenting to the left lower extremity in November 2023. The patient reports bilateral lower extremity stenting but possesses documentation for only the left lower leg. She has a history of normal nuclear stress tests conducted in 2014 and 2018. She was last evaluated in March 2025 and presents today for follow-up.    The patient has been experiencing pain in her right foot, which has been attributed to arterial disease rather than arthritis. The pain radiates from the ankle to the foot. She has previously undergone stenting procedures in her lower extremities. A vascular specialist evaluated her and performed an ankle-brachial index (ABI) test on December 25, 2023, revealing severe arterial disease in the left leg and moderate arterial disease in the right leg. She underwent a procedure at an outpatient ambulatory center, where imaging studies were conducted without any stenting. An orthopedic specialist suggested that the pain might be due to a pinched nerve and recommended consultation with a cardiologist due to her cardiac condition. Her  right foot is swollen, and she was prescribed prednisone, which provided temporary relief.    The patient continues to use supplemental oxygen as needed at home and reports stable respiratory status. Her current antihypertensive regimen includes amlodipine (Norvasc), carvedilol (Coreg), and valsartan (Diovan). She is also on atorvastatin (Lipitor) 10 mg for hyperlipidemia management.    She has an upcoming neurosurgery appointment in May 2025 for an aneurysm and a pulmonary consultation later in May 2025. An echocardiogram is scheduled for March 21, 2024.    SOCIAL HISTORY  Tobacco: History of tobacco abuse.    ALLERGIES:  Allergies   Allergen Reactions   . Penicillins unknown     Mother told her as a child she was allergic  SCHEDULED MEDICATIONS:  Home Medication List - Marked as Reviewed on 01/30/24 0918   Medication Sig   amLODIPine (NORVASC) 5 mg PO TABS Take 1 Tab by Mouth Once a Day.   atorvastatin (LIPITOR) 80 mg PO TABS Take 1 Tab by Mouth Every Night at Bedtime.   cariprazine (VRAYLAR) 1.5 mg PO CAPS Take 1 Tab by Mouth Once a Day.   carvediloL (COREG) 12.5 mg PO TABS Take 1 Tab by Mouth 2 Times Daily with Meals.   clopidogreL (PLAVIX) 75 mg PO TABS Take 1 Tab by Mouth Once a Day.   fluticasone-salmeterol HFA (ADVAIR HFA) 230-21 mcg/actuation INH HFAA Take 2 Puffs inhaled by mouth Every 12 hours.  Patient taking differently: Take 2 Puffs inhaled by mouth Every 12 hours. As needed   fluticasone-umeclidin-vilanter (TRELEGY ELLIPTA) 200-62.5-25 mcg INH DsDv Take 1 Puff inhaled by mouth Once a Day. Rinse mouth after use   furosemide  (LASIX ) 40 mg PO TABS Take 1 Tab by Mouth Once a Day.   gabapentin (NEURONTIN) 300 mg PO CAPS Take 1 Cap by Mouth 3 Times Daily.   GEMTESA 75 mg PO TABS Once a Day.   ipratropium (ATROVENT) 0.02 % INH SOLN Take 2.5 mL inhaled by mouth Every 6 Hours As Needed for Wheezing (asthma). Use in nebulizer q4-6 hrs as directed.   Lacosamide 200 mg PO TABS Take 1 Tab by Mouth Twice Daily.    mirabegron (MYRBETRIQ) 25 mg PO TB24 Take 1 Tab by Mouth Once a Day.   pantoprazole (PROTONIX) 40 mg PO TBEC Take 1 Tab by Mouth Once a Day.   potassium chloride ER (K-DUR;KLOR-CON M) 10 mEq PO tablet,extended release (part/cryst) Take 1 Tab by Mouth Once a Day.   QUEtiapine (SEROQUEL) 100 mg PO TABS Take 1 Tab by Mouth Every Night at Bedtime.   valsartan (DIOVAN) 160 mg PO TABS Take 1 Tab by Mouth 2 (two) times a day.       PHYSICAL EXAM:  BP 106/68   Pulse 72   Ht 5' 7 (1.702 m)   Wt 86.2 kg (190 lb)   BMI 29.76 kg/m   Wt Readings from Last 5 Encounters:   01/30/24 86.2 kg (190 lb)   12/25/23 88.5 kg (195 lb)   12/12/23 89.9 kg (198 lb 3.2 oz)   11/27/23 91.6 kg (202 lb)   11/20/23 89.8 kg (198 lb)     General:  Patient is alert and oriented times three. WD/WN in NAD  CVS exam: RRR  Lungs: Clear to auscultation bilaterally   No rales, rhonchi, or wheezes   Normal respiratory effort without sternal retractions/accessory muscle use  Abd: Non-tender, non-distended   Normal active bowel sounds   No hepatosplenomegaly, mass or bruit  Extremities: No clubbing, cyanosis, mild right foot edema, in walking shoe, tender to touch but no erythema/warmth    Lab Review:  Basic Metabolic Profile  Lab Results   Component Value Date    NA 137 01/17/2024    POTASSIUM 4.8 01/17/2024    CHLORIDE 97 (L) 01/17/2024    CO2 29 01/17/2024    BUN 21 01/17/2024    CREAT 1.1 01/17/2024    GLUCOSE 87 01/17/2024    CALCIUM 10.3 01/17/2024    MAGNESIUM 2.1 09/25/2016    PHOSPHORUS 3.1 09/25/2016     CBC w/Diff  Lab Results   Component Value Date/Time    WBC 7.6 01/17/2024 11:32 AM    RBC 3.63 (L) 01/17/2024 11:32 AM    HEMOGLOBIN  10.8 (L) 01/17/2024 11:32 AM    HCT 34.8 (L) 01/17/2024 11:32 AM    MCV 96 01/17/2024 11:32 AM    MCH 30 01/17/2024 11:32 AM    MCHC 31 01/17/2024 11:32 AM    RDW 13.8 01/17/2024 11:32 AM    PLATELET 294 01/17/2024 11:32 AM    MPV 10.4 01/17/2024 11:32 AM    SEGS 69 12/12/2023 08:55 AM    LYMPHOCYTES 20  12/12/2023 08:55 AM    MONOS 10 12/12/2023 08:55 AM    EOS 1 12/12/2023 08:55 AM    BASOS 0 12/12/2023 08:55 AM     Hepatic Function  Lab Results   Component Value Date    ALBUMIN 4.1 12/12/2023    TOTPR 7.8 12/12/2023    BILID <0.2 10/03/2016    BILIT 0.3 12/12/2023    SGPTALT 9 12/12/2023    SGOTAST 15 12/12/2023    ALKPHOS 88 12/12/2023    LIPASE <20 10/03/2016     Coagulation  Lab Results   Component Value Date    PT 11.2 10/10/2022    INR 1.00 10/10/2022    APTT 24 07/01/2013     Chemistry  Lab Results   Component Value Date/Time    CHOLESTEROL 180 12/12/2023 08:51 AM    HDL 44 12/12/2023 08:51 AM    LDLIPO 123 (H) 12/12/2023 08:51 AM    TRIGLYCERIDE 65 12/12/2023 08:51 AM     Cardiac Enzymes  No results for input(s): CPK, CKMB, CKMBINDX, TROPQUANT in the last 72 hours.  Lab Results   Component Value Date    TROPQUANT <0.01 09/16/2017    TROPQUANT <0.01 09/15/2017    TROPQUANT <0.01 09/15/2017     Lab Results   Component Value Date    BNP 149 (H) 09/13/2017    BNP 48 06/08/2017    BNP 128 (H) 01/04/2015    BNP 158 (H) 02/06/2013       The following results were reviewed with patient during the visit 01/30/2024:  Results  Labs   - LDL cholesterol: 12/12/2023, 123 mg/dL    Imaging   - Echocardiogram: 2018, Mild pulmonary hypertension    Diagnostic Testing   - Ankle-brachial index (ABI) test: 12/25/2023, Severe left leg artery disease and moderate right leg artery disease    Glendia DELENA Marina, MD, [facility]  [facility]  [address]  [address]  [place] [address]  [phone]  Dept: [phone]  Dept Fax: [phone]  01/30/2024, 9:45 AM

## 2024-02-22 NOTE — Progress Notes (Signed)
 [facility]   [address]  [place], [place] [address]   Phone: [phone]   Clinic Follow Up    Susan [name]   03-08-1951    PCP: Garnette Fisherman, MD      Chief Complaint: COPD, EMPHYSEMA, and PULMONARY NODULE      Assessment & Plan:  1. Chronic obstructive pulmonary disease with acute exacerbation (HCC)    2. On home O2 (2 l/m), reordered 10/22/2023    3. Centrilobular emphysema (HCC)    4. Lung nodules, stable      Assessment & Plan  1. COPD  She is currently taking Trelegy 200 mcg once daily and has reduced her use of albuterol significantly. She experiences occasional wheezing and congestion but no chest tightness or discomfort.   She quit smoking in 2022. Her low-dose lung cancer screening in January 2025 showed no suspicious nodules, and the existing nodules are stable.  She is advised to continue her current medication regimen.   -Oxygen use at night- Continue at 2lpm  Six-minute walk test indicates she does not need oxygen with exertion but lowest was 90%.  She is advised to purchase a pulse oximeter to monitor her oxygen levels at home. If there is a significant change in her condition, she should contact the office immediately.    Follow-up  The patient will follow up in 6 months.     Interval History:  Susan [name] is a 73 y.o. female who follows with our clinic for pulmonary emphysema, asthma, and lung nodules.    04/26/23 PFT   Mixed obstruction and restriction with severely reduced FEV1 and FVC.   Moderately reduced DLCO   History of Present Illness  She was initiated on Trelegy during her last visit, which she administers once daily. She reports an unpleasant taste associated with the medication but mitigates this by rinsing her mouth post-administration. She occasionally experiences difficulty in using the inhaler correctly. Her reliance on albuterol has significantly decreased, with only occasional use required. She experiences intermittent wheezing and  congestion, which she considers normal. She occasionally expectorates white phlegm but reports no hemoptysis. She also reports no chest tightness or discomfort.   She quit smoking in 2022. She requires oxygen therapy at night but has a portable oxygen tank available to use during the day.  She does not monitor her oxygen levels at home. She does not require refills of Trelegy at this time and has no issues with insurance coverage for the medication.    Supplemental Information  She fell and injured her foot. It is bruised, and she thinks it may be a torn ligament. She has an appointment scheduled for an MRI and will follow up with the podiatrist on 03/11/2024.      COPD Information:     CAT SCORING  I Cough: 3  I have phlegm (mucus) in my chest: 3  My chest feels tight: 0 (Never)  When I walk up a hill or one flight of stairs I am breathless: 5 (Always)  I am limited doing any activities at home: 5 (Always)  I am confident leaving my home despite my lung condition: 0 (Always)  I sleep soundly: 5 (Never)  I have lots of energy: 5 (Never)  TOTAL CAT SCORE: 26      Past Medical History:  Past Medical History:   Diagnosis Date   . Asthma    . Depression    . Hypertension    .  Leg pain    . Pneumonia--LLL 12/09/2012       Past Surgical History:  Past Surgical History:   Procedure Laterality Date   . HAMMER TOE REPAIR     . STENT INSERTION Bilateral     leg stents       Family History:  Family History   Problem Relation Age of Onset   . Hypertension Mother    . Heart murmur Mother    . Emphysema Mother    . Peripherial vascular disease Mother         bilat leg amputations   . Alzheimer's Mother    . Stomach Cancer Father    . Emphysema Brother    . No Known Problems Brother        Social History:  Social History     Socioeconomic History   . Marital status: Divorced     Spouse name: Not on file   . Number of children: Not on file   . Years of education: Not on file   . Highest education level: Not on file   Occupational  History   . Not on file   Tobacco Use   . Smoking status: Former     Average packs/day: 0.5 packs/day for 55.0 years (27.5 ttl pk-yrs)     Types: Cigarettes     Start date: 2023     Passive exposure: Past   . Smokeless tobacco: Never   Vaping Use   . Vaping status: Former   Substance and Sexual Activity   . Alcohol use: Not Currently     Alcohol/week: 3.3 standard drinks of alcohol     Types: 4 Shots of liquor per week   . Drug use: Not Currently     Comment: per pt, occasional cocaine use   . Sexual activity: Not on file   Other Topics Concern   . Caffeine Concerns Greater Than 3 Cups Per Day No   . Exercise No   . Energy Drinks No   . 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         Medications:    Outpatient Medications Marked as Taking for the 02/22/24 encounter (Office Visit) with Agapito Madelin PARAS, NP   Medication Sig Dispense Refill   . amLODIPine (NORVASC) 5 mg PO TABS Take 1 Tab by Mouth Once a Day.     SABRA atorvastatin (LIPITOR) 80 mg PO TABS Take 1 Tab by Mouth Every Night at Bedtime. 90 Tab 3   . cariprazine (VRAYLAR) 1.5 mg PO CAPS Take 1 Tab by Mouth Once a Day.     . carvediloL (COREG) 12.5 mg PO TABS  Take 1 Tab by Mouth 2 Times Daily with Meals.     . clopidogreL (PLAVIX) 75 mg PO TABS Take 1 Tab by Mouth Once a Day. 90 Tab 3   . fluticasone-umeclidin-vilanter (TRELEGY ELLIPTA) 200-62.5-25 mcg INH DsDv Take 1 Puff inhaled by mouth Once  a Day. Rinse mouth after use 60 Each 6   . furosemide  (LASIX ) 40 mg PO TABS Take 1 Tab by Mouth Once a Day.     . gabapentin (NEURONTIN) 300 mg PO CAPS Take 1 Cap by Mouth 3 Times Daily.     SABRA GEMTESA 75 mg PO TABS Once a Day.     SABRA ipratropium (ATROVENT) 0.02 % INH SOLN Take 2.5 mL inhaled by mouth Every 6 Hours As Needed for Wheezing (asthma). Use in nebulizer q4-6 hrs as directed. 150 mL 0   . Lacosamide 200 mg PO TABS Take 1 Tab by Mouth Twice Daily.     . mirabegron (MYRBETRIQ) 25 mg PO TB24 Take 1 Tab by Mouth Once a Day.     . pantoprazole (PROTONIX) 40 mg PO TBEC Take 1 Tab by Mouth Once a Day.     . potassium chloride ER (K-DUR;KLOR-CON M) 10 mEq PO tablet,extended release (part/cryst) Take 1 Tab by Mouth Once a Day.     SABRA QUEtiapine (SEROQUEL) 100 mg PO TABS Take 1 Tab by Mouth Every Night at Bedtime.     . valsartan (DIOVAN) 160 mg PO TABS Take 1 Tab by Mouth 2 (two) times a day.         Allergies:    Allergies   Allergen Reactions   . Penicillins unknown     Mother told her as a child she was allergic       Physical Examination:    BP 115/73 (Site: Arm Upper L, Position: Sitting, Cuff Size: Medium)   Pulse 76   Temp 97 F (36.1 C) (Temporal)   Wt 85.5 kg (188 lb 9.6 oz)   SpO2 92%   BMI 29.54 kg/m     Wt Readings from Last 4 Encounters:   02/22/24 85.5 kg (188 lb 9.6 oz)   02/11/24 86.6 kg (190 lb 14.4 oz)   02/01/24 86.5 kg (190 lb 9.6 oz)   01/30/24 86.2 kg (190 lb)       Constitutional: NAD, A&O x3, well-nourished, well-developed  Eyes: PERRL, sclera anicteric  HEENT: normocephalic, atraumatic, mucous membranes moist  Neck: Trachea midline  Cardiovascular: RRR, no M/R/G  Pulmonary: CTAB, no rales, no wheezing, normal chest rise and fall, no stridor  Abdominal: S/ND  Musculoskeletal: no clubbing, no cyanosis, nml muscle tone  Extremities:  No edema. Boot to right foot.   Skin: warm, dry, intact, no petechiae  Neurological: Speech, gait, stance intact, no focal deficits  Psychiatric: normal mood.  Appropriate affect    Physical Exam  Lungs are clear to auscultation.       Labs/Imaging/Studies:    Labs:    CBC w/Diff  Lab Results   Component Value Date/Time    WBC 7.6 01/17/2024 11:32 AM    RBC 3.63 (L) 01/17/2024 11:32 AM    HEMOGLOBIN 10.8 (L) 01/17/2024 11:32 AM    HCT 34.8 (L) 01/17/2024 11:32 AM    MCV 96 01/17/2024 11:32 AM    MCH 30 01/17/2024 11:32 AM    MCHC 31 01/17/2024 11:32 AM    RDW 13.8 01/17/2024 11:32 AM    PLATELET 294 01/17/2024 11:32 AM    MPV 10.4 01/17/2024  11:32 AM    SEGS 69 12/12/2023 08:55 AM    LYMPHOCYTES 20 12/12/2023 08:55 AM    MONOS 10 12/12/2023 08:55 AM    EOS 1 12/12/2023 08:55 AM    BASOS 0 12/12/2023 08:55 AM        Basic Metabolic Profile  Lab Results   Component Value Date    NA 137 01/17/2024    POTASSIUM 4.8 01/17/2024    CHLORIDE 97 (L) 01/17/2024    CO2 29 01/17/2024    BUN 21 01/17/2024    CREAT 1.1 01/17/2024    GLUCOSE 87 01/17/2024    CALCIUM 10.3 01/17/2024    MAGNESIUM 2.1 09/25/2016    PHOSPHORUS 3.1 09/25/2016       Lab Results   Component Value Date    BNP 149 (H) 09/13/2017       Hepatic Function  Lab Results   Component Value Date    ALBUMIN 4.1 12/12/2023    TOTPR 7.8 12/12/2023    BILID <0.2 10/03/2016    BILIT 0.3 12/12/2023    SGPTALT 9 12/12/2023    SGOTAST 15 12/12/2023    ALKPHOS 88 12/12/2023    LIPASE <20 10/03/2016           Radiology Results:  No results found for this or any previous visit (from the past 2160 hours).  Results  Imaging  Low dose lung cancer screening in January 2025 showed no suspicious nodules, a few less than 6 mm in size that have remained stable, and aortic valve with a little bit of calcium on it.    Testing  Six-minute walk test indicates no need for oxygen.         Pulmonary Function Testing:     FEV1FVC-Pre   Date Value Ref Range Status   04/26/2023 74 %    04/01/2015 73 %      FEV1-Pre   Date Value Ref Range Status   04/26/2023 0.75 L    04/01/2015 0.86 L      FEV1-%Pred-Pre   Date Value Ref Range Status   04/26/2023 36  %    04/01/2015 37 %      FEV1-Post   Date Value Ref Range Status   04/26/2023 0.89 L    04/01/2015 1.02 L      FEV1-%Change-Post   Date Value Ref Range Status   04/26/2023 6 %    04/01/2015 19 %      FVC-Pre   Date Value Ref Range Status   04/26/2023 1.01 L    04/01/2015 1.18 L      FVC-%Pred-Pre   Date Value Ref Range Status   04/26/2023 37 %    04/01/2015 40 %      MVV-Pre   Date Value Ref Range Status   04/26/2023 29 L/min    04/01/2015 27 L/min      MVV-%Pred-Pre   Date Value Ref Range Status   04/26/2023 32 %    04/01/2015 28 %      No results found for: TLCN2  TLC(Pleth)-%Pred-Pre   Date Value Ref Range Status   04/26/2023 80 %      DLCOCOR-Pre   Date Value Ref Range Status   04/26/2023 11.11 ml/min/mmHg    04/01/2015 6.13 ml/min/mmHg      DLCOCOR-Pred-Pre   Date Value Ref Range Status   04/26/2023 54 %    04/01/2015 21 %      DLVA-%Pred-Pre   Date Value Ref Range Status   04/26/2023  141 %    04/01/2015 65 %          Most Recent Echocardiogram:   Results for orders placed or performed during the hospital encounter of 09/13/17   Echo Cardiogram Complete   Result Value Ref Range    EF Echo 75     Narrative     ECHOCARDIOGRAPHIC REPORT     Exam Date: 2017-09-14 07:20 Gender: F Referring Physician: WILFRED MARKET   Name: JANELY, GULLICKSON DOB: [birth year] Sonographer: Morton Rued   CPI: [id number] BP: 98 / 55 Ht: 67 Wt: 180 BSA: 1.96     Type of Exam: ECHO CARDIOGRAM COMPLETE   Procedure: 2-D echocardiogram, Color flow analysis, Spectral Doppler analysis   ____________________________________________________________________________________________________   __       Impression    :   MILD CONCENTRIC LEFT VENTRICULAR HYPERTROPHY.   NORMAL LEFT VENTRICULAR CAVITY SIZE.   HYPERDYNAMIC LEFT VENTRICULAR GLOBAL WALL MOTION.   INCREASED LEFT VENTRICULAR EJECTION FRACTION OF 75%.   LEFT VENTRICULAR DIASTOLIC FUNCTION IS INDETERMINATE.   NORMAL RIGHT VENTRICULAR CAVITY SIZE.   NORMAL RIGHT VENTRICULAR GLOBAL SYSTOLIC  FUNCTION.   SYSTOLIC PULMONARY ARTERY PRESSURE IS ESTIMATED AT 48 MMHG.   INCREASED LEFT VENTRICULAR EJECTION FRACTION AND SYSTOLIC PULMONARY ARTERY PRESSURE   COMPARED TO THE PRIOR ECHO REPORT FROM 12-10-2012.     Clinical Indications: Chest pain   ICD Codes: Technical Quality: Adequate     MEASUREMENTS:   2D ECHO    RV Diameter                       3.2 cm                1.9-3.8   LV Diastolic Diameter Base LX     2.9 cm                3.6-5.4   LV Systolic Diameter Base LX      1.8 cm                2.3-4.0   IVS Diastolic Thickness           1.2 cm                0.6-1.1 cm   LVPW Diastolic Thickness          1.3 cm                0.6-1.1 cm   LA Systolic Diameter LX           2.3 cm                2.1-3.7 cm   Aortic Root Diameter              3.2 cm                2.0-3.5 cm   RA Systolic Pressure              8 mmHg   LA Systolic Pressure              16.2 mmHg   Ascending Aorta Diameter          3.3 cm     DOPPLER    LVOT Diameter                     2.1 cm   LVOT Area  3.5 cm   Mitral E Point Velocity           77.1 cm/s   Mitral  A Point Velocity          121 cm/s   Mitral E to A Ratio               0.64                  1.0 to 1.5   Mitral E to LV E' Septal Ratio    12.4   Mitral E to LV E' Lateral Ratio   11.4   Isovolumic Relaxation Time        79 ms                 70-90 ms   E Prime Velocity                  6.8 cm/s   TR Peak Velocity                  3.2 m/s   TR Peak Gradient                  40.1 mmHg   RV Systolic Pressure              48 mmHg       FINDINGS:     Left Ventricle: Normal left ventricular cavity size. Mild concentric left ventricular hypertrophy.   Left Ventricle Hyperdynamic left ventricular global wall motion.  Increased left ventricular ejection fraction   Function: (visual estimate of ejection fraction is reported). Left ventricular diastolic function is indeterminate.     LVEF: 75 %       Left Atrium: Normal left atrial size.  Unable to accurately measure  left atrial volume.   Right Ventricle: Normal right ventricular cavity size.  Normal right ventricular global systolic function. Tricuspid   Annular Plane Systolic Excursion (TAPSE) is 2.1 cm. Tricuspid Annular Plane Systolic Velocity   (TAPSV) is 17 cm/s.   Right Atrium: Normal inferior vena cava diameter.  Reduced collapsibility of the inferior vena cava.   Mitral Valve: Structurally normal mitral valve.  No mitral valve stenosis.  No mitral valve regurgitation.   Aortic Valve: Structurally normal aortic valve.  Mild calcification of the aortic valve cusps.  No aortic valve   stenosis.  Trivial aortic valve regurgitation.   Tricuspid Valve: Normally structured tricuspid valve.  No tricuspid valve stenosis.  Trace tricuspid valve   regurgitation.  Systolic pulmonary artery pressure is estimated at 48 mmHg.   Pulmonic Valve: Structurally normal pulmonic valve. No pulmonic valve stenosis or regurgitation.   Aorta: Normal aortic root diameter.   Pericardium: No pericardial effusion.   Masses / Shunts: No masses, shunts or thrombi seen.                     Francis VEAR Qualia M.D.   (Electronically Signed)   Final Date: 17 September 2017 08:19         Madelin JINNY Stai, NP  Pulmonary and Critical Care Medicine  Office [phone]  02/22/2024, 8:13 AM

## 2024-03-08 NOTE — Progress Notes (Signed)
 Mri right hindfoot done

## 2024-03-10 NOTE — Other (Signed)
 MRI without fracture.  Continue with the cast boot, follow-up as scheduled to discuss.    AC

## 2024-03-11 NOTE — Progress Notes (Signed)
 [facility]  [name], FACFAS                    [address]                            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  She voices an understanding of the problem.  The right hindfoot MRI was reviewed today, there are some stress-related changes in the hindfoot without appreciable fracture or acute injury.  Pain is better today.  [name] will progress out of the postop shoe into a good cushioned walking shoe.  Encouraged use of compression stockings, we did also dispense more Tubigrip's today.  She will continue weightbearing with a walker as tolerated.  Will f/u as needed.    Subjective:     Susan [name] presents today with a chief complaint of right foot MRI follow-up.  She was sent for an MRI which was completed on June 7.  This was intended to rule out any subtle stress fracture or acute injury.  She has tried a Furniture conservator/restorer recently.  Her pain is much better today.  She is primarily concerned about foot and leg swelling.                    Past Medical History:   Diagnosis Date   . Asthma    . Depression    . Hypertension    . Leg pain    . Pneumonia--LLL 12/09/2012              Past Surgical History:   Procedure Laterality Date   . HAMMER TOE REPAIR     . STENT INSERTION Bilateral     leg stents              No outpatient medications have been marked as taking for the 03/11/24 encounter (Office Visit) with Cavallo, Anthony, DPM.              Allergies   Allergen Reactions   .  Penicillins unknown     Mother told her as a child she was allergic              Family History   Problem Relation Age of Onset   . Hypertension Mother    . Heart murmur Mother    . Emphysema Mother    . Peripherial vascular disease Mother  bilat leg amputations   . Alzheimer's Mother    . Stomach Cancer Father    . Emphysema Brother    . No Known Problems Brother               Social History     Socioeconomic History   . Marital status: Divorced     Spouse name: Not on file   . Number of children: Not on file   . Years of education: Not on file   . Highest education level: Not on file   Occupational History   . Not on file   Tobacco Use   . Smoking status: Former     Average packs/day: 0.5 packs/day for 55.0 years (27.5 ttl pk-yrs)     Types: Cigarettes     Start date: 2023     Passive exposure: Past   . Smokeless tobacco: Never   Vaping Use   . Vaping status: Former   Substance and Sexual Activity   . Alcohol use: Not Currently     Alcohol/week: 3.3 standard drinks of alcohol     Types: 4 Shots of liquor per week   . Drug use: Not Currently     Comment: per pt, occasional cocaine use   . Sexual activity: Not on file   Other Topics Concern   . Caffeine Concerns Greater Than 3 Cups Per Day No   . Exercise No   . Energy Drinks No   . 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 Former     The patient's medical history, medicines, and social history (including tobacco  usage) were reviewed and updated as appropriate.    Objective:     PHYSICAL EXAMINATION     REVIEW OF SYSTEMS: Negative as documented below as well as positive findings in bold.           Constitutional  Respiratory  Gastrointestinal  Skin   - Fever - Cough - Heartburn - Rash   - Chills - Spit blood - Nausea - Itching   - Weight Loss - Shortness of breath - Vomiting - Nail pain   - Malaise/Fatigue - Wheezing - Abdominal Pain     - Weight Gain   - Blood in Stool         - Diarrhea          Cardiovascular  Genitourinary  Neurological  HEENT   - Chest Pain - Dysuria - Dizziness - Headache   - Palpitations - Hematuria - Tingling - Congestion   - Pain at night in legs - Flank Pain - Tremor - Sore Throat   - Cramping   - Sensory Change - Blurred Vision   - Leg Swelling   -  Speech Change - Double Vision   - Dizzy when standing   - Focal Weakness - Eye Redness       - Seizures - Dry Eyes       - Loss of Consciousness          Endocrine  Musculoskeletal  Psychiatric   - Cold intolerance - Muscle Pain - Depression   - Heat intolerance - Neck Pain - Insomnia   - Anemia - Joint Pain - Memory Loss   -  Easy bruising, bleeding - Heel pain - Anxiety               Vitals:  Wt 85.5 kg (188 lb 7.9 oz)   BMI 29.52 kg/m  BMI:29.9    General Appearance: Patient is appropriate for development, nutrition, attention to grooming, and body habitus.    Psychiatric:   Patient is well oriented to time, place, and person.  Patients judgment and insight is appropriate.  Patient is appropriate for recent and remote memory.    Derm:    Skin is intact with no ulceration, hyperkeratosis or fissuring.    Diffusely hyperpigmented right medial ankle and lower leg.    Vascular Exam:  Dorsalis Pedis Pulse: 2/4 B/L.  Posterior Tibial Pulse: 2/4 B/L.   Capillary Refill WNL to all digits.  Temperature of extremites from proximal to distal is warm and perfused.  There is +1 edema to the right lower extremity, unchanged.  Pedal Hair is present  B/L.    Neurological Exam:  Sensation to light touch and temperature discrimination is intact bilateral.  No allodynia noted.  Bilateral feet elicited negative Tibial Tinel and Valleix signs.    Orthopedic:  There is diminished ROM at the ankle and at the STJ.  Pes planus foot architecture, nonfixed.  Much improved tenderness to the right hindfoot on exam.  Presents in a postop shoe today using a walker.      Radiographs:   03/08/2024 Right Hindfoot MRI:      Impression    --------------  1. No fractures. Mild multifocal osseous edema may represent findings of altered weightbearing mechanics/stress related changes. There is associated mild intrinsic foot muscular atrophy and intramuscular edema which could represent denervation changes.   2. Subcutaneous edema along the dorsum of the foot extending to the ankle is nonspecific, but most commonly represents venous stasis. Correlate clinically to exclude infectious cellulitis or soft tissue contusion.     Signed By: Dorn JONETTA Crick, DO on 03/10/2024 7:51 AM     Narrative   MR HINDFOOT WO IV CON RIGHT       INDICATION: S90.30XA: Contusion of dorsum of foot   R60.0: Edema of right lower extremity   M79.671: Pain in right foot.       TECHNIQUE: Multiplanar multisequential MRI of the ankle without  IV contrast       COMPARISONS: Right foot radiographs Feb 11, 2024       FINDINGS:   -----------       ANKLE LIGAMENTS:   Syndesmotic ligaments: Intact   Anterior talofibular ligament: Portions of the ATFL appear significantly attenuated and likely represent changes of remote sprain/tearing. No surrounding edema.   Posterior talofibular ligament: Intact   Calcaneofibular ligament: Thickened, otherwise intact.   Deltoid: Intact   Spring ligament complex: Not well visualized on this exam. No significant edema to suggest acute injury. This likely represents changes of chronic degeneration and likely a component of volume averaging.SABRA  ANKLE AND FOOT TENDONS:   Medial flexor  tendons: Medial flexor tendons remain intact without evidence of tearing or tenosynovitis. There is a skin marker placed at the medial ankle overlying the distal tendons. There is no focal edema or abnormality in this location.   Peroneal tendons: Intact.   Extensor tendons: Intact.   Achilles tendon: Intact.       OSSEOUS:   No fractures. There is very mild osseous edema of the first and second TMT articulations, medial talus and lateral cuboid.. No suspicious osseous lesions. Mild patchy hyperintense cortical changes of the subtalar and midfoot articulations can be seen in bone demineralization or disuse osteopenia. No suspicious osseous lesions.           OTHER:   Sinus tarsi: Normal fat signal intensity.   Plantar fascia: Intact, normal in caliber and signal intensity.   Tarsal tunnel: Normal in appearance.   Regional soft tissues: Diffuse mild muscular atrophy and associated mild intramuscular edema. No overt signs of acute muscular injury. There is suggested moderate atrophy of the digiti minimi.       Moderate nonspecific dorsal foot subcutaneous edema, extends to the ankle region. No hematoma.         Dictated by: BULZAN, JONATHAN on Mon Mar 10, 2024  7:31:19 AM EDT          Karna Benders  02/11/2024    3 views of the right foot were taken and results reviewed with the Pt.    Interpretation: Negative for acute fracture or dislocation.  Pes planus foot architecture.  No significant degenerative changes seen.    Impression: Negative for acute fracture or dislocation right foot.    Note: This was dictated using a computer transcription program. Although proofread, it may contain computer transcription errors and phonetic errors. Other human proofreading errors may also exist. Corrections may be performed at a later time. Please contact us  for any clarification if needed.    Curtistine Comp,  DPM  [facility]

## 2024-04-08 ENCOUNTER — Emergency Department: Admit: 2024-04-08 | Payer: Medicare (Managed Care) | Primary: Family Medicine

## 2024-04-08 ENCOUNTER — Inpatient Hospital Stay
Admit: 2024-04-08 | Discharge: 2024-04-08 | Disposition: A | Discharge status: Home / Self Care | Payer: Medicare (Managed Care) | Arrived: VH | Attending: Emergency Medicine

## 2024-04-08 DIAGNOSIS — R600 Localized edema: Principal | ICD-10-CM

## 2024-04-08 LAB — EKG 12-LEAD
Atrial Rate: 75 {beats}/min
Calculated P Axis: 84 degrees
Calculated R Axis: 34 degrees
Calculated T Axis: 68 degrees
DIAGNOSIS, 93000: NORMAL
P-R Interval: 180 ms
Q-T Interval: 364 ms
QRS Duration: 72 ms
QTC Calculation (Bezet): 406 ms
Ventricular Rate: 75 {beats}/min

## 2024-04-08 LAB — COMPREHENSIVE METABOLIC PANEL
ALT: 18 U/L (ref 10–49)
AST: 27 U/L (ref 0.0–33.9)
Albumin: 2.9 g/dL — ABNORMAL LOW (ref 3.4–5.0)
Alkaline Phosphatase: 76 U/L (ref 46–116)
Anion Gap: 9 mmol/L (ref 5–15)
BUN: 25 mg/dL — ABNORMAL HIGH (ref 9–23)
CO2: 32 meq/L — ABNORMAL HIGH (ref 20–31)
Calcium: 10 mg/dL (ref 8.7–10.4)
Chloride: 97 meq/L — ABNORMAL LOW (ref 98–107)
Creatinine: 1.31 mg/dL — ABNORMAL HIGH (ref 0.55–1.02)
GFR African American: 51
GFR Non-African American: 43
Glucose: 131 mg/dL — ABNORMAL HIGH (ref 74–106)
Potassium: 4.2 meq/L (ref 3.5–5.1)
Sodium: 137 meq/L (ref 136–145)
Total Bilirubin: 0.5 mg/dL (ref 0.30–1.20)
Total Protein: 8.9 g/dL — ABNORMAL HIGH (ref 5.7–8.2)

## 2024-04-08 LAB — CBC WITH AUTO DIFFERENTIAL
Basophils: 0.4 % (ref 0–3)
Eosinophils: 0.5 % (ref 0–5)
Hematocrit: 35.5 % (ref 35.0–47.0)
Hemoglobin: 10.9 g/dL — ABNORMAL LOW (ref 11.0–16.0)
Immature Granulocytes %: 0.5 % (ref 0.0–3.0)
Lymphocytes: 10.6 % — ABNORMAL LOW (ref 28–48)
MCH: 29.4 pg (ref 25.4–34.6)
MCHC: 30.7 g/dL (ref 30.0–36.0)
MCV: 95.7 fL (ref 80.0–98.0)
MPV: 9.6 fL (ref 6.0–10.0)
Monocytes: 8 % (ref 1–13)
Neutrophils Segmented: 80 % — ABNORMAL HIGH (ref 34–64)
Nucleated RBCs: 0 (ref 0–0)
Platelets: 432 1000/mm3 (ref 140–450)
RBC: 3.71 M/uL (ref 3.60–5.20)
RDW: 51.9 — ABNORMAL HIGH (ref 36.4–46.3)
WBC: 11.1 1000/mm3 — ABNORMAL HIGH (ref 4.0–11.0)

## 2024-04-08 LAB — TROPONIN
Troponin, High Sensitivity: 5 ng/L (ref 0–34)
Troponin, High Sensitivity: 4 ng/L (ref 0–34)

## 2024-04-08 LAB — PROBNP, N-TERMINAL: NT Pro-BNP: 144 pg/mL — ABNORMAL HIGH (ref 0.0–125.0)

## 2024-04-08 MED ORDER — FUROSEMIDE 10 MG/ML IJ SOLN
10 | Freq: Once | INTRAMUSCULAR | Status: AC
Start: 2024-04-08 — End: 2024-04-08
  Administered 2024-04-08: 20:00:00 20 mg via INTRAVENOUS

## 2024-04-08 MED ORDER — ACETAMINOPHEN 500 MG PO TABS
500 | ORAL | Status: AC
Start: 2024-04-08 — End: 2024-04-08
  Administered 2024-04-08: 20:00:00 1000 mg via ORAL

## 2024-04-08 MED FILL — ACETAMINOPHEN EXTRA STRENGTH 500 MG PO TABS: 500 mg | ORAL | Qty: 2

## 2024-04-08 MED FILL — FUROSEMIDE 10 MG/ML IJ SOLN: 10 mg/mL | INTRAMUSCULAR | Qty: 2

## 2024-04-08 NOTE — ED Triage Notes (Signed)
 In wheelchair d/t inability to ambulate sine June  Worsening BLE swelling & pain x3 months  Has COPD, emphysema, & asthma per daughter who accompanies pt  Usually on 2L NC @ home while active but is satting well in triage so deferring O2 for time being d/t possibility of CO2 retention

## 2024-04-08 NOTE — Progress Notes (Signed)
 Attempted to bed patient in a hallway bed but daughter and pt refused. Pt brought back out to waiting room.

## 2024-04-08 NOTE — Discharge Instructions (Signed)
 Return to the emergency department if you have any worsening pain in your feet and is uncontrolled, any changes to the coloration of your skin, fevers or chills, or if you have any new questions or concerns.  Please follow-up in the emergency department if you have experience any chest pain, shortness of breath, intractable nausea or vomiting, abdominal or pelvic or back pain, headaches.

## 2024-04-08 NOTE — ED Notes (Signed)
 Dom EDT, IV attempt x1, unsuccessful.   Labs obtained and sent.   EKG completed, Liddie, MD     Rosan Au [facility]  04/08/24 1016

## 2024-04-08 NOTE — ED Provider Notes (Signed)
 [facility]  Emergency Department       Patient: Susan [name] Age: 73 y.o. Sex: female    Date of Birth: [birth year] Admit Date: 04/08/2024 PCP: Olympia Garnette RAMAN, MD   MRN: [id number]  CSN: [id number]     Room: ERWT/WT Time Dictated: 1:17 PM      Chief Complaint   Chief Complaint   Patient presents with    Leg Swelling     BLE    Leg Pain     BLE       History of Present Illness   73 y.o. female bilateral feet swelling and pain.  This has been going on for the last several months.  Patient states that it seems to have worsened over the past few weeks.  She has been evaluated by her primary care, cardiology, vascular and podiatry for this complaint.  She denies any fevers or chills.  Denies having any abdominal pain or pelvic pain.  He denies having any urinary complaints or any blood in her urine.  She denies having any diarrhea.  She denies having any shortness of breath or chest pain.  She denies any history of heart failure or MIs.    Review of Systems   Review of Systems   All other systems reviewed and are negative.     As outlined in HPI    Past Medical/Surgical History   No past medical history on file.  No past surgical history on file.    Social History     Social History     Socioeconomic History    Marital status: Legally Separated     Spouse name: Not on file    Number of children: Not on file    Years of education: Not on file    Highest education level: Not on file   Occupational History    Not on file   Tobacco Use    Smoking status: Not on file    Smokeless tobacco: Not on file   Substance and Sexual Activity    Alcohol use: Not on file    Drug use: Not on file    Sexual activity: Not on file   Other Topics Concern    Not on file   Social History Narrative    Not on file     Social Drivers of Health     Financial Resource Strain: Not on file   Food Insecurity: Not on file   Transportation Needs: Not on file   Physical Activity: Not on file   Stress: Not on file   Social Connections: Not  on file   Intimate Partner Violence: Not on file   Housing Stability: Not on file       Family History   No family history on file.    Current Medications     No current facility-administered medications for this encounter.     No current outpatient medications on file.       Allergies     Allergies   Allergen Reactions    Penicillins Other (See Comments)     States unknown reaction     Mother told her as a child she was allergic       Physical Exam     ED Triage Vitals   BP Systolic BP Percentile Diastolic BP Percentile Temp Temp Source Pulse Respirations SpO2   04/08/24 0945 -- -- 04/08/24 0945 04/08/24 0945 04/08/24 0945 04/08/24 0945 04/08/24 0945   (!) 01/50  97.7 F (36.5 C) Oral 77 16 97 %      Height Weight - Scale         04/08/24 1003 04/08/24 1003         1.702 m (5' 7) 81.6 kg (180 lb)            Physical Exam  Vitals and nursing note reviewed.   Constitutional:       General: She is not in acute distress.     Appearance: Normal appearance. She is obese. She is not ill-appearing or toxic-appearing.   HENT:      Head: Normocephalic and atraumatic.      Mouth/Throat:      Mouth: Mucous membranes are moist.      Pharynx: Oropharynx is clear.   Eyes:      General: No scleral icterus.     Extraocular Movements: Extraocular movements intact.      Conjunctiva/sclera: Conjunctivae normal.      Pupils: Pupils are equal, round, and reactive to light.   Cardiovascular:      Rate and Rhythm: Normal rate and regular rhythm.      Pulses: Normal pulses.      Heart sounds: Normal heart sounds.   Pulmonary:      Effort: Pulmonary effort is normal. No respiratory distress.      Breath sounds: Normal breath sounds. No stridor. No wheezing, rhonchi or rales.   Abdominal:      General: Abdomen is flat. There is no distension.      Palpations: Abdomen is soft.      Tenderness: There is no abdominal tenderness. There is no right CVA tenderness, left CVA tenderness, guarding or rebound.   Musculoskeletal:      Right lower  leg: Edema present.      Left lower leg: Edema present.      Comments: Swelling is nonpitting in nature and goes up to the ankles.  She appears to have tenderness bilateral feet.  No overlying skin changes.   Skin:     General: Skin is warm and dry.      Coloration: Skin is not jaundiced.   Neurological:      General: No focal deficit present.      Mental Status: She is alert and oriented to person, place, and time. Mental status is at baseline.           Impression and Management Plan     Differential diagnoses cirrhosis, CHF, acute renal failure DVT, osteomyelitis, osteoporosis/stress fractures.    Diagnostic Studies   Lab:   Results for orders placed or performed during the hospital encounter of 04/08/24   XR CHEST (2 VW)    Narrative    History: Shortness of breath    Comparison: None    Technique: PA and lateral radiographs of the chest were obtained. Two views, 2  exposures total.     Findings:  The lungs and pleural spaces are clear. Normal cardiomediastinal silhouette.  Degenerative changes of the thoracic spine.      Impression    IMPRESSION:  No acute cardiopulmonary disease.    Electronically signed by: Norleen Gleason 04/08/2024 11:20 AM EDT            Workstation ID: [id number]     CBC with Auto Differential   Result Value Ref Range    WBC 11.1 (H) 4.0 - 11.0 1000/mm3    RBC 3.71 3.60 - 5.20 M/uL    Hemoglobin 10.9 (L) 11.0 -  16.0 gm/dl    Hematocrit 64.4 64.9 - 47.0 %    MCV 95.7 80.0 - 98.0 fL    MCH 29.4 25.4 - 34.6 pg    MCHC 30.7 30.0 - 36.0 gm/dl    Platelets 567 859 - 450 1000/mm3    MPV 9.6 6.0 - 10.0 fL    RDW 51.9 (H) 36.4 - 46.3      Nucleated RBCs 0 0 - 0      Immature Granulocytes % 0.5 0.0 - 3.0 %    Neutrophils Segmented 80.0 (H) 34 - 64 %    Lymphocytes 10.6 (L) 28 - 48 %    Monocytes 8.0 1 - 13 %    Eosinophils 0.5 0 - 5 %    Basophils 0.4 0 - 3 %   CMP   Result Value Ref Range    Potassium 4.2 3.5 - 5.1 mEq/L    Chloride 97 (L) 98 - 107 mEq/L    Sodium 137 136 - 145 mEq/L    CO2 32 (H) 20  - 31 mEq/L    Glucose 131 (H) 74 - 106 mg/dl    BUN 25 (H) 9 - 23 mg/dl    Creatinine 8.68 (H) 0.55 - 1.02 mg/dl    GFR African American 51.0      GFR Non-African American 43      Calcium 10.0 8.7 - 10.4 mg/dl    Anion Gap 9 5 - 15 mmol/L    AST 27.0 0.0 - 33.9 U/L    ALT 18 10 - 49 U/L    Alkaline Phosphatase 76 46 - 116 U/L    Total Bilirubin 0.50 0.30 - 1.20 mg/dl    Total Protein 8.9 (H) 5.7 - 8.2 gm/dl    Albumin 2.9 (L) 3.4 - 5.0 gm/dl   proBNP, N-TERMINAL   Result Value Ref Range    NT Pro-BNP 144.0 (H) 0.0 - 125.0 pg/ml   Troponin   Result Value Ref Range    Troponin, High Sensitivity 5 0 - 34 ng/L   Troponin   Result Value Ref Range    Troponin, High Sensitivity 4 0 - 34 ng/L        Medications - No data to display     My interpretation of the laboratory studies is listed below and ED course    EKG as interpreted by me: None.      Medical Decision Making/ED Course     ED Course as of 04/08/24 1551   Tue Apr 08, 2024   1513 Primary complaint is bilateral swelling of the feet.  She is also complaining of bilateral feet pain.  She does not have any orthopnea, wheezing or crackles or any dyspnea.  She is not tachypneic and does not have any increased work of breathing.  Previous records reviewed.  She appears to have had an extensive workup for this complaint and has already seen cardiology and podiatry and vascular.  She was noted to have stress fractures of her right heel.  She previously had a PVL in March which was negative for DVTs.  I am unclear as to why she has this complaint today.  Will workup for organ function evaluate for any evidence of acute liver failure, renal failure or CHF.  Will also try to rule out any clots with bilateral PVLs. [AS]   1515 Labs reviewed.  She has a mild leukocytosis of 11.  She does not have any platelet count abnormalities.  Her anemia  is at baseline.  Her electrolytes are unremarkable.  Her creatinine and GFR are at her baseline.  No concerns for AKI.  Her troponin is  negative.  Her proBNP is mildly elevated.  She has no findings consistent with CHF exacerbation such as crackles rhonchorous breath sounds or orthopnea to suggest this. [AS]   1516 Her liver function panel is unremarkable.  She does not appear to have any synthetic dysfunction given that she has a normal platelet count.  She has chronic kidney disease but I doubt that this is contributing to her symptoms bilateral swelling and pain of the feet. [AS]   1542 No evidence of DVTs noted on bilateral PVL.  I have ordered 20 mg IV Lasix  for her swelling.  This is the equivalent of her home dose of 40.  Her amlodipine may be contributing to her bilateral feet swelling.  She may need to discontinue this medication entirely.  Recommend that she follow-up with her primary care physician to discuss status and further causes of bilateral feet swelling. She should be stable for discharge at this point.  Return precaution discussed and all questions answered. [AS]      ED Course User Index  [AS] Maree English B, DO       Procedures      EXTERNAL RECORDS & PREVIOUS RESULTS REVIEWED:  I reviewed the patient's previous records here at [facility] and available outside facilities and note that she has moderate peripheral artery, stress fracture of the right calcaneus, previous PVL lower extremities which are negative for DVTs.    INDEPENDENT HISTORIAN:  History and/or plan development assisted by: Family Member    Severe exacerbation or progression of chronic illness: None    CONDITION POSING Threat to body FUNCTION, LIFE, OR LIMB without evaluation and management:  Injury to/Impaired function of the Musculoskeletal system    SOCIAL DETERMINANTS  impacting the Medical Decision Making: stress,   none which makes follow-up problematic or uncertain.    Comorbidities impacting Evaluation and Management: COPD    Critical Care Time (if necessary) none    NARRATIVE: see ED course above    I considered the following testing, treatment, or disposition:    Observation  but decided not to pursue due to Condition of concern ruled out    Final Diagnosis       ICD-10-CM    1. Leg edema  R60.0       2. Pain in both feet  M79.671     M79.672             Disposition   Discharge home    English KATHEE Maree, DO   Emergency Medicine PGY-3  [facility]  April 08, 2024  1:17 PM     Patient seen with Attending, Dr. Arcadio    Portions of this electronic record were dictated using Dragon voice recognition software.  Unintended errors in translation may occur.                     Maree English KATHEE, DO  Resident  04/08/24 1552       Maree English KATHEE, DO  Resident  04/08/24 [phone]

## 2024-04-08 NOTE — Progress Notes (Signed)
 Peripheral Vascular Lab Duplex : Bilateral Lower Extremity Venous Duplex     1. No evidence of deep vein thrombosis noted in the bilateral lower extremities.     Final report to follow  [facility], RVT

## 2024-04-14 ENCOUNTER — Inpatient Hospital Stay
Admission: EM | Admit: 2024-04-14 | Discharge: 2024-05-02 | Disposition: A | Discharge status: Rehabilitation Facility | Payer: Medicare (Managed Care) | Admitting: Internal Medicine

## 2024-04-14 DIAGNOSIS — L02611 Cutaneous abscess of right foot: Principal | ICD-10-CM

## 2024-04-14 DIAGNOSIS — I70234 Atherosclerosis of native arteries of right leg with ulceration of heel and midfoot: Principal | ICD-10-CM

## 2024-04-14 NOTE — ED Notes (Signed)
 Bedside Ultrasound    Date/Time: 04/15/2024 1:02 AM    Performed by: Jacklyn Lynwood POUR, MD  Authorized by: Jacklyn Lynwood POUR, MD    Verbal consent obtained: Yes    Given by:  Patient  Performed by:  Attending  Type of procedure:  Focused soft tissue  Left leg:     Indications:  Swelling and pain    Skin and subcutaneous tissue:  Adequate    Cobblestoning:  Increased    Subcutaneous Collection:  Present    Interpretation:  Abscess and cellulitis  Comments:      Right heel    Lynwood POUR Jacklyn, MD       Jacklyn Lynwood POUR, MD  04/15/24 [phone]

## 2024-04-14 NOTE — ED Provider Notes (Addendum)
 [facility]  Emergency Department Treatment Report        Patient: Susan [name] Age: 73 y.o. Sex: female    Date of Birth: 11-19-1950 Admit Date: 04/14/2024 PCP: Olympia Garnette RAMAN, MD   MRN: [id number]  CSN: [id number]  Attending: Dr. Jacklyn Agent   Room: ER39/ER39 Time Dictated: 2:11 AM PA: Feliciano     Dr. Jacklyn Agent, MD    I hereby certify this patient for admission based upon medical necessity as noted below:    Chief Complaint   Chief Complaint   Patient presents with    Foot Swelling    Fatigue       History of Present Illness   This is a 73 y.o. female history of hypertension, COPD on home oxygen peripheral neuropathy complaining of bilateral leg pain worsening over several weeks states she was seen on 8 July and discharged home but over the last 2 days pain has become worse and she noticed swelling to the right lateral leg today.  She states and thus not feeling well overall.  But denies vomiting diarrhea denies abdominal pain denies shortness of breath denies fever denies chills states is very painful to walk    Review of Systems   Review of Systems   Constitutional:  Negative for fever.   Respiratory:  Negative for shortness of breath.    Cardiovascular:  Negative for chest pain.   Gastrointestinal:  Negative for abdominal pain.   Musculoskeletal:  Negative for back pain.   All other systems reviewed and are negative.        Past Medical/Surgical History   No past medical history on file.  No past surgical history on file.    Social History     Social History     Socioeconomic History    Marital status: Legally Separated     Spouse name: Not on file    Number of children: Not on file    Years of education: Not on file    Highest education level: Not on file   Occupational History    Not on file   Tobacco Use    Smoking status: Not on file    Smokeless tobacco: Not on file   Substance and Sexual Activity    Alcohol use: Not on file    Drug use: Not on file    Sexual activity: Not on file   Other  Topics Concern    Not on file   Social History Narrative    Not on file     Social Drivers of Health     Financial Resource Strain: Not on file   Food Insecurity: Not on file   Transportation Needs: Not on file   Physical Activity: Not on file   Stress: Not on file   Social Connections: Not on file   Intimate Partner Violence: Not on file   Housing Stability: Not on file       Family History   No family history on file.    Current Medications     None       Allergies     Allergies   Allergen Reactions    Penicillins Other (See Comments)     States unknown reaction     Mother told her as a child she was allergic       Physical Exam   Patient Vitals for the past 24 hrs:   Temp Pulse Resp BP SpO2   04/15/24 0145 --  76 19 -- 100 %   04/15/24 0131 -- 75 22 (!) 127/50 100 %   04/15/24 0101 -- 78 15 132/65 99 %   04/15/24 0059 -- 72 14 -- 100 %   04/15/24 0034 -- 80 22 (!) 113/59 97 %   04/15/24 0029 -- 80 14 -- 98 %   04/15/24 0015 -- 78 13 -- 100 %   04/14/24 2320 -- 82 16 -- 100 %   04/14/24 2300 -- 80 15 101/67 100 %   04/14/24 2230 -- 82 -- 103/60 100 %   04/14/24 2213 -- 83 16 -- 95 %   04/14/24 2103 -- 78 18 (!) 114/47 99 %   04/14/24 2034 -- 74 22 110/63 100 %   04/14/24 2004 -- 71 21 108/61 100 %   04/14/24 1934 -- 79 14 120/66 99 %   04/14/24 1927 97.9 F (36.6 C) 80 16 (!) 110/57 98 %     Physical Exam  Vitals and nursing note reviewed.   Constitutional:       General: She is in acute distress.      Appearance: Normal appearance.   HENT:      Head: Normocephalic and atraumatic.      Right Ear: External ear normal.      Left Ear: External ear normal.      Nose: Nose normal.      Mouth/Throat:      Mouth: Mucous membranes are moist.   Eyes:      Extraocular Movements: Extraocular movements intact.      Pupils: Pupils are equal, round, and reactive to light.   Cardiovascular:      Rate and Rhythm: Normal rate and regular rhythm.      Pulses: Normal pulses.      Heart sounds: Normal heart sounds.   Pulmonary:       Effort: Pulmonary effort is normal.      Breath sounds: Normal breath sounds.   Musculoskeletal:      Cervical back: Normal range of motion.      Comments: Bilateral lower leg tender to palpation dorsalis pedis pulse present appropriate on the lateral aspect of the right heel there is a 6 x 7 cm swollen warm fluctuant mass with surrounding darkened warm skin.  No joint involvement no open wound   Skin:     General: Skin is warm.   Neurological:      General: No focal deficit present.      Mental Status: She is alert.   Psychiatric:         Mood and Affect: Mood normal.          Impression and Management Plan   Patient with history of COPD, hypertension, peripheral neuropathy brought in by EMS for not feeling well for the last couple days and complaint of bilateral feet swelling and today she noted the right foot swelling became significantly worse.  On 8 July her WBC was 11 today it is 19.  Labs reviewed and interpreted by me.  Procalcitonin 0.5 lactic 0.9 H&H 9.8/31.4 consistent with previous labs BUN 34 creatinine 1.4 consistent with previous labs.  No chest pain no shortness of breath she is on home oxygen.  She is not septic but may meet SIRS criteria.  Right foot abscess drained in ED.  May require podiatry evaluation.  Podiatrist added to the team.  Started antibiotics in the ED.  Patient stable for admission.      Incision/Drainage  Date/Time: 04/15/2024 12:58 AM    Performed by: Feliciano Janas FERNS, PA  Authorized by: Jacklyn Lynwood POUR, MD    Consent:     Consent obtained:  Verbal    Consent given by:  Patient    Risks discussed:  Bleeding, incomplete drainage, pain and infection    Alternatives discussed:  Delayed treatment  Universal protocol:     Procedure explained and questions answered to patient or proxy's satisfaction: yes      Test results available : yes      Imaging studies available: yes      Site/side marked: yes      Patient identity confirmed:  Verbally with patient and arm band  Location:     Type:   Abscess    Location:  Lower extremity    Lower extremity location:  Foot    Foot location:  R foot  Pre-procedure details:     Skin preparation:  Povidone-iodine  Sedation:     Sedation type:  None  Anesthesia:     Anesthesia method:  Local infiltration    Local anesthetic:  Lidocaine 1% WITH epi  Procedure type:     Complexity:  Complex  Procedure details:     Incision types:  Single straight    Wound management:  Probed and deloculated    Drainage:  Purulent    Drainage amount:  Copious    Packing materials:  1/2 in iodoform gauze  Post-procedure details:     Procedure completion:  Tolerated    Is this patient to be included in the SEP-1 core measure? Yes SEP-1 CORE MEASURE DATA      Sepsis Criteria   Severe Sepsis Criteria   Septic Shock Criteria       Must meet 2:    [] Temp >100.9 F (38.3 C) or < 96.8 F (36 C)  [] HR > 90  [] RR > 20  [x] WBC > 12 or < 4 or 10% bands    AND:    [x]  Infection Confirmed or Suspected.     Must meet 1:    [] Lactate > 2       or   [] Signs of Organ Dysfunction:    - SBP < 90 or MAP < 65  -Creatinine > 2 or increased from baseline  -Urine Output < 0.5 ml/kg/hr  -Bilirubin > 2  -INR > 1.5 (not anticoagulated)  -Platelets < 100,000  -Acute Respiratory Failure as evidenced by new need for NIPPV or mechanical ventilation   Must meet 1:    [] Lactate > 4        or   [] SBP < 90 or MAP < 65 for at least two readings in the first hour after fluid bolus administration    [] Vasopressors initiated (if hypotension persists after fluid resuscitation)   Patient Vitals for the past 6 hrs:   BP Pulse Resp SpO2   04/14/24 2034 110/63 74 22 100 %   04/14/24 2103 (!) 114/47 78 18 99 %   04/14/24 2213 -- 83 16 95 %   04/14/24 2230 103/60 82 -- 100 %   04/14/24 2300 101/67 80 15 100 %   04/14/24 2320 -- 82 16 100 %   04/15/24 0015 -- 78 13 100 %   04/15/24 0029 -- 80 14 98 %   04/15/24 0034 (!) 113/59 80 22 97 %   04/15/24 0059 -- 72 14 100 %   04/15/24 0101 132/65 78 15 99 %   04/15/24 0131 (!)  127/50 75 22  100 %   04/15/24 0145 -- 76 19 100 %      Recent Labs     04/14/24  1956   WBC 19.2*   CREATININE 1.43*   BILITOT 0.40   PLT 354          2:11 AM  Patient WBC is 19. Lactic pending    Not septic    Fluid Resuscitation Rationale: less than 77ml/kg because not septic. Instead, 1 L was ordered.    Repeat lactate level: not indicated due to initial lactate < 2    Reassessment Exam: Not applicable. Patient does not have septic shock.      Diagnostic Studies     LABS/Imaging:      Results for orders placed or performed during the hospital encounter of 04/14/24   Incision/Drainage    Narrative    Feliciano Gains I, [place]     04/15/2024  2:10 AM  Incision/Drainage    Date/Time: 04/15/2024 12:58 AM    Performed by: Feliciano Gains FERNS, PA  Authorized by: Jacklyn Lynwood POUR, MD    Consent:     Consent obtained:  Verbal    Consent given by:  Patient    Risks discussed:  Bleeding, incomplete drainage, pain and infection    Alternatives discussed:  Delayed treatment  Universal protocol:     Procedure explained and questions answered to patient or proxy's   satisfaction: yes      Test results available : yes      Imaging studies available: yes      Site/side marked: yes      Patient identity confirmed:  Verbally with patient and arm band  Location:     Type:  Abscess    Location:  Lower extremity    Lower extremity location:  Foot    Foot location:  R foot  Pre-procedure details:     Skin preparation:  Povidone-iodine  Sedation:     Sedation type:  None  Anesthesia:     Anesthesia method:  Local infiltration    Local anesthetic:  Lidocaine 1% WITH epi  Procedure type:     Complexity:  Complex  Procedure details:     Incision types:  Single straight    Wound management:  Probed and deloculated    Drainage:  Purulent    Drainage amount:  Copious    Packing materials:  1/2 in iodoform gauze  Post-procedure details:     Procedure completion:  Tolerated   Culture, Blood 2    Specimen: Blood    Narrative    Source:->Blood   XR CHEST PORTABLE     Narrative    Exam: AP portable chest    Clinical indication: SEPSIS    Comparison: 04/08/2024;      Results:  Left basilar opacity.  No pneumothorax.    Heart normal.  Mediastinal contours are normal.     No free air is seen under the hemidiaphragms.   Osseous structures intact.      Impression    IMPRESSION: Left basilar opacity. Developing pneumonia versus atelectasis.    Electronically signed by: Celine Rear, MD 04/15/2024 12:41 AM EDT            Workstation ID: [id number]     XR FOOT RIGHT (2 VIEWS)    Narrative    Exam: 2 views of the right foot.    Clinical indications:  PAIN. RULE OUT OSTEOMYELITIS. Right foot pain and  swelling.  Comparison:   04/15/2024;      Findings:  No fracture.  No dislocation.    No radiopaque foreign bodies. No bone erosions.      Impression    Impression:  No fracture. No bone erosions to suggest osteomyelitis.    Electronically signed by: Celine Rear, MD 04/15/2024 12:52 AM EDT            Workstation ID: [id number]     XR FOOT LEFT (2 VIEWS)    Narrative    Exam: 2 views of the left foot.    Clinical indications:  PAIN right foot. Swelling. Possible osteomyelitis.    Comparison:  None    Findings:  No fracture.  No dislocation. No bone erosions.    No radiopaque foreign bodies.      Impression    Impression:  No fracture.    Electronically signed by: Celine Rear, MD 04/15/2024 12:51 AM EDT            Workstation ID: [id number]     Comprehensive Metabolic Panel   Result Value Ref Range    Potassium 4.2 3.5 - 5.1 mEq/L    Chloride 101 98 - 107 mEq/L    Sodium 140 136 - 145 mEq/L    CO2 29 20 - 31 mEq/L    Glucose 99 74 - 106 mg/dl    BUN 34 (H) 9 - 23 mg/dl    Creatinine 8.56 (H) 0.55 - 1.02 mg/dl    GFR African American 47.0      GFR Non-African American 38      Calcium 9.9 8.7 - 10.4 mg/dl    Anion Gap 10 5 - 15 mmol/L    AST 17.0 0.0 - 33.9 U/L    ALT 14 10 - 49 U/L    Alkaline Phosphatase 75 46 - 116 U/L    Total Bilirubin 0.40 0.30 - 1.20 mg/dl    Total  Protein 7.8 5.7 - 8.2 gm/dl    Albumin 2.4 (L) 3.4 - 5.0 gm/dl   CBC with Auto Differential   Result Value Ref Range    WBC 19.2 (H) 4.0 - 11.0 1000/mm3    RBC 3.37 (L) 3.60 - 5.20 M/uL    Hemoglobin 9.8 (L) 11.0 - 16.0 gm/dl    Hematocrit 68.5 (L) 35.0 - 47.0 %    MCV 93.2 80.0 - 98.0 fL    MCH 29.1 25.4 - 34.6 pg    MCHC 31.2 30.0 - 36.0 gm/dl    Platelets 645 859 - 450 1000/mm3    MPV 9.4 6.0 - 10.0 fL    RDW 52.0 (H) 36.4 - 46.3      Nucleated RBCs 0 0 - 0      Immature Granulocytes % 0.3 0.0 - 3.0 %    Neutrophils Segmented 86.5 (H) 34 - 64 %    Lymphocytes 6.3 (L) 28 - 48 %    Monocytes 6.2 1 - 13 %    Eosinophils 0.5 0 - 5 %    Basophils 0.2 0 - 3 %   proBNP, N-TERMINAL   Result Value Ref Range    NT Pro-BNP 345.0 (H) 0.0 - 125.0 pg/ml   Troponin   Result Value Ref Range    Troponin, High Sensitivity 7 0 - 34 ng/L    Narrative    Troponin orders for chest pain/ACS MUST be ordered q90 x 2 to comply with ACC guidelines -  choose 2 occurences, timed priority with Now and then  90 min frequency.   Lactate, Sepsis   Result Value Ref Range    Lactate 0.9 0.5 - 2.2 mmol/L   Lactate, Sepsis   Result Value Ref Range    Lactate 0.7 0.5 - 2.2 mmol/L   Procalcitonin   Result Value Ref Range    Procalcitonin 0.56 (H) 0.00 - 0.50 ng/ml   Bedside Ultrasound    Narrative    Jacklyn Lynwood POUR, MD     04/15/2024  1:02 AM  Bedside Ultrasound    Date/Time: 04/15/2024 1:02 AM    Performed by: Jacklyn Lynwood POUR, MD  Authorized by: Jacklyn Lynwood POUR, MD    Verbal consent obtained: Yes    Given by:  Patient  Performed by:  Attending  Type of procedure:  Focused soft tissue  Left leg:     Indications:  Swelling and pain    Skin and subcutaneous tissue:  Adequate    Cobblestoning:  Increased    Subcutaneous Collection:  Present    Interpretation:  Abscess and cellulitis  Comments:      Right heel         Medical Decision Making/ ED Course     ED Course as of 04/15/24 0211   Mon Apr 14, 2024   2033 WBC(!): 19.2 [JG]   2033 Hemoglobin Quant(!): 9.8  [JG]   2033 Hematocrit(!): 31.4 [JG]   2307 Lactic 0.9 no evidence of sepsis.  FluidS not given [JG]   2354 Procalcitonin(!): 0.56 [JG]   2356 BEDSIDE us  SHOWS ABSCESS TO RIGHT FOOT [JG]   Tue Apr 15, 2024   0013 Spoke with Dr.  FLONNIE   0023 Call placed to podiatry [JG]   0101 Referral to Dr. Jacklyn, [place] note for ultrasound procedure note. [JG]   0210 EKG: 72 bpm, 186 PR, 72 QRS, normal sinus rhythm, no STEMI, interpreted by me [JG]   0211 Bilateral foot x-ray interpreted by me.  Shows no acute pathology.  Chest x-ray shows no acute pathology [JG]      ED Course User Index  [JG] Feliciano Gains I, PA       INTERNAL/EXTERNAL RECORDS REVIEWED: I reviewed the patient's previous records here at [facility] and available outside facilities and note .     INDEPENDENT HISTORIAN: History and/or plan development assisted by: Daughter and EMS    Severe exacerbation or progression of chronic illness: Yes.     Threat to body function without evaluation and management: Yes    Social Determinants impacting E&M: None       Comorbidities impacting Evaluation and Management: Peripheral neuropathy, COPD    Critical Care Time: None    Additional Providers Consulted: None         Medications   vancomycin  (VANCOCIN ) 2,000 mg in sodium chloride  0.9 % 500 mL IVPB (0 mg IntraVENous Stopped 04/15/24 0011)   ceFEPIme  (MAXIPIME ) 2,000 mg in sterile water  10 mL IV syringe (2,000 mg IntraVENous Given 04/14/24 2207)   acetaminophen  (OFIRMEV ) infusion 1,000 mg (0 mg IntraVENous Stopped 04/14/24 2301)   gabapentin  (NEURONTIN ) capsule 300 mg (300 mg Oral Given 04/14/24 2246)   sodium chloride  0.9 % bolus 1,000 mL (1,000 mLs IntraVENous New Bag 04/15/24 0024)       Final Diagnosis       ICD-10-CM    1. Abscess of right foot  L02.611       2. Right leg pain  M79.604            Disposition  Patient admitted to medicine        The patient was personally evaluated by myself and discussed with  Dr. Jacklyn Agent who agrees with the above assessment and plan.      Sible Straley  I Ciarra Braddy, PA  April 15, 2024    My signature above authenticates this document and my orders, the final    diagnosis (es), discharge prescription (s), and instructions in the Epic    record.  If you have any questions please contact [phone].     Nursing notes have been reviewed by the physician/ advanced practice    Clinician.       Feliciano Gains I, PA  04/15/24 0210       Feliciano Gains I, PA  04/15/24 [phone]

## 2024-04-14 NOTE — Progress Notes (Signed)
 Asked to follow-up on callback from podiatry on this patient by my colleague Dr. Jacklyn.  Received a call back from Dr. Anastasio of [facility] this morning and he has agreed to follow-up with the patient with his knee manage to the patient's treatment team.      Vinie Hill, DO  April 15, 2024

## 2024-04-14 NOTE — ED Notes (Signed)
 Pt arrived on baseline 2L NC d/t hx of copd.      Shona Agent, RN  04/14/24 1936

## 2024-04-14 NOTE — ED Triage Notes (Signed)
 PT presents to the ED via EMS transport from home for eval of feeling generally unwell for 2 days and also with c/o swelling to bilateral feet.

## 2024-04-15 ENCOUNTER — Inpatient Hospital Stay: Admit: 2024-04-15 | Payer: Medicare (Managed Care) | Primary: Family Medicine

## 2024-04-15 ENCOUNTER — Inpatient Hospital Stay: Payer: MEDICARE | Primary: Family Medicine

## 2024-04-15 ENCOUNTER — Emergency Department: Admit: 2024-04-15 | Payer: Medicare (Managed Care) | Primary: Family Medicine

## 2024-04-15 LAB — URINALYSIS
Bilirubin, Urine: NEGATIVE
Glucose, Ur: NEGATIVE mg/dL
Ketones, Urine: NEGATIVE mg/dL
Leukocyte Esterase, Urine: NEGATIVE
Nitrite, Urine: NEGATIVE
Specific Gravity, UA: 1.025 (ref 1.005–1.030)
Urobilinogen, Urine: 0.2 EU/dl (ref 0.0–1.0)
pH, Urine: 5.5 (ref 5.0–9.0)

## 2024-04-15 LAB — CBC WITH AUTO DIFFERENTIAL
Basophils: 0.2 % (ref 0–3)
Eosinophils: 0.5 % (ref 0–5)
Hematocrit: 31.4 % — ABNORMAL LOW (ref 35.0–47.0)
Hemoglobin: 9.8 g/dL — ABNORMAL LOW (ref 11.0–16.0)
Immature Granulocytes %: 0.3 % (ref 0.0–3.0)
Lymphocytes: 6.3 % — ABNORMAL LOW (ref 28–48)
MCH: 29.1 pg (ref 25.4–34.6)
MCHC: 31.2 g/dL (ref 30.0–36.0)
MCV: 93.2 fL (ref 80.0–98.0)
MPV: 9.4 fL (ref 6.0–10.0)
Monocytes: 6.2 % (ref 1–13)
Neutrophils Segmented: 86.5 % — ABNORMAL HIGH (ref 34–64)
Nucleated RBCs: 0 (ref 0–0)
Platelets: 354 1000/mm3 (ref 140–450)
RBC: 3.37 M/uL — ABNORMAL LOW (ref 3.60–5.20)
RDW: 52 — ABNORMAL HIGH (ref 36.4–46.3)
WBC: 19.2 1000/mm3 — ABNORMAL HIGH (ref 4.0–11.0)

## 2024-04-15 LAB — PROBNP, N-TERMINAL: NT Pro-BNP: 345 pg/mL — ABNORMAL HIGH (ref 0.0–125.0)

## 2024-04-15 LAB — EKG 12-LEAD
Atrial Rate: 72 {beats}/min
Calculated P Axis: 68 degrees
Calculated R Axis: 46 degrees
Calculated T Axis: 64 degrees
DIAGNOSIS, 93000: NORMAL
P-R Interval: 186 ms
Q-T Interval: 390 ms
QRS Duration: 72 ms
QTC Calculation (Bezet): 427 ms
Ventricular Rate: 72 {beats}/min

## 2024-04-15 LAB — COMPREHENSIVE METABOLIC PANEL
ALT: 14 U/L (ref 10–49)
AST: 17 U/L (ref 0.0–33.9)
Albumin: 2.4 g/dL — ABNORMAL LOW (ref 3.4–5.0)
Alkaline Phosphatase: 75 U/L (ref 46–116)
Anion Gap: 10 mmol/L (ref 5–15)
BUN: 34 mg/dL — ABNORMAL HIGH (ref 9–23)
CO2: 29 meq/L (ref 20–31)
Calcium: 9.9 mg/dL (ref 8.7–10.4)
Chloride: 101 meq/L (ref 98–107)
Creatinine: 1.43 mg/dL — ABNORMAL HIGH (ref 0.55–1.02)
GFR African American: 47
GFR Non-African American: 38
Glucose: 99 mg/dL (ref 74–106)
Potassium: 4.2 meq/L (ref 3.5–5.1)
Sodium: 140 meq/L (ref 136–145)
Total Bilirubin: 0.4 mg/dL (ref 0.30–1.20)
Total Protein: 7.8 g/dL (ref 5.7–8.2)

## 2024-04-15 LAB — POCT URINALYSIS DIPSTICK
Bilirubin, Urine: NEGATIVE
Glucose, Ur: NEGATIVE mg/dL
Ketones, Urine: NEGATIVE mg/dL
Nitrite, Urine: NEGATIVE
Protein, Urine: 30 mg/dL — AB
Specific Gravity, UA: 1.025 (ref 1.005–1.030)
Urobilinogen, Urine: 0.2 EU/dl (ref 0.0–1.0)
pH, Urine: 5.5 (ref 5–9)

## 2024-04-15 LAB — MICROSCOPIC URINALYSIS

## 2024-04-15 LAB — LACTATE, SEPSIS
Lactate: 0.9 mmol/L (ref 0.5–2.2)
Lactate: 0.7 mmol/L (ref 0.5–2.2)

## 2024-04-15 LAB — PROCALCITONIN: Procalcitonin: 0.56 ng/mL — ABNORMAL HIGH (ref 0.00–0.50)

## 2024-04-15 LAB — TROPONIN: Troponin, High Sensitivity: 7 ng/L (ref 0–34)

## 2024-04-15 MED ORDER — MORPHINE SULFATE (PF) 2 MG/ML IV SOLN
2 | INTRAVENOUS | Status: DC | PRN
Start: 2024-04-15 — End: 2024-05-02
  Administered 2024-04-15 – 2024-05-02 (×12): 1 mg via INTRAVENOUS

## 2024-04-15 MED ORDER — DEXTROSE 50 % IV SOLN
50 | INTRAVENOUS | Status: DC | PRN
Start: 2024-04-15 — End: 2024-04-25

## 2024-04-15 MED ORDER — DOCUSATE SODIUM 100 MG PO CAPS
100 | Freq: Two times a day (BID) | ORAL | Status: DC | PRN
Start: 2024-04-15 — End: 2024-05-02

## 2024-04-15 MED ORDER — IPRATROPIUM-ALBUTEROL 0.5-2.5 (3) MG/3ML IN SOLN
0.5-2.5 | Freq: Three times a day (TID) | RESPIRATORY_TRACT | Status: AC
Start: 2024-04-15 — End: ?
  Administered 2024-04-15 – 2024-04-19 (×7): 1 via RESPIRATORY_TRACT

## 2024-04-15 MED ORDER — OXYCODONE-ACETAMINOPHEN 7.5-325 MG PO TABS
7.5-325 | ORAL | Status: DC | PRN
Start: 2024-04-15 — End: 2024-05-02
  Administered 2024-04-16 – 2024-05-02 (×44): 1 via ORAL

## 2024-04-15 MED ORDER — ONDANSETRON HCL 4 MG/2ML IJ SOLN
4 | Freq: Four times a day (QID) | INTRAMUSCULAR | Status: DC | PRN
Start: 2024-04-15 — End: 2024-05-02
  Administered 2024-04-21: 20:00:00 4 mg via INTRAVENOUS

## 2024-04-15 MED ORDER — IOPAMIDOL 61 % IV SOLN
61 | Freq: Once | INTRAVENOUS | Status: AC | PRN
Start: 2024-04-15 — End: 2024-04-15
  Administered 2024-04-15: 18:00:00 80 mL via INTRAVENOUS

## 2024-04-15 MED ORDER — METRONIDAZOLE 500 MG/100ML IV SOLN
500 | Freq: Three times a day (TID) | INTRAVENOUS | Status: DC
Start: 2024-04-15 — End: 2024-05-02
  Administered 2024-04-15 – 2024-05-02 (×48): 500 mg via INTRAVENOUS

## 2024-04-15 MED ORDER — SENNOSIDES 8.6 MG PO TABS
8.6 | Freq: Every day | ORAL | Status: DC | PRN
Start: 2024-04-15 — End: 2024-05-02

## 2024-04-15 MED ORDER — GUAIFENESIN 100 MG/5ML PO LIQD
100 | ORAL | Status: DC | PRN
Start: 2024-04-15 — End: 2024-04-15

## 2024-04-15 MED ORDER — ACETAMINOPHEN 10 MG/ML IV SOLN
10 | Freq: Once | INTRAVENOUS | Status: AC
Start: 2024-04-15 — End: 2024-04-14
  Administered 2024-04-15: 03:00:00 1000 mg via INTRAVENOUS

## 2024-04-15 MED ORDER — MELATONIN 3 MG PO TABS
3 | Freq: Every evening | ORAL | Status: DC | PRN
Start: 2024-04-15 — End: 2024-05-02

## 2024-04-15 MED ORDER — HEPARIN SODIUM (PORCINE) 5000 UNIT/ML IJ SOLN
5000 | Freq: Two times a day (BID) | INTRAMUSCULAR | Status: DC
Start: 2024-04-15 — End: 2024-05-02
  Administered 2024-04-16 – 2024-05-02 (×25): 5000 [IU] via SUBCUTANEOUS

## 2024-04-15 MED ORDER — IPRATROPIUM-ALBUTEROL 0.5-2.5 (3) MG/3ML IN SOLN
0.5-2.5 | Freq: Four times a day (QID) | RESPIRATORY_TRACT | Status: DC
Start: 2024-04-15 — End: 2024-04-15
  Administered 2024-04-15: 12:00:00 1 via RESPIRATORY_TRACT

## 2024-04-15 MED ORDER — ACETAMINOPHEN 325 MG PO TABS
325 | Freq: Four times a day (QID) | ORAL | Status: DC | PRN
Start: 2024-04-15 — End: 2024-05-02
  Administered 2024-04-18 – 2024-04-29 (×3): 650 mg via ORAL

## 2024-04-15 MED ORDER — VANCOMYCIN HCL 1 G IV SOLR
1 | Freq: Once | INTRAVENOUS | Status: AC
Start: 2024-04-15 — End: 2024-04-15
  Administered 2024-04-15: 02:00:00 2000 mg via INTRAVENOUS

## 2024-04-15 MED ORDER — SODIUM CHLORIDE 0.9 % IV SOLN (MINI-BAG)
0.9 | Freq: Once | INTRAVENOUS | Status: DC
Start: 2024-04-15 — End: 2024-04-14

## 2024-04-15 MED ORDER — VANCOMYCIN INTERMITTENT DOSING (PLACEHOLDER)
Freq: Once | INTRAVENOUS | Status: AC
Start: 2024-04-15 — End: ?

## 2024-04-15 MED ORDER — DAKINS (1/4 STRENGTH) 0.125 % EX SOLN
0.125 | Freq: Every day | CUTANEOUS | Status: DC
Start: 2024-04-15 — End: 2024-05-02
  Administered 2024-04-29: 13:00:00

## 2024-04-15 MED ORDER — ALBUTEROL SULFATE 1.25 MG/3ML IN NEBU
1.25 | RESPIRATORY_TRACT | Status: AC | PRN
Start: 2024-04-15 — End: ?

## 2024-04-15 MED ORDER — GABAPENTIN 300 MG PO CAPS
300 | Freq: Once | ORAL | Status: AC
Start: 2024-04-15 — End: 2024-04-14
  Administered 2024-04-15: 03:00:00 300 mg via ORAL

## 2024-04-15 MED ORDER — STERILE WATER FOR INJECTION (MIXTURES ONLY)
2 | Freq: Once | INTRAVENOUS | Status: AC
Start: 2024-04-15 — End: 2024-04-14
  Administered 2024-04-15: 02:00:00 2000 mg via INTRAVENOUS

## 2024-04-15 MED ORDER — CEFEPIME HCL 1 G IJ SOLR
1 | Freq: Two times a day (BID) | INTRAMUSCULAR | Status: DC
Start: 2024-04-15 — End: 2024-04-25
  Administered 2024-04-15 – 2024-04-25 (×20): 1000 mg via INTRAVENOUS

## 2024-04-15 MED ORDER — SODIUM CHLORIDE 0.9 % IV BOLUS
0.9 | Freq: Once | INTRAVENOUS | Status: AC
Start: 2024-04-15 — End: 2024-04-15
  Administered 2024-04-15: 04:00:00 1000 mL via INTRAVENOUS

## 2024-04-15 MED ORDER — VANCOMYCIN INTERMITTENT DOSING (PLACEHOLDER)
INTRAVENOUS | Status: DC
Start: 2024-04-15 — End: 2024-04-28

## 2024-04-15 MED ORDER — GLUCAGON (RDNA) 1 MG IJ KIT
1 | INTRAMUSCULAR | Status: DC | PRN
Start: 2024-04-15 — End: 2024-04-25

## 2024-04-15 MED ORDER — NALOXONE HCL 0.4 MG/ML IJ SOLN
0.4 | INTRAMUSCULAR | Status: DC | PRN
Start: 2024-04-15 — End: 2024-05-02

## 2024-04-15 MED ORDER — BENZONATATE 100 MG PO CAPS
100 | Freq: Three times a day (TID) | ORAL | Status: DC | PRN
Start: 2024-04-15 — End: 2024-05-02

## 2024-04-15 MED ORDER — VANCOMYCIN 1250 MG IN NS 250 ML (PREMIX) IVPB
Status: AC
Start: 2024-04-15 — End: ?
  Administered 2024-04-16 – 2024-04-17 (×2): 1250 mg via INTRAVENOUS

## 2024-04-15 MED FILL — DAKINS (1/4 STRENGTH) 0.125 % EX SOLN: 0.125 % | CUTANEOUS | Qty: 473

## 2024-04-15 MED FILL — GABAPENTIN 300 MG PO CAPS: 300 mg | ORAL | Qty: 1 | Fill #0

## 2024-04-15 MED FILL — MORPHINE SULFATE 2 MG/ML IJ SOLN: 2 mg/mL | INTRAMUSCULAR | Qty: 1 | Fill #0

## 2024-04-15 MED FILL — IPRATROPIUM-ALBUTEROL 0.5-2.5 (3) MG/3ML IN SOLN: 0.5-2.5 (3) MG/3ML | RESPIRATORY_TRACT | Qty: 3

## 2024-04-15 MED FILL — ACETAMINOPHEN 10 MG/ML IV SOLN: 10 mg/mL | INTRAVENOUS | Qty: 100 | Fill #0

## 2024-04-15 MED FILL — METRONIDAZOLE 500 MG/100ML IV SOLN: 500 MG/100ML | INTRAVENOUS | Qty: 100

## 2024-04-15 MED FILL — CEFEPIME HCL 2 G IV SOLR: 2 g | INTRAVENOUS | Qty: 2 | Fill #0

## 2024-04-15 MED FILL — ISOVUE-300 61 % IV SOLN: 61 % | INTRAVENOUS | Qty: 80

## 2024-04-15 MED FILL — CEFEPIME HCL 1 G IJ SOLR: 1 g | INTRAMUSCULAR | Qty: 1000

## 2024-04-15 MED FILL — VANCOMYCIN HCL 1 G IV SOLR: 1 g | INTRAVENOUS | Qty: 2000 | Fill #0

## 2024-04-15 NOTE — ED Notes (Signed)
 Medicated pt per [facility], RN  04/15/24 1039

## 2024-04-15 NOTE — H&P (Addendum)
 Medicine History and Physical    Patient: Susan [name]   Age:  73 y.o.    Assessment     Right foot abscess   S/p I and D in ER  Left foot swelling  PAD  Chronic hypoxic resp failure  COPD  Possible pneumonia        Plan     Podiatry consulted  Iv abx  Follow I and D cultures, blood culture  Npo until seen by podiatry   Bronchodilators  O2 supplementation  Dvt ppx   Need an updated home med list.   Patient does not recall her home meds but says her daughter will come in AM      Further recommendations based on clinical course. Discussed with patient current assessment and plan and answered all question. Thank you very much for allowing me to participate in this very pleasant patient's care.     DISPO  -Pt to be admitted  at this time for reasons addressed above, continued hospitalization for ongoing assessment and treatment indicated     Anticipated Date of Discharge: pending  Anticipated Disposition (home, SNF) : pending          HPI:   Susan [name] is a 73 y.o. year old female who presents with  foot pain.    Patient is a very pleasant 73 year old female with peripheral artery disease, COPD, chronic respiratory failure uses 2 L home oxygen, comes in with foot pain.  Patient reports she has had pain on both feet for 2 to 3 months, follows with vascular surgery.  She had an MRI done as an outpatient last month.  However in the past few days there was increasing pain, and then yesterday she noted swelling on her right foot/ankle lateral side.  She felt some chills but denies any fever.  There was no trauma on that foot.  Patient presented in ER and noted to have an abscess on her right lateral foot which was I&D.  Patient started on IV antibiotics and podiatry was consulted.  Patient reports her breathing is at baseline, however she had some cough started few hours ago.    Review of Systems -12 point review system done pertinent positive and negative as stated in HPI    Past Medical History:  No past  medical history on file.    Past Surgical History:  No past surgical history on file.    Family History:  No family history on file.    Social History:  Social History     Socioeconomic History    Marital status: Legally Separated       Home Medications:  Prior to Admission medications    Not on File       Allergies:  Allergies   Allergen Reactions    Penicillins Other (See Comments)     States unknown reaction     Mother told her as a child she was allergic           Physical Exam:   Visit Vitals  BP (!) 110/57   Pulse 80   Temp 97.9 F (36.6 C) (Oral)   Resp 18   Ht 1.702 m (5' 7)   Wt 81.6 kg (180 lb)   SpO2 100%   BMI 28.19 kg/m       Physical Exam:   General appearance: alert, cooperative   Head: Normocephalic   Neck: supple, trachea midline  Lungs: mild LLF wheezing  Heart: regular rate and rhythm  Abdomen: soft, non-tender. Bowel sounds normal.    Extremities: extremities wound dressing right foot. Left foot swelling, tender, no crepitus  Skin: Skin dry  Neurologic: aao x 3 follow commands move all 4 ext  PSY: mood and affect normal, appropriately behaved     Intake and Output:  Current Shift:  07/14 1901 - 07/15 0700  In: 600   Out: 100 [Urine:100]  Last three shifts:  No intake/output data recorded.    Lab/Data Reviewed:  Recent Results (from the past 24 hours)   Comprehensive Metabolic Panel    Collection Time: 04/14/24  7:56 PM   Result Value Ref Range    Potassium 4.2 3.5 - 5.1 mEq/L    Chloride 101 98 - 107 mEq/L    Sodium 140 136 - 145 mEq/L    CO2 29 20 - 31 mEq/L    Glucose 99 74 - 106 mg/dl    BUN 34 (H) 9 - 23 mg/dl    Creatinine 8.56 (H) 0.55 - 1.02 mg/dl    GFR African American 47.0      GFR Non-African American 38      Calcium 9.9 8.7 - 10.4 mg/dl    Anion Gap 10 5 - 15 mmol/L    AST 17.0 0.0 - 33.9 U/L    ALT 14 10 - 49 U/L    Alkaline Phosphatase 75 46 - 116 U/L    Total Bilirubin 0.40 0.30 - 1.20 mg/dl    Total Protein 7.8 5.7 - 8.2 gm/dl    Albumin 2.4 (L) 3.4 - 5.0 gm/dl   CBC with Auto  Differential    Collection Time: 04/14/24  7:56 PM   Result Value Ref Range    WBC 19.2 (H) 4.0 - 11.0 1000/mm3    RBC 3.37 (L) 3.60 - 5.20 M/uL    Hemoglobin 9.8 (L) 11.0 - 16.0 gm/dl    Hematocrit 68.5 (L) 35.0 - 47.0 %    MCV 93.2 80.0 - 98.0 fL    MCH 29.1 25.4 - 34.6 pg    MCHC 31.2 30.0 - 36.0 gm/dl    Platelets 645 859 - 450 1000/mm3    MPV 9.4 6.0 - 10.0 fL    RDW 52.0 (H) 36.4 - 46.3      Nucleated RBCs 0 0 - 0      Immature Granulocytes % 0.3 0.0 - 3.0 %    Neutrophils Segmented 86.5 (H) 34 - 64 %    Lymphocytes 6.3 (L) 28 - 48 %    Monocytes 6.2 1 - 13 %    Eosinophils 0.5 0 - 5 %    Basophils 0.2 0 - 3 %   proBNP, N-TERMINAL    Collection Time: 04/14/24  7:56 PM   Result Value Ref Range    NT Pro-BNP 345.0 (H) 0.0 - 125.0 pg/ml   Troponin    Collection Time: 04/14/24  7:56 PM   Result Value Ref Range    Troponin, High Sensitivity 7 0 - 34 ng/L   Lactate, Sepsis    Collection Time: 04/14/24  9:50 PM   Result Value Ref Range    Lactate 0.9 0.5 - 2.2 mmol/L   Procalcitonin    Collection Time: 04/14/24  9:50 PM   Result Value Ref Range    Procalcitonin 0.56 (H) 0.00 - 0.50 ng/ml   Lactate, Sepsis    Collection Time: 04/14/24 10:57 PM   Result Value Ref Range    Lactate 0.7 0.5 - 2.2 mmol/L  POCT Urinalysis no Micro    Collection Time: 04/15/24  3:02 AM   Result Value Ref Range    Glucose, Ur Negative NEGATIVE,Negative mg/dl    Bilirubin, Urine Negative NEGATIVE,Negative      Ketones, Urine Negative NEGATIVE,Negative mg/dl    Specific Gravity, UA 1.025 1.005 - 1.030      Blood, Urine Trace-lysed (A) NEGATIVE,Negative      pH, Urine 5.5 5 - 9      Protein, Urine 30 (A) NEGATIVE,Negative mg/dl    Urobilinogen, Urine 0.2 0.0 - 1.0 EU/dl    Nitrite, Urine Negative NEGATIVE,Negative      Leukocyte Esterase, Urine Trace (A) NEGATIVE,Negative      Color, UA Yellow      Clarity, UA Clear              Norleen Deward VEAR Murrell, MD  April 15, 2024    [facility] medical dictation software was used for portions of this  report.  Unintended voice transcription errors may have occurred.

## 2024-04-15 NOTE — Progress Notes (Signed)
 [facility] Dosing Services: Initial Vancomycin  Note    This consult is provided for this 73 y.o. year old female for vancomycin  dosing.    Ordering physician: Dr. Murrell    Vancomycin  indication: Bloodstream infection    Day of therapy:      Ht Readings from Last 1 Encounters:   04/14/24 1.702 m (5' 7)        Wt Readings from Last 1 Encounters:   04/14/24 81.6 kg (180 lb)        Other Current Antibiotics Cefepime    Significant Cultures     Serum Creatinine Lab Results   Component Value Date/Time    CREATININE 1.43 04/14/2024 07:56 PM      Creatinine Clearance Estimated Creatinine Clearance: 40 mL/min (A) (based on SCr of 1.43 mg/dL (H)).   BUN Lab Results   Component Value Date/Time    BUN 34 04/14/2024 07:56 PM      WBC Lab Results   Component Value Date/Time    WBC 19.2 04/14/2024 07:56 PM      H/H Lab Results   Component Value Date/Time    HGB 9.8 04/14/2024 07:56 PM      Platelets Lab Results   Component Value Date/Time    PLT 354 04/14/2024 07:56 PM      Temp 97.9 F (36.6 C) (Oral)     Trough goal: 15-20 mcg/mL    Plan: Start vancomycin  therapy with a loading dose of 2000 mg at 2211 on 04/14/2024. Follow with a maintenance dose of 1250 mg every 24 hours starting at 2200 on 04/15/2024.    Trough due at 2100 on 04/17/2024.SABRA    Pharmacy to follow daily and will make changes to dose and/or frequency based on clinical status.    Thank you for this consult.

## 2024-04-15 NOTE — Progress Notes (Signed)
 [facility] Dosing Services: Renal Dosing    The following medication was automatically dose-adjusted per [facility] P&T Committee Protocol, with respect to renal function.      Previous Regimen Cefepime  2,000 mg every 12 hours   Serum Creatinine Lab Results   Component Value Date/Time    CREATININE 1.43 04/14/2024 07:56 PM      Creatinine Clearance Estimated Creatinine Clearance: 40 mL/min (A) (based on SCr of 1.43 mg/dL (H)).   BUN Lab Results   Component Value Date/Time    BUN 34 04/14/2024 07:56 PM        Dose changed to cefepime  1,000 mg every 12 hours extended infusion.    Pharmacy to continue to monitor patient daily. Will make dosage adjustments based upon changing renal function.    Cecilia Margarito Cramp   Pharmacist

## 2024-04-15 NOTE — ED Notes (Signed)
 Daughter Bobbette called at this time and updated to current plan of care.     Shona Agent, RN  04/15/24 [phone]

## 2024-04-15 NOTE — ED Notes (Signed)
 Pt eating lunch at bedside.     Ivar Honer, RN  04/15/24 [phone]

## 2024-04-15 NOTE — ED Notes (Signed)
 Gave report to [facility]. Pt moved to [id number]     Audery Lionel HERO, [place]  04/15/24 1209

## 2024-04-15 NOTE — Consults (Signed)
 Foot & Ankle Consult    Subjective:         Date of Consultation: April 15, 2024      Referring Physician:    Patient is a 73 y.o. African American female who is being seen for bilateral foot pain / abscess.  Patient follows dr raeanne SM[facility] last seen in June of this year. Pain / swelling related issues with negative mri. Patient reports symptoms have been on going however due to in ability to walk yesterday decided to come to the ed.     Patient Active Problem List    Diagnosis Date Noted    Foot abscess 04/15/2024    OSA (obstructive sleep apnea)     Dyslipidemia     Asthma     Psychiatric disorder     Chronic obstructive pulmonary disease (HCC)     Bipolar 1 disorder (HCC)     Peripheral neuropathy 06/19/2013    Hypertension 06/19/2013     No past medical history on file.   No past surgical history on file.   No family history on file.   Social History     Tobacco Use    Smoking status: Not on file    Smokeless tobacco: Not on file   Substance Use Topics    Alcohol use: Not on file     Current Facility-Administered Medications   Medication Dose Route Frequency Provider Last Rate Last Admin    senna (SENOKOT) tablet 8.6 mg  1 tablet Oral Daily PRN Matriano, Norleen Deward DEL, MD        docusate sodium  (COLACE) capsule 100 mg  100 mg Oral BID PRN Matriano, Norleen Deward DEL, MD        ondansetron  (ZOFRAN ) injection 4 mg  4 mg IntraVENous Q6H PRN Matriano, Norleen Deward DEL, MD        acetaminophen  (TYLENOL ) tablet 650 mg  650 mg Oral Q6H PRN Matriano, Norleen Deward DEL, MD        melatonin tablet 3 mg  3 mg Oral Nightly PRN Matriano, Norleen Deward DEL, MD        naloxone  (NARCAN ) injection 0.4 mg  0.4 mg IntraVENous PRN Matriano, Norleen Deward DEL, MD        glucagon  injection 1 mg  1 mg IntraMUSCular PRN Matriano, Norleen Deward DEL, MD        dextrose  50 % IV solution  20-30 mL IntraVENous PRN Matriano, Norleen Deward DEL, MD        [START ON 04/16/2024] heparin  (porcine) injection 5,000 Units  5,000 Units SubCUTAneous BID Matriano, Norleen Deward DEL, MD        albuterol   (ACCUNEB ) nebulizer solution 2.5 mg  2.5 mg Nebulization Q4H PRN Matriano, Norleen Deward DEL, MD        benzonatate  (TESSALON ) capsule 100 mg  100 mg Oral TID PRN Matriano, Norleen Deward DEL, MD        guaiFENesin  (ROBITUSSIN) 100 MG/5ML liquid 200 mg  200 mg Oral Q4H PRN Matriano, Norleen Deward DEL, MD        cefepime  (MAXIPIME ) 1,000 mg in sodium chloride  0.9 % 50 mL IVPB (mini-bag)  1,000 mg IntraVENous Q12H Matriano, Norleen Deward DEL, MD 12.5 mL/hr at 04/15/24 1030 1,000 mg at 04/15/24 1030    vancomycin  (VANCOCIN ) 1250 mg in sodium chloride  0.9% 250 mL IVPB  1,250 mg IntraVENous Q24H Matriano, Norleen Deward DEL, MD        [START ON 04/17/2024] Vancomycin  Trough Due  1 each  Other Once Matriano, Norleen Deward DEL, MD        *Pharmacy to Dose Vancomycin   1 each Other RX Placeholder Matriano, Norleen Deward DEL, MD        morphine  (PF) injection 1 mg  1 mg IntraVENous Q3H PRN Matriano, John Paul H, MD   1 mg at 04/15/24 1102    ipratropium 0.5 mg-albuterol  2.5 mg (DUONEB ) nebulizer solution 1 Dose  1 Dose Inhalation TID RT Josepha Planas, DO        sodium hypochlorite (DAKINS) 0.125 % external solution   Irrigation Daily Anastasio Adine LABOR, DPM         Current Outpatient Medications   Medication Sig Dispense Refill    furosemide  (LASIX ) 40 MG tablet Take 1 tablet by mouth daily      carvedilol (COREG) 12.5 MG tablet Take 1 tablet by mouth 2 times daily (with meals)      atorvastatin (LIPITOR) 80 MG tablet Take 1 tablet by mouth nightly      GEMTESA 75 MG TABS tablet Take 1 tablet by mouth daily      VRAYLAR 1.5 MG capsule Take 1 capsule by mouth daily      clopidogrel (PLAVIX) 75 MG tablet Take 1 tablet by mouth daily      QUEtiapine (SEROQUEL) 100 MG tablet Take 1 tablet by mouth daily      pantoprazole (PROTONIX) 40 MG tablet Take 1 tablet by mouth daily      potassium chloride (KLOR-CON) 10 MEQ extended release tablet Take 1 tablet by mouth daily with food      valsartan (DIOVAN) 160 MG tablet Take 1 tablet by mouth 2 times daily      MYRBETRIQ 25 MG TB24  Take 1 tablet by mouth daily      TRELEGY ELLIPTA 200-62.5-25 MCG/ACT AEPB inhaler Inhale 1 puff into the lungs daily        Allergies   Allergen Reactions    Penicillins Other (See Comments)     States unknown reaction     Mother told her as a child she was allergic        Review of Systems:  A comprehensive review of systems was negative except for that written in the History of Present Illness.    Objective:     Patient Vitals for the past 8 hrs:   BP Temp Temp src Pulse Resp SpO2   04/15/24 1215 -- 98.1 F (36.7 C) Oral -- -- --   04/15/24 1206 -- 98.5 F (36.9 C) Oral -- -- --   04/15/24 1130 (!) 139/59 -- -- 81 10 100 %   04/15/24 1100 (!) 133/55 98.2 F (36.8 C) -- 82 14 97 %   04/15/24 0819 -- -- -- 88 15 100 %   04/15/24 0730 120/68 98.3 F (36.8 C) Oral 82 14 97 %   04/15/24 0628 133/77 -- -- 91 18 96 %   04/15/24 0603 117/67 -- -- 83 15 96 %   04/15/24 0545 -- -- -- 81 15 97 %   04/15/24 0535 (!) 111/56 -- -- 82 23 99 %      Temp (24hrs), Avg:98.2 F (36.8 C), Min:97.9 F (36.6 C), Max:98.5 F (36.9 C)       Physical Exam:    Gen   Nad, aaox 3   Normal mood / affect   NC o2    LEPE   Left leg /  ankle  No visual deformity, + pop  No c/c/e  Non palp at / pt     Left foot  Non palp dp/pt, cft 3 sec, tg ok  No c/c/ + edema  + pop  No visual deformity  No cutaneous wounds or lesions    Right ankle / leg   Non palp pulses, tg ok  No ascending cellulitis  Swelling localized distally  Posterior lateral ankle / foot swelling w/ I&d site, serosangeous drainage on the dressing, packing pullsed, deep palpation without expression of purulence or exudate.   No deformity, pain to palpation  Neuro / motor intact     Right foot  Deceased pedal pulses, cft 3 sec, tg ok   Swelling of the foot more so of the ankle   No cellulitis, no other cutaneous wounds or lesions   No deformity   Motor/sensory pl      Xr; report and imaging reviewed quesitonable emphysema vs s/p I&d    Mri from June: report reviewed non  contributory               Lab Review:   Recent Results (from the past 24 hours)   Comprehensive Metabolic Panel    Collection Time: 04/14/24  7:56 PM   Result Value Ref Range    Potassium 4.2 3.5 - 5.1 mEq/L    Chloride 101 98 - 107 mEq/L    Sodium 140 136 - 145 mEq/L    CO2 29 20 - 31 mEq/L    Glucose 99 74 - 106 mg/dl    BUN 34 (H) 9 - 23 mg/dl    Creatinine 8.56 (H) 0.55 - 1.02 mg/dl    GFR African American 47.0      GFR Non-African American 38      Calcium 9.9 8.7 - 10.4 mg/dl    Anion Gap 10 5 - 15 mmol/L    AST 17.0 0.0 - 33.9 U/L    ALT 14 10 - 49 U/L    Alkaline Phosphatase 75 46 - 116 U/L    Total Bilirubin 0.40 0.30 - 1.20 mg/dl    Total Protein 7.8 5.7 - 8.2 gm/dl    Albumin 2.4 (L) 3.4 - 5.0 gm/dl   CBC with Auto Differential    Collection Time: 04/14/24  7:56 PM   Result Value Ref Range    WBC 19.2 (H) 4.0 - 11.0 1000/mm3    RBC 3.37 (L) 3.60 - 5.20 M/uL    Hemoglobin 9.8 (L) 11.0 - 16.0 gm/dl    Hematocrit 68.5 (L) 35.0 - 47.0 %    MCV 93.2 80.0 - 98.0 fL    MCH 29.1 25.4 - 34.6 pg    MCHC 31.2 30.0 - 36.0 gm/dl    Platelets 645 859 - 450 1000/mm3    MPV 9.4 6.0 - 10.0 fL    RDW 52.0 (H) 36.4 - 46.3      Nucleated RBCs 0 0 - 0      Immature Granulocytes % 0.3 0.0 - 3.0 %    Neutrophils Segmented 86.5 (H) 34 - 64 %    Lymphocytes 6.3 (L) 28 - 48 %    Monocytes 6.2 1 - 13 %    Eosinophils 0.5 0 - 5 %    Basophils 0.2 0 - 3 %   proBNP, N-TERMINAL    Collection Time: 04/14/24  7:56 PM   Result Value Ref Range    NT Pro-BNP 345.0 (H) 0.0 - 125.0 pg/ml   Troponin    Collection Time:  04/14/24  7:56 PM   Result Value Ref Range    Troponin, High Sensitivity 7 0 - 34 ng/L   EKG 12 Lead    Collection Time: 04/14/24  8:04 PM   Result Value Ref Range    Ventricular Rate 72 BPM    Atrial Rate 72 BPM    P-R Interval 186 ms    QRS Duration 72 ms    Q-T Interval 390 ms    QTC Calculation (Bezet) 427 ms    Calculated P Axis 68 degrees    Calculated R Axis 46 degrees    Calculated T Axis 64 degrees    DIAGNOSIS, 93000        Normal sinus rhythm  Normal ECG  When compared with ECG of 08-Apr-2024 10:10,  No significant change was found  Confirmed by Judyth Raker ([id number]) on 04/15/2024 8:08:41 AM     Culture, Blood 1    Collection Time: 04/14/24  9:50 PM    Specimen: Blood   Result Value Ref Range    BLOOD CULTURE RESULT Culture In Progress, Daily Updates To Follow     Culture, Blood 2    Collection Time: 04/14/24  9:50 PM    Specimen: Blood   Result Value Ref Range    BLOOD CULTURE RESULT Culture In Progress, Daily Updates To Follow     Lactate, Sepsis    Collection Time: 04/14/24  9:50 PM   Result Value Ref Range    Lactate 0.9 0.5 - 2.2 mmol/L   Procalcitonin    Collection Time: 04/14/24  9:50 PM   Result Value Ref Range    Procalcitonin 0.56 (H) 0.00 - 0.50 ng/ml   Lactate, Sepsis    Collection Time: 04/14/24 10:57 PM   Result Value Ref Range    Lactate 0.7 0.5 - 2.2 mmol/L   Culture, Wound W Gram Stain    Collection Time: 04/15/24 12:36 AM    Specimen: Foot, Right   Result Value Ref Range    Gram Stain Result <10 WBC's/lpf  No Organisms Seen       Urinalysis    Collection Time: 04/15/24  3:00 AM   Result Value Ref Range    Color, UA Yellow Yellow,Straw      Clarity, UA Clear Clear      Glucose, Ur Negative Negative mg/dl    Bilirubin, Urine Negative Negative      Ketones, Urine Negative Negative mg/dl    Specific Gravity, UA 1.025 1.005 - 1.030      Blood, Urine Trace-Intact (A) Negative      pH, Urine 5.5 5.0 - 9.0      Protein, Urine Trace (A) Negative mg/dl    Urobilinogen, Urine 0.2 0.0 - 1.0 EU/dl    Nitrite, Urine Negative Negative      Leukocyte Esterase, Urine Negative Negative     Microscopic Urinalysis    Collection Time: 04/15/24  3:00 AM   Result Value Ref Range    Squam Epithel, UA 30-49 (A) NEGATIVE,OCCASIONAL,1-4,5-9,10-14,15-29 /LPF    WBC, UA 1-4 (A) NEGATIVE /HPF    RBC, UA OCCASIONAL (A) NEGATIVE /HPF    BACTERIA, URINE OCCASIONAL (A) NEGATIVE /HPF   POCT Urinalysis no Micro    Collection Time: 04/15/24  3:02 AM    Result Value Ref Range    Glucose, Ur Negative NEGATIVE,Negative mg/dl    Bilirubin, Urine Negative NEGATIVE,Negative      Ketones, Urine Negative NEGATIVE,Negative mg/dl    Specific Gravity, UA 1.025  1.005 - 1.030      Blood, Urine Trace-lysed (A) NEGATIVE,Negative      pH, Urine 5.5 5 - 9      Protein, Urine 30 (A) NEGATIVE,Negative mg/dl    Urobilinogen, Urine 0.2 0.0 - 1.0 EU/dl    Nitrite, Urine Negative NEGATIVE,Negative      Leukocyte Esterase, Urine Trace (A) NEGATIVE,Negative      Color, UA Yellow      Clarity, UA Clear         Imaging Results:  XR FOOT RIGHT (2 VIEWS)  Result Date: 04/15/2024  Exam: 2 views of the right foot. Clinical indications:  PAIN. RULE OUT OSTEOMYELITIS. Right foot pain and swelling. Comparison:   04/15/2024;  Findings:  No fracture.  No dislocation. No radiopaque foreign bodies. No bone erosions.     Impression:  No fracture. No bone erosions to suggest osteomyelitis. Electronically signed by: Celine Rear, MD 04/15/2024 12:52 AM EDT          Workstation ID: [id number]     XR FOOT LEFT (2 VIEWS)  Result Date: 04/15/2024  Exam: 2 views of the left foot. Clinical indications:  PAIN right foot. Swelling. Possible osteomyelitis. Comparison:  None Findings:  No fracture.  No dislocation. No bone erosions. No radiopaque foreign bodies.     Impression:  No fracture. Electronically signed by: Celine Rear, MD 04/15/2024 12:51 AM EDT          Workstation ID: [id number]     XR CHEST PORTABLE  Result Date: 04/15/2024  Exam: AP portable chest Clinical indication: SEPSIS Comparison: 04/08/2024;  Results:  Left basilar opacity. No pneumothorax. Heart normal.  Mediastinal contours are normal. No free air is seen under the hemidiaphragms.   Osseous structures intact.     IMPRESSION: Left basilar opacity. Developing pneumonia versus atelectasis. Electronically signed by: Celine Rear, MD 04/15/2024 12:41 AM EDT          Workstation ID: [id number]     VL DUP LOWER EXTREMITY VENOUS  BILATERAL  Result Date: 04/09/2024                                                                                                                                    Version: 1  Study ID: [id number]                                                       [facility]                                                       [address] [place], [place]  [address]                                                      Lower Extremity Venous Duplex Report Name: NILSA, MACHT Date: 04/08/2024, 2: 68 PM MRN: [id number]                                                               Patient Location: ER^[facility]^WT^[facility] Age: 39 Years                                                               DOB: 1950-11-23 (MM/DD/YYYY)                                                                               Gender: Female Performed By: Kylee R. Holloman, RVT                                       Account #:: [id number] Ordering Physician: [facility] [place], [facility] B Reason For Study: Lower extremity pain and swelling  INTERPRETATION SUMMARY 1. No evidence of deep vein thrombosis in the bilateral lower extremities. QUALITY/PROCEDURE Complete bilateral venous duplex performed. Quality of the study is good. Location: ER. RIGHT LEG The deep venous system of the right lower extremity was examined using duplex ultrasound. B-mode imaging demonstrated normal compressibility of the right common femoral, femoral, popliteal, posterior tibial and peroneal veins. Doppler flow signals were spontaneous and phasic at all levels. RIGHT SAPHENOUS VEINS Right great saphenous vein at the saphenofemoral junction demonstrates normal compressibility with spontaneous  and phasic venous hemodynamics. LEFT LEG The deep venous system of the left lower extremity was examined using duplex ultrasound. B-mode imaging demonstrated normal compressibility of the left common femoral, femoral, popliteal, posterior tibial and peroneal veins. Doppler flow signals were spontaneous and phasic at all levels. LEFT LEG SAPHENOUS VEINS Left great saphenous vein at the saphenofemoral junction demonstrates normal compressibility with spontaneous and phasic venous hemodynamics. ______________________________________________________________________________ Electronically signed by: Dr Blenda Fear, M.D                       04/09/2024, 8: 57 AM     XR CHEST (2 VW)  Result Date: 04/08/2024  History: Shortness of breath Comparison: None Technique: PA and lateral radiographs of the chest were obtained. Two views, 2 exposures total. Findings: The lungs and pleural spaces are clear. Normal cardiomediastinal silhouette. Degenerative changes of the thoracic spine.     IMPRESSION: No acute cardiopulmonary disease. Electronically signed by: Norleen Gleason 04/08/2024 11:20 AM EDT          Workstation ID: [id number]     ECHO CARDIOGRAM COMPLETE-IN HOUSE  Result Date: 03/21/2024  CONCLUSIONS   * Left ventricular systolic function is normal with an ejection fraction of 66 % by Simpson's biplane.   * Left ventricular chamber size is normal.   * Left ventricular diastolic function: indeterminate.   * Right ventricular systolic function is normal.   * Right ventricular chamber dimension is normal.   * There is mild aortic valve stenosis with a peak velocity of 2.8 m/s, mean gradient of 15 mmHg, and aortic valve area of 1.59 cm2.   * There is mild aortic valve regurgitation.   * Aortic valve regurgitation pressure halftime is 570 ms.   * There is trace mitral valve regurgitation.   * There is moderate tricuspid valve regurgitation.   * Moderate pulmonary hypertension, estimated pulmonary arterial systolic pressure is 51 mmHg.    * There is no pericardial effusion.   * The aortic root at the sinus of Valsalva is normal measuring 2.95 cm with an index of 1.5.   * The proximal ascending aorta is normal measuring 2.88 cm with an index of 1.4 cm/m2.   * For the indication of aortic valve calcification, findings are as noted. Comparison   * Compared to prior study from 09/14/17, changes include new mild aortic stenosis.   * Prior estimated pulmonary arterial systolic pressure was 48 mmHg.   * Trace aortic and tricuspid regurgitation increased. Patient Info Name:     Laia Wiley Age:     31 years DOB:     [birth year] Gender:     Female MRN:     [id number] Ht:     67 in Wt:     188 lb BSA:     2.03 m2 BP:     138 /     72 mmHg Heart Rhythm:     Sinus Rhythm Exam Date:  03/21/2024 2:57 PM Patient Status:     OP Exam Type:     ECHO CARDIOGRAM COMPLETE Indications     Aortic valve calcification - Left Ventricle   Left ventricular systolic function is normal with an ejection fraction of 66 % by Simpson's biplane. Left ventricular segmental wall motion is normal. Left ventricular chamber size is normal. Left ventricular diastolic function: indeterminate. Right Ventricle   Tricuspid annular plane systolic excursion (TAPSE) is normal, 2.8 cm. Right ventricular systolic function is normal. Right ventricular chamber dimension is normal. Ventricular Septum   Intact interventricular septum visualized by color Doppler imaging. Left Atrium   Left atrial chamber is normal with a left atrial volume index biplane method of disk (BP MOD) of 24 ml/m^2. Right Atrium   Right atrial chamber size is normal. Atrial Septum   Intact interatrial septum visualized by color Doppler imaging. Aortic Valve   The aortic valve is tricuspid. There is mild aortic valve stenosis with a peak velocity of 2.8 m/s, mean gradient of 15 mmHg, and aortic valve area of 1.59 cm2. There is mild aortic valve regurgitation. Aortic valve regurgitation pressure halftime is 570 ms. The aortic  valve dimensionless index by VTI is 0.56. Left ventricular outflow tract stroke volume index is 42.14 ml/m2. Pulmonic Valve   The pulmonic valve is not well visualized. There is no pulmonic valve stenosis. There is mild pulmonic regurgitation. Mitral Valve   The mitral valve has normal leaflets. There is no mitral valve stenosis. There is trace mitral valve regurgitation. Tricuspid Valve   The tricuspid valve leaflets are normal. There is no tricuspid valve stenosis. There is moderate tricuspid valve regurgitation. Moderate pulmonary hypertension, estimated pulmonary arterial systolic pressure is 51 mmHg. Pericardium/Pleural   There is no pericardial effusion. Inferior Vena Cava   Normal inferior vena cava diameter with >50% collapse upon inspiration consistent with normal right atrial pressure, 3 mmHg. Aorta   The proximal ascending aorta is normal measuring 2.88 cm with an index of 1.4 cm/m2. The aortic root at the sinus of Valsalva is normal measuring 2.95 cm with an index of 1.5. The aortic measurements are indexed to body surface area. Left Ventricular Outflow Tract ---------------------------------------------------------------------- Name                                 Value        Normal ---------------------------------------------------------------------- LVOT 2D ---------------------------------------------------------------------- LVOT Diameter                       1.9 cm               LVOT Doppler ---------------------------------------------------------------------- LVOT Peak Velocity              124.0 cm/s               LVOT Peak Gradient                6.0 mmHg               LVOT Mean Gradient                3.0 mmHg               LVOT VTI                           30.2 cm  LVOT Stroke Volume                 85.6 ml               LVOT Stroke Volume Index        42.1 ml/m2               LVOT Area                          2.8 cm2 Mitral Valve  ---------------------------------------------------------------------- Name                                 Value        Normal ---------------------------------------------------------------------- MV Diastolic Function ---------------------------------------------------------------------- MV E Peak Velocity               83.1 cm/s               MV A Peak Velocity              117.0 cm/s               MV E/A                                 0.7       0.8-2.0 MV Decel Time PW                    232 ms               MV Annular TDI ---------------------------------------------------------------------- MV Septal e' Velocity             6.5 cm/s         >=7.0 MV E/e' (Septal)                      12.8               MV Lateral e' Velocity            7.9 cm/s        >=10.0 MV E/e' (Lateral)                     10.5               MV e' Average                     7.2 cm/s               MV E/e' (Average)                     11.7        <=14.0 Tricuspid Valve ---------------------------------------------------------------------- Name                                 Value        Normal ---------------------------------------------------------------------- TV Regurgitation Doppler ---------------------------------------------------------------------- TR Peak Velocity                345.0 cm/s       <=280.0 TR Peak Gradient                 48.0 mmHg  Estimated PAP/RSVP ---------------------------------------------------------------------- RA Pressure                         3 mmHg            <8 PA Systolic Pressure               51 mmHg           <35 RV Systolic Pressure               51 mmHg           <36 Aorta ---------------------------------------------------------------------- Name                                 Value        Normal ---------------------------------------------------------------------- Ascending Aorta ---------------------------------------------------------------------- Sinus of Valsalva  Diameter          3.0 cm               Prox Asc Ao Diameter                2.9 cm         <=3.1 Prox Asc Ao Diameter Index       1.4 cm/m2         <=1.9 Venous ---------------------------------------------------------------------- Name                                 Value        Normal ---------------------------------------------------------------------- IVC/SVC ---------------------------------------------------------------------- IVC Diameter (Exp 2D)               1.9 cm         <=2.1 Aortic Valve ---------------------------------------------------------------------- Name                                 Value        Normal ---------------------------------------------------------------------- AV Doppler ---------------------------------------------------------------------- AV Peak Velocity                   2.8 m/s               AV Peak Gradient                 30.0 mmHg               AV Mean Gradient                 15.0 mmHg               AV VTI                             54.0 cm               AV Area (Cont Eq VTI)              1.6 cm2         >=3.0 AV Area Index (Cont Eq VTI)     0.8 cm2/m2               AV Area (Cont Eq Vel)              1.3 cm2  AV Area Index (Cont Eq Vel)     0.6 cm2/m2                AV Dimensionless Index (Peak Velocity)                        0.4                  AV Dimensionless Index (VTI)                                  0.6               AV Regurgitation 2D ---------------------------------------------------------------------- LVOT Area                          2.8 cm2               AV Regurgitation Doppler ---------------------------------------------------------------------- AR Decel Slope                  192.0 cm/s2               AR PHT                              570 ms Ventricles ---------------------------------------------------------------------- Name                                 Value        Normal  ---------------------------------------------------------------------- LV Dimensions 2D/MM ----------------------------------------------------------------------  IVS Diastolic Thickness (2D)                                1.3 cm       0.5-0.9 LVID Diastole (2D)                  4.5 cm       3.8-5.2  LVPW Diastolic Thickness (2D)                                1.3 cm       0.5-0.9 LVID Systole (2D)                   3.1 cm       2.2-3.5 LVID Diastolic Index (2D)        2.2 cm/m2       2.3-3.1 LVOT Diameter                       1.9 cm               LV Fractional Shortening/Ejection Fraction 2D/MM ---------------------------------------------------------------------- LV EF (2D Teichholz)                  58 %               LV EF (BP MOD)                        66 %         54-74  LV Fractional Shortening (2D)  32.9 %     27.0-45.0  LV Diastolic Volume Index (BP MOD)                        24.7 ml/m2     29.0-61.0 LV SV (2C MOD)                     28.0 ml               LV SV (BP MOD)                     33.3 ml               LV SV (Cubed)                      60.3 ml               LV SV (Teich)                      53.5 ml               LV EDV (Cubed)                     90.9 ml               LV ESV (Cubed)                     30.6 ml               LV EDV (Teich)                     92.2 ml               LV ESV (Teich)                     38.8 ml               RV Dimensions 2D/MM ----------------------------------------------------------------------  RV Basal Diastolic Dimension                           3.2 cm       2.5-4.1 TAPSE                               2.8 cm         >=1.7 TAPSV                            10.2 cm/s               RV/LV Diam Ratio (Diastole)            0.9 Atria ---------------------------------------------------------------------- Name                                 Value        Normal ---------------------------------------------------------------------- LA  Dimensions ---------------------------------------------------------------------- LA Dimension (2D)                   3.0 cm       2.7-3.8 LA Dimen Index (2D)  1.5 cm/m2               LA Volume (BP A-L)                 49.6 ml               LA Volume Index (BP A-L)        24.4 ml/m2         <35.0 LA Volume Index (BP MOD)        23.5 ml/m2        <=34.0 RA Dimensions ----------------------------------------------------------------------  RA Diastolic Major Axis Length (4C)                         4.4 cm                RA Diastolic Major Axis Length Index (4C)                      0.2               RA Area (4C)                      14.4 cm2         <21.0 RA ESV Index (4C MOD)           19.3 ml/m2     15.0-27.0 Wall Motion Scoring Wall Motion Scoring Index:     1.00 Technical Quality:     Fair Complete transthoracic echocardiogram performed with 2D imaging, color Doppler, and spectral Doppler. Staff Referring Provider:     Garnette To Ordering Provider:     Glendia DELENA Marina MD Sonographer:     Luke ONEIDA Finder RDCS [id number] Report Signatures Finalized by Glendia DELENA Marina  MD on 03/21/2024 03:44 PM       Impression:     Right leg swelling  Abscess?      Recommendation:     - patient underwent I&d of the posterior lateral right ankle, I removed the packing and deeply palpated the site, no appreciable exudate expressed. Local dressing applied  Wcx: ngtd  - will order serial packing and dressing changes  - ct w/ ordered to determine if further I&d needed vs continued course.   - non palp pedal circulation, pvl ordered  - ok to d/c npo, diet ordered      - patient reports long term bilateral LE pain, xr negative for pathology related, etiology remains unclear at this point          Adine DELENA Caster, DPM  Fellowship Trained Foot & Ankle Surgeon  [facility]  [facility]  Office [phone]  Fax [phone]

## 2024-04-15 NOTE — Progress Notes (Signed)
 Chart reviewed along with discussion of patient.     Ct w/ contrast of RLE    Anastasio

## 2024-04-15 NOTE — ED Notes (Signed)
 Received report from [employer]. Rounded on pt. Pt awake on stretcher, connected to cardiac monitor. Pt c/o 7/10 RLE pain, asking for pain meds and ice chips. Emptied purewick canister. Call light in reach.      Audery Lionel HERO, RN  04/15/24 [phone]

## 2024-04-15 NOTE — ED Notes (Signed)
 Pt reconnected to monitor.   Call bell within reach.        Rosan Au [facility]  04/15/24 1220

## 2024-04-15 NOTE — Progress Notes (Signed)
 Peripheral Vascular Lab Preliminary : Ankle Brachial Index     1. Moderate arterial insufficiency in the right lower extremity arterial examination at rest.   2. Severe arterial insufficiency in the left lower extremity arterial examination at rest.   3. Digital pressures deferred due to patients's pain.     Right ABI = 0.63  Left ABI = 0.48     Final report to follow   [facility], RVT.

## 2024-04-15 NOTE — ED Notes (Signed)
 Introduced self to pt. Pt AXOX4. Pt connected to cardiac monitor, bed low/locked,and call bell within reach. Pt reporting pain 9/10 in both feet. Will assess after pain meds.     Ivar Honer, RN  04/15/24 [phone]

## 2024-04-15 NOTE — ED Notes (Signed)
 Medicated pt per MAR. Provided pt with ice chips. No further current needs. Call light in reach.      Audery Lionel HERO, RN  04/15/24 [phone]

## 2024-04-15 NOTE — Progress Notes (Signed)
 Patient admitted after midnight, seen and examined.  Appreciate podiatry assistance.  Continue IV antibiotic per ID.

## 2024-04-15 NOTE — ED Notes (Signed)
 Medicated pt per [facility], RN  04/15/24 1108

## 2024-04-15 NOTE — Progress Notes (Signed)
 Seen in ED  Right posterior lateral ankle s/p I&d  Serosangenous exudate on dressing, local edema, no ascending cellulitis, no lymphangitis    Ct w/ order to determine if further I&d needed    Will place wound care orders repeat packing with 1/4 packing soaked in 1/4% dakins solution.    Wcx: ngtd    Non pap pulses, pvl to be ordered      Full consult to follow    Susan [name]

## 2024-04-15 NOTE — ED Notes (Signed)
 Report given to [facility].     Ivar Honer, RN  04/15/24 [phone]

## 2024-04-15 NOTE — ED Notes (Signed)
 Rounded on pt. Pt asking for more ice chips. Provided pt with more ice chips. Pain reassessment done, call light in reach.     Audery Lionel HERO, RN  04/15/24 [phone]

## 2024-04-15 NOTE — Consults (Signed)
 INFECTIOUS DISEASE CONSULT NOTE         Requested by: Dr. Josepha    Reason for consult:Right foor abscess    Date of admission: 04/14/2024    Date of consult: April 15, 2024      ABX:     Current abx Prior abx    Cefepime /Vanco 7/14-1  Flagyl  7/15-0      ASSESSMENT:      Right foot abscess  -Unclear cause of swelling and pain that has been present for several months  -Has been evaluated by orthopedic  -Patient seen by [facility] specialist Dr. Curtistine Emperor with last visit 03/11/2024 for right contusion of dorsum of foot, lower extremity edema and right foot pronation deformity  -03/08/2024 MRI right hindfoot no fracture.  Mild multifocal osseous edema?  Altered gait.  And mechanical stress-related changes.  Mild intrinsic foot muscular atrophic and intramuscular edema.  Subcu edema along the dorsum of the foot  -xray no fracture  -S/p I&D by ED pending cultures   - Patient seen by podiatrist Dr. Roberts   - Awaiting CT foot    Left foot swelling  -Xray no fracture  -Low threshold for   Leucocytosis POA   AKI/CKD  -Abnormal creatinine few weeks ago on routine blood work at [employer]   PAD  -Patient has been following with vascular surgeon and last seen in office on 04/08/2024  -12/2023 ABI at [facility] with severe left lower extremity arterial insufficiency at rest and right first digital disease with known proximal arterial insufficiency  -12/2023 s/p right lower extremity angiogram showed single-vessel peroneal runoff with good branches   Ch hypoxic resp failure   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  -Has been following with neurosurgery at [facility] artery aneurysm  -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 and following with [facility] neurosurgery with plan for continued management   Ax- PCN -as a child   Co-morb- COPD, schizoaffective disorder, HTN, depression,          RECOMMENDATIONS:     Complicated 73 year old female  with significant comorbidities coming with bilateral feet swelling, right worse than left    Assessment    Right foot abscess  - Coming with swelling, pain of right foot particular involving he  - Understand I&D in ED by provider and cultures in progress  - Seen by podiatry and awaiting CT and Doppler  - Unclear etiology  - Continue with local wound care, limb elevation and broad-spectrum antibiotic    Left foot swelling  - Tenderness on dorsal aspect  - Pulses palpable to weak  - No obvious ulcer or drainage  - Continue with elevation  - Await Dopplers    Bilateral leg edema  - Recent PVL 7/8 was negative for DVT but low threshold to repeat    Leukocytosis likely reactive and monitor    AKI?  CKD  - Avoid nephrotoxic medications and dose for current creatinine clearance  - Will order renal ultrasound for completeness    Other details as above      Plan  -Continue IV cefepime  and Vanco  -Will add Flagyl  for anaerobic coverage  -Await CT foot  -Await further input from podiatry  -Await vascular studies  - Keep legs elevated on pillows  -Retroperitoneal ultrasound  - Monitor serial lab including inflammatory markers  - Monitor for any ADE  - Remains at risk for worsening  - Plan  discussed with Dr. Olam Campi                     MICROBIOLOGY:     7/14 Blcx x2 IP   Wd cx IP  7/15 Ucx IP    LINES AND CATHETERS:     PIV    HPI:     Ms. Godette is a 73 year old African-American female with past medical history of COPD, schizoaffective disorder, hypertension, depression, PAD, peripheral neuropathy was admitted on 7/14 with worsening leg pain.    Patient is not a good historian and says lives with her daughter.  She has been almost bedbound for quite some time. Pt has been following with [facility] for basilar artery aneurysm and conservative Mx.  Patient also developed right leg swelling for which she was referred to Ortho and eventually podiatry.  It was not thought to be the part of her back leading to right leg swelling and  neuropathy.  Patient also following with vascular and had procedure done in March with good runoff and did not think was concern for pain and swelling.  Patient last seen by podiatry 03/11/2024 and plan for conservative management.     She was seen in ED on 8 July with leg swelling.  Workup showed negative PVL for DVT and chest x-ray without any significant finding.  She was noted with pitting edema and was given some Lasix  and asked to follow-up with PCP for adjustment in medications.  Patient returned 7/14 with worsening swelling of the legs, right worse than left.  Workup showed stable vitals, afebrile state.  WBC elevated at 19, Hb 9.8, PLT 354, BUN 34, CR 1.43, AST 17, ALT 14.  Blood cultures were obtained.  Patient had bilateral feet x-ray which showed no fracture or concern for osteo.  Chest x-ray was suggestive of left basilar opacity with developing pneumonia versus atelectasis.  Patient had I&D in the ED.  Podiatry was consulted and CT was ordered.  Wound culture in progress.  Patient on broad-spectrum antibiotic with cefepime  and Vanco.  ID was asked for further evaluation management.        Past medical history:   COPD, seizure subcu disorder, hypertension, depression, basilar artery aneurysm, PAD, chronic hypoxic respiratory failure, neuropathy lower extremity    Social History:     Social History     Socioeconomic History    Marital status: Legally Separated     Spouse name: Not on file    Number of children: Not on file    Years of education: Not on file    Highest education level: Not on file   Occupational History    Not on file   Tobacco Use    Smoking status: Not on file    Smokeless tobacco: Not on file   Substance and Sexual Activity    Alcohol use: Not on file    Drug use: Not on file    Sexual activity: Not on file   Other Topics Concern    Not on file   Social History Narrative    Not on file     Social Drivers of Health     Financial Resource Strain: Not on file   Food Insecurity: Not on file    Transportation Needs: Not on file   Physical Activity: Not on file   Stress: Not on file   Social Connections: Not on file   Intimate Partner Violence: Not on file   Housing Stability: Not on file  Lives with her daughter.    Family History:   No family history on file.    Allergies:     Allergies   Allergen Reactions    Penicillins Other (See Comments)     States unknown reaction     Mother told her as a child she was allergic         Home Medications:   Not in a hospital admission.     Current Medications:     Current Facility-Administered Medications   Medication Dose Route Frequency Provider Last Rate Last Admin    senna (SENOKOT) tablet 8.6 mg  1 tablet Oral Daily PRN Matriano, Norleen Deward DEL, MD        docusate sodium  (COLACE) capsule 100 mg  100 mg Oral BID PRN Matriano, Norleen Deward DEL, MD        ondansetron  (ZOFRAN ) injection 4 mg  4 mg IntraVENous Q6H PRN Matriano, Norleen Deward DEL, MD        acetaminophen  (TYLENOL ) tablet 650 mg  650 mg Oral Q6H PRN Matriano, Norleen Deward DEL, MD        melatonin tablet 3 mg  3 mg Oral Nightly PRN Matriano, Norleen Deward DEL, MD        naloxone  (NARCAN ) injection 0.4 mg  0.4 mg IntraVENous PRN Matriano, Norleen Deward DEL, MD        glucagon  injection 1 mg  1 mg IntraMUSCular PRN Matriano, Norleen Deward DEL, MD        dextrose  50 % IV solution  20-30 mL IntraVENous PRN Matriano, Norleen Deward DEL, MD        [START ON 04/16/2024] heparin  (porcine) injection 5,000 Units  5,000 Units SubCUTAneous BID Matriano, Norleen Deward DEL, MD        albuterol  (ACCUNEB ) nebulizer solution 2.5 mg  2.5 mg Nebulization Q4H PRN Matriano, Norleen Deward DEL, MD        benzonatate  (TESSALON ) capsule 100 mg  100 mg Oral TID PRN Matriano, Norleen Deward DEL, MD        guaiFENesin  (ROBITUSSIN) 100 MG/5ML liquid 200 mg  200 mg Oral Q4H PRN Matriano, Norleen Deward DEL, MD        cefepime  (MAXIPIME ) 1,000 mg in sodium chloride  0.9 % 50 mL IVPB (mini-bag)  1,000 mg IntraVENous Q12H Matriano, Norleen Deward DEL, MD 12.5 mL/hr at 04/15/24 1030 1,000 mg at 04/15/24 1030     vancomycin  (VANCOCIN ) 1250 mg in sodium chloride  0.9% 250 mL IVPB  1,250 mg IntraVENous Q24H Matriano, Norleen Deward DEL, MD        [START ON 04/17/2024] Vancomycin  Trough Due  1 each Other Once Matriano, Norleen Deward DEL, MD        *Pharmacy to Dose Vancomycin   1 each Other RX Placeholder Matriano, Norleen Deward DEL, MD        morphine  (PF) injection 1 mg  1 mg IntraVENous Q3H PRN Matriano, John Paul H, MD   1 mg at 04/15/24 1102    ipratropium 0.5 mg-albuterol  2.5 mg (DUONEB ) nebulizer solution 1 Dose  1 Dose Inhalation TID RT Josepha Planas, DO         Current Outpatient Medications   Medication Sig Dispense Refill    furosemide  (LASIX ) 40 MG tablet Take 1 tablet by mouth daily      carvedilol (COREG) 12.5 MG tablet Take 1 tablet by mouth 2 times daily (with meals)      atorvastatin (LIPITOR) 80 MG tablet Take 1 tablet by mouth nightly  GEMTESA 75 MG TABS tablet Take 1 tablet by mouth daily      VRAYLAR 1.5 MG capsule Take 1 capsule by mouth daily      clopidogrel (PLAVIX) 75 MG tablet Take 1 tablet by mouth daily      QUEtiapine (SEROQUEL) 100 MG tablet Take 1 tablet by mouth daily      pantoprazole (PROTONIX) 40 MG tablet Take 1 tablet by mouth daily      potassium chloride (KLOR-CON) 10 MEQ extended release tablet Take 1 tablet by mouth daily with food      valsartan (DIOVAN) 160 MG tablet Take 1 tablet by mouth 2 times daily      MYRBETRIQ 25 MG TB24 Take 1 tablet by mouth daily      TRELEGY ELLIPTA 200-62.5-25 MCG/ACT AEPB inhaler Inhale 1 puff into the lungs daily         Review of Systems:   12 points ROS attempted but difficult as patient is not a good historian.  Please see HPI for further details      Physical Exam:  Vitals  Temp (24hrs), Avg:98.2 F (36.8 C), Min:97.9 F (36.6 C), Max:98.5 F (36.9 C)    BP (!) 139/59   Pulse 81   Temp 98.1 F (36.7 C) (Oral)   Resp 10   Ht 1.702 m (5' 7)   Wt 81.6 kg (180 lb)   SpO2 100%   BMI 28.19 kg/m     General: Fairly developed and nourished 73 y.o. year-old,  female, in no acute distress and chronically sick appearing  HEENT: Normocephalic, anicteric sclerae, Pupils equal, round reactive to light, no oropharyngeal lesions. No sinus tenderness.  Neck: Supple, no lymphadenopathy, masses or thyromegaly  Chest: Symmetrical expansion  Lungs: Clear to auscultation bilaterally, no dullness  Heart: Regular rhythm, no murmur, no rub or gallop, No JVD  Abdomen: Soft, non-tender,non distended, no organomegaly, BS+  Musculoskeletal: Bilateral legs with edema.  Right foot currently under dressing which was not removed.  Edema noted on the dorsal aspect of the left foot and tender on deep palpation.  Feet are warm though difficult to palpate pulses  CNS: AAOx3.  Follows simple commands.  SKIN: No obvious   skin lesion or rash. Dry, warm, intact          Labs: Results:   Chemistry Recent Labs     04/14/24  1956   NA 140   K 4.2   CL 101   CO2 29   BUN 34*      CBC w/Diff Recent Labs     04/14/24  1956   WBC 19.2*   RBC 3.37*   HGB 9.8*   HCT 31.4*   PLT 354      Microbiology Invalid input(s): CULT       Imaging-    XR FOOT RIGHT (2 VIEWS)  Result Date: 04/15/2024  Exam: 2 views of the right foot. Clinical indications:  PAIN. RULE OUT OSTEOMYELITIS. Right foot pain and swelling. Comparison:   04/15/2024;  Findings:  No fracture.  No dislocation. No radiopaque foreign bodies. No bone erosions.     Impression:  No fracture. No bone erosions to suggest osteomyelitis. Electronically signed by: Celine Rear, MD 04/15/2024 12:52 AM EDT          Workstation ID: [id number]     XR FOOT LEFT (2 VIEWS)  Result Date: 04/15/2024  Exam: 2 views of the left foot. Clinical indications:  PAIN right foot. Swelling. Possible osteomyelitis.  Comparison:  None Findings:  No fracture.  No dislocation. No bone erosions. No radiopaque foreign bodies.     Impression:  No fracture. Electronically signed by: Celine Rear, MD 04/15/2024 12:51 AM EDT          Workstation ID: [id number]     XR CHEST  PORTABLE  Result Date: 04/15/2024  Exam: AP portable chest Clinical indication: SEPSIS Comparison: 04/08/2024;  Results:  Left basilar opacity. No pneumothorax. Heart normal.  Mediastinal contours are normal. No free air is seen under the hemidiaphragms.   Osseous structures intact.     IMPRESSION: Left basilar opacity. Developing pneumonia versus atelectasis. Electronically signed by: Celine Rear, MD 04/15/2024 12:41 AM EDT          Workstation ID: [id number]     VL DUP LOWER EXTREMITY VENOUS BILATERAL  Result Date: 04/09/2024                                                                                                                                    Version: 1                                                                                                                                   Study ID: [id number]                                                       [facility]                                                       [address] [place], [place]  [address]  Lower Extremity Venous Duplex Report Name: MADDELYNN, MOOSMAN Date: 04/08/2024, 2: 33 PM MRN: [id number]                                                               Patient Location: ER^ERWT^WT^[facility] Age: 86 Years                                                               DOB: [birth year] (MM/DD/YYYY)                                                                               Gender: Female Performed By: Kylee R. Holloman, RVT                                       Account #:: [id number] Ordering Physician: [facility], [facility] Reason For Study: Lower extremity pain and swelling                                                     INTERPRETATION SUMMARY 1. No evidence of deep vein thrombosis in the bilateral lower extremities. QUALITY/PROCEDURE Complete bilateral  venous duplex performed. Quality of the study is good. Location: ER. RIGHT LEG The deep venous system of the right lower extremity was examined using duplex ultrasound. B-mode imaging demonstrated normal compressibility of the right common femoral, femoral, popliteal, posterior tibial and peroneal veins. Doppler flow signals were spontaneous and phasic at all levels. RIGHT SAPHENOUS VEINS Right great saphenous vein at the saphenofemoral junction demonstrates normal compressibility with spontaneous and phasic venous hemodynamics. LEFT LEG The deep venous system of the left lower extremity was examined using duplex ultrasound. B-mode imaging demonstrated normal compressibility of the left common femoral, femoral, popliteal, posterior tibial and peroneal veins. Doppler flow signals were spontaneous and phasic at all levels. LEFT LEG SAPHENOUS VEINS Left great saphenous vein at the saphenofemoral junction demonstrates normal compressibility with spontaneous and phasic venous hemodynamics. ______________________________________________________________________________ Electronically signed by: Dr Blenda Fear, M.D                       04/09/2024, 8: 57 AM     XR CHEST (2 VW)  Result Date: 04/08/2024  History: Shortness of  breath Comparison: None Technique: PA and lateral radiographs of the chest were obtained. Two views, 2 exposures total. Findings: The lungs and pleural spaces are clear. Normal cardiomediastinal silhouette. Degenerative changes of the thoracic spine.     IMPRESSION: No acute cardiopulmonary disease. Electronically signed by: Norleen Gleason 04/08/2024 11:20 AM EDT          Workstation ID: [id number]     ECHO CARDIOGRAM COMPLETE-IN HOUSE  Result Date: 03/21/2024  CONCLUSIONS   * Left ventricular systolic function is normal with an ejection fraction of 66 % by Simpson's biplane.   * Left ventricular chamber size is normal.   * Left ventricular diastolic function: indeterminate.   * Right ventricular systolic  function is normal.   * Right ventricular chamber dimension is normal.   * There is mild aortic valve stenosis with a peak velocity of 2.8 m/s, mean gradient of 15 mmHg, and aortic valve area of 1.59 cm2.   * There is mild aortic valve regurgitation.   * Aortic valve regurgitation pressure halftime is 570 ms.   * There is trace mitral valve regurgitation.   * There is moderate tricuspid valve regurgitation.   * Moderate pulmonary hypertension, estimated pulmonary arterial systolic pressure is 51 mmHg.   * There is no pericardial effusion.   * The aortic root at the sinus of Valsalva is normal measuring 2.95 cm with an index of 1.5.   * The proximal ascending aorta is normal measuring 2.88 cm with an index of 1.4 cm/m2.   * For the indication of aortic valve calcification, findings are as noted. Comparison   * Compared to prior study from 09/14/17, changes include new mild aortic stenosis.   * Prior estimated pulmonary arterial systolic pressure was 48 mmHg.   * Trace aortic and tricuspid regurgitation increased. Patient Info Name:     Inza Mikrut Age:     59 years DOB:     [birth year] Gender:     Female MRN:     [id number] Ht:     67 in Wt:     188 lb BSA:     2.03 m2 BP:     138 /     72 mmHg Heart Rhythm:     Sinus Rhythm Exam Date:     03/21/2024 2:57 PM Patient Status:     OP Exam Type:     ECHO CARDIOGRAM COMPLETE Indications     Aortic valve calcification - Left Ventricle   Left ventricular systolic function is normal with an ejection fraction of 66 % by Simpson's biplane. Left ventricular segmental wall motion is normal. Left ventricular chamber size is normal. Left ventricular diastolic function: indeterminate. Right Ventricle   Tricuspid annular plane systolic excursion (TAPSE) is normal, 2.8 cm. Right ventricular systolic function is normal. Right ventricular chamber dimension is normal. Ventricular Septum   Intact interventricular septum visualized by color Doppler imaging. Left Atrium   Left atrial  chamber is normal with a left atrial volume index biplane method of disk (BP MOD) of 24 ml/m^2. Right Atrium   Right atrial chamber size is normal. Atrial Septum   Intact interatrial septum visualized by color Doppler imaging. Aortic Valve   The aortic valve is tricuspid. There is mild aortic valve stenosis with a peak velocity of 2.8 m/s, mean gradient of 15 mmHg, and aortic valve area of 1.59 cm2. There is mild aortic valve regurgitation. Aortic valve regurgitation pressure halftime is 570 ms. The aortic valve dimensionless index  by VTI is 0.56. Left ventricular outflow tract stroke volume index is 42.14 ml/m2. Pulmonic Valve   The pulmonic valve is not well visualized. There is no pulmonic valve stenosis. There is mild pulmonic regurgitation. Mitral Valve   The mitral valve has normal leaflets. There is no mitral valve stenosis. There is trace mitral valve regurgitation. Tricuspid Valve   The tricuspid valve leaflets are normal. There is no tricuspid valve stenosis. There is moderate tricuspid valve regurgitation. Moderate pulmonary hypertension, estimated pulmonary arterial systolic pressure is 51 mmHg. Pericardium/Pleural   There is no pericardial effusion. Inferior Vena Cava   Normal inferior vena cava diameter with >50% collapse upon inspiration consistent with normal right atrial pressure, 3 mmHg. Aorta   The proximal ascending aorta is normal measuring 2.88 cm with an index of 1.4 cm/m2. The aortic root at the sinus of Valsalva is normal measuring 2.95 cm with an index of 1.5. The aortic measurements are indexed to body surface area. Left Ventricular Outflow Tract ---------------------------------------------------------------------- Name                                 Value        Normal ---------------------------------------------------------------------- LVOT 2D ---------------------------------------------------------------------- LVOT Diameter                       1.9 cm               LVOT Doppler  ---------------------------------------------------------------------- LVOT Peak Velocity              124.0 cm/s               LVOT Peak Gradient                6.0 mmHg               LVOT Mean Gradient                3.0 mmHg               LVOT VTI                           30.2 cm                 LVOT Stroke Volume                 85.6 ml               LVOT Stroke Volume Index        42.1 ml/m2               LVOT Area                          2.8 cm2 Mitral Valve ---------------------------------------------------------------------- Name                                 Value        Normal ---------------------------------------------------------------------- MV Diastolic Function ---------------------------------------------------------------------- MV E Peak Velocity               83.1 cm/s               MV A Peak Velocity  117.0 cm/s               MV E/A                                 0.7       0.8-2.0 MV Decel Time PW                    232 ms               MV Annular TDI ---------------------------------------------------------------------- MV Septal e' Velocity             6.5 cm/s         >=7.0 MV E/e' (Septal)                      12.8               MV Lateral e' Velocity            7.9 cm/s        >=10.0 MV E/e' (Lateral)                     10.5               MV e' Average                     7.2 cm/s               MV E/e' (Average)                     11.7        <=14.0 Tricuspid Valve ---------------------------------------------------------------------- Name                                 Value        Normal ---------------------------------------------------------------------- TV Regurgitation Doppler ---------------------------------------------------------------------- TR Peak Velocity                345.0 cm/s       <=280.0 TR Peak Gradient                 48.0 mmHg               Estimated PAP/RSVP ---------------------------------------------------------------------- RA Pressure                          3 mmHg            <8 PA Systolic Pressure               51 mmHg           <35 RV Systolic Pressure               51 mmHg           <36 Aorta ---------------------------------------------------------------------- Name                                 Value        Normal ---------------------------------------------------------------------- Ascending Aorta ---------------------------------------------------------------------- Sinus of Valsalva Diameter          3.0 cm               Prox Asc Ao Diameter  2.9 cm         <=3.1 Prox Asc Ao Diameter Index       1.4 cm/m2         <=1.9 Venous ---------------------------------------------------------------------- Name                                 Value        Normal ---------------------------------------------------------------------- IVC/SVC ---------------------------------------------------------------------- IVC Diameter (Exp 2D)               1.9 cm         <=2.1 Aortic Valve ---------------------------------------------------------------------- Name                                 Value        Normal ---------------------------------------------------------------------- AV Doppler ---------------------------------------------------------------------- AV Peak Velocity                   2.8 m/s               AV Peak Gradient                 30.0 mmHg               AV Mean Gradient                 15.0 mmHg               AV VTI                             54.0 cm               AV Area (Cont Eq VTI)              1.6 cm2         >=3.0 AV Area Index (Cont Eq VTI)     0.8 cm2/m2               AV Area (Cont Eq Vel)              1.3 cm2               AV Area Index (Cont Eq Vel)     0.6 cm2/m2                AV Dimensionless Index (Peak Velocity)                        0.4                  AV Dimensionless Index (VTI)                                  0.6               AV Regurgitation 2D  ---------------------------------------------------------------------- LVOT Area                          2.8 cm2               AV Regurgitation Doppler ---------------------------------------------------------------------- AR Decel Slope                  192.0 cm/s2  AR PHT                              570 ms Ventricles ---------------------------------------------------------------------- Name                                 Value        Normal ---------------------------------------------------------------------- LV Dimensions 2D/MM ----------------------------------------------------------------------  IVS Diastolic Thickness (2D)                                1.3 cm       0.5-0.9 LVID Diastole (2D)                  4.5 cm       3.8-5.2  LVPW Diastolic Thickness (2D)                                1.3 cm       0.5-0.9 LVID Systole (2D)                   3.1 cm       2.2-3.5 LVID Diastolic Index (2D)        2.2 cm/m2       2.3-3.1 LVOT Diameter                       1.9 cm               LV Fractional Shortening/Ejection Fraction 2D/MM ---------------------------------------------------------------------- LV EF (2D Teichholz)                  58 %               LV EF (BP MOD)                        66 %         54-74  LV Fractional Shortening (2D)                                32.9 %     27.0-45.0  LV Diastolic Volume Index (BP MOD)                        24.7 ml/m2     29.0-61.0 LV SV (2C MOD)                     28.0 ml               LV SV (BP MOD)                     33.3 ml               LV SV (Cubed)                      60.3 ml               LV SV (Teich)                      53.5 ml  LV EDV (Cubed)                     90.9 ml               LV ESV (Cubed)                     30.6 ml               LV EDV (Teich)                     92.2 ml               LV ESV (Teich)                     38.8 ml               RV Dimensions 2D/MM  ----------------------------------------------------------------------  RV Basal Diastolic Dimension                           3.2 cm       2.5-4.1 TAPSE                               2.8 cm         >=1.7 TAPSV                            10.2 cm/s               RV/LV Diam Ratio (Diastole)            0.9 Atria ---------------------------------------------------------------------- Name                                 Value        Normal ---------------------------------------------------------------------- LA Dimensions ---------------------------------------------------------------------- LA Dimension (2D)                   3.0 cm       2.7-3.8 LA Dimen Index (2D)              1.5 cm/m2               LA Volume (BP A-L)                 49.6 ml               LA Volume Index (BP A-L)        24.4 ml/m2         <35.0 LA Volume Index (BP MOD)        23.5 ml/m2        <=34.0 RA Dimensions ----------------------------------------------------------------------  RA Diastolic Major Axis Length (4C)                         4.4 cm                RA Diastolic Major Axis Length Index (4C)                      0.2               RA Area (4C)  14.4 cm2         <21.0 RA ESV Index (4C MOD)           19.3 ml/m2     15.0-27.0 Wall Motion Scoring Wall Motion Scoring Index:     1.00 Technical Quality:     Fair Complete transthoracic echocardiogram performed with 2D imaging, color Doppler, and spectral Doppler. Staff Referring Provider:     Garnette To Ordering Provider:     Glendia DELENA Marina MD Sonographer:     Luke ONEIDA Finder RDCS [id number] Report Signatures Finalized by Glendia DELENA Marina  MD on 03/21/2024 03:44 PM       ---------------------------------------------------------------------------------------------------------------  I have independently examined the patient and reviewed all lab studies and imgaing as well as review of nursing notes and physican notes from the past 24 hours. The plan of care has been  discussed with the patient/relative and all questions are answered.     Dragon medical dictation software was used for portions of this report. Unintended errors may occur.     VIVIANE GORMAN GORE, MD  04/15/2024    [facility]   Office [phone]  [phone]

## 2024-04-15 NOTE — ED Notes (Signed)
 Pt taken to CT.     Ivar Honer, RN  04/15/24 1345

## 2024-04-15 NOTE — ED Notes (Signed)
 Rounded on pt. Pt denies current needs.      Audery Lionel HERO, RN  04/15/24 1143

## 2024-04-16 ENCOUNTER — Inpatient Hospital Stay: Payer: MEDICARE | Primary: Family Medicine

## 2024-04-16 LAB — CBC WITH AUTO DIFFERENTIAL
Basophils: 0.2 % (ref 0–3)
Eosinophils: 0.7 % (ref 0–5)
Hematocrit: 31 % — ABNORMAL LOW (ref 35.0–47.0)
Hemoglobin: 9.2 g/dL — ABNORMAL LOW (ref 11.0–16.0)
Immature Granulocytes %: 0.6 % (ref 0.0–3.0)
Lymphocytes: 5.9 % — ABNORMAL LOW (ref 28–48)
MCH: 29.3 pg (ref 25.4–34.6)
MCHC: 29.7 g/dL — ABNORMAL LOW (ref 30.0–36.0)
MCV: 98.7 fL — ABNORMAL HIGH (ref 80.0–98.0)
MPV: 10.3 fL — ABNORMAL HIGH (ref 6.0–10.0)
Monocytes: 6.5 % (ref 1–13)
Neutrophils Segmented: 86.1 % — ABNORMAL HIGH (ref 34–64)
Nucleated RBCs: 0 (ref 0–0)
Platelets: 328 1000/mm3 (ref 140–450)
RBC: 3.14 M/uL — ABNORMAL LOW (ref 3.60–5.20)
RDW: 55.9 — ABNORMAL HIGH (ref 36.4–46.3)
WBC: 15.8 1000/mm3 — ABNORMAL HIGH (ref 4.0–11.0)

## 2024-04-16 LAB — MAGNESIUM: Magnesium: 1.6 mg/dL (ref 1.6–2.6)

## 2024-04-16 LAB — SEDIMENTATION RATE: Sed Rate, Automated: 110 mm/h — ABNORMAL HIGH (ref 0–30)

## 2024-04-16 LAB — COMPREHENSIVE METABOLIC PANEL
ALT: 11 U/L (ref 10–49)
AST: 14 U/L (ref 0.0–33.9)
Albumin: 2.4 g/dL — ABNORMAL LOW (ref 3.4–5.0)
Alkaline Phosphatase: 82 U/L (ref 46–116)
Anion Gap: 11 mmol/L (ref 5–15)
BUN: 23 mg/dL (ref 9–23)
CO2: 24 meq/L (ref 20–31)
Calcium: 9.4 mg/dL (ref 8.7–10.4)
Chloride: 101 meq/L (ref 98–107)
Creatinine: 1.07 mg/dL — ABNORMAL HIGH (ref 0.55–1.02)
GFR African American: >60
GFR Non-African American: 54
Glucose: 98 mg/dL (ref 74–106)
Potassium: 4.4 meq/L (ref 3.5–5.1)
Sodium: 136 meq/L (ref 136–145)
Total Bilirubin: 0.4 mg/dL (ref 0.30–1.20)
Total Protein: 7.6 g/dL (ref 5.7–8.2)

## 2024-04-16 LAB — CULTURE, URINE: Culture Result: <10000 — AB

## 2024-04-16 LAB — C-REACTIVE PROTEIN: CRP: 252.2 mg/L — ABNORMAL HIGH (ref 0.0–5.0)

## 2024-04-16 MED FILL — OXYCODONE-ACETAMINOPHEN 7.5-325 MG PO TABS: 7.5-325 mg | ORAL | Qty: 1

## 2024-04-16 MED FILL — HEPARIN SODIUM (PORCINE) 5000 UNIT/ML IJ SOLN: 5000 [IU]/mL | INTRAMUSCULAR | Qty: 1

## 2024-04-16 MED FILL — IPRATROPIUM-ALBUTEROL 0.5-2.5 (3) MG/3ML IN SOLN: 0.5-2.5 (3) MG/3ML | RESPIRATORY_TRACT | Qty: 3

## 2024-04-16 MED FILL — MORPHINE SULFATE 2 MG/ML IJ SOLN: 2 mg/mL | INTRAMUSCULAR | Qty: 1 | Fill #0

## 2024-04-16 MED FILL — H-CHLOR 12 0.125 % EX SOLN: 0.125 % | CUTANEOUS | Qty: 473 | Fill #0

## 2024-04-16 MED FILL — VANCOMYCIN 1250 MG IN NS 250 ML (PREMIX) IVPB: Qty: 250

## 2024-04-16 MED FILL — METRONIDAZOLE 500 MG/100ML IV SOLN: 500 MG/100ML | INTRAVENOUS | Qty: 100

## 2024-04-16 MED FILL — CEFEPIME HCL 1 G IJ SOLR: 1 g | INTRAMUSCULAR | Qty: 1000

## 2024-04-16 MED FILL — MORPHINE SULFATE 2 MG/ML IJ SOLN: 2 mg/mL | INTRAMUSCULAR | Qty: 1

## 2024-04-16 NOTE — Consults (Signed)
 [facility]    Consultation  Susan LOIS Fear, MD      Admit date: 04/14/2024  Consult date: 04/16/2024  Referring provider: Josepha Planas, DO    CC: Right foot abscess/heel abscess    Assessment/Plan:   Susan [name] is an 73 y.o. 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  I am unclear if we will be able to optimize this patient's flow any more than this.  Will obtain new noninvasive studies and make decisions from there.    Antibiotics per primary  Following closely    Thank you for allowing us  to participate in the care of this patient.    Susan LOIS Fear, MD  Vascular and Endovascular Surgery  [facility]  Office: [phone]      HPI: Susan [name] is an 73 y.o. female who I been following in the office with history of right lower extremity edema and pain status post right lower extremity angiogram with peroneal runoff who presents for consultation evaluation of right foot/heel wound.  Patient had an abscess and is status post incision and drainage.  She has severe pain.        No past medical history on file.    atorvastatin - 80 MG  clopidogrel - 75 MG    No past surgical history on file.     Allergies   Allergen Reactions    Penicillins Other (See Comments)     States unknown reaction     Mother told her as a child she was allergic       No family history on file.    Social History     Socioeconomic History    Marital status: Legally Separated     Spouse name: Not on file    Number of children: Not on file    Years of education: Not on file    Highest education level: Not on file   Occupational History    Not on file   Tobacco Use    Smoking status: Not on file    Smokeless tobacco: Not  on file   Substance and Sexual Activity    Alcohol use: Not on file    Drug use: Not on file    Sexual activity: Not on file   Other Topics Concern    Not on file   Social History Narrative    Not on file     Social Drivers of Health     Financial Resource Strain: Not on file   Food Insecurity: No Food Insecurity (04/16/2024)    Hunger Vital Sign     Worried About Running Out of Food in the Last Year: Never true     Ran Out of Food in the Last Year: Never true   Transportation Needs: No Transportation Needs (04/16/2024)    PRAPARE - Therapist, art (Medical): No     Lack of Transportation (Non-Medical): No   Physical Activity: Not on file   Stress: Not on file   Social Connections: Not on file   Intimate Partner Violence: Not on file   Housing Stability: Low Risk  (04/16/2024)    Housing Stability Vital Sign  Unable to Pay for Housing in the Last Year: No     Number of Times Moved in the Last Year: 0     Homeless in the Last Year: No         Review of Systems  Positive Findings will be BOLDED, otherwise negative  Constitutional:  Chills, diaphoresis, fever, malaise/fatigue, weakness, weight loss   Eyes:  Vision changes  ENT/Mouth/Face:  Congestion, headaches, sore throat   Respiratory:  Cough, shortness of breath   Cardiovascular:  Chest pain, claudication, leg swelling, palpitations   Gastrointestinal:  Abdominal pain, blood in stool, nausea, vomiting   Genitourinary:  Dysuria, flank pain, frequency, blood in urine  Integumentary:  Rashes  Hematologic: Easy bruising/bleeding  Musculoskeletal: Back pain, muscle pain  Neurological: Dizziness, focal weakness, seizures, sensory changes  Psych: Depression, memory loss, nervous/anxious, substance abuse    Objective:   Physical Exam  BP 114/74   Pulse 79   Temp 99.9 F (37.7 C) (Temporal)   Resp 17   Ht 1.702 m (5' 7)   Wt 81.6 kg (180 lb)   SpO2 100%   BMI 28.19 kg/m   Constitutional: Comfortably sitting in the chair  Eyes: Conjunctive  and lids normal  ENT: oral mucosa normal  Neck: No JVD   Neuro: No focal deficits  Psych: alert and oriented x 3   Respiratory: symmetrical chest rise, non-labored breaths  Skin: warm  Cardiac: regular rate and rhythm  Abdomen: No masses  Skin: See below  Extremities: Old dressing removed and wound appears to be without any obvious purulence but significant fibrinous findings and a deep wound.  [place]'s soaked Kerlix within wound  Vascular:  Palpable femoral pulses bilaterally nonpalpable pedal pulses.        Labs   CBC w/Diff   Lab Results   Component Value Date/Time    WBC 15.8 (H) 04/16/2024 06:15 AM    RBC 3.14 (L) 04/16/2024 06:15 AM    HCT 31.0 (L) 04/16/2024 06:15 AM    MCV 98.7 (H) 04/16/2024 06:15 AM    MCH 29.3 04/16/2024 06:15 AM    MCHC 29.7 (L) 04/16/2024 06:15 AM    RDW 55.9 (H) 04/16/2024 06:15 AM    MPV 10.3 (H) 04/16/2024 06:15 AM        Basic Metabolic Profile   Lab Results   Component Value Date    NA 136 04/16/2024    CO2 24 04/16/2024    BUN 23 04/16/2024    GLUCOSE 98 04/16/2024    CALCIUM 9.4 04/16/2024        Coagulation   No results found for: INR, APTT     PVL/Radiology:  I independently reviewed the images of the prior angiograms    Charts Reviewed: [facility] chart    I discussed the patient with Dr. Josepha Susan LOIS Rosamond, MD  [facility]  Office: [phone]

## 2024-04-16 NOTE — Progress Notes (Addendum)
 Assessment/Plan:     Patient seen for complicated medical problems as listed below :    Right foot abscess   S/p I and D in ER  Left foot swelling  PAD  Chronic hypoxic resp failure  COPD  Possible pneumonia  Plan     Reviewed CT foot, showed abscess   CT left foot per daughter request.  podiatry on board, planning I&D with wound VAC application tomorrow afternoon.  N.p.o. after midnight.  Continue Iv abx, ID on board.  Follow I and D cultures, blood culture  Continue bronchodilators  O2 supplementation  Dvt ppx with heparin  subcu, hold for I&D tomorrow   Continue home regimen for otherwise chronic, stable medical conditions as noted above.   I will order CBC and renal function for tomorrow    Reason for continued hospitalization: I&D    EXAM:  GENERAL: in mild distress.   RESPIRATORY: Bilateral BS present. No rales or rhonchi.   CARDIOVASCULAR: S1 and S2 present, regular. No murmur  ABDOMEN: Soft and nontender with positive bowel sounds.   NEUROLOGICAL: Cranial nerves II through XII grossly intact. No focal weakness.     Subjective:         Vitals:    04/16/24 0730 04/16/24 0846 04/16/24 0944 04/16/24 1100   BP: 109/67 (!) 124/59  107/63   Pulse: 82 82  79   Resp: 15  15    Temp:  99.1 F (37.3 C)  98.2 F (36.8 C)   TempSrc:  Temporal  Temporal   SpO2: 100% 100%  100%   Weight:       Height:           Recent Labs     04/16/24  0615   WBC 15.8*   HGB 9.2*   HCT 31.0*   MCV 98.7*   PLT 328     Recent Labs     04/16/24  1121   NA 136   K 4.4   CL 101   CO2 24   ANIONGAP 11   CALCIUM 9.4   BUN 23   CREATININE 1.07*   GLUCOSE 98     Recent Labs     04/16/24  1121   AST 14.0   ALKPHOS 82   BILITOT 0.40   ALT 11     Lab Results   Component Value Date/Time    COLORU Yellow 04/15/2024 03:02 AM    CLARITYU Clear 04/15/2024 03:02 AM    GLUCOSEU Negative 04/15/2024 03:02 AM    BILIRUBINUR Negative 04/15/2024 03:02 AM    KETUA Negative 04/15/2024 03:02 AM    BLOODU Trace-lysed 04/15/2024 03:02 AM    PHUR 5.5 04/15/2024  03:02 AM    NITRU Negative 04/15/2024 03:02 AM    LEUKOCYTESUR Trace 04/15/2024 03:02 AM     No results for input(s): POCGLU in the last 72 hours.      Discussed with patient and family regarding management, prognosis, treatment and complications of the patient's medical conditions in detail. All questions answered  to the satisfaction of those individual(s) who also verbalized understanding of and agreement with the assessment and plan.     Dr. [place], Garnette RAMAN, MD, thank you for allowing us  to participate in the care of this patient.  Please contact me through the hospital if questions arise.  Dragon medical dictation software was used for portions of this report. Unintended errors may occur.     Olam Campi D.O.  [facility]

## 2024-04-16 NOTE — Progress Notes (Addendum)
 Foot & Ankle Progress Note    Patient: Susan [name] MRN: [id number]  SSN: [id number]    Date of Birth: 1951-03-23  Age: 73 y.o.  Sex: female      Admit Date: 04/14/2024    LOS: 1 day     Subjective:     Feet hurt    Objective:     Vitals:    04/16/24 0231 04/16/24 0300 04/16/24 0331 04/16/24 0630   BP: (!) 116/56 102/62 112/75    Pulse: 72 68 73    Resp: 11 12 10     Temp:    98.6 F (37 C)   TempSrc:    Oral   SpO2: 100% 95% 100%    Weight:       Height:            Intake and Output:  Current Shift: No intake/output data recorded.  Last three shifts: 07/14 1901 - 07/16 0700  In: 600   Out: 1400 [Urine:1400]    Physical Exam:   Gen              Nad, aaox 3              Normal mood / affect              NC o2     LEPE              Left leg /  ankle  No visual deformity, + pop  No c/c/e  Non palp at / pt                 Left foot  Non palp dp/pt, cft 3 sec, tg ok  No c/c/ + edema  + pop  No visual deformity  No cutaneous wounds or lesions     Right ankle / leg              Non palp pulses, tg ok  No ascending cellulitis  Swelling localized distally  Posterior lateral ankle / foot swelling w/ I&d site, serosangeous drainage on the dressing, packing pullsed, deep palpation without expression of purulence or exudate.   No deformity, pain to palpation  Neuro / motor intact                 Right foot  Deceased pedal pulses, cft 3 sec, tg ok              Swelling of the foot more so of the ankle              No cellulitis, no other cutaneous wounds or lesions              No deformity              Motor/sensory pl       Lab/Data Review:  Recent Results (from the past 24 hours)   Magnesium    Collection Time: 04/16/24  6:15 AM   Result Value Ref Range    Magnesium 1.6 1.6 - 2.6 mg/dL   CBC with Auto Differential    Collection Time: 04/16/24  6:15 AM   Result Value Ref Range    WBC 15.8 (H) 4.0 - 11.0 1000/mm3    RBC 3.14 (L) 3.60 - 5.20 M/uL    Hemoglobin 9.2 (L) 11.0 - 16.0 gm/dl    Hematocrit 68.9 (L) 35.0 - 47.0 %    MCV  98.7 (H) 80.0 - 98.0 fL    MCH 29.3  25.4 - 34.6 pg    MCHC 29.7 (L) 30.0 - 36.0 gm/dl    Platelets 671 859 - 450 1000/mm3    MPV 10.3 (H) 6.0 - 10.0 fL    RDW 55.9 (H) 36.4 - 46.3      Nucleated RBCs 0 0 - 0      Immature Granulocytes % 0.6 0.0 - 3.0 %    Neutrophils Segmented 86.1 (H) 34 - 64 %    Lymphocytes 5.9 (L) 28 - 48 %    Monocytes 6.5 1 - 13 %    Eosinophils 0.7 0 - 5 %    Basophils 0.2 0 - 3 %   C-Reactive Protein    Collection Time: 04/16/24  6:15 AM   Result Value Ref Range    CRP 252.2 (H) 0.0 - 5.0 mg/L        Imaging Results:  US  RETROPERITONEAL COMPLETE  Result Date: 04/15/2024  INDICATION: Acute renal failure. COMPARISON: None. TECHNIQUE: Transabdominal grayscale and duplex sonography of the retroperitoneum performed. FINDINGS: RIGHT KIDNEY:  10.7 cm in length. (Normal range 9-13 cm) Normal renal parenchymal echogenicity. No obstruction, renal calculi or focal lesion. LEFT KIDNEY: 9.8 cm in length. (Normal range 9-13 cm) Normal renal parenchymal echogenicity.  And interpolar simple-appearing partially exophytic left renal cyst measures 2.6 x 2.6 x 2.1 cm. URINARY BLADDER: Bladder has a pre-void volume of 240.0 mL. (<50 mL WNL) Patient did not void for the exam. Neither ureteral jet(s) identified. No urinary bladder wall thickening or debris within the urinary bladder. (Normal wall thickness <3 mm distended, <5 mm underdistended.) Bladder contains a Foley catheter and is decompressed. PANCREAS:  Visualized pancreas is within normal limits. IVC: Patent. ABDOMINAL AORTA: Normal caliber abdominal aorta. COMMON ILIAC ARTERIES: Suboptimally visualized secondary to interposed bowel gas.     IMPRESSION: 1.  No hydronephrosis. Kidneys unremarkable sonographically with the exception of a partially exophytic simple-appearing interpolar left renal cyst. 2.  Urinary bladder unremarkable sonographically. 3.  Common iliac arteries above the visualized sonographically due to interposed bowel gas Electronically  signed by: Alm Conte, MD 04/15/2024 8:34 PM EDT          Workstation ID: [id number]     CT FOOT RIGHT W CONTRAST  Result Date: 04/15/2024  Indication: Right foot infection. Evaluate for abscess. Comparison: None available Technique: Computed tomographic imaging of the right foot was performed following IV contrast administration. Coronal and sagittal reconstructions were created from the initial axial data set and submitted for interpretation. Findings: Complex gas and fluid containing abscess//phlegmon in the subcutaneous lateral hindfoot spanning approximately 4.4 cm craniocaudal by 2 x 1.5 cm transverse. This is positioned posterior to the peroneal tendons which are intact and not overtly involved. There is subtle loss of bony cortex/rarefaction at the lateral margin of the subjacent calcaneus concerning for osteomyelitis.     IMPRESSION: 1. Subcutaneous phlegmon/abscess lateral right hindfoot and 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 of the subjacent lateral margin of the calcaneus. Electronically signed by: Glade Reeve, MD 04/15/2024 2:16 PM EDT          Workstation ID: [id number]     XR FOOT RIGHT (2 VIEWS)  Result Date: 04/15/2024  Exam: 2 views of the right foot. Clinical indications:  PAIN. RULE OUT OSTEOMYELITIS. Right foot pain and swelling. Comparison:   04/15/2024;  Findings:  No fracture.  No dislocation. No radiopaque foreign bodies. No bone erosions.     Impression:  No fracture. No bone erosions to suggest osteomyelitis. Electronically signed by: Celine Rear,  MD 04/15/2024 12:52 AM EDT          Workstation ID: [id number]     XR FOOT LEFT (2 VIEWS)  Result Date: 04/15/2024  Exam: 2 views of the left foot. Clinical indications:  PAIN right foot. Swelling. Possible osteomyelitis. Comparison:  None Findings:  No fracture.  No dislocation. No bone erosions. No radiopaque foreign bodies.     Impression:  No fracture. Electronically signed by: Celine Rear, MD 04/15/2024 12:51 AM EDT          Workstation  ID: [id number]     XR CHEST PORTABLE  Result Date: 04/15/2024  Exam: AP portable chest Clinical indication: SEPSIS Comparison: 04/08/2024;  Results:  Left basilar opacity. No pneumothorax. Heart normal.  Mediastinal contours are normal. No free air is seen under the hemidiaphragms.   Osseous structures intact.     IMPRESSION: Left basilar opacity. Developing pneumonia versus atelectasis. Electronically signed by: Celine Rear, MD 04/15/2024 12:41 AM EDT          Workstation ID: [id number]     VL DUP LOWER EXTREMITY VENOUS BILATERAL  Result Date: 04/09/2024                                                                                                                                    Version: 1                                                                                                                                   Study ID: [id number]                                                       [facility]                                                       [address] [employer]  [place], [place]  [address]                                                      Lower Extremity Venous Duplex Report Name: AMNAH, BREUER Date: 04/08/2024, 2: 79 PM MRN: [id number]                                                               Patient Location: ER^ERWT^WT^[facility] Age: 44 Years                                                               DOB: 09/22/1951 (MM/DD/YYYY)                                                                               Gender: Female Performed By: Kylee R. Holloman, RVT                                       Account #:: [id number] Ordering Physician: [facility], AD[facility] B Reason For Study: Lower extremity pain and swelling                                                     INTERPRETATION SUMMARY 1. No evidence of deep vein thrombosis in the bilateral lower extremities.  QUALITY/PROCEDURE Complete bilateral venous duplex performed. Quality of the study is good. Location: ER. RIGHT LEG The deep venous system of the right lower extremity was examined using duplex ultrasound. B-mode imaging demonstrated normal compressibility of the right common femoral, femoral, popliteal, posterior tibial and peroneal veins. Doppler flow signals were spontaneous and phasic at all levels. RIGHT SAPHENOUS VEINS Right great saphenous vein at the saphenofemoral junction demonstrates normal compressibility with spontaneous and phasic venous hemodynamics. LEFT LEG The deep venous system of the left lower extremity was examined using duplex ultrasound. B-mode imaging demonstrated normal compressibility of the left common femoral, femoral, popliteal, posterior tibial and peroneal veins. Doppler flow signals were spontaneous and phasic at all levels. LEFT LEG SAPHENOUS VEINS Left great saphenous vein at the saphenofemoral junction demonstrates normal compressibility  with spontaneous and phasic venous hemodynamics. ______________________________________________________________________________ Electronically signed by: Dr Blenda Fear, M.D                       04/09/2024, 8: 57 AM     XR CHEST (2 VW)  Result Date: 04/08/2024  History: Shortness of breath Comparison: None Technique: PA and lateral radiographs of the chest were obtained. Two views, 2 exposures total. Findings: The lungs and pleural spaces are clear. Normal cardiomediastinal silhouette. Degenerative changes of the thoracic spine.     IMPRESSION: No acute cardiopulmonary disease. Electronically signed by: Norleen Gleason 04/08/2024 11:20 AM EDT          Workstation ID: [id number]     ECHO CARDIOGRAM COMPLETE-IN HOUSE  Result Date: 03/21/2024  CONCLUSIONS   * Left ventricular systolic function is normal with an ejection fraction of 66 % by Simpson's biplane.   * Left ventricular chamber size is normal.   * Left ventricular diastolic function:  indeterminate.   * Right ventricular systolic function is normal.   * Right ventricular chamber dimension is normal.   * There is mild aortic valve stenosis with a peak velocity of 2.8 m/s, mean gradient of 15 mmHg, and aortic valve area of 1.59 cm2.   * There is mild aortic valve regurgitation.   * Aortic valve regurgitation pressure halftime is 570 ms.   * There is trace mitral valve regurgitation.   * There is moderate tricuspid valve regurgitation.   * Moderate pulmonary hypertension, estimated pulmonary arterial systolic pressure is 51 mmHg.   * There is no pericardial effusion.   * The aortic root at the sinus of Valsalva is normal measuring 2.95 cm with an index of 1.5.   * The proximal ascending aorta is normal measuring 2.88 cm with an index of 1.4 cm/m2.   * For the indication of aortic valve calcification, findings are as noted. Comparison   * Compared to prior study from 09/14/17, changes include new mild aortic stenosis.   * Prior estimated pulmonary arterial systolic pressure was 48 mmHg.   * Trace aortic and tricuspid regurgitation increased. Patient Info Name:     Jereline Ticer Age:     35 years DOB:     September 25, 1951 Gender:     Female MRN:     [id number] Ht:     67 in Wt:     188 lb BSA:     2.03 m2 BP:     138 /     72 mmHg Heart Rhythm:     Sinus Rhythm Exam Date:     03/21/2024 2:57 PM Patient Status:     OP Exam Type:     ECHO CARDIOGRAM COMPLETE Indications     Aortic valve calcification - Left Ventricle   Left ventricular systolic function is normal with an ejection fraction of 66 % by Simpson's biplane. Left ventricular segmental wall motion is normal. Left ventricular chamber size is normal. Left ventricular diastolic function: indeterminate. Right Ventricle   Tricuspid annular plane systolic excursion (TAPSE) is normal, 2.8 cm. Right ventricular systolic function is normal. Right ventricular chamber dimension is normal. Ventricular Septum   Intact interventricular septum visualized by color  Doppler imaging. Left Atrium   Left atrial chamber is normal with a left atrial volume index biplane method of disk (BP MOD) of 24 ml/m^2. Right Atrium   Right atrial chamber size is normal. Atrial Septum   Intact interatrial septum visualized by  color Doppler imaging. Aortic Valve   The aortic valve is tricuspid. There is mild aortic valve stenosis with a peak velocity of 2.8 m/s, mean gradient of 15 mmHg, and aortic valve area of 1.59 cm2. There is mild aortic valve regurgitation. Aortic valve regurgitation pressure halftime is 570 ms. The aortic valve dimensionless index by VTI is 0.56. Left ventricular outflow tract stroke volume index is 42.14 ml/m2. Pulmonic Valve   The pulmonic valve is not well visualized. There is no pulmonic valve stenosis. There is mild pulmonic regurgitation. Mitral Valve   The mitral valve has normal leaflets. There is no mitral valve stenosis. There is trace mitral valve regurgitation. Tricuspid Valve   The tricuspid valve leaflets are normal. There is no tricuspid valve stenosis. There is moderate tricuspid valve regurgitation. Moderate pulmonary hypertension, estimated pulmonary arterial systolic pressure is 51 mmHg. Pericardium/Pleural   There is no pericardial effusion. Inferior Vena Cava   Normal inferior vena cava diameter with >50% collapse upon inspiration consistent with normal right atrial pressure, 3 mmHg. Aorta   The proximal ascending aorta is normal measuring 2.88 cm with an index of 1.4 cm/m2. The aortic root at the sinus of Valsalva is normal measuring 2.95 cm with an index of 1.5. The aortic measurements are indexed to body surface area. Left Ventricular Outflow Tract ---------------------------------------------------------------------- Name                                 Value        Normal ---------------------------------------------------------------------- LVOT 2D ---------------------------------------------------------------------- LVOT Diameter                        1.9 cm               LVOT Doppler ---------------------------------------------------------------------- LVOT Peak Velocity              124.0 cm/s               LVOT Peak Gradient                6.0 mmHg               LVOT Mean Gradient                3.0 mmHg               LVOT VTI                           30.2 cm                 LVOT Stroke Volume                 85.6 ml               LVOT Stroke Volume Index        42.1 ml/m2               LVOT Area                          2.8 cm2 Mitral Valve ---------------------------------------------------------------------- Name                                 Value        Normal ----------------------------------------------------------------------  MV Diastolic Function ---------------------------------------------------------------------- MV E Peak Velocity               83.1 cm/s               MV A Peak Velocity              117.0 cm/s               MV E/A                                 0.7       0.8-2.0 MV Decel Time PW                    232 ms               MV Annular TDI ---------------------------------------------------------------------- MV Septal e' Velocity             6.5 cm/s         >=7.0 MV E/e' (Septal)                      12.8               MV Lateral e' Velocity            7.9 cm/s        >=10.0 MV E/e' (Lateral)                     10.5               MV e' Average                     7.2 cm/s               MV E/e' (Average)                     11.7        <=14.0 Tricuspid Valve ---------------------------------------------------------------------- Name                                 Value        Normal ---------------------------------------------------------------------- TV Regurgitation Doppler ---------------------------------------------------------------------- TR Peak Velocity                345.0 cm/s       <=280.0 TR Peak Gradient                 48.0 mmHg               Estimated PAP/RSVP  ---------------------------------------------------------------------- RA Pressure                         3 mmHg            <8 PA Systolic Pressure               51 mmHg           <35 RV Systolic Pressure               51 mmHg           <36 Aorta ---------------------------------------------------------------------- Name  Value        Normal ---------------------------------------------------------------------- Ascending Aorta ---------------------------------------------------------------------- Sinus of Valsalva Diameter          3.0 cm               Prox Asc Ao Diameter                2.9 cm         <=3.1 Prox Asc Ao Diameter Index       1.4 cm/m2         <=1.9 Venous ---------------------------------------------------------------------- Name                                 Value        Normal ---------------------------------------------------------------------- IVC/SVC ---------------------------------------------------------------------- IVC Diameter (Exp 2D)               1.9 cm         <=2.1 Aortic Valve ---------------------------------------------------------------------- Name                                 Value        Normal ---------------------------------------------------------------------- AV Doppler ---------------------------------------------------------------------- AV Peak Velocity                   2.8 m/s               AV Peak Gradient                 30.0 mmHg               AV Mean Gradient                 15.0 mmHg               AV VTI                             54.0 cm               AV Area (Cont Eq VTI)              1.6 cm2         >=3.0 AV Area Index (Cont Eq VTI)     0.8 cm2/m2               AV Area (Cont Eq Vel)              1.3 cm2               AV Area Index (Cont Eq Vel)     0.6 cm2/m2                AV Dimensionless Index (Peak Velocity)                        0.4                  AV Dimensionless Index (VTI)                                  0.6                AV Regurgitation 2D ---------------------------------------------------------------------- LVOT Area  2.8 cm2               AV Regurgitation Doppler ---------------------------------------------------------------------- AR Decel Slope                  192.0 cm/s2               AR PHT                              570 ms Ventricles ---------------------------------------------------------------------- Name                                 Value        Normal ---------------------------------------------------------------------- LV Dimensions 2D/MM ----------------------------------------------------------------------  IVS Diastolic Thickness (2D)                                1.3 cm       0.5-0.9 LVID Diastole (2D)                  4.5 cm       3.8-5.2  LVPW Diastolic Thickness (2D)                                1.3 cm       0.5-0.9 LVID Systole (2D)                   3.1 cm       2.2-3.5 LVID Diastolic Index (2D)        2.2 cm/m2       2.3-3.1 LVOT Diameter                       1.9 cm               LV Fractional Shortening/Ejection Fraction 2D/MM ---------------------------------------------------------------------- LV EF (2D Teichholz)                  58 %               LV EF (BP MOD)                        66 %         54-74  LV Fractional Shortening (2D)                                32.9 %     27.0-45.0  LV Diastolic Volume Index (BP MOD)                        24.7 ml/m2     29.0-61.0 LV SV (2C MOD)                     28.0 ml               LV SV (BP MOD)                     33.3 ml               LV SV (Cubed)  60.3 ml               LV SV (Teich)                      53.5 ml               LV EDV (Cubed)                     90.9 ml               LV ESV (Cubed)                     30.6 ml               LV EDV (Teich)                     92.2 ml               LV ESV (Teich)                     38.8 ml               RV Dimensions 2D/MM  ----------------------------------------------------------------------  RV Basal Diastolic Dimension                           3.2 cm       2.5-4.1 TAPSE                               2.8 cm         >=1.7 TAPSV                            10.2 cm/s               RV/LV Diam Ratio (Diastole)            0.9 Atria ---------------------------------------------------------------------- Name                                 Value        Normal ---------------------------------------------------------------------- LA Dimensions ---------------------------------------------------------------------- LA Dimension (2D)                   3.0 cm       2.7-3.8 LA Dimen Index (2D)              1.5 cm/m2               LA Volume (BP A-L)                 49.6 ml               LA Volume Index (BP A-L)        24.4 ml/m2         <35.0 LA Volume Index (BP MOD)        23.5 ml/m2        <=34.0 RA Dimensions ----------------------------------------------------------------------  RA Diastolic Major Axis Length (4C)                         4.4 cm  RA Diastolic Major Axis Length Index (4C)                      0.2               RA Area (4C)                      14.4 cm2         <21.0 RA ESV Index (4C MOD)           19.3 ml/m2     15.0-27.0 Wall Motion Scoring Wall Motion Scoring Index:     1.00 Technical Quality:     Fair Complete transthoracic echocardiogram performed with 2D imaging, color Doppler, and spectral Doppler. Staff Referring Provider:     Garnette To Ordering Provider:     Glendia DELENA Marina MD Sonographer:     Luke ONEIDA Finder RDCS [id number] Report Signatures Finalized by Glendia DELENA Marina  MD on 03/21/2024 03:44 PM      Microbiology Results:  Results       Procedure Component Value Units Date/Time    Culture, Urine [[id number]] Collected: 04/15/24 0300    Order Status: Sent Specimen: Urine Updated: 04/15/24 0322    Culture, Wound W Gram Stain [[id number]] Collected: 04/15/24 0036    Order Status: Completed Specimen:  Foot, Right Updated: 04/15/24 0622     Gram Stain Result <10 WBC's/lpf  No Organisms Seen       Culture, Blood 1 [[id number]] Collected: 04/14/24 2150    Order Status: Completed Specimen: Blood Updated: 04/16/24 0706     BLOOD CULTURE RESULT No Growth At 24 Hours       Culture, Blood 2 [[id number]] Collected: 04/14/24 2150    Order Status: Completed Specimen: Blood Updated: 04/16/24 0706     BLOOD CULTURE RESULT No Growth At 24 Hours       Narrative:      Source:->Blood            Pathology Results:      Assessment:     Principal Problem:    Foot abscess  Resolved Problems:    * No resolved hospital problems. *      Plan:     Ct reviewed; area of concern is the packing/ dressing to the region,      - cont w/ local wound care, ordered serial packing changes with some dakins  - drainage with some mal odor despite NGTD, planning I&d  - pvl indicates poor circulation, will need vascular consult or site unlikely to heal.  This is probable reason for b/l le pain  - npo placed        Adine DELENA Caster, DPM  Fellowship Trained Foot & Ankle Surgeon  [facility]  [facility]  Pager [phone]  Office [phone]     April 16, 2024

## 2024-04-16 NOTE — Progress Notes (Signed)
 Brief update note:   Patient scheduled for I&D with wound VAC application tomorrow at 2 PM 04/17/2024   NPO past midnight   Thank you

## 2024-04-16 NOTE — Progress Notes (Signed)
 INFECTIOUS DISEASE FOLLOW UP NOTE     Date of admission: 04/14/2024     Date of consult: April 15, 2024        ABX:      Current abx Prior abx    Cefepime /Vanco 7/14-2  Flagyl  7/15-1        ASSESSMENT:       Right foot abscess  -Unclear cause of swelling and pain that has been present for several months  -Has been evaluated by orthopedic  -Patient seen by [facility] specialist Dr. Curtistine Emperor with last visit 03/11/2024 for right contusion of dorsum of foot, lower extremity edema and right foot pronation deformity  -03/08/2024 MRI right hindfoot no fracture.  Mild multifocal osseous edema?  Altered gait.  And mechanical stress-related changes.  Mild intrinsic foot muscular atrophic and intramuscular edema.  Subcu edema along the dorsum of the foot  -xray no fracture  -S/p I&D by ED pending cultures   - Patient seen by podiatrist Dr. Roberts   - Awaiting CT foot    Left foot swelling  -Xray no fracture  -Low threshold for   Leucocytosis POA   AKI/CKD  -Abnormal creatinine few weeks ago on routine blood work at [employer]   PAD  -Patient has been following with vascular surgeon and last seen in office on 04/08/2024  -12/2023 ABI at 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 and following with [facility] neurosurgery with plan for continued management   Ax- PCN -as a child   Co-morb- COPD, schizoaffective disorder, HTN,  depression,             RECOMMENDATIONS:      Complicated 73 year old female with significant comorbidities coming with bilateral feet swelling, right worse than left    - Patient afebrile, stable vitals, not requiring submental oxygen  -WBC 15.8, Hb 9.2, PLT 328, BUN 23, CR 1.07, CRP 252, ESR 110  -Micro 7/15 urine culture less than 10K skin and genital contamination, wound culture IP, 7/14 blood culture x 2 IP  -Imaging 7/15 retroperitoneal ultrasound unremarkable except partially exophytic simple appearing interpolar left renal cyst CT right foot subcu phlegmon/abscess lateral right hindfoot and 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 of the subjacent lateral margin of the calcaneus     Assessment     Right foot abscess  - Coming with swelling, pain of right foot particular involving he  - Understand I&D in ED by provider and cultures in progress and negative so far  - CT reviewed and podiatry planning I&D later today  - Anticipate deep cultures and path  - Continue with local wound care, limb elevation and broad-spectrum antibiotic for now     Left foot swelling  - Tenderness on dorsal aspect  - Pulses palpable to weak  - No obvious ulcer or drainage  - Continue with elevation  - Await Dopplers     Bilateral leg edema  - Recent PVL 7/8 was negative for DVT but low threshold to repeat     Leukocytosis likely reactive and monitor     AKI?  CKD  - Avoid  nephrotoxic medications and dose for current creatinine clearance  - Renal ultrasound without any stone or obstructionOther details as above        Plan  -Continue IV Vanco, cefepime  and Flagyl  for now  - Await surgical drainage and anticipate deep cultures and path  -Local wound care per others  -Await vascular studies  - Keep legs elevated on pillows  - Monitor serial lab including inflammatory markers  - Monitor for any ADE  - Remains at risk for worsening  - Plan discussed with Dr. Olam Campi                       MICROBIOLOGY:      7/14 Blcx x2 IP              Wd cx IP  7/15  Ucx IP     LINES AND CATHETERS:      PIV     SUMMARY:      Susan [name] is a 73 year old African-American female with past medical history of COPD, schizoaffective disorder, hypertension, depression, PAD, peripheral neuropathy was admitted on 7/14 with worsening leg pain.     Patient is not a good historian and says lives with her daughter.  She has been almost bedbound for quite some time. Pt has been following with [facility] for basilar artery aneurysm and conservative Mx.  Patient also developed right leg swelling for which she was referred to Ortho and eventually podiatry.  It was not thought to be the part of her back leading to right leg swelling and neuropathy.  Patient also following with vascular and had procedure done in March with good runoff and did not think was concern for pain and swelling.  Patient last seen by podiatry 03/11/2024 and plan for conservative management.      She was seen in ED on 8 July with leg swelling.  Workup showed negative PVL for DVT and chest x-ray without any significant finding.  She was noted with pitting edema and was given some Lasix  and asked to follow-up with PCP for adjustment in medications.  Patient returned 7/14 with worsening swelling of the legs, right worse than left.  Workup showed stable vitals, afebrile state.  WBC elevated at 19, Hb 9.8, PLT 354, BUN 34, CR 1.43, AST 17, ALT 14.  Blood cultures were obtained.  Patient had bilateral feet x-ray which showed no fracture or concern for osteo.  Chest x-ray was suggestive of left basilar opacity with developing pneumonia versus atelectasis.  Patient had I&D in the ED.  Podiatry was consulted and CT was ordered.  Wound culture in progress.  Patient on broad-spectrum antibiotic with cefepime  and Vanco.  ID was asked for further evaluation management.       SUBJECTIVE :     Interval notes reviewed.  Patient afebrile.  Remains with pain in her foot.  Plan for surgery based on CT discussed with her.  No family at  bedside.    OBJECTIVE     BP 112/75   Pulse 73   Temp 98.6 F (37 C) (Oral)   Resp 10   Ht 1.702 m (5' 7)   Wt 81.6 kg (180 lb)   SpO2 100%   BMI 28.19 kg/m     Temp (24hrs), Avg:98.4 F (36.9 C), Min:98.1 F (36.7 C), Max:98.9 F (37.2 C)    General: Fairly developed and nourished 73 y.o. year-old, female, in no acute distress and chronically sick appearing  HEENT:  Normocephalic, anicteric sclerae, Pupils equal, round reactive to light, no oropharyngeal lesions. No sinus tenderness.  Neck: Supple, no lymphadenopathy, masses or thyromegaly  Chest: Symmetrical expansion  Lungs: Clear to auscultation bilaterally, no dullness  Heart: Regular rhythm, no murmur, no rub or gallop, No JVD  Abdomen: Soft, non-tender,non distended, no organomegaly, BS+  Musculoskeletal: Bilateral legs with edema.  Right foot currently under dressing which was not removed.  Edema noted on the dorsal aspect of the left foot and tender on deep palpation.  Feet are warm though difficult to palpate pulses  CNS: AAOx3.  Follows simple commands.  SKIN: No obvious   skin lesion or rash. Dry, warm, intact         MEDICATIONS:     Current Facility-Administered Medications   Medication Dose Route Frequency Provider Last Rate Last Admin    senna (SENOKOT) tablet 8.6 mg  1 tablet Oral Daily PRN Matriano, Norleen Deward DEL, MD        docusate sodium  (COLACE) capsule 100 mg  100 mg Oral BID PRN Matriano, Norleen Deward DEL, MD        ondansetron  (ZOFRAN ) injection 4 mg  4 mg IntraVENous Q6H PRN Matriano, Norleen Deward DEL, MD        acetaminophen  (TYLENOL ) tablet 650 mg  650 mg Oral Q6H PRN Matriano, John Paul H, MD        melatonin tablet 3 mg  3 mg Oral Nightly PRN Matriano, Norleen Deward DEL, MD        naloxone  (NARCAN ) injection 0.4 mg  0.4 mg IntraVENous PRN Matriano, Norleen Deward DEL, MD        glucagon  injection 1 mg  1 mg IntraMUSCular PRN Matriano, Norleen Deward DEL, MD        dextrose  50 % IV solution  20-30 mL IntraVENous PRN Matriano, Norleen Deward DEL, MD        heparin   (porcine) injection 5,000 Units  5,000 Units SubCUTAneous BID Matriano, Norleen Deward DEL, MD        albuterol  (ACCUNEB ) nebulizer solution 2.5 mg  2.5 mg Nebulization Q4H PRN Matriano, Norleen Deward DEL, MD        benzonatate  (TESSALON ) capsule 100 mg  100 mg Oral TID PRN Matriano, Norleen Deward DEL, MD        cefepime  (MAXIPIME ) 1,000 mg in sodium chloride  0.9 % 50 mL IVPB (mini-bag)  1,000 mg IntraVENous Q12H Matriano, Norleen Deward DEL, MD   Stopped at 04/16/24 0113    vancomycin  (VANCOCIN ) 1250 mg in sodium chloride  0.9% 250 mL IVPB  1,250 mg IntraVENous Q24H Matriano, Norleen Deward DEL, MD   Stopped at 04/16/24 0257    [START ON 04/17/2024] Vancomycin  Trough Due  1 each Other Once Matriano, Norleen Deward DEL, MD        *Pharmacy to Dose Vancomycin   1 each Other RX Placeholder Matriano, Norleen Deward DEL, MD        morphine  (PF) injection 1 mg  1 mg IntraVENous Q3H PRN Matriano, John Paul H, MD   1 mg at 04/15/24 1412    ipratropium 0.5 mg-albuterol  2.5 mg (DUONEB ) nebulizer solution 1 Dose  1 Dose Inhalation TID RT Josepha Planas, DO   1 Dose at 04/15/24 2128    sodium hypochlorite (DAKINS) 0.125 % external solution   Irrigation Daily Anastasio Cain A, DPM        metroNIDAZOLE  (FLAGYL ) 500 mg in 0.9% NaCl 100 mL IVPB premix  500 mg IntraVENous Q8H Broghan Pannone S, MD  100 mL/hr at 04/16/24 0601 500 mg at 04/16/24 0601    oxyCODONE -acetaminophen  (PERCOCET ) 7.5-325 MG per tablet 1 tablet  1 tablet Oral Q4H PRN Josepha Planas, DO   1 tablet at 04/15/24 2133     Current Outpatient Medications   Medication Sig Dispense Refill    furosemide  (LASIX ) 40 MG tablet Take 1 tablet by mouth daily      carvedilol (COREG) 12.5 MG tablet Take 1 tablet by mouth 2 times daily (with meals)      atorvastatin (LIPITOR) 80 MG tablet Take 1 tablet by mouth nightly      GEMTESA 75 MG TABS tablet Take 1 tablet by mouth daily      VRAYLAR 1.5 MG capsule Take 1 capsule by mouth daily      clopidogrel (PLAVIX) 75 MG tablet Take 1 tablet by mouth daily      QUEtiapine (SEROQUEL) 100 MG  tablet Take 1 tablet by mouth daily      pantoprazole (PROTONIX) 40 MG tablet Take 1 tablet by mouth daily      potassium chloride (KLOR-CON) 10 MEQ extended release tablet Take 1 tablet by mouth daily with food      valsartan (DIOVAN) 160 MG tablet Take 1 tablet by mouth 2 times daily      MYRBETRIQ 25 MG TB24 Take 1 tablet by mouth daily      TRELEGY ELLIPTA 200-62.5-25 MCG/ACT AEPB inhaler Inhale 1 puff into the lungs daily           Labs: Results:   Chemistry Recent Labs     04/14/24  1956   NA 140   K 4.2   CL 101   CO2 29   BUN 34*      CBC w/Diff Recent Labs     04/14/24  1956 04/16/24  0615   WBC 19.2* 15.8*   RBC 3.37* 3.14*   HGB 9.8* 9.2*   HCT 31.4* 31.0*   PLT 354 328          RADIOLOGY :        Imaging  US  RETROPERITONEAL COMPLETE  Result Date: 04/15/2024  INDICATION: Acute renal failure. COMPARISON: None. TECHNIQUE: Transabdominal grayscale and duplex sonography of the retroperitoneum performed. FINDINGS: RIGHT KIDNEY:  10.7 cm in length. (Normal range 9-13 cm) Normal renal parenchymal echogenicity. No obstruction, renal calculi or focal lesion. LEFT KIDNEY: 9.8 cm in length. (Normal range 9-13 cm) Normal renal parenchymal echogenicity.  And interpolar simple-appearing partially exophytic left renal cyst measures 2.6 x 2.6 x 2.1 cm. URINARY BLADDER: Bladder has a pre-void volume of 240.0 mL. (<50 mL WNL) Patient did not void for the exam. Neither ureteral jet(s) identified. No urinary bladder wall thickening or debris within the urinary bladder. (Normal wall thickness <3 mm distended, <5 mm underdistended.) Bladder contains a Foley catheter and is decompressed. PANCREAS:  Visualized pancreas is within normal limits. IVC: Patent. ABDOMINAL AORTA: Normal caliber abdominal aorta. COMMON ILIAC ARTERIES: Suboptimally visualized secondary to interposed bowel gas.     IMPRESSION: 1.  No hydronephrosis. Kidneys unremarkable sonographically with the exception of a partially exophytic simple-appearing interpolar  left renal cyst. 2.  Urinary bladder unremarkable sonographically. 3.  Common iliac arteries above the visualized sonographically due to interposed bowel gas Electronically signed by: Alm Conte, MD 04/15/2024 8:34 PM EDT          Workstation ID: [id number]     CT FOOT RIGHT W CONTRAST  Result Date: 04/15/2024  Indication: Right foot infection.  Evaluate for abscess. Comparison: None available Technique: Computed tomographic imaging of the right foot was performed following IV contrast administration. Coronal and sagittal reconstructions were created from the initial axial data set and submitted for interpretation. Findings: Complex gas and fluid containing abscess//phlegmon in the subcutaneous lateral hindfoot spanning approximately 4.4 cm craniocaudal by 2 x 1.5 cm transverse. This is positioned posterior to the peroneal tendons which are intact and not overtly involved. There is subtle loss of bony cortex/rarefaction at the lateral margin of the subjacent calcaneus concerning for osteomyelitis.     IMPRESSION: 1. Subcutaneous phlegmon/abscess lateral right hindfoot and 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 of the subjacent lateral margin of the calcaneus. Electronically signed by: Glade Reeve, MD 04/15/2024 2:16 PM EDT          Workstation ID: [id number]     XR FOOT RIGHT (2 VIEWS)  Result Date: 04/15/2024  Exam: 2 views of the right foot. Clinical indications:  PAIN. RULE OUT OSTEOMYELITIS. Right foot pain and swelling. Comparison:   04/15/2024;  Findings:  No fracture.  No dislocation. No radiopaque foreign bodies. No bone erosions.     Impression:  No fracture. No bone erosions to suggest osteomyelitis. Electronically signed by: Celine Rear, MD 04/15/2024 12:52 AM EDT          Workstation ID: [id number]     XR FOOT LEFT (2 VIEWS)  Result Date: 04/15/2024  Exam: 2 views of the left foot. Clinical indications:  PAIN right foot. Swelling. Possible osteomyelitis. Comparison:  None Findings:  No fracture.  No  dislocation. No bone erosions. No radiopaque foreign bodies.     Impression:  No fracture. Electronically signed by: Celine Rear, MD 04/15/2024 12:51 AM EDT          Workstation ID: [id number]     XR CHEST PORTABLE  Result Date: 04/15/2024  Exam: AP portable chest Clinical indication: SEPSIS Comparison: 04/08/2024;  Results:  Left basilar opacity. No pneumothorax. Heart normal.  Mediastinal contours are normal. No free air is seen under the hemidiaphragms.   Osseous structures intact.     IMPRESSION: Left basilar opacity. Developing pneumonia versus atelectasis. Electronically signed by: Celine Rear, MD 04/15/2024 12:41 AM EDT          Workstation ID: [id number]     VL DUP LOWER EXTREMITY VENOUS BILATERAL  Result Date: 04/09/2024                                                                                                                                    Version: 1  Study ID: [id number]                                                       [facility]                                                       [address] [place], [place]  [address]                                                      Lower Extremity Venous Duplex Report Name: Susan [name], Susan [name] Date: 04/08/2024, 2: 39 PM MRN: [id number]                                                               Patient Location: ER^ERWT^WT^[facility] Age: 80 Years                                                               DOB: 04/17/1951 (MM/DD/YYYY)                                                                               Gender: Female Performed By: Kylee R. Holloman, RVT                                       Account #:: [id number] Ordering Physician: [facility], [facility] Reason For Study: Lower  extremity pain and swelling  INTERPRETATION SUMMARY 1. No evidence of deep vein thrombosis in the bilateral lower extremities. QUALITY/PROCEDURE Complete bilateral venous duplex performed. Quality of the study is good. Location: ER. RIGHT LEG The deep venous system of the right lower extremity was examined using duplex ultrasound. B-mode imaging demonstrated normal compressibility of the right common femoral, femoral, popliteal, posterior tibial and peroneal veins. Doppler flow signals were spontaneous and phasic at all levels. RIGHT SAPHENOUS VEINS Right great saphenous vein at the saphenofemoral junction demonstrates normal compressibility with spontaneous and phasic venous hemodynamics. LEFT LEG The deep venous system of the left lower extremity was examined using duplex ultrasound. B-mode imaging demonstrated normal compressibility of the left common femoral, femoral, popliteal, posterior tibial and peroneal veins. Doppler flow signals were spontaneous and phasic at all levels. LEFT LEG SAPHENOUS VEINS Left great saphenous vein at the saphenofemoral junction demonstrates normal compressibility with spontaneous and phasic venous hemodynamics. ______________________________________________________________________________ Electronically signed by: Dr Blenda Fear, M.D                       04/09/2024, 8: 57 AM     XR CHEST (2 VW)  Result Date: 04/08/2024  History: Shortness of breath Comparison: None Technique: PA and lateral radiographs of the chest were obtained. Two views, 2 exposures total. Findings: The lungs and pleural spaces are clear. Normal cardiomediastinal silhouette. Degenerative changes of the thoracic spine.     IMPRESSION: No acute cardiopulmonary disease. Electronically signed by: Norleen Gleason 04/08/2024 11:20 AM EDT          Workstation ID: [id number]     ECHO CARDIOGRAM COMPLETE-IN HOUSE  Result Date: 03/21/2024  CONCLUSIONS   * Left ventricular  systolic function is normal with an ejection fraction of 66 % by Simpson's biplane.   * Left ventricular chamber size is normal.   * Left ventricular diastolic function: indeterminate.   * Right ventricular systolic function is normal.   * Right ventricular chamber dimension is normal.   * There is mild aortic valve stenosis with a peak velocity of 2.8 m/s, mean gradient of 15 mmHg, and aortic valve area of 1.59 cm2.   * There is mild aortic valve regurgitation.   * Aortic valve regurgitation pressure halftime is 570 ms.   * There is trace mitral valve regurgitation.   * There is moderate tricuspid valve regurgitation.   * Moderate pulmonary hypertension, estimated pulmonary arterial systolic pressure is 51 mmHg.   * There is no pericardial effusion.   * The aortic root at the sinus of Valsalva is normal measuring 2.95 cm with an index of 1.5.   * The proximal ascending aorta is normal measuring 2.88 cm with an index of 1.4 cm/m2.   * For the indication of aortic valve calcification, findings are as noted. Comparison   * Compared to prior study from 09/14/17, changes include new mild aortic stenosis.   * Prior estimated pulmonary arterial systolic pressure was 48 mmHg.   * Trace aortic and tricuspid regurgitation increased. Patient Info Name:     Susan [name] Age:     23 years DOB:     November 09, 1950 Gender:     Female MRN:     [id number] Ht:     67 in Wt:     188 lb BSA:     2.03 m2 BP:     138 /     72 mmHg Heart Rhythm:     Sinus Rhythm Exam Date:  03/21/2024 2:57 PM Patient Status:     OP Exam Type:     ECHO CARDIOGRAM COMPLETE Indications     Aortic valve calcification - Left Ventricle   Left ventricular systolic function is normal with an ejection fraction of 66 % by Simpson's biplane. Left ventricular segmental wall motion is normal. Left ventricular chamber size is normal. Left ventricular diastolic function: indeterminate. Right Ventricle   Tricuspid annular plane systolic excursion (TAPSE) is normal, 2.8  cm. Right ventricular systolic function is normal. Right ventricular chamber dimension is normal. Ventricular Septum   Intact interventricular septum visualized by color Doppler imaging. Left Atrium   Left atrial chamber is normal with a left atrial volume index biplane method of disk (BP MOD) of 24 ml/m^2. Right Atrium   Right atrial chamber size is normal. Atrial Septum   Intact interatrial septum visualized by color Doppler imaging. Aortic Valve   The aortic valve is tricuspid. There is mild aortic valve stenosis with a peak velocity of 2.8 m/s, mean gradient of 15 mmHg, and aortic valve area of 1.59 cm2. There is mild aortic valve regurgitation. Aortic valve regurgitation pressure halftime is 570 ms. The aortic valve dimensionless index by VTI is 0.56. Left ventricular outflow tract stroke volume index is 42.14 ml/m2. Pulmonic Valve   The pulmonic valve is not well visualized. There is no pulmonic valve stenosis. There is mild pulmonic regurgitation. Mitral Valve   The mitral valve has normal leaflets. There is no mitral valve stenosis. There is trace mitral valve regurgitation. Tricuspid Valve   The tricuspid valve leaflets are normal. There is no tricuspid valve stenosis. There is moderate tricuspid valve regurgitation. Moderate pulmonary hypertension, estimated pulmonary arterial systolic pressure is 51 mmHg. Pericardium/Pleural   There is no pericardial effusion. Inferior Vena Cava   Normal inferior vena cava diameter with >50% collapse upon inspiration consistent with normal right atrial pressure, 3 mmHg. Aorta   The proximal ascending aorta is normal measuring 2.88 cm with an index of 1.4 cm/m2. The aortic root at the sinus of Valsalva is normal measuring 2.95 cm with an index of 1.5. The aortic measurements are indexed to body surface area. Left Ventricular Outflow Tract ---------------------------------------------------------------------- Name                                 Value        Normal  ---------------------------------------------------------------------- LVOT 2D ---------------------------------------------------------------------- LVOT Diameter                       1.9 cm               LVOT Doppler ---------------------------------------------------------------------- LVOT Peak Velocity              124.0 cm/s               LVOT Peak Gradient                6.0 mmHg               LVOT Mean Gradient                3.0 mmHg               LVOT VTI                           30.2 cm  LVOT Stroke Volume                 85.6 ml               LVOT Stroke Volume Index        42.1 ml/m2               LVOT Area                          2.8 cm2 Mitral Valve ---------------------------------------------------------------------- Name                                 Value        Normal ---------------------------------------------------------------------- MV Diastolic Function ---------------------------------------------------------------------- MV E Peak Velocity               83.1 cm/s               MV A Peak Velocity              117.0 cm/s               MV E/A                                 0.7       0.8-2.0 MV Decel Time PW                    232 ms               MV Annular TDI ---------------------------------------------------------------------- MV Septal e' Velocity             6.5 cm/s         >=7.0 MV E/e' (Septal)                      12.8               MV Lateral e' Velocity            7.9 cm/s        >=10.0 MV E/e' (Lateral)                     10.5               MV e' Average                     7.2 cm/s               MV E/e' (Average)                     11.7        <=14.0 Tricuspid Valve ---------------------------------------------------------------------- Name                                 Value        Normal ---------------------------------------------------------------------- TV Regurgitation Doppler ----------------------------------------------------------------------  TR Peak Velocity                345.0 cm/s       <=280.0 TR Peak Gradient                 48.0 mmHg  Estimated PAP/RSVP ---------------------------------------------------------------------- RA Pressure                         3 mmHg            <8 PA Systolic Pressure               51 mmHg           <35 RV Systolic Pressure               51 mmHg           <36 Aorta ---------------------------------------------------------------------- Name                                 Value        Normal ---------------------------------------------------------------------- Ascending Aorta ---------------------------------------------------------------------- Sinus of Valsalva Diameter          3.0 cm               Prox Asc Ao Diameter                2.9 cm         <=3.1 Prox Asc Ao Diameter Index       1.4 cm/m2         <=1.9 Venous ---------------------------------------------------------------------- Name                                 Value        Normal ---------------------------------------------------------------------- IVC/SVC ---------------------------------------------------------------------- IVC Diameter (Exp 2D)               1.9 cm         <=2.1 Aortic Valve ---------------------------------------------------------------------- Name                                 Value        Normal ---------------------------------------------------------------------- AV Doppler ---------------------------------------------------------------------- AV Peak Velocity                   2.8 m/s               AV Peak Gradient                 30.0 mmHg               AV Mean Gradient                 15.0 mmHg               AV VTI                             54.0 cm               AV Area (Cont Eq VTI)              1.6 cm2         >=3.0 AV Area Index (Cont Eq VTI)     0.8 cm2/m2               AV Area (Cont Eq Vel)              1.3 cm2               AV  Area Index (Cont Eq Vel)     0.6 cm2/m2                AV Dimensionless  Index (Peak Velocity)                        0.4                  AV Dimensionless Index (VTI)                                  0.6               AV Regurgitation 2D ---------------------------------------------------------------------- LVOT Area                          2.8 cm2               AV Regurgitation Doppler ---------------------------------------------------------------------- AR Decel Slope                  192.0 cm/s2               AR PHT                              570 ms Ventricles ---------------------------------------------------------------------- Name                                 Value        Normal ---------------------------------------------------------------------- LV Dimensions 2D/MM ----------------------------------------------------------------------  IVS Diastolic Thickness (2D)                                1.3 cm       0.5-0.9 LVID Diastole (2D)                  4.5 cm       3.8-5.2  LVPW Diastolic Thickness (2D)                                1.3 cm       0.5-0.9 LVID Systole (2D)                   3.1 cm       2.2-3.5 LVID Diastolic Index (2D)        2.2 cm/m2       2.3-3.1 LVOT Diameter                       1.9 cm               LV Fractional Shortening/Ejection Fraction 2D/MM ---------------------------------------------------------------------- LV EF (2D Teichholz)                  58 %               LV EF (BP MOD)                        66 %         54-74  LV Fractional Shortening (2D)  32.9 %     27.0-45.0  LV Diastolic Volume Index (BP MOD)                        24.7 ml/m2     29.0-61.0 LV SV (2C MOD)                     28.0 ml               LV SV (BP MOD)                     33.3 ml               LV SV (Cubed)                      60.3 ml               LV SV (Teich)                      53.5 ml               LV EDV (Cubed)                     90.9 ml               LV ESV (Cubed)                     30.6 ml               LV EDV (Teich)                      92.2 ml               LV ESV (Teich)                     38.8 ml               RV Dimensions 2D/MM ----------------------------------------------------------------------  RV Basal Diastolic Dimension                           3.2 cm       2.5-4.1 TAPSE                               2.8 cm         >=1.7 TAPSV                            10.2 cm/s               RV/LV Diam Ratio (Diastole)            0.9 Atria ---------------------------------------------------------------------- Name                                 Value        Normal ---------------------------------------------------------------------- LA Dimensions ---------------------------------------------------------------------- LA Dimension (2D)                   3.0 cm       2.7-3.8 LA Dimen Index (2D)  1.5 cm/m2               LA Volume (BP A-L)                 49.6 ml               LA Volume Index (BP A-L)        24.4 ml/m2         <35.0 LA Volume Index (BP MOD)        23.5 ml/m2        <=34.0 RA Dimensions ----------------------------------------------------------------------  RA Diastolic Major Axis Length (4C)                         4.4 cm                RA Diastolic Major Axis Length Index (4C)                      0.2               RA Area (4C)                      14.4 cm2         <21.0 RA ESV Index (4C MOD)           19.3 ml/m2     15.0-27.0 Wall Motion Scoring Wall Motion Scoring Index:     1.00 Technical Quality:     Fair Complete transthoracic echocardiogram performed with 2D imaging, color Doppler, and spectral Doppler. Staff Referring Provider:     Garnette To Ordering Provider:     Glendia DELENA Marina MD Sonographer:     Luke ONEIDA Finder RDCS [id number] Report Signatures Finalized by Glendia DELENA Marina  MD on 03/21/2024 03:44 PM       I have independently reviewed lab studies and imgaing as well as review of nursing notes and physican notes from the past 24 hours.    Dragon medical dictation software was used for portions  of this report. Unintended errors may occur.     VIVIANE GORMAN GORE, MD  April 16, 2024    [facility]   Office [phone]  [phone]

## 2024-04-16 NOTE — ED Notes (Signed)
 TRANSFER - OUT REPORT:    Verbal report given to [employer] on Susan [name] being transferred to 5212 for routine progression of patient care       Report consisted of patient's Situation, Background, Assessment and   Recommendations(SBAR).     Information from the following report(s) Nurse Handoff Report, ED SBAR, and [facility] was reviewed with the receiving nurse.  Kinder Assessment: Presents to emergency department  because of falls (Syncope, seizure, or loss of consciousness): No, Age > 70: Yes, Altered Mental Status, Intoxication with alcohol or substance confusion (Disorientation, impaired judgment, poor safety awaremess, or inability to follow instructions): No, Impaired Mobility: Ambulates or transfers with assistive devices or assistance; Unable to ambulate or transer.: Yes, Nursing Judgement: Yes  Lines:   Peripheral IV 04/14/24 Left Antecubital (Active)   Site Assessment Clean, dry & intact 04/14/24 1956   Line Status Blood return noted;Normal saline locked;Flushed 04/14/24 1956   Phlebitis Assessment No symptoms 04/14/24 1956   Infiltration Assessment 0 04/14/24 1956       Peripheral IV 04/14/24 Proximal;Right Antecubital (Active)       Peripheral IV 04/14/24 Left Forearm (Active)   Site Assessment Clean, dry & intact 04/14/24 2205   Line Status Normal saline locked 04/14/24 2205   Phlebitis Assessment No symptoms 04/14/24 2205   Infiltration Assessment 0 04/14/24 2205   Alcohol Cap Used Yes 04/14/24 2205   Dressing Status Clean, dry & intact 04/14/24 2205   Dressing Type Transparent 04/14/24 2205      Medications sent with patient from pharmacy: Yes  Patient has medications that were sent to the ED during this visit; these medications must be collected and transferred with the patient to the receiving unit on admission.   Patient belongings: Belongings sent to floor    Opportunity for questions and clarification was provided.      Patient transported with:  O2 @ 2lpm and [address]          Verdon Dorothe [place],  [place]  04/16/24 [phone]

## 2024-04-17 ENCOUNTER — Inpatient Hospital Stay: Admit: 2024-04-17 | Payer: Medicare (Managed Care) | Primary: Family Medicine

## 2024-04-17 LAB — ABO/RH: ABO/Rh: A POS

## 2024-04-17 LAB — ANTIBODY SCREEN: Antibody Screen: NEGATIVE

## 2024-04-17 MED ORDER — KETAMINE HCL 50 MG/ML IV SOSY
50 | Freq: Once | INTRAVENOUS | Status: DC | PRN
Start: 2024-04-17 — End: 2024-04-17
  Administered 2024-04-17: 17:00:00 10 via INTRAVENOUS
  Administered 2024-04-17: 17:00:00 30 via INTRAVENOUS
  Administered 2024-04-17: 17:00:00 10 via INTRAVENOUS

## 2024-04-17 MED ORDER — LIDOCAINE HCL 1 % IJ SOLN
1 | INTRAMUSCULAR | Status: AC
Start: 2024-04-17 — End: ?

## 2024-04-17 MED ORDER — MIDAZOLAM HCL 2 MG/2ML IJ SOLN
2 | Freq: Once | INTRAMUSCULAR | Status: DC | PRN
Start: 2024-04-17 — End: 2024-04-17
  Administered 2024-04-17 (×2): 1 via INTRAVENOUS

## 2024-04-17 MED ORDER — DIPHENHYDRAMINE HCL 50 MG/ML IJ SOLN
50 | Freq: Once | INTRAMUSCULAR | Status: DC | PRN
Start: 2024-04-17 — End: 2024-04-17

## 2024-04-17 MED ORDER — NORMAL SALINE FLUSH 0.9 % IV SOLN
0.9 | INTRAVENOUS | Status: DC | PRN
Start: 2024-04-17 — End: 2024-04-17

## 2024-04-17 MED ORDER — OXYCODONE HCL 5 MG PO TABS
5 | ORAL | Status: DC | PRN
Start: 2024-04-17 — End: 2024-04-17

## 2024-04-17 MED ORDER — LACTATED RINGERS IV SOLN
INTRAVENOUS | Status: DC
Start: 2024-04-17 — End: 2024-04-17

## 2024-04-17 MED ORDER — FENTANYL CITRATE (PF) 100 MCG/2ML IJ SOLN
100 | Freq: Once | INTRAMUSCULAR | Status: DC | PRN
Start: 2024-04-17 — End: 2024-04-17
  Administered 2024-04-17 (×4): 25 via INTRAVENOUS

## 2024-04-17 MED ORDER — HYDROMORPHONE HCL 1 MG/ML IJ SOLN
1 | INTRAMUSCULAR | Status: DC | PRN
Start: 2024-04-17 — End: 2024-04-17
  Administered 2024-04-17: 18:00:00 0.5 mg via INTRAVENOUS

## 2024-04-17 MED ORDER — SODIUM CHLORIDE (PF) 0.9 % IJ SOLN
0.9 | INTRAMUSCULAR | Status: DC | PRN
Start: 2024-04-17 — End: 2024-04-17

## 2024-04-17 MED ORDER — DROPERIDOL 2.5 MG/ML IJ SOLN
2.5 | Freq: Once | INTRAMUSCULAR | Status: DC | PRN
Start: 2024-04-17 — End: 2024-04-17

## 2024-04-17 MED ORDER — LIDOCAINE HCL 1 % IJ SOLN
1 | INTRAMUSCULAR | Status: DC | PRN
Start: 2024-04-17 — End: 2024-04-17
  Administered 2024-04-17: 17:00:00 20 via SUBCUTANEOUS
  Administered 2024-04-17: 17:00:00 10 via SUBCUTANEOUS

## 2024-04-17 MED ORDER — ONDANSETRON HCL 4 MG/2ML IJ SOLN
4 | Freq: Once | INTRAMUSCULAR | Status: DC | PRN
Start: 2024-04-17 — End: 2024-04-17

## 2024-04-17 MED ORDER — NORMAL SALINE FLUSH 0.9 % IV SOLN
0.9 | Freq: Two times a day (BID) | INTRAVENOUS | Status: DC
Start: 2024-04-17 — End: 2024-04-17

## 2024-04-17 MED ORDER — SODIUM CHLORIDE 0.9 % IV SOLN
0.9 | INTRAVENOUS | Status: DC | PRN
Start: 2024-04-17 — End: 2024-04-17

## 2024-04-17 MED ORDER — FENTANYL CITRATE (PF) 100 MCG/2ML IJ SOLN
100 | INTRAMUSCULAR | Status: DC | PRN
Start: 2024-04-17 — End: 2024-04-17

## 2024-04-17 MED ORDER — LACTATED RINGERS IV SOLN
INTRAVENOUS | Status: DC | PRN
Start: 2024-04-17 — End: 2024-04-17
  Administered 2024-04-17: 17:00:00 via INTRAVENOUS

## 2024-04-17 MED ORDER — PHENYLEPHRINE HCL 10 MG/ML SOLN (MIXTURES ONLY)
10 | Freq: Once | Status: DC | PRN
Start: 2024-04-17 — End: 2024-04-17
  Administered 2024-04-17 (×2): 100 via INTRAVENOUS
  Administered 2024-04-17: 17:00:00 200 via INTRAVENOUS
  Administered 2024-04-17 (×3): 150 via INTRAVENOUS
  Administered 2024-04-17: 17:00:00 100 via INTRAVENOUS
  Administered 2024-04-17: 17:00:00 150 via INTRAVENOUS

## 2024-04-17 MED ORDER — HYDRALAZINE HCL 20 MG/ML IJ SOLN
20 | INTRAMUSCULAR | Status: DC | PRN
Start: 2024-04-17 — End: 2024-04-17

## 2024-04-17 MED ORDER — MIDAZOLAM HCL 2 MG/2ML IJ SOLN
2 | INTRAMUSCULAR | Status: AC
Start: 2024-04-17 — End: ?

## 2024-04-17 MED ORDER — LIDOCAINE HCL (PF) 1 % IJ SOLN
1 | Freq: Once | INTRAMUSCULAR | Status: DC | PRN
Start: 2024-04-17 — End: 2024-04-17

## 2024-04-17 MED ORDER — BUPIVACAINE HCL (PF) 0.25 % IJ SOLN
0.25 | INTRAMUSCULAR | Status: AC
Start: 2024-04-17 — End: ?

## 2024-04-17 MED ORDER — VANCOMYCIN HCL 1 G IV SOLR
1 | INTRAVENOUS | Status: AC
Start: 2024-04-17 — End: ?

## 2024-04-17 MED ORDER — PROPOFOL 1000 MG/100ML IV EMUL
1000 | Freq: Once | INTRAVENOUS | Status: DC | PRN
Start: 2024-04-17 — End: 2024-04-17
  Administered 2024-04-17: 17:00:00 20 via INTRAVENOUS
  Administered 2024-04-17: 17:00:00 10 via INTRAVENOUS
  Administered 2024-04-17: 17:00:00 30 via INTRAVENOUS
  Administered 2024-04-17 (×6): 20 via INTRAVENOUS
  Administered 2024-04-17: 17:00:00 10 via INTRAVENOUS

## 2024-04-17 MED ORDER — IOPAMIDOL 61 % IV SOLN
61 | Freq: Once | INTRAVENOUS | Status: AC | PRN
Start: 2024-04-17 — End: 2024-04-17
  Administered 2024-04-17: 13:00:00 80 mL via INTRAVENOUS

## 2024-04-17 MED ORDER — KETAMINE HCL 50 MG/ML IV SOSY
50 | INTRAVENOUS | Status: AC
Start: 2024-04-17 — End: ?

## 2024-04-17 MED ORDER — FENTANYL CITRATE (PF) 100 MCG/2ML IJ SOLN
100 | INTRAMUSCULAR | Status: AC
Start: 2024-04-17 — End: ?

## 2024-04-17 MED FILL — METRONIDAZOLE 500 MG/100ML IV SOLN: 500 MG/100ML | INTRAVENOUS | Qty: 100

## 2024-04-17 MED FILL — METRONIDAZOLE 500 MG/100ML IV SOLN: 500 MG/100ML | INTRAVENOUS | Qty: 100 | Fill #0

## 2024-04-17 MED FILL — VANCOMYCIN 1250 MG IN NS 250 ML (PREMIX) IVPB: Qty: 250 | Fill #0

## 2024-04-17 MED FILL — VANCOMYCIN HCL 1 G IV SOLR: 1 g | INTRAVENOUS | Qty: 1000 | Fill #0

## 2024-04-17 MED FILL — LIDOCAINE HCL 1 % IJ SOLN: 1 % | INTRAMUSCULAR | Qty: 20 | Fill #0

## 2024-04-17 MED FILL — OXYCODONE-ACETAMINOPHEN 7.5-325 MG PO TABS: 7.5-325 mg | ORAL | Qty: 1 | Fill #0

## 2024-04-17 MED FILL — ISOVUE-300 61 % IV SOLN: 61 % | INTRAVENOUS | Qty: 80

## 2024-04-17 MED FILL — LACTATED RINGERS IV SOLN: INTRAVENOUS | Qty: 1000 | Fill #0

## 2024-04-17 MED FILL — HYDROMORPHONE HCL 1 MG/ML IJ SOLN: 1 mg/mL | INTRAMUSCULAR | Qty: 1 | Fill #0

## 2024-04-17 MED FILL — MORPHINE SULFATE 2 MG/ML IJ SOLN: 2 mg/mL | INTRAMUSCULAR | Qty: 1 | Fill #0

## 2024-04-17 MED FILL — BUPIVACAINE HCL (PF) 0.25 % IJ SOLN: 0.25 % | INTRAMUSCULAR | Qty: 30 | Fill #0

## 2024-04-17 MED FILL — KETAMINE HCL 50 MG/ML IV SOSY: 50 mg/mL | INTRAVENOUS | Qty: 1 | Fill #0

## 2024-04-17 MED FILL — CEFEPIME HCL 1 G IJ SOLR: 1 g | INTRAMUSCULAR | Qty: 1000

## 2024-04-17 MED FILL — FENTANYL CITRATE (PF) 100 MCG/2ML IJ SOLN: 100 MCG/2ML | INTRAMUSCULAR | Qty: 2 | Fill #0

## 2024-04-17 MED FILL — MIDAZOLAM HCL 2 MG/2ML IJ SOLN: 2 mg/mL | INTRAMUSCULAR | Qty: 2 | Fill #0

## 2024-04-17 MED FILL — IPRATROPIUM-ALBUTEROL 0.5-2.5 (3) MG/3ML IN SOLN: 0.5-2.5 (3) MG/3ML | RESPIRATORY_TRACT | Qty: 3 | Fill #0

## 2024-04-17 MED FILL — CEFEPIME HCL 1 G IJ SOLR: 1 g | INTRAMUSCULAR | Qty: 1000 | Fill #0

## 2024-04-17 NOTE — Other (Signed)
 04/17/24 1413   Family Communication   Contact Person Relationship to Patient Daughter   Contact Person Phone Number Bobbette Bihari [phone]   Family/Significant Other Update Called   Delivery Origin Nurse   Message Disposition Family present - message delivered   Update Given Yes   Family Communication   Family Update Message Patient stable

## 2024-04-17 NOTE — Progress Notes (Signed)
 INFECTIOUS DISEASE FOLLOW UP NOTE     Date of admission: 04/14/2024     Date of consult: April 15, 2024        ABX:      Current abx Prior abx    Cefepime /Vanco 7/14-3  Flagyl  7/15-2        ASSESSMENT:       Right foot abscess  -Unclear cause of swelling and pain that has been present for several months  -Has been evaluated by orthopedic  -Patient seen by [facility] specialist Dr. Curtistine Emperor with last visit 03/11/2024 for right contusion of dorsum of foot, lower extremity edema and right foot pronation deformity  -03/08/2024 MRI right hindfoot no fracture.  Mild multifocal osseous edema?  Altered gait.  And mechanical stress-related changes.  Mild intrinsic foot muscular atrophic and intramuscular edema.  Subcu edema along the dorsum of the foot  -xray no fracture  -S/p I&D by ED pending cultures   - Patient seen by podiatrist Dr. Roberts   - Awaiting CT foot    Left foot swelling  -Xray no fracture  -Low threshold for   Leucocytosis POA   AKI/CKD  -Abnormal creatinine few weeks ago on routine blood work at [employer]   PAD  -Patient has been following with vascular surgeon and last seen in office on 04/08/2024  -12/2023 ABI at [facility] with severe left lower extremity arterial insufficiency at rest and right first digital disease with known proximal arterial insufficiency  -12/2023 s/p right lower extremity angiogram showed single-vessel peroneal runoff with good branches   Ch hypoxic resp failure   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  -Has been following with neurosurgery at [facility] artery aneurysm  -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 and following with [facility] neurosurgery with plan for continued management   Ax- PCN -as a child   Co-morb- COPD, schizoaffective disorder, HTN,  depression,             RECOMMENDATIONS:      Complicated 73 year old female with significant comorbidities coming with bilateral feet swelling, right worse than left    - Patient afebrile, stable vitals, not requiring submental oxygen  - Last WBC 15.8, Hb 9.2, PLT 328, BUN 23, CR 1.07, CRP 252, ESR 110  -Micro 7/15 urine culture less than 10K skin and genital contamination, wound culture IP, 7/14 blood culture x 2 IP  -Imaging 7/17 CT left foot cellulitis of lateral midfoot and forefoot with more focal 3.8 cm area of fluid at the level of midfoot suspicious for early developing abscess.  No evidence of osteo 7/15 retroperitoneal ultrasound unremarkable except partially exophytic simple appearing interpolar left renal cyst, 7/15 CT right foot subcu phlegmon/abscess lateral right hindfoot and 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 of the subjacent lateral margin of the calcaneus     Assessment     Right foot abscess  - Coming with swelling, pain of right foot   - Understand I&D in ED by provider and cultures negative so far  - CT reviewed and podiatry planning I&D later today with possible wound VAC  - Anticipate deep cultures and path  - Continue with local wound care, limb elevation and broad-spectrum antibiotic for now     Left foot swelling  - Tenderness on dorsal aspect  -CT suggestive of cellulitis with focal area of collection?  Abscess  -Podiatry on board and await further recommendations regarding this    PAD  -  Previous history of angio  - Vascular on board and await further recommendations    Bilateral leg edema  - Recent PVL 7/8 was negative for DVT but low threshold to repeat     Leukocytosis likely reactive and monitor     AKI?  CKD  - Avoid nephrotoxic medications and dose for current creatinine clearance  - Renal ultrasound without any stone or obstructionOther details as above        Plan  -Continue IV Vanco, cefepime  and Flagyl  for now  - Await surgical drainage of right foot and anticipate deep cultures and  path  -Await further input from podiatry regarding left foot abscess  -Local wound care per others  -Await vascular studies  - Keep legs elevated on pillows  - Monitor serial lab including inflammatory markers  - Monitor for any ADE  - Remains at risk for worsening                      MICROBIOLOGY:      7/14 Blcx x2 IP              Wd cx IP  7/15 Ucx IP     LINES AND CATHETERS:      PIV     SUMMARY:      Susan [name] is a 73 year old African-American female with past medical history of COPD, schizoaffective disorder, hypertension, depression, PAD, peripheral neuropathy was admitted on 7/14 with worsening leg pain.     Patient is not a good historian and says lives with her daughter.  She has been almost bedbound for quite some time. Pt has been following with [facility] for basilar artery aneurysm and conservative Mx.  Patient also developed right leg swelling for which she was referred to Ortho and eventually podiatry.  It was not thought to be the part of her back leading to right leg swelling and neuropathy.  Patient also following with vascular and had procedure done in March with good runoff and did not think was concern for pain and swelling.  Patient last seen by podiatry 03/11/2024 and plan for conservative management.      She was seen in ED on 8 July with leg swelling.  Workup showed negative PVL for DVT and chest x-ray without any significant finding.  She was noted with pitting edema and was given some Lasix  and asked to follow-up with PCP for adjustment in medications.  Patient returned 7/14 with worsening swelling of the legs, right worse than left.  Workup showed stable vitals, afebrile state.  WBC elevated at 19, Hb 9.8, PLT 354, BUN 34, CR 1.43, AST 17, ALT 14.  Blood cultures were obtained.  Patient had bilateral feet x-ray which showed no fracture or concern for osteo.  Chest x-ray was suggestive of left basilar opacity with developing pneumonia versus atelectasis.  Patient had I&D in the ED.  Podiatry was  consulted and CT was ordered.  Wound culture in progress.  Patient on broad-spectrum antibiotic with cefepime  and Vanco.  ID was asked for further evaluation management.       SUBJECTIVE :     Interval notes reviewed.  Patient afebrile.  Remains with pain in feet.  Plan for surgery based on CT discussed with her.  No family at bedside.    OBJECTIVE     BP (!) 122/57   Pulse 80   Temp 99.7 F (37.6 C) (Temporal)   Resp 17   Ht 1.702 m (5' 7)  Wt 81.6 kg (180 lb)   SpO2 99%   BMI 28.19 kg/m     Temp (24hrs), Avg:98.6 F (37 C), Min:97.3 F (36.3 C), Max:99.9 F (37.7 C)    General: Fairly developed and nourished 73 y.o. year-old, female, in no acute distress and chronically sick appearing  HEENT: Normocephalic, anicteric sclerae, Pupils equal, round reactive to light, no oropharyngeal lesions. No sinus tenderness.  Neck: Supple, no lymphadenopathy, masses or thyromegaly  Chest: Symmetrical expansion  Lungs: Clear to auscultation bilaterally, no dullness  Heart: Regular rhythm, no murmur, no rub or gallop, No JVD  Abdomen: Soft, non-tender,non distended, no organomegaly, BS+  Musculoskeletal: Bilateral legs with edema.  Right foot currently under dressing which was not removed.  Edema noted on the dorsal aspect of the left foot and tender on deep palpation.  Feet are warm though difficult to palpate pulses  CNS: AAOx3.  Follows simple commands.  SKIN: No obvious   skin lesion or rash. Dry, warm, intact         MEDICATIONS:     Current Facility-Administered Medications   Medication Dose Route Frequency Provider Last Rate Last Admin    iopamidol  (ISOVUE -300) 61 % IntraVENous 80 mL  80 mL IntraVENous ONCE PRN Josepha Planas, DO        senna (SENOKOT) tablet 8.6 mg  1 tablet Oral Daily PRN Matriano, Norleen Deward DEL, MD        docusate sodium  (COLACE) capsule 100 mg  100 mg Oral BID PRN Matriano, Norleen Deward DEL, MD        ondansetron  (ZOFRAN ) injection 4 mg  4 mg IntraVENous Q6H PRN Matriano, Norleen Deward DEL, MD         acetaminophen  (TYLENOL ) tablet 650 mg  650 mg Oral Q6H PRN Matriano, Norleen Deward DEL, MD        melatonin tablet 3 mg  3 mg Oral Nightly PRN Matriano, Norleen Deward DEL, MD        naloxone  (NARCAN ) injection 0.4 mg  0.4 mg IntraVENous PRN Matriano, Norleen Deward DEL, MD        glucagon  injection 1 mg  1 mg IntraMUSCular PRN Matriano, Norleen Deward DEL, MD        dextrose  50 % IV solution  20-30 mL IntraVENous PRN Matriano, Norleen Deward DEL, MD        [Held by provider] heparin  (porcine) injection 5,000 Units  5,000 Units SubCUTAneous BID Murrell Norleen Deward DEL, MD   5,000 Units at 04/16/24 9081    albuterol  (ACCUNEB ) nebulizer solution 2.5 mg  2.5 mg Nebulization Q4H PRN Matriano, Norleen Deward DEL, MD        benzonatate  (TESSALON ) capsule 100 mg  100 mg Oral TID PRN Matriano, Norleen Deward DEL, MD        cefepime  (MAXIPIME ) 1,000 mg in sodium chloride  0.9 % 50 mL IVPB (mini-bag)  1,000 mg IntraVENous Q12H Matriano, Norleen Deward DEL, MD   Stopped at 04/17/24 0200    vancomycin  (VANCOCIN ) 1250 mg in sodium chloride  0.9% 250 mL IVPB  1,250 mg IntraVENous Q24H Matriano, Norleen Deward DEL, MD   Stopped at 04/17/24 0120    Vancomycin  Trough Due  1 each Other Once Matriano, Norleen Deward DEL, MD        *Pharmacy to Dose Vancomycin   1 each Other RX Placeholder Matriano, Norleen Deward DEL, MD        morphine  (PF) injection 1 mg  1 mg IntraVENous Q3H PRN Matriano, Norleen Deward DEL, MD  1 mg at 04/16/24 1150    ipratropium 0.5 mg-albuterol  2.5 mg (DUONEB ) nebulizer solution 1 Dose  1 Dose Inhalation TID RT Josepha Planas, DO   1 Dose at 04/17/24 0747    sodium hypochlorite (DAKINS) 0.125 % external solution   Irrigation Daily Anastasio Adine LABOR, DPM        metroNIDAZOLE  (FLAGYL ) 500 mg in 0.9% NaCl 100 mL IVPB premix  500 mg IntraVENous Q8H Meade Viviane RAMAN, MD   Stopped at 04/17/24 9348    oxyCODONE -acetaminophen  (PERCOCET ) 7.5-325 MG per tablet 1 tablet  1 tablet Oral Q4H PRN Josepha Planas, DO   1 tablet at 04/17/24 0231         Labs: Results:   Chemistry Recent Labs     04/14/24  1956  04/16/24  1121   NA 140 136   K 4.2 4.4   CL 101 101   CO2 29 24   BUN 34* 23      CBC w/Diff Recent Labs     04/14/24  1956 04/16/24  0615   WBC 19.2* 15.8*   RBC 3.37* 3.14*   HGB 9.8* 9.2*   HCT 31.4* 31.0*   PLT 354 328          RADIOLOGY :        Imaging  LE ARTERIAL  Result Date: 04/16/2024                                                                                                                                    Version: 1                                                                                                                                   Study ID: [id number]                                                       [facility]  [address] [place], [place]  [address]                                                           Arterial Lower Extremity Report Name: Susan [name], Susan [name] Date: 04/15/2024, 2: 32 PM MRN: [id number]                                                               Patient Location: [facility] Age: 73 Years                                                               DOB: Feb 22, 1951 (MM/DD/YYYY)                                                                               Gender: Female Performed By: Edilia Romelia Elden Sherrell, RVT                          Account #:: [id number] Ordering Physician: ANASTASIO CAIN A Reason For Study: Peripheral vascular disease.                                                     INTERPRETATION SUMMARY 1. Moderate right lower extremity arterial insufficiency at the level of the iliac-femoral region. 2. Severe left lower extremity arterial insufficiency at the level of the femoral-popliteal region. 3. Digital pressures deferred due to patient's pain. QUALITY/PROCEDURE Arterial Physio ABI [phone]). A bilateral lower extremity arterial exam was  performed. Continuous wave Doppler tracings were recorded from the common femoral, popliteal, posterior tibial and dorsalis pedis arteries bilaterally, and resting ankle brachial pressures were measured. Digital pressures deferred due to patient's pain. RIGHT LEG SEGMENTAL Brachial = 131 mmHg. Ankle Pressure (PT) = 81 mmHg. Ankle Pressure (DP) = 83 mmHg. Right  ABI = 0.63. The lower extremity ankle brachial index is 0.63 on the right. The right lower extremity waveforms are monophasic in the femoral, the popliteal, the posterior tibial and the dorsalis pedis arteries. RIGHT DIGIT PPG Digit 1 flow is absent. LEFT LEG SEGMENTAL Brachial = 126 mmHg. Ankle (PT) = 59 mmHg. Ankle (DP) = 63 mmHg. Left ABI = 0.48. The lower extremity ankle brachial index is 0.48 on the left. The left lower extremity waveforms are multiphasic in the femoral, and monophasic in the popliteal, the posterior tibial and the dorsalis pedis arteries. LEFT DIGIT PPG Digit 1 flow is absent. ______________________________________________________________________________ Electronically signed by: Dr Blenda Fear, M.D                       04/16/2024, 10: 01 PM     US  RETROPERITONEAL COMPLETE  Result Date: 04/15/2024  INDICATION: Acute renal failure. COMPARISON: None. TECHNIQUE: Transabdominal grayscale and duplex sonography of the retroperitoneum performed. FINDINGS: RIGHT KIDNEY:  10.7 cm in length. (Normal range 9-13 cm) Normal renal parenchymal echogenicity. No obstruction, renal calculi or focal lesion. LEFT KIDNEY: 9.8 cm in length. (Normal range 9-13 cm) Normal renal parenchymal echogenicity.  And interpolar simple-appearing partially exophytic left renal cyst measures 2.6 x 2.6 x 2.1 cm. URINARY BLADDER: Bladder has a pre-void volume of 240.0 mL. (<50 mL WNL) Patient did not void for the exam. Neither ureteral jet(s) identified. No urinary bladder wall thickening or debris within the urinary bladder. (Normal wall thickness <3 mm distended, <5 mm  underdistended.) Bladder contains a Foley catheter and is decompressed. PANCREAS:  Visualized pancreas is within normal limits. IVC: Patent. ABDOMINAL AORTA: Normal caliber abdominal aorta. COMMON ILIAC ARTERIES: Suboptimally visualized secondary to interposed bowel gas.     IMPRESSION: 1.  No hydronephrosis. Kidneys unremarkable sonographically with the exception of a partially exophytic simple-appearing interpolar left renal cyst. 2.  Urinary bladder unremarkable sonographically. 3.  Common iliac arteries above the visualized sonographically due to interposed bowel gas Electronically signed by: Alm Conte, MD 04/15/2024 8:34 PM EDT          Workstation ID: [id number]     CT FOOT RIGHT W CONTRAST  Result Date: 04/15/2024  Indication: Right foot infection. Evaluate for abscess. Comparison: None available Technique: Computed tomographic imaging of the right foot was performed following IV contrast administration. Coronal and sagittal reconstructions were created from the initial axial data set and submitted for interpretation. Findings: Complex gas and fluid containing abscess//phlegmon in the subcutaneous lateral hindfoot spanning approximately 4.4 cm craniocaudal by 2 x 1.5 cm transverse. This is positioned posterior to the peroneal tendons which are intact and not overtly involved. There is subtle loss of bony cortex/rarefaction at the lateral margin of the subjacent calcaneus concerning for osteomyelitis.     IMPRESSION: 1. Subcutaneous phlegmon/abscess lateral right hindfoot and 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 of the subjacent lateral margin of the calcaneus. Electronically signed by: Glade Reeve, MD 04/15/2024 2:16 PM EDT          Workstation ID: [id number]     XR FOOT RIGHT (2 VIEWS)  Result Date: 04/15/2024  Exam: 2 views of the right foot. Clinical indications:  PAIN. RULE OUT OSTEOMYELITIS. Right foot pain and swelling. Comparison:   04/15/2024;  Findings:  No fracture.  No dislocation. No radiopaque foreign  bodies. No bone erosions.     Impression:  No fracture. No bone erosions to suggest osteomyelitis. Electronically signed by: Celine Rear, MD 04/15/2024 12:52 AM EDT  Workstation ID: [id number]     XR FOOT LEFT (2 VIEWS)  Result Date: 04/15/2024  Exam: 2 views of the left foot. Clinical indications:  PAIN right foot. Swelling. Possible osteomyelitis. Comparison:  None Findings:  No fracture.  No dislocation. No bone erosions. No radiopaque foreign bodies.     Impression:  No fracture. Electronically signed by: Celine Rear, MD 04/15/2024 12:51 AM EDT          Workstation ID: [id number]     XR CHEST PORTABLE  Result Date: 04/15/2024  Exam: AP portable chest Clinical indication: SEPSIS Comparison: 04/08/2024;  Results:  Left basilar opacity. No pneumothorax. Heart normal.  Mediastinal contours are normal. No free air is seen under the hemidiaphragms.   Osseous structures intact.     IMPRESSION: Left basilar opacity. Developing pneumonia versus atelectasis. Electronically signed by: Celine Rear, MD 04/15/2024 12:41 AM EDT          Workstation ID: [id number]     VL DUP LOWER EXTREMITY VENOUS BILATERAL  Result Date: 04/09/2024                                                                                                                                    Version: 1                                                                                                                                   Study ID: [id number]                                                       [facility]                                                       [address] [employer]  [place], [place]  [address]                                                      Lower Extremity Venous Duplex Report Name: CADEN, FATICA Date: 04/08/2024, 2: 37 PM MRN: [id number]                                                                Patient Location: [facility] Age: 53 Years                                                               DOB: [birth year] (MM/DD/YYYY)                                                                               Gender: Female Performed By: Kylee R. Holloman, RVT                                       Account #:: [id number] Ordering Physician: [facility], ADIT B Reason For Study: Lower extremity pain and swelling                                                     INTERPRETATION SUMMARY 1. No evidence of deep vein thrombosis in the bilateral lower extremities. QUALITY/PROCEDURE Complete bilateral venous duplex performed. Quality of the study is good. Location: ER. RIGHT LEG The deep venous system of the right lower extremity was examined using duplex ultrasound. B-mode imaging demonstrated normal compressibility of the right common femoral, femoral, popliteal, posterior tibial and peroneal veins. Doppler flow signals were spontaneous and phasic at all levels. RIGHT SAPHENOUS VEINS Right great saphenous vein at the saphenofemoral junction demonstrates normal compressibility with spontaneous and phasic venous hemodynamics. LEFT LEG The deep venous system of the left lower extremity was examined using duplex ultrasound. B-mode imaging demonstrated normal compressibility of the left common femoral, femoral, popliteal, posterior tibial and peroneal veins. Doppler flow signals were spontaneous and phasic at all levels. LEFT LEG SAPHENOUS VEINS Left great saphenous vein at the saphenofemoral junction demonstrates normal compressibility  with spontaneous and phasic venous hemodynamics. ______________________________________________________________________________ Electronically signed by: Dr Blenda Fear, M.D                       04/09/2024, 8: 57 AM     XR CHEST (2 VW)  Result Date: 04/08/2024  History: Shortness of breath Comparison: None Technique: PA and lateral radiographs of the chest were obtained.  Two views, 2 exposures total. Findings: The lungs and pleural spaces are clear. Normal cardiomediastinal silhouette. Degenerative changes of the thoracic spine.     IMPRESSION: No acute cardiopulmonary disease. Electronically signed by: Norleen Gleason 04/08/2024 11:20 AM EDT          Workstation ID: [id number]     ECHO CARDIOGRAM COMPLETE-IN HOUSE  Result Date: 03/21/2024  CONCLUSIONS   * Left ventricular systolic function is normal with an ejection fraction of 66 % by Simpson's biplane.   * Left ventricular chamber size is normal.   * Left ventricular diastolic function: indeterminate.   * Right ventricular systolic function is normal.   * Right ventricular chamber dimension is normal.   * There is mild aortic valve stenosis with a peak velocity of 2.8 m/s, mean gradient of 15 mmHg, and aortic valve area of 1.59 cm2.   * There is mild aortic valve regurgitation.   * Aortic valve regurgitation pressure halftime is 570 ms.   * There is trace mitral valve regurgitation.   * There is moderate tricuspid valve regurgitation.   * Moderate pulmonary hypertension, estimated pulmonary arterial systolic pressure is 51 mmHg.   * There is no pericardial effusion.   * The aortic root at the sinus of Valsalva is normal measuring 2.95 cm with an index of 1.5.   * The proximal ascending aorta is normal measuring 2.88 cm with an index of 1.4 cm/m2.   * For the indication of aortic valve calcification, findings are as noted. Comparison   * Compared to prior study from 09/14/17, changes include new mild aortic stenosis.   * Prior estimated pulmonary arterial systolic pressure was 48 mmHg.   * Trace aortic and tricuspid regurgitation increased. Patient Info Name:     Susan [name] Age:     11 years DOB:     [birth year] Gender:     Female MRN:     [id number] Ht:     67 in Wt:     188 lb BSA:     2.03 m2 BP:     138 /     72 mmHg Heart Rhythm:     Sinus Rhythm Exam Date:     03/21/2024 2:57 PM Patient Status:     OP Exam Type:     ECHO  CARDIOGRAM COMPLETE Indications     Aortic valve calcification - Left Ventricle   Left ventricular systolic function is normal with an ejection fraction of 66 % by Simpson's biplane. Left ventricular segmental wall motion is normal. Left ventricular chamber size is normal. Left ventricular diastolic function: indeterminate. Right Ventricle   Tricuspid annular plane systolic excursion (TAPSE) is normal, 2.8 cm. Right ventricular systolic function is normal. Right ventricular chamber dimension is normal. Ventricular Septum   Intact interventricular septum visualized by color Doppler imaging. Left Atrium   Left atrial chamber is normal with a left atrial volume index biplane method of disk (BP MOD) of 24 ml/m^2. Right Atrium   Right atrial chamber size is normal. Atrial Septum   Intact interatrial septum visualized by  color Doppler imaging. Aortic Valve   The aortic valve is tricuspid. There is mild aortic valve stenosis with a peak velocity of 2.8 m/s, mean gradient of 15 mmHg, and aortic valve area of 1.59 cm2. There is mild aortic valve regurgitation. Aortic valve regurgitation pressure halftime is 570 ms. The aortic valve dimensionless index by VTI is 0.56. Left ventricular outflow tract stroke volume index is 42.14 ml/m2. Pulmonic Valve   The pulmonic valve is not well visualized. There is no pulmonic valve stenosis. There is mild pulmonic regurgitation. Mitral Valve   The mitral valve has normal leaflets. There is no mitral valve stenosis. There is trace mitral valve regurgitation. Tricuspid Valve   The tricuspid valve leaflets are normal. There is no tricuspid valve stenosis. There is moderate tricuspid valve regurgitation. Moderate pulmonary hypertension, estimated pulmonary arterial systolic pressure is 51 mmHg. Pericardium/Pleural   There is no pericardial effusion. Inferior Vena Cava   Normal inferior vena cava diameter with >50% collapse upon inspiration consistent with normal right atrial pressure, 3 mmHg.  Aorta   The proximal ascending aorta is normal measuring 2.88 cm with an index of 1.4 cm/m2. The aortic root at the sinus of Valsalva is normal measuring 2.95 cm with an index of 1.5. The aortic measurements are indexed to body surface area. Left Ventricular Outflow Tract ---------------------------------------------------------------------- Name                                 Value        Normal ---------------------------------------------------------------------- LVOT 2D ---------------------------------------------------------------------- LVOT Diameter                       1.9 cm               LVOT Doppler ---------------------------------------------------------------------- LVOT Peak Velocity              124.0 cm/s               LVOT Peak Gradient                6.0 mmHg               LVOT Mean Gradient                3.0 mmHg               LVOT VTI                           30.2 cm                 LVOT Stroke Volume                 85.6 ml               LVOT Stroke Volume Index        42.1 ml/m2               LVOT Area                          2.8 cm2 Mitral Valve ---------------------------------------------------------------------- Name                                 Value        Normal ----------------------------------------------------------------------  MV Diastolic Function ---------------------------------------------------------------------- MV E Peak Velocity               83.1 cm/s               MV A Peak Velocity              117.0 cm/s               MV E/A                                 0.7       0.8-2.0 MV Decel Time PW                    232 ms               MV Annular TDI ---------------------------------------------------------------------- MV Septal e' Velocity             6.5 cm/s         >=7.0 MV E/e' (Septal)                      12.8               MV Lateral e' Velocity            7.9 cm/s        >=10.0 MV E/e' (Lateral)                     10.5               MV e' Average                      7.2 cm/s               MV E/e' (Average)                     11.7        <=14.0 Tricuspid Valve ---------------------------------------------------------------------- Name                                 Value        Normal ---------------------------------------------------------------------- TV Regurgitation Doppler ---------------------------------------------------------------------- TR Peak Velocity                345.0 cm/s       <=280.0 TR Peak Gradient                 48.0 mmHg               Estimated PAP/RSVP ---------------------------------------------------------------------- RA Pressure                         3 mmHg            <8 PA Systolic Pressure               51 mmHg           <35 RV Systolic Pressure               51 mmHg           <36 Aorta ---------------------------------------------------------------------- Name  Value        Normal ---------------------------------------------------------------------- Ascending Aorta ---------------------------------------------------------------------- Sinus of Valsalva Diameter          3.0 cm               Prox Asc Ao Diameter                2.9 cm         <=3.1 Prox Asc Ao Diameter Index       1.4 cm/m2         <=1.9 Venous ---------------------------------------------------------------------- Name                                 Value        Normal ---------------------------------------------------------------------- IVC/SVC ---------------------------------------------------------------------- IVC Diameter (Exp 2D)               1.9 cm         <=2.1 Aortic Valve ---------------------------------------------------------------------- Name                                 Value        Normal ---------------------------------------------------------------------- AV Doppler ---------------------------------------------------------------------- AV Peak Velocity                   2.8 m/s               AV Peak Gradient                  30.0 mmHg               AV Mean Gradient                 15.0 mmHg               AV VTI                             54.0 cm               AV Area (Cont Eq VTI)              1.6 cm2         >=3.0 AV Area Index (Cont Eq VTI)     0.8 cm2/m2               AV Area (Cont Eq Vel)              1.3 cm2               AV Area Index (Cont Eq Vel)     0.6 cm2/m2                AV Dimensionless Index (Peak Velocity)                        0.4                  AV Dimensionless Index (VTI)                                  0.6               AV Regurgitation 2D ---------------------------------------------------------------------- LVOT Area  2.8 cm2               AV Regurgitation Doppler ---------------------------------------------------------------------- AR Decel Slope                  192.0 cm/s2               AR PHT                              570 ms Ventricles ---------------------------------------------------------------------- Name                                 Value        Normal ---------------------------------------------------------------------- LV Dimensions 2D/MM ----------------------------------------------------------------------  IVS Diastolic Thickness (2D)                                1.3 cm       0.5-0.9 LVID Diastole (2D)                  4.5 cm       3.8-5.2  LVPW Diastolic Thickness (2D)                                1.3 cm       0.5-0.9 LVID Systole (2D)                   3.1 cm       2.2-3.5 LVID Diastolic Index (2D)        2.2 cm/m2       2.3-3.1 LVOT Diameter                       1.9 cm               LV Fractional Shortening/Ejection Fraction 2D/MM ---------------------------------------------------------------------- LV EF (2D Teichholz)                  58 %               LV EF (BP MOD)                        66 %         54-74  LV Fractional Shortening (2D)                                32.9 %     27.0-45.0  LV Diastolic Volume Index (BP MOD)                         24.7 ml/m2     29.0-61.0 LV SV (2C MOD)                     28.0 ml               LV SV (BP MOD)                     33.3 ml               LV SV (Cubed)  60.3 ml               LV SV (Teich)                      53.5 ml               LV EDV (Cubed)                     90.9 ml               LV ESV (Cubed)                     30.6 ml               LV EDV (Teich)                     92.2 ml               LV ESV (Teich)                     38.8 ml               RV Dimensions 2D/MM ----------------------------------------------------------------------  RV Basal Diastolic Dimension                           3.2 cm       2.5-4.1 TAPSE                               2.8 cm         >=1.7 TAPSV                            10.2 cm/s               RV/LV Diam Ratio (Diastole)            0.9 Atria ---------------------------------------------------------------------- Name                                 Value        Normal ---------------------------------------------------------------------- LA Dimensions ---------------------------------------------------------------------- LA Dimension (2D)                   3.0 cm       2.7-3.8 LA Dimen Index (2D)              1.5 cm/m2               LA Volume (BP A-L)                 49.6 ml               LA Volume Index (BP A-L)        24.4 ml/m2         <35.0 LA Volume Index (BP MOD)        23.5 ml/m2        <=34.0 RA Dimensions ----------------------------------------------------------------------  RA Diastolic Major Axis Length (4C)                         4.4 cm  RA Diastolic Major Axis Length Index (4C)                      0.2               RA Area (4C)                      14.4 cm2         <21.0 RA ESV Index (4C MOD)           19.3 ml/m2     15.0-27.0 Wall Motion Scoring Wall Motion Scoring Index:     1.00 Technical Quality:     Fair Complete transthoracic echocardiogram performed with 2D imaging, color Doppler, and spectral Doppler. Staff  Referring Provider:     Garnette To Ordering Provider:     Glendia DELENA Marina MD Sonographer:     Luke ONEIDA Finder RDCS [phone] Report Signatures Finalized by Glendia DELENA Marina  MD on 03/21/2024 03:44 PM       I have independently reviewed lab studies and imgaing as well as review of nursing notes and physican notes from the past 24 hours.    Dragon medical dictation software was used for portions of this report. Unintended errors may occur.     VIVIANE GORMAN GORE, MD  April 17, 2024    [facility] Infectious Disease   Office Phone:[phone]  [phone]

## 2024-04-17 NOTE — Op Note (Signed)
 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    Anesthesia: Monitor Anesthesia Care    Estimated Blood Loss (mL): less than 50     Complications: None    Specimens:   ID Type Source Tests Collected by Time Destination   1 : Right foot wound Swab Foot CULTURE, ANAEROBIC, CULTURE, WOUND W VONNE MOZELLA Rosamond Inches, DPM Apr 21, 2024 1252    2 : Right Heel Bone Bone Foot CULTURE, ANAEROBIC, CULTURE, SURGICAL WOUND W VONNE MOZELLA Rosamond Inches, DPM 2024/04/21 1305    A : Right heel Tissue Foot SURGICAL PATHOLOGY Rosamond Inches, DPM 2024/04/21 1305        Implants:  * No implants in log *      Drains:   Negative Pressure Wound Therapy Right (Active)       External Urinary Catheter (Active)   Site Assessment Clean,dry & intact 04-21-24 1055   Placement Replaced 21-Apr-2024 0220   Securement Method Other (Comment) 04/15/24 0110   Catheter Care Catheter/Wick replaced 04/16/24 1724   Perineal Care Yes 04/16/24 1724   Suction 40 mmgHg continuous 04/16/24 1724   Urine Color Amber 04/16/24 1724   Urine Appearance Hazy 04/16/24 1724   Output (mL) 350 mL 04/18/24 0457       Findings:  Infection Present At Time Of Surgery (PATOS) (choose all levels that have infection present):  - Deep Infection (muscle/fascia) present as evidenced by abscess and fluid consistent with infection      Detailed Description of Procedure:   On  April 21, 2024 the patient was identified in the pre-operative holding area. With the assistance of the patient my initials were placed on the patients right lower extremity. The patient was then taken to the OR and transferred to the OR table and placed in the supine position. Anesthesia was administered and the patients right lower extremity was then prepped and draped in a standard fashion.  A timeout was performed to verify all pertinent safety measures and verified with the patients bracelet and chart.    Attention was then directed to the lateral aspect of the right hindfoot where a ulceration measuring approximately 3x3x2cm and probed bone as well as tunneled in distal, superior and proximal. A #15 blade was utilized to sharply excise the ulceration which extended through multiple soft tissue layers down to the level of bone. At this time tenotomy scissors were utilized to bluntly dissect the wound through out the soft tissue planes and it was noted to be tracking distally and superiorly and large abscesses were identified. The incision was extended over the abscesses to decompress the area. Following decrompression of the wound and underlying abscesses a nonviable wound bed remained. Deep cultures were obtained and a portion of the calcaneus was excised and sent off  to pathology and microbiology. All of the nonviable tissue within the wound bed was debrided with a rongeur, curette and #15 blade. The wound was then copiously irrigated with normal saline and irrisept. Following irrigation there was not further infectious process noted. Wound now measures 10x8x2cm. The wound was then dressed with a wound vac at and a jones compression wrap.     The patient was transferred back to pacu in stable condition, she tolerated the procedure well.     Electronically signed by ARTHOR FEAR, DPM on 04/18/2024 at 5:34 PM

## 2024-04-17 NOTE — Progress Notes (Signed)
 [facility]   Progress Note                                                              Admit Date: 04/14/2024  Assessment & Plan:   Susan [name] is a 73 y.o. 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 I have reviewed all pertinent labs and imaging. I independently created a management plan with discussion with Waddell Prince, PA-C and I agree with the note.     ABI reviewed. Notable monophasic waveforms in the femoral. npo past midnight for angiogram possibly tomorrow vs Monday.       Lashina Milles K. Carola Viramontes, MD  Vascular and Endovascular Surgery  [facility]  Office: [phone]      Subjective:   Patient with R foot pain.    Objective:   BP 129/60   Pulse 81   Temp 98.4 F (36.9 C) (Temporal)   Resp 18   Ht 1.702 m (5' 7)   Wt 81.6 kg (179 lb 14.3 oz)   SpO2 100%   BMI 28.18 kg/m   Gen: AAOx3, afebrile, NAD  Cardiac: RR  Pulm: normal resp effort  Abd: soft, NT/ND  Skin: warm, dry  Wound: R foot bulky dressings c/d/I, no surrounding erythema, edema, mass, ecchymosis        Labs   CBC w/Diff   Lab Results   Component Value Date/Time    WBC 15.8 (H) 04/16/2024 06:15 AM    RBC 3.14 (L) 04/16/2024 06:15 AM    HCT 31.0 (L) 04/16/2024 06:15 AM    MCV 98.7 (H) 04/16/2024 06:15 AM    MCH 29.3 04/16/2024 06:15 AM    MCHC 29.7 (L) 04/16/2024 06:15 AM    RDW 55.9 (H) 04/16/2024 06:15 AM    MPV 10.3 (H) 04/16/2024 06:15 AM        Basic Metabolic Profile   Recent Labs     04/14/24  1956 04/16/24  1121   NA 140 136   K 4.2 4.4   CL 101 101   CO2 29 24   BUN 34* 23   CREATININE 1.43* 1.07*   GLUCOSE 99 98   CALCIUM  9.9 9.4   BILITOT 0.40 0.40   ALKPHOS 75 82   AST 17.0 14.0   ALT 14 11        Coagulation   No results  found for: INR, APTT     PVL/Radiology reviewed  Peripheral Vascular Lab Preliminary : Ankle Brachial Index      1. Moderate arterial insufficiency in the right lower extremity arterial examination at rest.   2. Severe arterial insufficiency in the left lower extremity arterial examination at rest.   3. Digital pressures deferred due to patients's pain.      Right ABI = 0.63  Left ABI = 0.48       Waddell Prince, PA-C  [facility]  [phone] pager (M-F 7a-4p)  [phone] office, after hours, weekends  04/17/2024, 10:56 AM

## 2024-04-17 NOTE — Anesthesia Post-Procedure Evaluation (Signed)
 Department of Anesthesiology  Postprocedure Note    Patient: Susan [name]  MRN: [id number]  Birthdate: 1951/09/21  Date of evaluation: 04/17/2024    Procedure Summary       Date: 04/17/24 Room / Location: [facility] MAIN 01 / [facility] MAIN OR    Anesthesia Start: 1226 Anesthesia Stop: 1336    Procedure: RIGHT HEEL IRRIGATION AND DEBRIDEMENT WITH WOUND VAC APPLICATION (Right: Foot) Diagnosis:       Ulcer of right heel, with necrosis of bone (HCC)      (Ulcer of right heel, with necrosis of bone (HCC) [O02.585])    Surgeons: Rosamond Inches, DPM Responsible Provider: Kasey Agent, MD    Anesthesia Type: General, TIVA, MAC ASA Status: 3            Anesthesia Type: General, TIVA, MAC    Aldrete Phase I:      Aldrete Phase II: Aldrete Score: 9    Anesthesia Post Evaluation    Patient location during evaluation: PACU  Patient participation: complete - patient participated  Level of consciousness: awake  Airway patency: patent  Nausea & Vomiting: no vomiting  Cardiovascular status: hemodynamically stable  Respiratory status: acceptable  Hydration status: stable  Multimodal analgesia pain management approach  Pain management: satisfactory to patient    No notable events documented.

## 2024-04-17 NOTE — Progress Notes (Signed)
 Vancomycin  Dosing Per Pharmacy Protocol    Vancomycin  level:19.6    Vancomycin  changed to: Vancomycin  1000 mg IV q24h, dose should still be therapeutic     Predicted Pharmacokinetic Parameters  1000 mg IV Q24hr (infused over 1 hr)  AUC/MIC 471 mcg*hr/mL  (goal 400 to 600 mcg*hr/mL)  Peak 25.8 mcg/mL  Trough 14.5 mcg/mL    Next trough level: 04-19-24 @ 2200    Pharmacy will continue to follow.    Thanks,    Suzen Hails, PharmD

## 2024-04-17 NOTE — Anesthesia Pre-Procedure Evaluation (Addendum)
 Department of Anesthesiology  Preprocedure Note       Name:  Susan [name]   Age:  73 y.o.  DOB:  02-25-1951                                          MRN:  [id number]         Date:  04/17/2024      Surgeon: Clotilde):  Rosamond Inches, DPM    Procedure: Procedure(s):  INCISION & DRAINAGE RIGHT HEEL  WOUND APPLICATION    Medications prior to admission:   Prior to Admission medications    Medication Sig Start Date End Date Taking? Authorizing Provider   furosemide  (LASIX ) 40 MG tablet Take 1 tablet by mouth daily 01/22/24  Yes [provider]   carvedilol (COREG) 12.5 MG tablet Take 1 tablet by mouth 2 times daily (with meals) 01/22/24  Yes [provider]   atorvastatin  (LIPITOR ) 80 MG tablet Take 1 tablet by mouth nightly 01/30/24  Yes [provider]   GEMTESA 75 MG TABS tablet Take 1 tablet by mouth daily 01/23/24  Yes [provider]   VRAYLAR 1.5 MG capsule Take 1 capsule by mouth daily   Yes [provider]   clopidogrel  (PLAVIX ) 75 MG tablet Take 1 tablet by mouth daily   Yes [provider]   QUEtiapine (SEROQUEL) 100 MG tablet Take 1 tablet by mouth daily   Yes [provider]   pantoprazole (PROTONIX) 40 MG tablet Take 1 tablet by mouth daily   Yes [provider]   potassium chloride (KLOR-CON) 10 MEQ extended release tablet Take 1 tablet by mouth daily with food   Yes [provider]   valsartan (DIOVAN) 160 MG tablet Take 1 tablet by mouth 2 times daily   Yes [provider]   MYRBETRIQ 25 MG TB24 Take 1 tablet by mouth daily   Yes [provider]   DOMINIC ELLIPTA 200-62.5-25 MCG/ACT AEPB inhaler Inhale 1 puff into the lungs daily   Yes [provider]       Current medications:    Current Facility-Administered Medications   Medication Dose Route Frequency Provider Last Rate Last Admin   . lidocaine  PF 1 % injection 1 mL  1 mL IntraDERmal Once PRN Kasey Agent, MD       . lactated ringers  infusion    IntraVENous Continuous Kasey Agent, MD       . senna (SENOKOT) tablet 8.6 mg  1 tablet Oral Daily PRN Matriano, John Paul H, MD       . docusate sodium  (COLACE) capsule 100 mg  100 mg Oral BID PRN Matriano, John Paul H, MD       . ondansetron  (ZOFRAN ) injection 4 mg  4 mg IntraVENous Q6H PRN Matriano, Norleen Deward DEL, MD       . acetaminophen  (TYLENOL ) tablet 650 mg  650 mg Oral Q6H PRN Matriano, Norleen Deward DEL, MD       . melatonin tablet 3 mg  3 mg Oral Nightly PRN Matriano, Norleen Deward DEL, MD       . naloxone  (NARCAN ) injection 0.4 mg  0.4 mg IntraVENous PRN Matriano, Norleen Deward DEL, MD       . glucagon  injection 1 mg  1 mg IntraMUSCular PRN Matriano, Norleen Deward DEL, MD       . dextrose  50 %  IV solution  20-30 mL IntraVENous PRN Matriano, Norleen Deward DEL, MD       . Roni by provider] heparin  (porcine) injection 5,000 Units  5,000 Units SubCUTAneous BID Matriano, John Paul H, MD   5,000 Units at 04/16/24 [phone]   . albuterol  (ACCUNEB ) nebulizer solution 2.5 mg  2.5 mg Nebulization Q4H PRN Matriano, Norleen Deward DEL, MD       . benzonatate  (TESSALON ) capsule 100 mg  100 mg Oral TID PRN Matriano, Norleen Deward DEL, MD       . cefepime  (MAXIPIME ) 1,000 mg in sodium chloride  0.9 % 50 mL IVPB (mini-bag)  1,000 mg IntraVENous Q12H Matriano, Norleen Deward DEL, MD 12.5 mL/hr at 04/17/24 1002 1,000 mg at 04/17/24 1002   . vancomycin  (VANCOCIN ) 1250 mg in sodium chloride  0.9% 250 mL IVPB  1,250 mg IntraVENous Q24H Matriano, Norleen Deward DEL, MD   Stopped at 04/17/24 0120   . Vancomycin  Trough Due  1 each Other Once Matriano, Norleen Deward DEL, MD       . *Pharmacy to Dose Vancomycin   1 each Other RX Placeholder Matriano, Norleen Deward DEL, MD       . morphine  (PF) injection 1 mg  1 mg IntraVENous Q3H PRN Matriano, John Paul H, MD   1 mg at 04/16/24 1150   . ipratropium 0.5 mg-albuterol  2.5 mg (DUONEB ) nebulizer solution 1 Dose  1 Dose Inhalation TID RT Josepha Planas, DO   1 Dose at 04/17/24 0747   . sodium hypochlorite (DAKINS) 0.125 % external solution   Irrigation Daily  Anastasio Cain A, DPM       . metroNIDAZOLE  (FLAGYL ) 500 mg in 0.9% NaCl 100 mL IVPB premix  500 mg IntraVENous Q8H Desai, Aarti S, MD   Stopped at 04/17/24 [phone]   . oxyCODONE -acetaminophen  (PERCOCET ) 7.5-325 MG per tablet 1 tablet  1 tablet Oral Q4H PRN Josepha Planas, DO   1 tablet at 04/17/24 0231       Allergies:    Allergies   Allergen Reactions   . Penicillins Other (See Comments)     States unknown reaction     Mother told her as a child she was allergic       Problem List:    Patient Active Problem List   Diagnosis Code   . Asthma J45.909   . Psychiatric disorder F99   . Peripheral neuropathy G62.9   . Chronic obstructive pulmonary disease (HCC) J44.9   . Hypertension I10   . OSA (obstructive sleep apnea) G47.33   . Dyslipidemia E78.5   . Bipolar 1 disorder (HCC) F31.9   . Foot abscess L02.619       Past Medical History:  No past medical history on file.    Past Surgical History:  No past surgical history on file.    Social History:    Social History     Tobacco Use   . Smoking status: Not on file   . Smokeless tobacco: Not on file   Substance Use Topics   . Alcohol use: Not on file                                Counseling given: Not Answered      Vital Signs (Current):   Vitals:    04/17/24 0440 04/17/24 0717 04/17/24 0749 04/17/24 1037   BP: 121/82 (!) 122/57  129/60   Pulse: 76 87 80 81  Resp: 17 20 17 18    Temp: 97.3 F (36.3 C) 99.7 F (37.6 C)  98.4 F (36.9 C)   TempSrc: Temporal Temporal  Temporal   SpO2: 100% 100% 99% 100%   Weight:    81.6 kg (179 lb 14.3 oz)   Height:    1.702 m (5' 7)                                              BP Readings from Last 3 Encounters:   04/17/24 129/60   04/08/24 (!) 100/50   12/19/23 (!) 156/85       NPO Status: Time of last liquid consumption: 2100                        Time of last solid consumption: 2100                        Date of last liquid consumption: 04/16/24                        Date of last solid food consumption: 04/16/24    BMI:   Wt Readings  from Last 3 Encounters:   04/17/24 81.6 kg (179 lb 14.3 oz)   04/08/24 81.6 kg (180 lb)   12/19/23 88.5 kg (195 lb)     Body mass index is 28.18 kg/m.    CBC:   Lab Results   Component Value Date/Time    WBC 15.8 04/16/2024 06:15 AM    RBC 3.14 04/16/2024 06:15 AM    HGB 9.2 04/16/2024 06:15 AM    HCT 31.0 04/16/2024 06:15 AM    MCV 98.7 04/16/2024 06:15 AM    RDW 55.9 04/16/2024 06:15 AM    PLT 328 04/16/2024 06:15 AM       CMP:   Lab Results   Component Value Date/Time    NA 136 04/16/2024 11:21 AM    K 4.4 04/16/2024 11:21 AM    CL 101 04/16/2024 11:21 AM    CO2 24 04/16/2024 11:21 AM    BUN 23 04/16/2024 11:21 AM    CREATININE 1.07 04/16/2024 11:21 AM    GFRAA >60.0 04/16/2024 11:21 AM    LABGLOM 54 04/16/2024 11:21 AM    GLUCOSE 98 04/16/2024 11:21 AM    CALCIUM  9.4 04/16/2024 11:21 AM    BILITOT 0.40 04/16/2024 11:21 AM    ALKPHOS 82 04/16/2024 11:21 AM    AST 14.0 04/16/2024 11:21 AM    ALT 11 04/16/2024 11:21 AM       POC Tests: No results for input(s): POCGLU, POCNA, POCK, POCCL, POCBUN, POCHEMO, POCHCT in the last 72 hours.    Coags: No results found for: PROTIME, INR, APTT    HCG (If Applicable): No results found for: PREGTESTUR, PREGSERUM, HCG, HCGQUANT     ABGs: No results found for: PHART, PO2ART, PCO2ART, HCO3ART, BEART, O2SATART     Type & Screen (If Applicable):  No results found for: ABORH, LABANTI    Drug/Infectious Status (If Applicable):  Lab Results   Component Value Date/Time    HEPCAB <0.1 10/27/2015 12:00 AM       COVID-19 Screening (If Applicable): No results found for: COVID19        Anesthesia Evaluation  Patient summary reviewed and Nursing  notes reviewed  Airway: Mallampati: II  TM distance: >3 FB   Neck ROM: full  Mouth opening: > = 3 FB   Dental: normal exam   (+) upper dentures  Comment: 4 lower teeth remaining    Pulmonary:Negative Pulmonary ROS and normal exam    (+)  COPD:    sleep apnea: on noncompliant,       asthma:                             Cardiovascular:Negative CV ROS  Exercise tolerance: good (>4 METS)  (+) hypertension:         Beta Blocker:  Dose within 24 Hrs      ROS comment: Plavix  7/14       Neuro/Psych:   Negative Neuro/Psych ROS  (+) neuromuscular disease:, psychiatric history:            GI/Hepatic/Renal: Neg GI/Hepatic/Renal ROS            Endo/Other: Negative Endo/Other ROS   (+) blood dyscrasia: anemia:..                 Abdominal:             Vascular: negative vascular ROS.         Other Findings:       Anesthesia Plan      general     ASA 3       Induction: intravenous.    MIPS: Postoperative opioids intended and Prophylactic antiemetics administered.  Anesthetic plan and risks discussed with patient.      Plan discussed with CRNA.    Attending anesthesiologist reviewed and agrees with Preprocedure content            LYNWOOD LATE, MD   04/17/2024

## 2024-04-17 NOTE — Progress Notes (Signed)
 Brief Postoperative Note    MARDA BREIDENBACH  Date of Birth:  [birth year]  [id number]    Pre-operative Diagnosis: right foot wound     Post-operative Diagnosis: Same    Procedure: incision and drainage with debridement and wound vac application     Anesthesia: MAC and Local    Surgeons/Assistants: Dr Arthor Fear    Estimated Blood Loss: 100     Complications: None    Specimens: bone culture bone biopsy of right heel with wound cultures    Findings: infection of right heel lateral aspect     Electronically signed by ARTHOR FEAR, DPM on 04/17/2024 at 3:57 PM

## 2024-04-17 NOTE — Other (Signed)
 TRANSFER - OUT REPORT:    Verbal report given to Yancy RN 5W on Susan [name]  being transferred to [id number] for routine post-op       Report consisted of patient's Situation, Background, Assessment and   Recommendations(SBAR).     Information from the following report(s) Nurse Handoff Report, Adult Overview, Surgery Report, Cardiac Rhythm sr, and Neuro Assessment was reviewed with the receiving nurse.           Lines:   Peripheral IV 04/17/24 Left;Posterior Hand (Active)   Site Assessment Clean, dry & intact 04/17/24 1337   Line Status Intermittent infusions 04/17/24 1337   Line Care Connections checked and tightened 04/17/24 1337   Phlebitis Assessment No symptoms 04/17/24 1337   Infiltration Assessment 0 04/17/24 1337   Alcohol Cap Used Yes 04/17/24 1337   Dressing Status Clean, dry & intact 04/17/24 1337   Dressing Type Transparent 04/17/24 1337        Opportunity for questions and clarification was provided.      Patient transported with:  O2 @ 2lpm and Tech

## 2024-04-17 NOTE — Progress Notes (Addendum)
 Assessment/Plan:     Patient seen for complicated medical problems as listed below :    Right foot abscess   S/p I and D in ER  Left foot swelling  PAD bilateral lower extremities  Chronic hypoxic resp failure  COPD  Possible pneumonia  Plan     Vascular surgery following/ABI done right 0.63/left 0.48  Plan for OR today for debridement as well as wound VAC placement right heel wound  CT scan of left foot reveals a possible 3.8 cm developing abscess  Infectious disease following  Empiric IV vancomycin /cefepime /Flagyl   Awaiting deep cultures/path  Continue bronchodilators  O2 supplementation  Dvt ppx with heparin  subcu, hold for I&D tomorrow   Continue home regimen for otherwise chronic, stable medical conditions as noted above.     Repeat CBC/electrolytes in a.m.    Reason for continued hospitalization: Ongoing workup further interventions needed on lower extremity abscesses    EXAM:  GENERAL: No acute distress  RESPIRATORY: Bilateral BS present. No rales or rhonchi.  No wheezing  CARDIOVASCULAR: S1 and S2 present, regular. No murmur  ABDOMEN: Soft and nontender with positive bowel sounds.   NEUROLOGICAL: Cranial nerves II through XII grossly intact. No focal weakness.  Musculoskeletal: Right heel clean bandages/no draining abscess left foot    Subjective:     Patient seen and examined awaiting OR today for right heel debridement no further complaint    12 point review of systems otherwise negative    Vitals:    04/17/24 0440 04/17/24 0717 04/17/24 0749 04/17/24 1037   BP: 121/82 (!) 122/57  129/60   Pulse: 76 87 80 81   Resp: 17 20 17 18    Temp: 97.3 F (36.3 C) 99.7 F (37.6 C)  98.4 F (36.9 C)   TempSrc: Temporal Temporal  Temporal   SpO2: 100% 100% 99% 100%   Weight:    81.6 kg (179 lb 14.3 oz)   Height:    1.702 m (5' 7)       Recent Labs     04/16/24  0615   WBC 15.8*   HGB 9.2*   HCT 31.0*   MCV 98.7*   PLT 328     Recent Labs     04/16/24  1121   NA 136   K 4.4   CL 101   CO2 24   ANIONGAP 11   CALCIUM   9.4   BUN 23   CREATININE 1.07*   GLUCOSE 98     Recent Labs     04/16/24  1121   AST 14.0   ALKPHOS 82   BILITOT 0.40   ALT 11     Lab Results   Component Value Date/Time    COLORU Yellow 04/15/2024 03:02 AM    CLARITYU Clear 04/15/2024 03:02 AM    GLUCOSEU Negative 04/15/2024 03:02 AM    BILIRUBINUR Negative 04/15/2024 03:02 AM    KETUA Negative 04/15/2024 03:02 AM    BLOODU Trace-lysed 04/15/2024 03:02 AM    PHUR 5.5 04/15/2024 03:02 AM    NITRU Negative 04/15/2024 03:02 AM    LEUKOCYTESUR Trace 04/15/2024 03:02 AM     No results for input(s): POCGLU in the last 72 hours.          Missey Hasley Md  [facility]

## 2024-04-18 ENCOUNTER — Inpatient Hospital Stay: Payer: MEDICARE | Primary: Family Medicine

## 2024-04-18 LAB — BASIC METABOLIC PANEL
Anion Gap: 9 mmol/L (ref 5–15)
BUN: 23 mg/dL (ref 9–23)
CO2: 26 meq/L (ref 20–31)
Calcium: 9.8 mg/dL (ref 8.7–10.4)
Chloride: 104 meq/L (ref 98–107)
Creatinine: 1.05 mg/dL — ABNORMAL HIGH (ref 0.55–1.02)
GFR African American: >60
GFR Non-African American: 55
Glucose: 85 mg/dL (ref 74–106)
Potassium: 5.1 meq/L (ref 3.5–5.1)
Sodium: 139 meq/L (ref 136–145)

## 2024-04-18 LAB — CULTURE, WOUND W GRAM STAIN
Culture Result: NO GROWTH
Gram Stain Result: <10

## 2024-04-18 LAB — CBC WITH AUTO DIFFERENTIAL
Basophils: 0.3 % (ref 0–3)
Eosinophils: 1.2 % (ref 0–5)
Hematocrit: 30.1 % — ABNORMAL LOW (ref 35.0–47.0)
Hemoglobin: 8.8 g/dL — ABNORMAL LOW (ref 11.0–16.0)
Immature Granulocytes %: 0.4 % (ref 0.0–3.0)
Lymphocytes: 6.2 % — ABNORMAL LOW (ref 28–48)
MCH: 29.1 pg (ref 25.4–34.6)
MCHC: 29.2 g/dL — ABNORMAL LOW (ref 30.0–36.0)
MCV: 99.7 fL — ABNORMAL HIGH (ref 80.0–98.0)
MPV: 10.8 fL — ABNORMAL HIGH (ref 6.0–10.0)
Monocytes: 8.2 % (ref 1–13)
Neutrophils Segmented: 83.7 % — ABNORMAL HIGH (ref 34–64)
Nucleated RBCs: 0 (ref 0–0)
Platelets: 365 1000/mm3 (ref 140–450)
RBC: 3.02 M/uL — ABNORMAL LOW (ref 3.60–5.20)
RDW: 56.5 — ABNORMAL HIGH (ref 36.4–46.3)
WBC: 13.5 1000/mm3 — ABNORMAL HIGH (ref 4.0–11.0)

## 2024-04-18 LAB — CREATININE
Creatinine: 1.11 mg/dL — ABNORMAL HIGH (ref 0.55–1.02)
GFR African American: >60
GFR Non-African American: 52

## 2024-04-18 LAB — VANCOMYCIN LEVEL, TROUGH: Vancomycin Tr: 19.6 ug/mL — ABNORMAL HIGH (ref 5.0–10.0)

## 2024-04-18 LAB — MAGNESIUM: Magnesium: 1.6 mg/dL (ref 1.6–2.6)

## 2024-04-18 LAB — ABO/RH RETYPE: ABO/RH Retype: A POS

## 2024-04-18 MED ORDER — VANCOMYCIN INTERMITTENT DOSING (PLACEHOLDER)
Freq: Once | INTRAVENOUS | Status: AC
Start: 2024-04-18 — End: 2024-04-19
  Administered 2024-04-20: 02:00:00 1

## 2024-04-18 MED ORDER — VANCOMYCIN (VANCOCIN) 1000 MG IN SODIUM CHLORIDE 0.9% 250 ML IVPB
Status: DC
Start: 2024-04-18 — End: 2024-04-26
  Administered 2024-04-18 – 2024-04-27 (×10): 1000 mg via INTRAVENOUS

## 2024-04-18 MED FILL — METRONIDAZOLE 500 MG/100ML IV SOLN: 500 MG/100ML | INTRAVENOUS | Qty: 100 | Fill #0

## 2024-04-18 MED FILL — OXYCODONE-ACETAMINOPHEN 7.5-325 MG PO TABS: 7.5-325 mg | ORAL | Qty: 1 | Fill #0

## 2024-04-18 MED FILL — VANCOMYCIN (VANCOCIN) 1000 MG IN SODIUM CHLORIDE 0.9% 250 ML IVPB: Qty: 250 | Fill #0

## 2024-04-18 MED FILL — ACETAMINOPHEN 325 MG PO TABS: 325 mg | ORAL | Qty: 2 | Fill #0

## 2024-04-18 MED FILL — IPRATROPIUM-ALBUTEROL 0.5-2.5 (3) MG/3ML IN SOLN: 0.5-2.5 (3) MG/3ML | RESPIRATORY_TRACT | Qty: 3 | Fill #0

## 2024-04-18 MED FILL — CEFEPIME HCL 1 G IJ SOLR: 1 g | INTRAMUSCULAR | Qty: 1000 | Fill #0

## 2024-04-18 NOTE — Progress Notes (Signed)
 Brief Nutrition Note:     Pt requesting Ensure daily. On CCHO diet, however BG well controlled. Cr slightly elevated, so Enlive more appropriate than Max at this time. Have ordered Ensure Enlive once daily (350 kcals, 20 g protein each).     Current Diet: ADULT DIET; Regular; 4 carb choices (60 gm/meal)  ADULT ORAL NUTRITION SUPPLEMENT; Dinner; Standard High Calorie/High Protein Oral Supplement     Wt Readings from Last 10 Encounters:   04/17/24 81.6 kg (179 lb 14.3 oz)   04/08/24 81.6 kg (180 lb)   12/19/23 88.5 kg (195 lb)   06/19/23 90.7 kg (200 lb)       Will continue to monitor and re-evaluate per protocols. Thank you.    Elenor Jenelle Penner, MS, RDN, CNSC  04/18/24   Office: Ext [phone]

## 2024-04-18 NOTE — Progress Notes (Addendum)
 [facility]  Susan MARLA Fear, MD    [phone]                                                      Admit Date: 04/14/2024  Chief Compliant: R. Heel wound  Assessment & Plan:   Susan [name] is a 73 y.o. female w/ hx of b/l severe LE pain s/p prior RLE angiogram in March who presents for consultation and evaluation of R. Lateral heel abscess s/p podiatric I&D.     Given monophasic waveforms in proximal femoral, will obtain CTA to confirm no inflow disease that has progressed.   Depending on findings, plan for angiogram early next week  Continue ABX per ID    Discussed with Daughter.     Susan LOIS Fear, MD  Vascular and Endovascular Surgery  [facility]  Office: [phone]        Subjective:   Leg pain    Objective:   BP (!) 120/52   Pulse 84   Temp 97.9 F (36.6 C) (Temporal)   Resp 18   Ht 1.702 m (5' 7)   Wt 81.6 kg (179 lb 14.3 oz)   SpO2 100%   BMI 28.18 kg/m   Gen: NAD  Cardiac: Regular rate  Pulm: normal resp effort  Abd: soft, NT/ND  Extremities: dressing cdi  Pulses: non palp pedal    I have reviewed pertinent imaging and labs and PVL studies.     Labs   Recent Labs     04/16/24  0615 04/18/24  0558   WBC 15.8* 13.5*   HGB 9.2* 8.8*   HCT 31.0* 30.1*   PLT 328 365     Recent Labs     04/16/24  0615 04/16/24  1121 04/18/24  0558   NA  --  136 139   K  --  4.4 5.1   CL  --  101 104   CO2  --  24 26   BUN  --  23 23   MG 1.6  --  1.6     No results for input(s): INR, APTT in the last 72 hours.    Invalid input(s): PTP       Cathan Gearin K. Andrell Tallman, MD  [facility]  Office: [phone]

## 2024-04-18 NOTE — Progress Notes (Signed)
 Chaplain Services  Initial Visit    Start Time: 1120  End Time: 1200    Chaplain conducted an Initial Consultation and Spiritual Assessment for Susan [name], who is a 73 y.o.,female.  According to the patient's EMR Religious Affiliation is: None.     Patient stated that she had been depressed due to medical condition.  No Family present.  Active listening.  Offered prayer and assurance of continued prayers on patient's behalf.   Chart reviewed.    The following outcomes where achieved:  Patient shared limited information about both their medical narrative and spiritual journey/beliefs.  Chaplain confirmed Patient's Religious Affiliation.  Patient processed feeling about current hospitalization.  Patient expressed gratitude for chaplain's visit.    Assessment:  Patient does not have any religious/cultural needs that will affect patient's preferences in health care.  There are no spiritual or religious issues which require intervention at this time.     Plan:  Chaplains will continue to follow and will provide pastoral care on an as needed/requested basis.  Chaplain recommends bedside caregivers page Duty Chaplain if patient shows signs of acute spiritual or emotional distress.    KEVEN JINNY CANARD   Chaplain  Spiritual Care  [phone]

## 2024-04-18 NOTE — Progress Notes (Signed)
 INFECTIOUS DISEASE FOLLOW UP NOTE     Date of admission: 04/14/2024     Date of consult: April 15, 2024        ABX:      Current abx Prior abx    Cefepime /Vanco 7/14-4  Flagyl  7/15-3        ASSESSMENT:       Right foot abscess  -Unclear cause of swelling and pain that has been present for several months  -Has been evaluated by orthopedic  -Patient seen by [facility] specialist Dr. Curtistine Emperor with last visit 03/11/2024 for right contusion of dorsum of foot, lower extremity edema and right foot pronation deformity  -03/08/2024 MRI right hindfoot no fracture.  Mild multifocal osseous edema?  Altered gait.  And mechanical stress-related changes.  Mild intrinsic foot muscular atrophic and intramuscular edema.  Subcu edema along the dorsum of the foot  -xray no fracture  -S/p I&D by ED pending cultures   - Patient seen by podiatrist Dr. 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  -Has been following with neurosurgery at [facility] artery aneurysm  -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 and following with [facility] neurosurgery with plan for continued management   Ax- PCN -as a child   Co-morb- COPD, schizoaffective disorder, HTN,  depression,             RECOMMENDATIONS:      Complicated 73 year old female with significant comorbidities coming with bilateral feet swelling, right worse than left    - Patient afebrile, stable vitals, not requiring submental oxygen  - WBC 13.5, Hb 8.8, Plt 365, BUN/Cr 23/1.05, CRP 252, ESR 110  - Micro 7/17 right foot OR culture Gram stain negative, ANA IP, fungal IP, 7/15 urine culture <10K skin and genital contamination, wound culture ntd, 7/14 blood culture x 2 NTD  - Imaging 7/17 CT left foot cellulitis of lateral midfoot and forefoot with more focal 3.8 cm area of fluid at the level of midfoot suspicious for early developing abscess.  No evidence of osteo 7/15 retroperitoneal ultrasound unremarkable except partially exophytic simple appearing interpolar left renal cyst, 7/15 CT right foot subcu phlegmon/abscess lateral right hindfoot and 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 of the subjacent lateral margin of the calcaneus     Assessment     Right foot abscess  - Coming with swelling, pain of right foot   - Understand I&D in ED by provider and cultures negative so far  - CT concerning for abscess   - 7/17 s/p OR with I&D and placement of wound VAC   - Or cultures in progress   - Continue with local wound care, limb elevation and broad-spectrum antibiotic for now     Left foot swelling  - Tenderness on dorsal aspect  -CT suggestive of cellulitis with focal area of collection?  Abscess  -  Await further recommendation from podiatry    PAD  -Previous history of angio but recent ABI with monophasic waveforms in femoral and plan for angio per vascular    Bilateral leg edema  - Recent PVL 7/8 was negative for DVT but low threshold to repeat     Leukocytosis likely reactive and monitor     AKI?  CKD  - Avoid nephrotoxic medications and dose for current creatinine clearance  - Renal ultrasound without any stone or obstructionOther details as above     Other details as above    Plan  -Continue IV Vanco, cefepime  and Flagyl  for  now  - Await OR culture results on right foot from 7/17  -Local wound care/wound VAC per podiatry  -Await further input from podiatry regarding left foot abscess  -Await further vascular evaluation and intervention  - Keep legs elevated on pillows  - Monitor serial labs  - Monitor for any ADE  - Remains at risk for worsening    ID service will see patient again on Monday and can be reached at [phone].  Dr. Norleen Julieanne Meadows is covering and please call for any question or concern.                          MICROBIOLOGY:      7/14 Blcx x2 IP              Wd cx IP  7/15 Ucx IP     LINES AND CATHETERS:      PIV     SUMMARY:      Susan [name] is a 73 year old African-American female with past medical history of COPD, schizoaffective disorder, hypertension, depression, PAD, peripheral neuropathy was admitted on 7/14 with worsening leg pain.     Patient is not a good historian and says lives with her daughter.  She has been almost bedbound for quite some time. Pt has been following with [facility] for basilar artery aneurysm and conservative Mx.  Patient also developed right leg swelling for which she was referred to Ortho and eventually podiatry.  It was not thought to be the part of her back leading to right leg swelling and neuropathy.  Patient also following with vascular and had procedure done in March with good runoff and did not think was concern for pain and swelling.  Patient last seen by podiatry 03/11/2024 and plan for conservative management.      She was seen in ED on 8 July with leg swelling.  Workup showed negative PVL for DVT and chest x-ray without any significant finding.  She was noted with pitting edema and was given some Lasix  and asked to follow-up with PCP for adjustment in medications.  Patient returned 7/14 with worsening swelling of the legs, right worse than left.  Workup showed stable vitals, afebrile state.  WBC elevated at 19, Hb 9.8, PLT 354, BUN 34, CR 1.43, AST 17, ALT 14.  Blood cultures were  obtained.  Patient had bilateral feet x-ray which showed no fracture or concern for osteo.  Chest x-ray was suggestive of left basilar opacity with developing pneumonia versus atelectasis.  Patient had I&D in the ED.  Podiatry was consulted and CT was ordered.  Wound culture in progress.  Patient on broad-spectrum antibiotic with cefepime  and Vanco.  ID was asked for further evaluation management.       SUBJECTIVE :     Interval notes reviewed.  Patient afebrile.  Continues  to complain of pain in right foot.  Updates provided.  Patient tells me will be going to OR for the left foot to but not sure when.  No family at bedside.    OBJECTIVE     BP (!) 109/56   Pulse 82   Temp 97.9 F (36.6 C) (Temporal)   Resp 18   Ht 1.702 m (5' 7)   Wt 81.6 kg (179 lb 14.3 oz)   SpO2 95%   BMI 28.18 kg/m     Temp (24hrs), Avg:98.6 F (37 C), Min:97.9 F (36.6 C), Max:99 F (37.2 C)    General: Fairly developed and nourished 73 y.o. year-old, female, in no acute distress  HEENT: Normocephalic, anicteric sclerae, Pupils equal, round reactive to light, no oropharyngeal lesions. No sinus tenderness.  Neck: Supple, no lymphadenopathy, masses or thyromegaly  Chest: Symmetrical expansion  Lungs: Clear to auscultation bilaterally, no dullness  Heart: Regular rhythm, no murmur, no rub or gallop, No JVD  Abdomen: Soft, non-tender,non distended, no organomegaly, BS+  Musculoskeletal: Limited exam of feet given pain.  Right leg in postop dressing and wound VAC not touch.  Left foot remains swollen mainly on dorsal aspect  CNS: AAOx3.  Follows simple commands.  SKIN: No obvious   skin lesion or rash. Dry, warm, intact         MEDICATIONS:     Current Facility-Administered Medications   Medication Dose Route Frequency Provider Last Rate Last Admin    vancomycin  (VANCOCIN ) 1000 mg in sodium chloride  0.9% 250 mL IVPB  1,000 mg IntraVENous Q24H Matriano, Norleen Deward DEL, MD   Stopped at 04/18/24 0322    [START ON 04/19/2024] Vancomycin   Trough Reminder  1 each Other Once Matriano, Norleen Deward DEL, MD        senna (SENOKOT) tablet 8.6 mg  1 tablet Oral Daily PRN Matriano, Norleen Deward DEL, MD        docusate sodium  (COLACE) capsule 100 mg  100 mg Oral BID PRN Matriano, Norleen Deward DEL, MD        ondansetron  (ZOFRAN ) injection 4 mg  4 mg IntraVENous Q6H PRN Matriano, Norleen Deward DEL, MD        acetaminophen  (TYLENOL ) tablet 650 mg  650 mg Oral Q6H PRN Matriano, Norleen Deward DEL, MD        melatonin tablet 3 mg  3 mg Oral Nightly PRN Matriano, Norleen Deward DEL, MD        naloxone  (NARCAN ) injection 0.4 mg  0.4 mg IntraVENous PRN Matriano, Norleen Deward DEL, MD        glucagon  injection 1 mg  1 mg IntraMUSCular PRN Matriano, Norleen Deward DEL, MD        dextrose  50 % IV solution  20-30 mL IntraVENous PRN Matriano, Norleen Deward DEL, MD        [Held by provider] heparin  (porcine) injection 5,000 Units  5,000 Units SubCUTAneous BID Murrell Norleen Deward DEL, MD   5,000 Units at 04/16/24 9081    albuterol  (ACCUNEB ) nebulizer solution 2.5 mg  2.5 mg Nebulization Q4H PRN Matriano, Norleen Deward DEL, MD        benzonatate  (TESSALON ) capsule 100 mg  100 mg Oral TID PRN Matriano, Norleen Deward DEL, MD        cefepime  (MAXIPIME ) 1,000 mg in sodium chloride  0.9 % 50 mL IVPB (mini-bag)  1,000 mg IntraVENous Q12H Matriano, Norleen Deward DEL, MD   Stopped at 04/18/24 0209    Vancomycin  Trough Due  1  each Other Once Matriano, Norleen Deward DEL, MD        *Pharmacy to Dose Vancomycin   1 each Other RX Placeholder Matriano, Norleen Deward DEL, MD        morphine  (PF) injection 1 mg  1 mg IntraVENous Q3H PRN Matriano, John Paul H, MD   1 mg at 04/16/24 1150    ipratropium 0.5 mg-albuterol  2.5 mg (DUONEB ) nebulizer solution 1 Dose  1 Dose Inhalation TID RT Josepha Planas, DO   1 Dose at 04/18/24 0734    sodium hypochlorite (DAKINS) 0.125 % external solution   Irrigation Daily Anastasio Adine LABOR, DPM        metroNIDAZOLE  (FLAGYL ) 500 mg in 0.9% NaCl 100 mL IVPB premix  500 mg IntraVENous Q8H Meade Viviane RAMAN, MD   Stopped at 04/18/24 9356     oxyCODONE -acetaminophen  (PERCOCET ) 7.5-325 MG per tablet 1 tablet  1 tablet Oral Q4H PRN Josepha Planas, DO   1 tablet at 04/18/24 0549         Labs: Results:   Chemistry Recent Labs     04/16/24  1121   NA 136   K 4.4   CL 101   CO2 24   BUN 23      CBC w/Diff Recent Labs     04/16/24  0615   WBC 15.8*   RBC 3.14*   HGB 9.2*   HCT 31.0*   PLT 328          RADIOLOGY :        Imaging  CT FOOT LEFT W CONTRAST  Result Date: 04/17/2024  HISTORY: Swelling and pain. TECHNIQUE: Helical acquisition CT images through the left foot was performed with contrast with multiplanar reformats. COMPARISON: None. FINDINGS: No visualized acute fracture or dislocation. No evidence of periosteal reaction or bony destruction to suggest osteomyelitis. There is extensive subcutaneous fat edema and stranding within the lateral soft tissues from the level of the mid foot to the forefoot. In particular, there is a prominent area of focal fluid measuring approximately 3.8 x 2.1 cm on image 20, series 5 that extends superficially to abut the skin and may represent a developing abscess. There is no definite visualized enhancing wall.     IMPRESSION: 1. Findings are compatible with cellulitis of the lateral midfoot and forefoot with more focal 3.8 cm area of fluid at the level of the midfoot that is suspicious for an early developing abscess as there is no discrete capsule. 2. No evidence of osteomyelitis. Electronically signed by: Lillia Hug, MD 04/17/2024 11:37 AM EDT          Workstation ID: [id number]     LE ARTERIAL  Result Date: 04/16/2024                                                                                                                                    Version: 1  Study ID: [id number]                                                       [facility]                                                        [address] [place], [place]  [address]                                                           Arterial Lower Extremity Report Name: Susan, [name] Date: 04/15/2024, 2: 28 PM MRN: [id number]                                                               Patient Location: [facility] Age: 66 Years                                                               DOB: 17-Jul-1951 (MM/DD/YYYY)                                                                               Gender: Female Performed By: Edilia Romelia Elden Sherrell, RVT                          Account #:: [id number] Ordering Physician: ANASTASIO CAIN A Reason For Study: Peripheral vascular disease.  INTERPRETATION SUMMARY 1. Moderate right lower extremity arterial insufficiency at the level of the iliac-femoral region. 2. Severe left lower extremity arterial insufficiency at the level of the femoral-popliteal region. 3. Digital pressures deferred due to patient's pain. QUALITY/PROCEDURE Arterial Physio ABI [phone]). A bilateral lower extremity arterial exam was performed. Continuous wave Doppler tracings were recorded from the common femoral, popliteal, posterior tibial and dorsalis pedis arteries bilaterally, and resting ankle brachial pressures were measured. Digital pressures deferred due to patient's pain. RIGHT LEG SEGMENTAL Brachial = 131 mmHg. Ankle Pressure (PT) = 81 mmHg. Ankle Pressure (DP) = 83 mmHg. Right ABI = 0.63. The lower extremity ankle brachial index is 0.63 on the right. The right lower extremity waveforms are monophasic in the femoral, the popliteal, the posterior tibial and the dorsalis pedis arteries. RIGHT DIGIT PPG Digit 1 flow is absent. LEFT LEG SEGMENTAL Brachial = 126 mmHg. Ankle (PT) = 59 mmHg. Ankle (DP) = 63 mmHg. Left ABI =  0.48. The lower extremity ankle brachial index is 0.48 on the left. The left lower extremity waveforms are multiphasic in the femoral, and monophasic in the popliteal, the posterior tibial and the dorsalis pedis arteries. LEFT DIGIT PPG Digit 1 flow is absent. ______________________________________________________________________________ Electronically signed by: Dr Blenda Fear, M.D                       04/16/2024, 10: 01 PM     US  RETROPERITONEAL COMPLETE  Result Date: 04/15/2024  INDICATION: Acute renal failure. COMPARISON: None. TECHNIQUE: Transabdominal grayscale and duplex sonography of the retroperitoneum performed. FINDINGS: RIGHT KIDNEY:  10.7 cm in length. (Normal range 9-13 cm) Normal renal parenchymal echogenicity. No obstruction, renal calculi or focal lesion. LEFT KIDNEY: 9.8 cm in length. (Normal range 9-13 cm) Normal renal parenchymal echogenicity.  And interpolar simple-appearing partially exophytic left renal cyst measures 2.6 x 2.6 x 2.1 cm. URINARY BLADDER: Bladder has a pre-void volume of 240.0 mL. (<50 mL WNL) Patient did not void for the exam. Neither ureteral jet(s) identified. No urinary bladder wall thickening or debris within the urinary bladder. (Normal wall thickness <3 mm distended, <5 mm underdistended.) Bladder contains a Foley catheter and is decompressed. PANCREAS:  Visualized pancreas is within normal limits. IVC: Patent. ABDOMINAL AORTA: Normal caliber abdominal aorta. COMMON ILIAC ARTERIES: Suboptimally visualized secondary to interposed bowel gas.     IMPRESSION: 1.  No hydronephrosis. Kidneys unremarkable sonographically with the exception of a partially exophytic simple-appearing interpolar left renal cyst. 2.  Urinary bladder unremarkable sonographically. 3.  Common iliac arteries above the visualized sonographically due to interposed bowel gas Electronically signed by: Alm Conte, MD 04/15/2024 8:34 PM EDT          Workstation ID: [id number]     CT FOOT RIGHT W  CONTRAST  Result Date: 04/15/2024  Indication: Right foot infection. Evaluate for abscess. Comparison: None available Technique: Computed tomographic imaging of the right foot was performed following IV contrast administration. Coronal and sagittal reconstructions were created from the initial axial data set and submitted for interpretation. Findings: Complex gas and fluid containing abscess//phlegmon in the subcutaneous lateral hindfoot spanning approximately 4.4 cm craniocaudal by 2 x 1.5 cm transverse. This is positioned posterior to the peroneal tendons which are intact and not overtly involved. There is subtle loss of bony cortex/rarefaction at the lateral margin of the subjacent calcaneus concerning for osteomyelitis.     IMPRESSION: 1. 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 Electronically signed by: Glade Reeve, MD 04/15/2024 2:16 PM EDT          Workstation ID: [id number]     XR FOOT RIGHT (2 VIEWS)  Result Date: 04/15/2024  Exam: 2 views of the right foot. Clinical indications:  PAIN. RULE OUT OSTEOMYELITIS. Right foot pain and swelling. Comparison:   04/15/2024;  Findings:  No fracture.  No dislocation. No radiopaque foreign bodies. No bone erosions.     Impression:  No fracture. No bone erosions to suggest osteomyelitis. Electronically signed by: Celine Rear, MD 04/15/2024 12:52 AM EDT          Workstation ID: [id number]     XR FOOT LEFT (2 VIEWS)  Result Date: 04/15/2024  Exam: 2 views of the left foot. Clinical indications:  PAIN right foot. Swelling. Possible osteomyelitis. Comparison:  None Findings:  No fracture.  No dislocation. No bone erosions. No radiopaque foreign bodies.     Impression:  No fracture. Electronically signed by: Celine Rear, MD 04/15/2024 12:51 AM EDT          Workstation ID: [id number]     XR CHEST PORTABLE  Result Date: 04/15/2024  Exam: AP portable chest Clinical indication:  SEPSIS Comparison: 04/08/2024;  Results:  Left basilar opacity. No pneumothorax. Heart normal.  Mediastinal contours are normal. No free air is seen under the hemidiaphragms.   Osseous structures intact.     IMPRESSION: Left basilar opacity. Developing pneumonia versus atelectasis. Electronically signed by: Celine Rear, MD 04/15/2024 12:41 AM EDT          Workstation ID: [id number]     VL DUP LOWER EXTREMITY VENOUS BILATERAL  Result Date: 04/09/2024                                                                                                                                    Version: 1                                                                                                                                   Study ID: [id number]  [facility]                                                       [address] [place], [place]  [address]                                                      Lower Extremity Venous Duplex Report Name: SHAMICKA, INGA Date: 04/08/2024, 2: 42 PM MRN: [id number]                                                               Patient Location: [facility] Age: 89 Years                                                               DOB: 02-02-1951 (MM/DD/YYYY)                                                                               Gender: Female Performed By: Kylee R. Holloman, RVT                                       Account #:: [id number] Ordering Physician: [facility], [facility] Reason For Study: Lower extremity pain and swelling                                                     INTERPRETATION SUMMARY 1. No evidence of deep vein thrombosis in the bilateral lower extremities. QUALITY/PROCEDURE Complete bilateral venous duplex performed. Quality of the study is good. Location: ER. RIGHT LEG  The deep venous system of  the right lower extremity was examined using duplex ultrasound. B-mode imaging demonstrated normal compressibility of the right common femoral, femoral, popliteal, posterior tibial and peroneal veins. Doppler flow signals were spontaneous and phasic at all levels. RIGHT SAPHENOUS VEINS Right great saphenous vein at the saphenofemoral junction demonstrates normal compressibility with spontaneous and phasic venous hemodynamics. LEFT LEG The deep venous system of the left lower extremity was examined using duplex ultrasound. B-mode imaging demonstrated normal compressibility of the left common femoral, femoral, popliteal, posterior tibial and peroneal veins. Doppler flow signals were spontaneous and phasic at all levels. LEFT LEG SAPHENOUS VEINS Left great saphenous vein at the saphenofemoral junction demonstrates normal compressibility with spontaneous and phasic venous hemodynamics. ______________________________________________________________________________ Electronically signed by: Dr Blenda Fear, M.D                       04/09/2024, 8: 57 AM     XR CHEST (2 VW)  Result Date: 04/08/2024  History: Shortness of breath Comparison: None Technique: PA and lateral radiographs of the chest were obtained. Two views, 2 exposures total. Findings: The lungs and pleural spaces are clear. Normal cardiomediastinal silhouette. Degenerative changes of the thoracic spine.     IMPRESSION: No acute cardiopulmonary disease. Electronically signed by: Norleen Gleason 04/08/2024 11:20 AM EDT          Workstation ID: [id number]     ECHO CARDIOGRAM COMPLETE-IN HOUSE  Result Date: 03/21/2024  CONCLUSIONS   * Left ventricular systolic function is normal with an ejection fraction of 66 % by Simpson's biplane.   * Left ventricular chamber size is normal.   * Left ventricular diastolic function: indeterminate.   * Right ventricular systolic function is normal.   * Right ventricular chamber dimension is normal.   * There  is mild aortic valve stenosis with a peak velocity of 2.8 m/s, mean gradient of 15 mmHg, and aortic valve area of 1.59 cm2.   * There is mild aortic valve regurgitation.   * Aortic valve regurgitation pressure halftime is 570 ms.   * There is trace mitral valve regurgitation.   * There is moderate tricuspid valve regurgitation.   * Moderate pulmonary hypertension, estimated pulmonary arterial systolic pressure is 51 mmHg.   * There is no pericardial effusion.   * The aortic root at the sinus of Valsalva is normal measuring 2.95 cm with an index of 1.5.   * The proximal ascending aorta is normal measuring 2.88 cm with an index of 1.4 cm/m2.   * For the indication of aortic valve calcification, findings are as noted. Comparison   * Compared to prior study from 09/14/17, changes include new mild aortic stenosis.   * Prior estimated pulmonary arterial systolic pressure was 48 mmHg.   * Trace aortic and tricuspid regurgitation increased. Patient Info Name:     Susan [name] Age:     60 years DOB:     10/30/1950 Gender:     Female MRN:     [id number] Ht:     67 in Wt:     188 lb BSA:     2.03 m2 BP:     138 /     72 mmHg Heart Rhythm:     Sinus Rhythm Exam Date:     03/21/2024 2:57 PM Patient Status:     OP Exam Type:     ECHO CARDIOGRAM COMPLETE Indications     Aortic valve calcification - Left Ventricle   Left ventricular  systolic function is normal with an ejection fraction of 66 % by Simpson's biplane. Left ventricular segmental wall motion is normal. Left ventricular chamber size is normal. Left ventricular diastolic function: indeterminate. Right Ventricle   Tricuspid annular plane systolic excursion (TAPSE) is normal, 2.8 cm. Right ventricular systolic function is normal. Right ventricular chamber dimension is normal. Ventricular Septum   Intact interventricular septum visualized by color Doppler imaging. Left Atrium   Left atrial chamber is normal with a left atrial volume index biplane method of disk (BP MOD) of  24 ml/m^2. Right Atrium   Right atrial chamber size is normal. Atrial Septum   Intact interatrial septum visualized by color Doppler imaging. Aortic Valve   The aortic valve is tricuspid. There is mild aortic valve stenosis with a peak velocity of 2.8 m/s, mean gradient of 15 mmHg, and aortic valve area of 1.59 cm2. There is mild aortic valve regurgitation. Aortic valve regurgitation pressure halftime is 570 ms. The aortic valve dimensionless index by VTI is 0.56. Left ventricular outflow tract stroke volume index is 42.14 ml/m2. Pulmonic Valve   The pulmonic valve is not well visualized. There is no pulmonic valve stenosis. There is mild pulmonic regurgitation. Mitral Valve   The mitral valve has normal leaflets. There is no mitral valve stenosis. There is trace mitral valve regurgitation. Tricuspid Valve   The tricuspid valve leaflets are normal. There is no tricuspid valve stenosis. There is moderate tricuspid valve regurgitation. Moderate pulmonary hypertension, estimated pulmonary arterial systolic pressure is 51 mmHg. Pericardium/Pleural   There is no pericardial effusion. Inferior Vena Cava   Normal inferior vena cava diameter with >50% collapse upon inspiration consistent with normal right atrial pressure, 3 mmHg. Aorta   The proximal ascending aorta is normal measuring 2.88 cm with an index of 1.4 cm/m2. The aortic root at the sinus of Valsalva is normal measuring 2.95 cm with an index of 1.5. The aortic measurements are indexed to body surface area. Left Ventricular Outflow Tract ---------------------------------------------------------------------- Name                                 Value        Normal ---------------------------------------------------------------------- LVOT 2D ---------------------------------------------------------------------- LVOT Diameter                       1.9 cm               LVOT Doppler ---------------------------------------------------------------------- LVOT Peak  Velocity              124.0 cm/s               LVOT Peak Gradient                6.0 mmHg               LVOT Mean Gradient                3.0 mmHg               LVOT VTI                           30.2 cm                 LVOT Stroke Volume                 85.6 ml  LVOT Stroke Volume Index        42.1 ml/m2               LVOT Area                          2.8 cm2 Mitral Valve ---------------------------------------------------------------------- Name                                 Value        Normal ---------------------------------------------------------------------- MV Diastolic Function ---------------------------------------------------------------------- MV E Peak Velocity               83.1 cm/s               MV A Peak Velocity              117.0 cm/s               MV E/A                                 0.7       0.8-2.0 MV Decel Time PW                    232 ms               MV Annular TDI ---------------------------------------------------------------------- MV Septal e' Velocity             6.5 cm/s         >=7.0 MV E/e' (Septal)                      12.8               MV Lateral e' Velocity            7.9 cm/s        >=10.0 MV E/e' (Lateral)                     10.5               MV e' Average                     7.2 cm/s               MV E/e' (Average)                     11.7        <=14.0 Tricuspid Valve ---------------------------------------------------------------------- Name                                 Value        Normal ---------------------------------------------------------------------- TV Regurgitation Doppler ---------------------------------------------------------------------- TR Peak Velocity                345.0 cm/s       <=280.0 TR Peak Gradient                 48.0 mmHg               Estimated PAP/RSVP ---------------------------------------------------------------------- RA Pressure                         3  mmHg            <8 PA Systolic Pressure               51  mmHg           <35 RV Systolic Pressure               51 mmHg           <36 Aorta ---------------------------------------------------------------------- Name                                 Value        Normal ---------------------------------------------------------------------- Ascending Aorta ---------------------------------------------------------------------- Sinus of Valsalva Diameter          3.0 cm               Prox Asc Ao Diameter                2.9 cm         <=3.1 Prox Asc Ao Diameter Index       1.4 cm/m2         <=1.9 Venous ---------------------------------------------------------------------- Name                                 Value        Normal ---------------------------------------------------------------------- IVC/SVC ---------------------------------------------------------------------- IVC Diameter (Exp 2D)               1.9 cm         <=2.1 Aortic Valve ---------------------------------------------------------------------- Name                                 Value        Normal ---------------------------------------------------------------------- AV Doppler ---------------------------------------------------------------------- AV Peak Velocity                   2.8 m/s               AV Peak Gradient                 30.0 mmHg               AV Mean Gradient                 15.0 mmHg               AV VTI                             54.0 cm               AV Area (Cont Eq VTI)              1.6 cm2         >=3.0 AV Area Index (Cont Eq VTI)     0.8 cm2/m2               AV Area (Cont Eq Vel)              1.3 cm2               AV Area Index (Cont Eq Vel)     0.6 cm2/m2                AV Dimensionless Index (  Peak Velocity)                        0.4                  AV Dimensionless Index (VTI)                                  0.6               AV Regurgitation 2D ---------------------------------------------------------------------- LVOT Area                          2.8 cm2               AV  Regurgitation Doppler ---------------------------------------------------------------------- AR Decel Slope                  192.0 cm/s2               AR PHT                              570 ms Ventricles ---------------------------------------------------------------------- Name                                 Value        Normal ---------------------------------------------------------------------- LV Dimensions 2D/MM ----------------------------------------------------------------------  IVS Diastolic Thickness (2D)                                1.3 cm       0.5-0.9 LVID Diastole (2D)                  4.5 cm       3.8-5.2  LVPW Diastolic Thickness (2D)                                1.3 cm       0.5-0.9 LVID Systole (2D)                   3.1 cm       2.2-3.5 LVID Diastolic Index (2D)        2.2 cm/m2       2.3-3.1 LVOT Diameter                       1.9 cm               LV Fractional Shortening/Ejection Fraction 2D/MM ---------------------------------------------------------------------- LV EF (2D Teichholz)                  58 %               LV EF (BP MOD)                        66 %         54-74  LV Fractional Shortening (2D)                                32.9 %     27.0-45.0  LV  Diastolic Volume Index (BP MOD)                        24.7 ml/m2     29.0-61.0 LV SV (2C MOD)                     28.0 ml               LV SV (BP MOD)                     33.3 ml               LV SV (Cubed)                      60.3 ml               LV SV (Teich)                      53.5 ml               LV EDV (Cubed)                     90.9 ml               LV ESV (Cubed)                     30.6 ml               LV EDV (Teich)                     92.2 ml               LV ESV (Teich)                     38.8 ml               RV Dimensions 2D/MM ----------------------------------------------------------------------  RV Basal Diastolic Dimension                           3.2 cm       2.5-4.1 TAPSE                                2.8 cm         >=1.7 TAPSV                            10.2 cm/s               RV/LV Diam Ratio (Diastole)            0.9 Atria ---------------------------------------------------------------------- Name                                 Value        Normal ---------------------------------------------------------------------- LA Dimensions ---------------------------------------------------------------------- LA Dimension (2D)                   3.0 cm       2.7-3.8 LA Dimen Index (2D)              1.5 cm/m2  LA Volume (BP A-L)                 49.6 ml               LA Volume Index (BP A-L)        24.4 ml/m2         <35.0 LA Volume Index (BP MOD)        23.5 ml/m2        <=34.0 RA Dimensions ----------------------------------------------------------------------  RA Diastolic Major Axis Length (4C)                         4.4 cm                RA Diastolic Major Axis Length Index (4C)                      0.2               RA Area (4C)                      14.4 cm2         <21.0 RA ESV Index (4C MOD)           19.3 ml/m2     15.0-27.0 Wall Motion Scoring Wall Motion Scoring Index:     1.00 Technical Quality:     Fair Complete transthoracic echocardiogram performed with 2D imaging, color Doppler, and spectral Doppler. Staff Referring Provider:     Garnette To Ordering Provider:     Glendia DELENA Marina MD Sonographer:     Luke ONEIDA Finder RDCS [id number] Report Signatures Finalized by Glendia DELENA Marina  MD on 03/21/2024 03:44 PM       I have independently reviewed lab studies and imgaing as well as review of nursing notes and physican notes from the past 24 hours.    Dragon medical dictation software was used for portions of this report. Unintended errors may occur.     VIVIANE GORMAN GORE, MD  April 18, 2024    [facility] Infectious Disease   Office Phone:[phone]  Fax:[phone]

## 2024-04-18 NOTE — Progress Notes (Addendum)
 Assessment/Plan:     Patient seen for complicated medical problems as listed below :    Right foot abscess   S/p I and D in ER  Left foot swelling  PAD bilateral lower extremities  Chronic hypoxic resp failure  COPD  Possible pneumonia  Plan     Vascular surgery following/ABI done right 0.63/left 0.48  Plan for lower extremity CT as per vascular surgery patient does not have to be n.p.o.  Status post right heel debridement and wound VAC placement 7/17  CT scan of left foot reveals a possible 3.8 cm developinG abscess  Infectious disease following  Empiric IV vancomycin /cefepime /Flagyl   Leukocytosis improving  Awaiting deep cultures/path  Continue bronchodilators  O2 supplementation  Dvt ppx with heparin  subcu, hold for I&D tomorrow   Continue home regimen for otherwise chronic, stable medical conditions as noted above.     Repeat CBC/electrolytes in a.m.    Reason for continued hospitalization: Ongoing workup further interventions needed on lower extremity abscesses as as well as peripheral arterial disease    EXAM:  GENERAL: No acute distress  RESPIRATORY: Bilateral BS present. No rales or rhonchi.  No wheezing  CARDIOVASCULAR: S1 and S2 present, regular. No murmur  ABDOMEN: Soft and nontender with positive bowel sounds.   NEUROLOGICAL: Cranial nerves II through XII grossly intact. No focal weakness.  Musculoskeletal: Right heel clean bandages/no draining abscess left foot    Subjective:     Patient seen and examined.  Discussed plan of care with ongoing IV antibiotics and wound VAC with possible further evaluation and need for procedure pending podiatry evaluation of left foot.    She denied chest pain and or shortness of breath fevers chills rigors    12 point review of systems otherwise negative    Vitals:    04/18/24 0501 04/18/24 0549 04/18/24 0735 04/18/24 0925   BP: (!) 109/56   (!) 108/55   Pulse: 89  82 76   Resp: 18 16 18 18    Temp: 97.9 F (36.6 C)   98.2 F (36.8 C)   TempSrc: Temporal   Temporal    SpO2: 90%  95% 100%   Weight:       Height:           Recent Labs     04/18/24  0558   WBC 13.5*   HGB 8.8*   HCT 30.1*   MCV 99.7*   PLT 365     Recent Labs     04/18/24  0558   NA 139   K 5.1   CL 104   CO2 26   ANIONGAP 9   CALCIUM  9.8   BUN 23   CREATININE 1.05*   GLUCOSE 85     Recent Labs     04/16/24  1121   AST 14.0   ALKPHOS 82   BILITOT 0.40   ALT 11     Lab Results   Component Value Date/Time    COLORU Yellow 04/15/2024 03:02 AM    CLARITYU Clear 04/15/2024 03:02 AM    GLUCOSEU Negative 04/15/2024 03:02 AM    BILIRUBINUR Negative 04/15/2024 03:02 AM    KETUA Negative 04/15/2024 03:02 AM    BLOODU Trace-lysed 04/15/2024 03:02 AM    PHUR 5.5 04/15/2024 03:02 AM    NITRU Negative 04/15/2024 03:02 AM    LEUKOCYTESUR Trace 04/15/2024 03:02 AM     No results for input(s): POCGLU in the last 72 hours.  Cassidee Deats Md  [facility]

## 2024-04-18 NOTE — Care Coordination-Inpatient (Signed)
 [facility] Manager Initial Assessment        Home/Support/Situation prior to admission: lives with DTR in a 2 story home     Current family/patient discharge plan or goals of care: to get better and get back home        List DME prior to admission: rollator and shower    also has oxygen at home , need      Home address, phone number, insurance, emergency contacts verified:  yes        PCP:  To, Garnette RAMAN, MD     PCP Confirmed Active: yes                         Agreeable to [facility] for DC medications: yes    Refill [facility]acy: [facility]                Dialysis patient?  no     Active Medicaid?  no               Is patient planning to go straight LTC at DC?  no      Agreeable to [facility] and Supportive Care if [facility] is recommended at DC (list alternate agency if agreeable to outside agency):  yes        Agreeable to [facility]-acute care facility if recommended at DC? Yes, if needed will need auth     Signed:   Karna Scull , Case Manager  April 18, 2024  11:05 AM  Department Phone: [phone]    04/18/24 1058   Service Assessment   Patient Orientation Alert and Oriented;Person;Place;Situation;Self   Cognition Alert   History Provided By Patient   Primary Caregiver Family   Support Systems Children   PCP Verified by CM Yes   Last Visit to PCP Within last 6 months   Prior Functional Level Assistance with the following:;Housework;Cooking;Shopping;Toileting;Bathing   Current Functional Level Assistance with the following:;Housework;Cooking;Shopping;Toileting;Bathing   Can patient return to prior living arrangement Unknown at present   Ability to make needs known: Good   Family able to assist with home care needs: Other (comment)   Would you like for me to discuss the discharge plan with any other family members/significant others, and if so, who? Yes   Financial Resources Medicare   Social/Functional History   Lives With Daughter   Type of Home House   Home Layout Two level    Photographer   Receives Help From Family   Prior Level of Assist for ADLs Needs assistance   Toileting Independent   Prior Level of Assist for [employer] Needs assistance   Ambulation Assistance Needs assistance   Prior Level of Assist for Transfers Civil Service fast streamer No   Discharge Planning   Type of Residence [facility]   Living Arrangements Children   Current Services Prior To Admission None   Potential Assistance Needed Home Care;Skilled Nursing Facility  (TBD)   DME Ordered? No   Potential Assistance Purchasing Medications No   Type of Home Care Services   (TBD)   Patient expects to be discharged to:   (TBD)   One/Two Story Residence Two Chief Financial Officer At/After Discharge   Transition of Care Consult (CM Consult) Home Health;SNF   Internal Home Health Yes  (if home health is recommended)  Mode of Transport at Discharge BLS   Condition of Participation: Discharge Planning   The Plan for Transition of Care is related to the following treatment goals: (S)  DC plan home with home health vs SNF pending PT OT evals

## 2024-04-18 NOTE — Progress Notes (Signed)
 Chaplain Services  Unable to Visit Patient    Start Time: 13:20  End Time: 13:25    Volunteer Chaplain attempted to conduct a consultation and Spiritual Assessment for Susan [name], who is a 72 y.o.,female.  According to the patient's EMR Religious Affiliation is: None.     Patient is with staff, and is not available to be assessed at this time.  No Family present.  Offered prayer remotely on patient's behalf.     Assessment:  Patient has no known religious/cultural needs that will affect patient's preferences in health care.  Patient has no known spiritual or religious issues which require intervention at this time.     Plan:  Chaplains will continue to follow and will provide pastoral care on an as needed/requested basis.  Volunteer Chaplain recommends bedside caregivers page Duty Chaplain if patient shows signs of acute spiritual or emotional distress.   Chaplain charted for [employer].    Channing Kevon Kerry   Spiritual Care  [phone]

## 2024-04-18 NOTE — Progress Notes (Signed)
 PHYSICAL THERAPY    Time  PT Charge Capture  Rehab Caseload Tracker        Patient: Susan [name] (73 y.o. female)  Room: 5212/5212    Primary Diagnosis: Foot abscess [L02.619]  Right leg pain [M79.604]  Abscess of right foot [L02.611]   Procedure(s) (LRB):  RIGHT HEEL IRRIGATION AND DEBRIDEMENT WITH WOUND VAC APPLICATION (Right) 1 Day Post-Op  Date of Admission: 04/14/2024   Insurance: Payor: BCBS MEDICARE / Plan: ANTHEM FULL DUAL ADVANTAGE 2 / Product Type: *No Product type* /      Date: 04/18/2024  Time: 8892-8881    OBJECTIVE:     Orders, labs, and chart reviewed on Susan [name]. Discussed with patient's nurse.    Patient was not seen for skilled physical therapy evaluation, secondary to RN advising hold. Pt pending possible procedure for LLE today and continues to c/o severe pain in BLEs.    Plan discussed with pt. Will see pt following further workup/procedures for LLE.     PLOF information obtained. Pt lives with dtr in [facility] with downstairs bedroom and 2 small entry steps. She was independent with mobility and ADLs but has had functional decline in recent weeks due to ongoing BLE pains. She used a Information systems manager for mobility.    Pt endorsed feeling overwhelmed. Pt tearful during encounter. Pt requested to talk to chaplain. Glass blower/designer informed of request.    PLAN:     Our services will follow up as patient's condition and/or schedule permit.    Susan [name], PT   April 18, 2024

## 2024-04-19 ENCOUNTER — Inpatient Hospital Stay: Admit: 2024-04-19 | Payer: Medicare (Managed Care) | Primary: Family Medicine

## 2024-04-19 LAB — CBC WITH AUTO DIFFERENTIAL
Basophils: 0.2 % (ref 0–3)
Eosinophils: 1.4 % (ref 0–5)
Hematocrit: 27.1 % — ABNORMAL LOW (ref 35.0–47.0)
Hemoglobin: 7.9 g/dL — ABNORMAL LOW (ref 11.0–16.0)
Immature Granulocytes %: 0.6 % (ref 0.0–3.0)
Lymphocytes: 6.8 % — ABNORMAL LOW (ref 28–48)
MCH: 28.7 pg (ref 25.4–34.6)
MCHC: 29.2 g/dL — ABNORMAL LOW (ref 30.0–36.0)
MCV: 98.5 fL — ABNORMAL HIGH (ref 80.0–98.0)
MPV: 11 fL — ABNORMAL HIGH (ref 6.0–10.0)
Monocytes: 7.6 % (ref 1–13)
Neutrophils Segmented: 83.4 % — ABNORMAL HIGH (ref 34–64)
Nucleated RBCs: 0 (ref 0–0)
Platelets: 351 1000/mm3 (ref 140–450)
RBC: 2.75 M/uL — ABNORMAL LOW (ref 3.60–5.20)
RDW: 55.6 — ABNORMAL HIGH (ref 36.4–46.3)
WBC: 12.7 1000/mm3 — ABNORMAL HIGH (ref 4.0–11.0)

## 2024-04-19 LAB — BASIC METABOLIC PANEL
Anion Gap: 9 mmol/L (ref 5–15)
BUN: 20 mg/dL (ref 9–23)
CO2: 25 meq/L (ref 20–31)
Calcium: 9.2 mg/dL (ref 8.7–10.4)
Chloride: 106 meq/L (ref 98–107)
Creatinine: 0.78 mg/dL (ref 0.55–1.02)
GFR African American: >60
GFR Non-African American: >60
Glucose: 100 mg/dL (ref 74–106)
Potassium: 4.7 meq/L (ref 3.5–5.1)
Sodium: 140 meq/L (ref 136–145)

## 2024-04-19 LAB — MAGNESIUM: Magnesium: 1.5 mg/dL — ABNORMAL LOW (ref 1.6–2.6)

## 2024-04-19 MED ORDER — IPRATROPIUM-ALBUTEROL 0.5-2.5 (3) MG/3ML IN SOLN
0.5-2.5 | RESPIRATORY_TRACT | Status: DC | PRN
Start: 2024-04-19 — End: 2024-05-02

## 2024-04-19 MED ORDER — ALBUTEROL SULFATE (2.5 MG/3ML) 0.083% IN NEBU
RESPIRATORY_TRACT | Status: DC | PRN
Start: 2024-04-19 — End: 2024-04-28

## 2024-04-19 MED ORDER — IOPAMIDOL 76 % IV SOLN
76 | Freq: Once | INTRAVENOUS | Status: AC | PRN
Start: 2024-04-19 — End: 2024-04-19
  Administered 2024-04-19: 13:00:00 120 mL via INTRAVENOUS

## 2024-04-19 MED FILL — CEFEPIME HCL 1 G IJ SOLR: 1 g | INTRAMUSCULAR | Qty: 1000 | Fill #0

## 2024-04-19 MED FILL — OXYCODONE-ACETAMINOPHEN 7.5-325 MG PO TABS: 7.5-325 mg | ORAL | Qty: 1 | Fill #0

## 2024-04-19 MED FILL — METRONIDAZOLE 500 MG/100ML IV SOLN: 500 MG/100ML | INTRAVENOUS | Qty: 100 | Fill #0

## 2024-04-19 MED FILL — HEPARIN SODIUM (PORCINE) 5000 UNIT/ML IJ SOLN: 5000 [IU]/mL | INTRAMUSCULAR | Qty: 1 | Fill #0

## 2024-04-19 MED FILL — VANCOMYCIN (VANCOCIN) 1000 MG IN SODIUM CHLORIDE 0.9% 250 ML IVPB: Qty: 250 | Fill #0

## 2024-04-19 MED FILL — IPRATROPIUM-ALBUTEROL 0.5-2.5 (3) MG/3ML IN SOLN: 0.5-2.5 (3) MG/3ML | RESPIRATORY_TRACT | Qty: 3 | Fill #0

## 2024-04-19 MED FILL — ISOVUE-370 76 % IV SOLN: 76 % | INTRAVENOUS | Qty: 120 | Fill #0

## 2024-04-19 NOTE — Progress Notes (Addendum)
 Assessment/Plan:     Patient seen for complicated medical problems as listed below :    Right foot abscess   S/p I and D in ER  Left foot swelling  PAD bilateral lower extremities  Chronic hypoxic resp failure  COPD  Possible pneumonia  Plan     Vascular surgery following/ABI done right 0.63/left 0.48  Plan for lower extremity CT as per vascular surgery patient does not have to be n.p.o./possible arteriogram with IR early next week depending on results of CT scan  Status post right heel debridement and wound VAC placement 7/17  CT scan of left foot reveals a possible 3.8 cm developinG abscess/awaiting further recommendations from podiatry  Infectious disease following  Empiric IV vancomycin /cefepime /Flagyl   Leukocytosis improving  Awaiting deep cultures/path/Gram stain negative  Continue bronchodilators  O2 supplementation  Dvt ppx with heparin   Continue home regimen for otherwise chronic, stable medical conditions as noted above.     Repeat CBC/electrolytes in a.m.    Reason for continued hospitalization: Ongoing workup further interventions needed on lower extremity abscesses as as well as peripheral arterial disease    EXAM:  GENERAL: No acute distress  RESPIRATORY: Bilateral BS present. No rales or rhonchi.  No wheezing  CARDIOVASCULAR: S1 and S2 present, regular. No murmur  ABDOMEN: Soft and nontender with positive bowel sounds.   NEUROLOGICAL: Cranial nerves II through XII grossly intact. No focal weakness.  Musculoskeletal: Right heel clean bandages/no draining abscess left foot    Subjective:     Patient seen and examined no acute complaints at present point in time.  Denies any chest pain and/or shortness of breath no fever chills or rigors overnight    12 point review of systems otherwise negative    Vitals:    04/18/24 2051 04/19/24 0107 04/19/24 0405 04/19/24 0908   BP:  123/69 (!) 114/48 126/61   Pulse: 86 84 76 77   Resp: 22 18 18 19    Temp:  99.5 F (37.5 C) 99.1 F (37.3 C) 97.3 F (36.3 C)    TempSrc:  Temporal Temporal Oral   SpO2: 98% 100% 99% 100%   Weight:       Height:           Recent Labs     04/19/24  0523   WBC 12.7*   HGB 7.9*   HCT 27.1*   MCV 98.5*   PLT 351     Recent Labs     04/19/24  0523   NA 140   K 4.7   CL 106   CO2 25   ANIONGAP 9   CALCIUM  9.2   BUN 20   CREATININE 0.78   GLUCOSE 100     No results for input(s): LABALBU, TP, AST, ALKPHOS, BILITOT, BILIDIR, ALT, GLOB in the last 72 hours.    Invalid input(s): AGRAT    Lab Results   Component Value Date/Time    COLORU Yellow 04/15/2024 03:02 AM    CLARITYU Clear 04/15/2024 03:02 AM    GLUCOSEU Negative 04/15/2024 03:02 AM    BILIRUBINUR Negative 04/15/2024 03:02 AM    KETUA Negative 04/15/2024 03:02 AM    BLOODU Trace-lysed 04/15/2024 03:02 AM    PHUR 5.5 04/15/2024 03:02 AM    NITRU Negative 04/15/2024 03:02 AM    LEUKOCYTESUR Trace 04/15/2024 03:02 AM     No results for input(s): POCGLU in the last 72 hours.          Genell Radford Md  [facility]

## 2024-04-19 NOTE — Plan of Care (Signed)
 Problem: Respiratory - Adult  Goal: Achieves optimal ventilation and oxygenation  Outcome: Progressing     Problem: Pain  Goal: Verbalizes/displays adequate comfort level or baseline comfort level  Outcome: Progressing     Problem: Safety - Adult  Goal: Free from fall injury  Outcome: Progressing     Problem: Skin/Tissue Integrity  Goal: Skin integrity remains intact  Description: 1.  Monitor for areas of redness and/or skin breakdown  2.  Assess vascular access sites hourly  3.  Every 4-6 hours minimum:  Change oxygen saturation probe site  4.  Every 4-6 hours:  If on nasal continuous positive airway pressure, respiratory therapy assess nares and determine need for appliance change or resting period  Outcome: Progressing

## 2024-04-19 NOTE — Progress Notes (Signed)
 PHYSICAL THERAPY    Time  PT Charge Capture  Rehab Caseload Tracker       Patient: Susan [name] (73 y.o. female)  Room: 5212/5212    Primary Diagnosis: Foot abscess [L02.619]  Right leg pain [M79.604]  Abscess of right foot [L02.611]   Procedure(s) (LRB):  RIGHT HEEL IRRIGATION AND DEBRIDEMENT WITH WOUND VAC APPLICATION (Right) 2 Days Post-Op  Date of Admission: 04/14/2024   Insurance: Payor: BCBS MEDICARE / Plan: ANTHEM FULL DUAL ADVANTAGE 2 / Product Type: *No Product type* /      Date: 04/19/2024  Time: 959    OBJECTIVE:     Orders, labs, and chart reviewed on Susan [name]. Discussed with patient's nurse (patient ok to be seen by PT).    HPI: 73 yo F admit 04/14/24 2/2 progressive B LE pain. S/p I&D R heel abscess with wound vac placement 7/17.  PMHx: COPD on home O2, HTN, peripheral neuropathy    Patient was not seen for skilled physical therapy evaluation, secondary to patient declined, stating How am I supposed to do PT if I can't use my legs? Educated pt on NWB status for R LE but as of now, no weight bearing restrictions on L LE and benefits of participation for prevention of further deconditioning or secondary complications (ie DVT/PE, PNA, bed sores etc). Pt receptive to education but asked PT to return tomorrow.    PLAN:     Our services will follow up as patient's condition and/or schedule permit.    KATHRYN R CRAWLEY, PT DPT  April 19, 2024

## 2024-04-19 NOTE — Progress Notes (Signed)
 Remote note.  Chart reviewed. Notes, labs and imaging reviewed as needed.    Complicated 73 year old female with significant comorbidities coming with bilateral feet swelling, right worse than left     Tmax 99.5, other VSS with 100% sat on 2 L nasal cannula  Labs include creatinine 0.78 BS 100 WBC 12.7, slightly lower Hgb 7.9 PLT 351  Micro includes 7/17 right foot OR culture Gram stain negative, NGTD, ANA IP x 2, fungal IP, 7/15 urine culture <10K skin and genital contamination, wound culture NG, 7/14 blood culture x 2 NTD  Imaging includes 7/17 CT left foot cellulitis of lateral midfoot and forefoot with more focal 3.8 cm area of fluid at the level of midfoot suspicious for early developing abscess.  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 Rosamond seen, plan for CTA today, possible angio next week noted      Bilateral leg edema  - Recent PVL 7/8 was negative for DVT but low threshold to repeat     Leukocytosis likely reactive and monitor  - Improved     AKI?  CKD  - Avoid nephrotoxic medications and dose for current creatinine clearance  - Renal ultrasound without any stone or obstructionOther details as above     Other details as above     Rec:  -Continue IV Vanco, cefepime  and Flagyl  for now  - Await OR culture results on right foot from 7/17  -Local wound care/wound VAC per podiatry  -Await further input from podiatry regarding left foot abscess  - Appreciate vascular plans for CTA, possible angio  - Keep legs elevated on pillows  - Monitor serial labs  - Monitor for any ADE  - Remains at risk for worsening      Please call me if you have any question or concern.We will check back on Monday.  Thank you.    Dragon medical dictation software was used for portions of this report. Unintended errors may occur.     JINNY Julieanne Meadows, MD  April 19, 2024    [facility]   Office [phone]  [phone]

## 2024-04-19 NOTE — Progress Notes (Signed)
 PHYSICAL THERAPY EVALUATION     Acknowledge Orders  Time  PT Charge Capture  Rehab Caseload Tracker  [place] AM-PAC 6 Clicks Basic Mobility Inpatient Short Form  -    Patient: Susan [name] (73 y.o. female)  Room: 5212/5212    Primary Diagnosis: Foot abscess [L02.619]  Right leg pain [M79.604]  Abscess of right foot [L02.611]   Procedure(s) (LRB):  RIGHT HEEL IRRIGATION AND DEBRIDEMENT WITH WOUND VAC APPLICATION (Right) 2 Days Post-Op  Date of Admission: 04/14/2024   Length of Stay:  4 day(s)  Insurance: Payor: BC[employer] MEDICARE / Plan: [employer] FULL DUAL ADVANTAGE 2 / Product Type: *No Product type* /      Date: 04/19/2024  Time In: 1447       Time Out: 1510   Total Minutes: 23  Treatment Time: 8 minutes    Isolation:  No active isolations       MDRO: No active infections  Current diet order: ADULT DIET; Regular; 4 carb choices (60 gm/meal)  ADULT ORAL NUTRITION SUPPLEMENT; Dinner, Lunch; Standard High Calorie/High Protein Oral Supplement    Precautions: falls, pressure injury risk   Ordered weight bearing status: no weight bearing recs in chart, assume NWB R LE s/p I&D to heel; pt believes she should be NWB to B LE    ASSESSMENT:    HPI: 73 yo F admit 04/14/24 2/2 progressive B LE pain. S/p I&D R heel abscess with wound vac placement 7/17.  PMHx: COPD on home O2, HTN, peripheral neuropathy    Pt seen at bedside for PT eval, agreeable to minimal bed level therex only and only after max encouragement from PT, RN and family at bedside as well as premedication for pain. Pt has been bed bound since mid June 2025 due to the edema and pain in her B LE. She states she wants to participate and get better but also exhibits self limiting behaviors. She allowed x3 reps of gentle active assisted ROM of L LE only; refused movement of R LE or transition to EOB this date. Plan to attempt tomorrow for EOB/upright activities. Pt presents with severe B LE pain (R worse than L) global weakness, decreased activity tolerance  and decreased independence with functional mobility. Will continue to follow for progression of mobility as pt tolerates.    Patient's rehabilitation potential for below stated goals: fair    Recommendations:  Recommend continued physical therapy during acute stay. Physical Therapy and Occupational Therapy.     Discharge Recommendations: If patient decides to go home despite PT recommendations for SNF, recommend home health PT and family/caregiver support to increase overall strength, activity tolerance, standing balance/tolerance, functional mobility, safety awareness and overall self care independence, Skilled nursing facility (SNF): patient will benefit from further therapy at rehab facility to increase strength and endurance to return to prior level of function.    Further Equipment Recommendations for Discharge: DME to be determined at time of discharge from SNF.     Functional Outcome Measure:    AM-PAC: AM-PAC Inpatient Mobility Raw Score : 8  -  SNF:  Current research shows that an AM-PAC score of 17 or less is not associated with a discharge to the patient's home setting.  Based on an AM-PAC score and their current ADL deficits; it is recommended that the patient have 3-5 sessions per week of Physical Therapy at d/c to increase the patient's independence.     This AMPAC score should be considered in conjunction with interdisciplinary team recommendations  to determine the most appropriate discharge setting. Patient's social support, diagnosis, medical stability, and prior level of function should also be taken into consideration.      PRIOR LEVEL OF FUNCTION / HOME ENVIRONMENT:      Information was obtained by patient    Home environment:  lives with her daughter and son in law in a 2 story home, 1st floor bedroom, 2 steps to enter  Prior level of function: progressive decline in mobility with last ambulation in June of 2025. Has been essentially bedbound at home 2/2 B LE pain  Home equipment: rollator, WC  (broken?)    PLAN:      Patient will benefit from skilled Physical Therapy intervention to address the above impairments to return to prior level of function. PT Plan of Care: 3 times/week, 5 times/week.    Patient will achieve PT goals in 1 week.     PHYSICAL THERAPY GOALS:     - Patient will be mod A with rolling in preparation for EOB activities.   - Patient will be mod A with supine<>sit to prepare for OOB activity.  - Patient will be max A with sit<>stand in preparation for OOB activities and ambulation.  - Patient will be max A with bed<>chair/WC/BSC in preparation for OOB activities and ambulation.  - Patient will be max A for ambulating 5+ feet with the least restrictive device to promote functional independence at home.   - Patient will demonstrate fair+ sitting balance to safely enable upright activities and reduce risk for falls.    PLANNED INTERVENTIONS:      Skilled Physical Therapy services will provide functional mobility training, therapeutic exercises, therapeutic activities, patient/caregiver education as indicated.  Skilled Physical Therapy services will modify and progress therapeutic interventions, address functional mobility deficits, address ROM and strength deficits, analyze and cue movement patterns and assess and modify postural abnormalities to reach the stated goals.    COMMUNICATION/EDUCATION:     Education: Ill effects of bedrest, benefits of activity, call staff for assistance, safety, HEP, positioning, precautions, role of PT, PT plan of care.    Education provided to: patient  Opportunity for questions and clarification was provided.    Readiness to learn indicated by: verbalized understanding    Barriers to learning/limitations: none    Comprehension: Patient communicated comprehension      SUBJECTIVE:      Patient agreeable to gentle L LE ROM  Patient reports unrated/10 pain before treatment and unrated/10 pain at conclusion of treatment.  Pain Location: B LE    OBJECTIVE DATA  SUMMARY:      Orders, labs, and chart reviewed on Susan [name]. Communicated with patient's nurse (patient ok to be seen by PT).     Present illness history:   Patient Active Problem List    Diagnosis Date Noted    Foot abscess 04/15/2024    OSA (obstructive sleep apnea)     Dyslipidemia     Asthma     Psychiatric disorder     Chronic obstructive pulmonary disease (HCC)     Bipolar 1 disorder (HCC)     Peripheral neuropathy 06/19/2013    Hypertension 06/19/2013      Previous medical history: History reviewed. No pertinent past medical history.     PATIENT FOUND:     Semi reclined in bed. (+) bed alarm. (+) family present.    COGNITIVE STATUS:     Mental Status:  Oriented x3   Communication:  normal  Follows commands:  follows one step commands/direction   General cognition:  grossly intact   Safety/Judgement:  appropriate awareness of environment and need for assistance       EXTREMITIES ASSESSMENT:      Range Of Motion:  RLE: ROM is limited by pain; pt did not allow assessment this date   LLE: ROM is WFL    Strength:    RLE: Strength is grossly graded as did not allow assessment  LLE: Strength is grossly graded as 2/5    Sensation:  premorbid    Tone  normal      THERAPEUTIC ACTIVITIES; FUNCTIONAL MOBILITY AND BALANCE STATUS:     Patient received/participated in Therapeutic Exercises (8 minutes)    Pt declined mobility assessment 2/2 pain.     THERAPEUTIC EXERCISES:      3 reps each, L LE, gentle AAROM:  ankle pumps, heel slides, hip ab/duction    ACTIVITY TOLERANCE:     - activity is limited by pain    FINAL LOCATION:     Positioned in bed, all needs within reach. Patient agrees to call for assistance. As found. (+) bed alarm. Nurse notified. (+) family present.    Thank you for this referral.  KATHRYN R CRAWLEY, PT  DPT

## 2024-04-20 LAB — BASIC METABOLIC PANEL
Anion Gap: 7 mmol/L (ref 5–15)
BUN: 19 mg/dL (ref 9–23)
CO2: 27 meq/L (ref 20–31)
Calcium: 9.1 mg/dL (ref 8.7–10.4)
Chloride: 106 meq/L (ref 98–107)
Creatinine: 0.76 mg/dL (ref 0.55–1.02)
GFR African American: >60
GFR Non-African American: >60
Glucose: 110 mg/dL — ABNORMAL HIGH (ref 74–106)
Potassium: 4.7 meq/L (ref 3.5–5.1)
Sodium: 140 meq/L (ref 136–145)

## 2024-04-20 LAB — CULTURE, BLOOD 2: BLOOD CULTURE RESULT: NO GROWTH

## 2024-04-20 LAB — CBC WITH AUTO DIFFERENTIAL
Basophils: 0.3 % (ref 0–3)
Eosinophils: 1.5 % (ref 0–5)
Hematocrit: 25.2 % — ABNORMAL LOW (ref 35.0–47.0)
Hemoglobin: 7.7 g/dL — ABNORMAL LOW (ref 11.0–16.0)
Immature Granulocytes %: 0.4 % (ref 0.0–3.0)
Lymphocytes: 8.2 % — ABNORMAL LOW (ref 28–48)
MCH: 29.6 pg (ref 25.4–34.6)
MCHC: 30.6 g/dL (ref 30.0–36.0)
MCV: 96.9 fL (ref 80.0–98.0)
MPV: 9.8 fL (ref 6.0–10.0)
Monocytes: 7.9 % (ref 1–13)
Neutrophils Segmented: 81.7 % — ABNORMAL HIGH (ref 34–64)
Nucleated RBCs: 0 (ref 0–0)
Platelets: 358 1000/mm3 (ref 140–450)
RBC: 2.6 M/uL — ABNORMAL LOW (ref 3.60–5.20)
RDW: 54.3 — ABNORMAL HIGH (ref 36.4–46.3)
WBC: 12.9 1000/mm3 — ABNORMAL HIGH (ref 4.0–11.0)

## 2024-04-20 LAB — CULTURE, WOUND W GRAM STAIN: Culture Result: NO GROWTH

## 2024-04-20 LAB — CULTURE, SURGICAL WOUND W GRAM STAIN: Culture Result: NO GROWTH

## 2024-04-20 LAB — CULTURE, BLOOD 1: BLOOD CULTURE RESULT: NO GROWTH

## 2024-04-20 LAB — MAGNESIUM: Magnesium: 1.6 mg/dL (ref 1.6–2.6)

## 2024-04-20 LAB — VANCOMYCIN LEVEL, TROUGH: Vancomycin Tr: 19.3 ug/mL — ABNORMAL HIGH (ref 5.0–10.0)

## 2024-04-20 MED ORDER — VANCOMYCIN INTERMITTENT DOSING (PLACEHOLDER)
Freq: Once | INTRAVENOUS | Status: AC
Start: 2024-04-20 — End: 2024-04-22
  Administered 2024-04-23: 01:00:00 1

## 2024-04-20 MED FILL — METRONIDAZOLE 500 MG/100ML IV SOLN: 500 MG/100ML | INTRAVENOUS | Qty: 100 | Fill #0

## 2024-04-20 MED FILL — OXYCODONE-ACETAMINOPHEN 7.5-325 MG PO TABS: 7.5-325 mg | ORAL | Qty: 1 | Fill #0

## 2024-04-20 MED FILL — HEPARIN SODIUM (PORCINE) 5000 UNIT/ML IJ SOLN: 5000 [IU]/mL | INTRAMUSCULAR | Qty: 1 | Fill #0

## 2024-04-20 MED FILL — CEFEPIME HCL 1 G IJ SOLR: 1 g | INTRAMUSCULAR | Qty: 1000 | Fill #0

## 2024-04-20 MED FILL — MORPHINE SULFATE 2 MG/ML IJ SOLN: 2 mg/mL | INTRAMUSCULAR | Qty: 1 | Fill #0

## 2024-04-20 MED FILL — VANCOMYCIN (VANCOCIN) 1000 MG IN SODIUM CHLORIDE 0.9% 250 ML IVPB: Qty: 250 | Fill #0

## 2024-04-20 NOTE — Progress Notes (Signed)
 Seen at bedside, denies complaints    Vitals:    04/20/24 0755   BP: (!) 137/57   Pulse: 88   Resp: 18   Temp:    SpO2: 94%           [place]     Right foot vac @ 125   Msk/motor unchanged    A/p   - cont w/ negative pressure   = wound vac changes to be done q3   - will assess for further debridement and grafting pending vascular plans.       [place]

## 2024-04-20 NOTE — Progress Notes (Signed)
 INFECTIOUS DISEASE FOLLOW UP NOTE     Date of admission: 04/14/2024     Date of consult: April 15, 2024        ABX:      Current abx Prior abx    Cefepime /Vanco 7/14-6  Flagyl  7/15-5        ASSESSMENT:       Right foot abscess  -Unclear cause of swelling and pain that has been present for several months  -Has been evaluated by orthopedic  -Patient seen by [facility] specialist Dr. Curtistine Emperor with last visit 03/11/2024 for right contusion of dorsum of foot, lower extremity edema and right foot pronation deformity  -03/08/2024 MRI right hindfoot no fracture.  Mild multifocal osseous edema?  Altered gait.  And mechanical stress-related changes.  Mild intrinsic foot muscular atrophic and intramuscular edema.  Subcu edema along the dorsum of the foot  -xray no fracture  -S/p I&D by ED pending cultures   - Patient seen by podiatrist Dr. Roberts   - Awaiting CT foot    Left foot swelling  -Xray no fracture  -Low threshold for   Leucocytosis POA   AKI/CKD  -Abnormal creatinine few weeks ago on routine blood work at [employer]   PAD  -Patient has been following with vascular surgeon and last seen in office on 04/08/2024  -12/2023 ABI at [facility] with severe left lower extremity arterial insufficiency at rest and right first digital disease with known proximal arterial insufficiency  -12/2023 s/p right lower extremity angiogram showed single-vessel peroneal runoff with good branches   Ch hypoxic resp failure   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  -Has been following with neurosurgery at [facility] artery aneurysm  -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 and following with [facility] neurosurgery with plan for continued management   Ax- PCN -as a child   Co-morb- COPD, schizoaffective disorder, HTN,  depression,             RECOMMENDATIONS:      Complicated 73 year old female with significant comorbidities coming with bilateral feet swelling, right worse than left     Tmax 99, other VSS, 97% RA O2 sat  Labs today include creatinine 0.76 BS 110 WBC little change 12.9 Hgb 7.7 slightly lower, PLT 358  Micro includes 7/17 right foot OR culture Gram stain negative, NGTD, ANA IP x 2, 7/15 urine culture <10K skin and genital contamination, wound culture NG, 7/14 blood culture x 2 NG  Imaging includes 7/19 CTA abdomen with runoff moderate mid right SFA and patent stent patent right pop with single-vessel runoff, diffuse moderate disease left SFA moderate stenosis left posterior tibial artery; open wound right foot SQ gas multiple abscesses midfoot dorsal subcu also poorly defined fluid collections left foot and subcu anterior left knee.  7/17 CT left foot cellulitis of lateral midfoot and forefoot with more focal 3.8 cm area of fluid at the level of midfoot suspicious for early developing abscess.  No evidence of OM, 7/15 retroperitoneal ultrasound unremarkable except partially exophytic simple appearing interpolar left renal cyst, 7/15 CT right foot subcu phlegmon/abscess lateral right hindfoot and 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 of the subjacent lateral margin of the calcaneus  Vascular study 7/16 moderate right iliac-femoral, severe left arterial insufficiency at femoropopliteal region     Assessment     Right foot abscess  - Coming with swelling, pain of right foot   - Understand I&D in ED by  provider and cultures negative so far  - CT concerning for abscess   - 7/17 s/p OR with I&D and placement of wound VAC   - Or cultures in progress, GS negative  - Continue with local wound care, limb elevation and broad-spectrum antibiotic for now     Left foot swelling  - Tenderness on dorsal aspect  -CT suggestive of cellulitis with focal area of collection?  Abscess and CTA 7/19 poorly defined collections foot and subcu left  knee-exam of left knee today no obvious ballotable fluid or abscess, however  - Monitor and await further recommendation from podiatry     PAD  -Previous history of angio but recent ABI with monophasic waveforms in femoral and plan for angio per vascular  -Vascular study 7/16 moderate right iliac-femoral, severe left arterial insufficiency at femoropopliteal region  - Complains of increasing BLE pain today question from this versus other, denies history of gout  - Vascular, Dr. Rosamond seen, CTA with bilateral disease as above     Bilateral leg edema  - Recent PVL 7/8 was negative for DVT but low threshold to repeat     Leukocytosis likely reactive and monitor  - Improved     AKI?  CKD  - Avoid nephrotoxic medications and dose for current creatinine clearance  - Renal ultrasound without any stone or obstructionOther details as above     Other details as above     Rec:  -Continue IV Vanco, cefepime  and Flagyl  for now  - Await OR culture results on right foot from 7/17, NGTD  -Local wound care/wound VAC per podiatry right foot  -Await further input from podiatry regarding left foot abscess  - Continue to monitor left knee with question left knee fluid collection on CTA  - Appreciate vascular evaluations await decision regarding possible angio  - Keep legs elevated on pillows  - Monitor serial labs  - Monitor for any ADE  - Remains at risk for worsening                  MICROBIOLOGY:      7/14 Blcx x2 IP              Wd cx IP  7/15 Ucx IP  7/17 anaerobic x 2 IP wound NOS, NGTD surgical wound NGTD right heel     LINES AND CATHETERS:      PIV     SUMMARY:      Susan [name] is a 73 year old African-American female with past medical history of COPD, schizoaffective disorder, hypertension, depression, PAD, peripheral neuropathy was admitted on 7/14 with worsening leg pain.     Patient is not a good historian and says lives with her daughter.  She has been almost bedbound for quite some time. Pt has been following with [facility] for  basilar artery aneurysm and conservative Mx.  Patient also developed right leg swelling for which she was referred to Ortho and eventually podiatry.  It was not thought to be the part of her back leading to right leg swelling and neuropathy.  Patient also following with vascular and had procedure done in March with good runoff and did not think was concern for pain and swelling.  Patient last seen by podiatry 03/11/2024 and plan for conservative management.      She was seen in ED on 8 July with leg swelling.  Workup showed negative PVL for DVT and chest x-ray without any significant finding.  She was noted with pitting edema  and was given some Lasix  and asked to follow-up with PCP for adjustment in medications.  Patient returned 7/14 with worsening swelling of the legs, right worse than left.  Workup showed stable vitals, afebrile state.  WBC elevated at 19, Hb 9.8, PLT 354, BUN 34, CR 1.43, AST 17, ALT 14.  Blood cultures were obtained.  Patient had bilateral feet x-ray which showed no fracture or concern for osteo.  Chest x-ray was suggestive of left basilar opacity with developing pneumonia versus atelectasis.  Patient had I&D in the ED.  Podiatry was consulted and CT was ordered.  Wound culture in progress.  Patient on broad-spectrum antibiotic with cefepime  and Vanco.  ID was asked for further evaluation management.       SUBJECTIVE :     Interval notes reviewed.  Seen by vascular, CTA aorta and runoff with moderate right SFA single-vessel runoff diffuse disease left SFA with open wound right foot with suspected abscesses also left foot and left knee subcu reported.  Patient reports persistent pain both legs not better, denies history of gout, denies ADR nausea vomiting or diarrhea  OBJECTIVE     BP 139/69   Pulse 82   Temp 98.4 F (36.9 C) (Temporal)   Resp 18   Ht 1.702 m (5' 7)   Wt 81.6 kg (179 lb 14.3 oz)   SpO2 94%   BMI 28.18 kg/m     Temp (24hrs), Avg:97.9 F (36.6 C), Min:96.6 F (35.9 C),  Max:98.4 F (36.9 C)    General: Fairly developed and nourished 73 y.o. year-old, female, uncomfortable appearing  HEENT: Normocephalic, anicteric sclerae, Pupils equal, round reactive to light, no oropharyngeal lesions. No sinus tenderness.  Neck: Supple, no lymphadenopathy, masses or thyromegaly  Chest: Symmetrical expansion  Lungs: Clear to auscultation bilaterally, no dullness  Heart: Regular rhythm, no murmur, no rub or gallop, No JVD  Abdomen: Soft, non-tender,non distended, no organomegaly, BS+  Musculoskeletal: Limited exam of feet given pain.  Right leg in postop dressing and wound VAC.  Left foot remains swollen, tender mainly on dorsal aspect, left knee no obvious subcutaneous abscess or ballotable fluid  CNS: AAOx3.  Follows simple commands.  SKIN: No obvious   skin lesion or rash. Dry, warm, intact         MEDICATIONS:     Current Facility-Administered Medications   Medication Dose Route Frequency Provider Last Rate Last Admin    albuterol  (PROVENTIL ) (2.5 MG/3ML) 0.083% nebulizer solution 2.5 mg  2.5 mg Nebulization Q4H PRN Rosslyn Scrape, MD        [START ON 04/22/2024] Vancomycin  Trough Due  1 each Other Once Sahibzada, Ayaz, MD        ipratropium 0.5 mg-albuterol  2.5 mg (DUONEB ) nebulizer solution 1 Dose  1 Dose Inhalation Q4H PRN Sahibzada, Ayaz, MD        vancomycin  (VANCOCIN ) 1000 mg in sodium chloride  0.9% 250 mL IVPB  1,000 mg IntraVENous Q24H Matriano, John Paul H, MD   Stopped at 04/20/24 0112    senna (SENOKOT) tablet 8.6 mg  1 tablet Oral Daily PRN Matriano, Norleen Deward DEL, MD        docusate sodium  (COLACE) capsule 100 mg  100 mg Oral BID PRN Matriano, Norleen Deward DEL, MD        ondansetron  (ZOFRAN ) injection 4 mg  4 mg IntraVENous Q6H PRN Matriano, Norleen Deward DEL, MD        acetaminophen  (TYLENOL ) tablet 650 mg  650 mg Oral Q6H PRN Matriano,  Norleen Deward DEL, MD   650 mg at 04/18/24 1054    melatonin tablet 3 mg  3 mg Oral Nightly PRN Murrell Norleen Deward DEL, MD        naloxone  (NARCAN ) injection 0.4 mg   0.4 mg IntraVENous PRN Murrell Norleen Deward DEL, MD        glucagon  injection 1 mg  1 mg IntraMUSCular PRN Murrell Norleen Deward DEL, MD        dextrose  50 % IV solution  20-30 mL IntraVENous PRN Murrell, Norleen Deward DEL, MD        heparin  (porcine) injection 5,000 Units  5,000 Units SubCUTAneous BID Sahibzada, Ayaz, MD   5,000 Units at 04/20/24 9251    benzonatate  (TESSALON ) capsule 100 mg  100 mg Oral TID PRN Murrell Norleen Deward DEL, MD        cefepime  (MAXIPIME ) 1,000 mg in sodium chloride  0.9 % 50 mL IVPB (mini-bag)  1,000 mg IntraVENous Q12H Murrell Norleen Deward DEL, MD 12.5 mL/hr at 04/20/24 1157 1,000 mg at 04/20/24 1157    Vancomycin  Trough Due  1 each Other Once Murrell Norleen Deward DEL, MD        *Pharmacy to Dose Vancomycin   1 each Other RX Placeholder Matriano, Norleen Deward DEL, MD        morphine  (PF) injection 1 mg  1 mg IntraVENous Q3H PRN Murrell Norleen Deward DEL, MD   1 mg at 04/19/24 2007    sodium hypochlorite (DAKINS) 0.125 % external solution   Irrigation Daily Anastasio Cain A, DPM        metroNIDAZOLE  (FLAGYL ) 500 mg in 0.9% NaCl 100 mL IVPB premix  500 mg IntraVENous Q8H Meade Viviane RAMAN, MD   Stopped at 04/20/24 0900    oxyCODONE -acetaminophen  (PERCOCET ) 7.5-325 MG per tablet 1 tablet  1 tablet Oral Q4H PRN Josepha Planas, DO   1 tablet at 04/20/24 1117         Labs: Results:   Chemistry Recent Labs     04/18/24  0558 04/19/24  0523 04/20/24  0438   NA 139 140 140   K 5.1 4.7 4.7   CL 104 106 106   CO2 26 25 27    BUN 23 20 19       CBC w/Diff Recent Labs     04/18/24  0558 04/19/24  0523 04/20/24  0438   WBC 13.5* 12.7* 12.9*   RBC 3.02* 2.75* 2.60*   HGB 8.8* 7.9* 7.7*   HCT 30.1* 27.1* 25.2*   PLT 365 351 358          RADIOLOGY :        Imaging  CTA ABDOMINAL AORTA W BILAT RUNOFF W WO CONTRAST  Result Date: 04/19/2024  HISTORY: peripheral artery disease Technique: Noncontrast CT imaging is acquired through the chest for bolus tracking purposes. Enhanced CT imaging through the abdomen, pelvis, and lower extremities is  performed during arterial phase of IV contrast menstruation as part of a CTA runoff. Angiographic reconstructions are performed formed at a separate workstation. The patient received an uneventful IV bolus of 100 mL of Omnipaque 350 nonionic contrast as part of the study. COMPARISON STUDY: none FINDINGS: Vascular structures and lower extremities: Abdominal aorta is normal in caliber and enhancement. Celiac, SMA, and renal arteries are moderately calcified yet patent with no high-grade stenosis. IMA is patent. Mild narrowing of the left common iliac artery. The right common iliac and external iliac arteries are patent. Hypogastric arteries are patent. On  the right, common femoral and profunda are patent. Diffuse mild disease of the right SFA with moderate grade narrowing in the mid SFA just proximal to the patent stent. The right popliteal artery is patent. Single-vessel runoff to the right foot through the peroneal artery. There is gradual diminished enhancement in the anterior and posterior tibial arteries. The ankle. Ulcerations are present in the right foot with subcutaneous gas and a vacuum. 2 cm abscess in the plantar subcutaneous tissues with an air-fluid level noted. Additional 2.4 cm abscess in the dorsal subcutaneous tissues of the mid foot noted with an air-fluid level. On the left, common femoral artery is mildly narrowed secondary to plaque and the profunda is patent. Diffuse moderate disease of the left SFA with partially calcified plaque. Popliteal artery is patent. Moderate grade stenosis at the proximal posterior tibial artery. Two-vessel runoff via the posterior tibial and peroneal arteries. Diminished enhancement noted in the distal anterior tibial artery. Along the lateral aspect of the midfoot there is a 2.5 x 4.6 cm area of low density which may represent a developing abscess or phlegmon. There is also a region of edema in the anterior left lower leg at the level of the ankle. At the level of  the knee, there is a irregular shaped fluid collection in the anterolateral knee just lateral to the tibial tuberosity which measures approximately 4.2 cm in diameter. Bibasilar subsegmental atelectasis. Heart size is borderline enlarged. The liver has a slight nodular contour. The gallbladder is present and contains excreted contrast. The spleen, pancreas, and adrenal glands are within normal notes. 2.6 cm left renal cyst. Kidneys are otherwise normal in morphology with no hydronephrosis. Appendix and small bowel loops are unremarkable. Diverticulosis is noted throughout the colon. No free intraperitoneal fluid or air. Uterus contains a coarsely calcified leiomyoma. No adnexal or pelvic mass. Urinary bladder is unremarkable. Osseous structures the pelvis and lumbar spine are intact. No compression deformities.     IMPRESSION: 1. No aortic inflow stenosis. 2. Moderate grade stenosis of the mid right SFA and patent SFA stent. Patent right popliteal artery with single vessel runoff to the right foot. 3. Diffuse moderate disease of the left SFA with patent popliteal artery and diseased two-vessel runoff to the left foot. Moderate grade stenosis in the proximal left posterior tibial artery. 4. Open wound in the right foot with subcutaneous gas and multiple abscesses in the mid foot along the plantar surface and dorsal subcutaneous tissues. 5. Poorly defined fluid collections in the left foot and in the subcutaneous tissues of the anterior left knee which are concerning for additional abscesses. Electronically signed by: Freda Lanes, MD 04/19/2024 2:45 PM EDT          Workstation ID: [id number]     CT FOOT LEFT W CONTRAST  Result Date: 04/17/2024  HISTORY: Swelling and pain. TECHNIQUE: Helical acquisition CT images through the left foot was performed with contrast with multiplanar reformats. COMPARISON: None. FINDINGS: No visualized acute fracture or dislocation. No evidence of periosteal reaction or bony destruction  to suggest osteomyelitis. There is extensive subcutaneous fat edema and stranding within the lateral soft tissues from the level of the mid foot to the forefoot. In particular, there is a prominent area of focal fluid measuring approximately 3.8 x 2.1 cm on image 20, series 5 that extends superficially to abut the skin and may represent a developing abscess. There is no definite visualized enhancing wall.     IMPRESSION: 1. Findings are compatible with cellulitis of the  lateral midfoot and forefoot with more focal 3.8 cm area of fluid at the level of the midfoot that is suspicious for an early developing abscess as there is no discrete capsule. 2. No evidence of osteomyelitis. Electronically signed by: Lillia Hug, MD 04/17/2024 11:37 AM EDT          Workstation ID: [id number]     LE ARTERIAL  Result Date: 04/16/2024                                                                                                                                    Version: 1                                                                                                                                   Study ID: [id number]                                                       [facility]                                                       [address] [place], [place]  [address]                                                           Arterial Lower Extremity Report Name: Susan [name], Susan [name]  Study Date: 04/15/2024, 2: 74 PM MRN: [id number]                                                               Patient Location: [facility] Age: 73 Years                                                               DOB: Sep 09, 1951 (MM/DD/YYYY)                                                                               Gender: Female Performed By: Edilia Romelia Elden Sherrell, RVT                           Account #:: [id number] Ordering Physician: ANASTASIO CAIN A Reason For Study: Peripheral vascular disease.                                                     INTERPRETATION SUMMARY 1. Moderate right lower extremity arterial insufficiency at the level of the iliac-femoral region. 2. Severe left lower extremity arterial insufficiency at the level of the femoral-popliteal region. 3. Digital pressures deferred due to patient's pain. QUALITY/PROCEDURE Arterial Physio ABI [phone]). A bilateral lower extremity arterial exam was performed. Continuous wave Doppler tracings were recorded from the common femoral, popliteal, posterior tibial and dorsalis pedis arteries bilaterally, and resting ankle brachial pressures were measured. Digital pressures deferred due to patient's pain. RIGHT LEG SEGMENTAL Brachial = 131 mmHg. Ankle Pressure (PT) = 81 mmHg. Ankle Pressure (DP) = 83 mmHg. Right ABI = 0.63. The lower extremity ankle brachial index is 0.63 on the right. The right lower extremity waveforms are monophasic in the femoral, the popliteal, the posterior tibial and the dorsalis pedis arteries. RIGHT DIGIT PPG Digit 1 flow is absent. LEFT LEG SEGMENTAL Brachial = 126 mmHg. Ankle (PT) = 59 mmHg. Ankle (DP) = 63 mmHg. Left ABI = 0.48. The lower extremity ankle brachial index is 0.48 on the left. The left lower extremity waveforms are multiphasic in the femoral, and monophasic in the popliteal, the posterior tibial and the dorsalis pedis arteries. LEFT DIGIT PPG Digit 1 flow is absent. ______________________________________________________________________________ Electronically signed by: Dr Blenda Fear, M.D                       04/16/2024, 10: 01 PM     US  RETROPERITONEAL COMPLETE  Result Date: 04/15/2024  INDICATION: Acute renal failure. COMPARISON: None. TECHNIQUE: Transabdominal grayscale and duplex sonography of the retroperitoneum performed. FINDINGS: RIGHT KIDNEY:  10.7 cm in length. (Normal range  9-13 cm) Normal renal  parenchymal echogenicity. No obstruction, renal calculi or focal lesion. LEFT KIDNEY: 9.8 cm in length. (Normal range 9-13 cm) Normal renal parenchymal echogenicity.  And interpolar simple-appearing partially exophytic left renal cyst measures 2.6 x 2.6 x 2.1 cm. URINARY BLADDER: Bladder has a pre-void volume of 240.0 mL. (<50 mL WNL) Patient did not void for the exam. Neither ureteral jet(s) identified. No urinary bladder wall thickening or debris within the urinary bladder. (Normal wall thickness <3 mm distended, <5 mm underdistended.) Bladder contains a Foley catheter and is decompressed. PANCREAS:  Visualized pancreas is within normal limits. IVC: Patent. ABDOMINAL AORTA: Normal caliber abdominal aorta. COMMON ILIAC ARTERIES: Suboptimally visualized secondary to interposed bowel gas.     IMPRESSION: 1.  No hydronephrosis. Kidneys unremarkable sonographically with the exception of a partially exophytic simple-appearing interpolar left renal cyst. 2.  Urinary bladder unremarkable sonographically. 3.  Common iliac arteries above the visualized sonographically due to interposed bowel gas Electronically signed by: Alm Conte, MD 04/15/2024 8:34 PM EDT          Workstation ID: [id number]     CT FOOT RIGHT W CONTRAST  Result Date: 04/15/2024  Indication: Right foot infection. Evaluate for abscess. Comparison: None available Technique: Computed tomographic imaging of the right foot was performed following IV contrast administration. Coronal and sagittal reconstructions were created from the initial axial data set and submitted for interpretation. Findings: Complex gas and fluid containing abscess//phlegmon in the subcutaneous lateral hindfoot spanning approximately 4.4 cm craniocaudal by 2 x 1.5 cm transverse. This is positioned posterior to the peroneal tendons which are intact and not overtly involved. There is subtle loss of bony cortex/rarefaction at the lateral margin of the subjacent calcaneus concerning for  osteomyelitis.     IMPRESSION: 1. Subcutaneous phlegmon/abscess lateral right hindfoot and 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 of the subjacent lateral margin of the calcaneus. Electronically signed by: Glade Reeve, MD 04/15/2024 2:16 PM EDT          Workstation ID: [id number]     XR FOOT RIGHT (2 VIEWS)  Result Date: 04/15/2024  Exam: 2 views of the right foot. Clinical indications:  PAIN. RULE OUT OSTEOMYELITIS. Right foot pain and swelling. Comparison:   04/15/2024;  Findings:  No fracture.  No dislocation. No radiopaque foreign bodies. No bone erosions.     Impression:  No fracture. No bone erosions to suggest osteomyelitis. Electronically signed by: Celine Rear, MD 04/15/2024 12:52 AM EDT          Workstation ID: [id number]     XR FOOT LEFT (2 VIEWS)  Result Date: 04/15/2024  Exam: 2 views of the left foot. Clinical indications:  PAIN right foot. Swelling. Possible osteomyelitis. Comparison:  None Findings:  No fracture.  No dislocation. No bone erosions. No radiopaque foreign bodies.     Impression:  No fracture. Electronically signed by: Celine Rear, MD 04/15/2024 12:51 AM EDT          Workstation ID: [id number]     XR CHEST PORTABLE  Result Date: 04/15/2024  Exam: AP portable chest Clinical indication: SEPSIS Comparison: 04/08/2024;  Results:  Left basilar opacity. No pneumothorax. Heart normal.  Mediastinal contours are normal. No free air is seen under the hemidiaphragms.   Osseous structures intact.     IMPRESSION: Left basilar opacity. Developing pneumonia versus atelectasis. Electronically signed by: Celine Rear, MD 04/15/2024 12:41 AM EDT          Workstation ID: [id number]     VL DUP LOWER EXTREMITY VENOUS  BILATERAL  Result Date: 04/09/2024                                                                                                                                    Version: 1                                                                                                                                    Study ID: [id number]                                                       [facility]                                                       [address] [place], [place]  [address]                                                      Lower Extremity Venous Duplex Report Name: JAMIAH, HOMEYER Date: 04/08/2024, 2: 2 PM MRN: [id number]  Patient Location: ER^[facility]^WT^[facility] Age: 6 Years                                                               DOB: [birth year] (MM/DD/YYYY)                                                                               Gender: Female Performed By: Kylee R. Holloman, RVT                                       Account #:: [id number] Ordering Physician: [facility], ADIT B Reason For Study: Lower extremity pain and swelling                                                     INTERPRETATION SUMMARY 1. No evidence of deep vein thrombosis in the bilateral lower extremities. QUALITY/PROCEDURE Complete bilateral venous duplex performed. Quality of the study is good. Location: ER. RIGHT LEG The deep venous system of the right lower extremity was examined using duplex ultrasound. B-mode imaging demonstrated normal compressibility of the right common femoral, femoral, popliteal, posterior tibial and peroneal veins. Doppler flow signals were spontaneous and phasic at all levels. RIGHT SAPHENOUS VEINS Right great saphenous vein at the saphenofemoral junction demonstrates normal compressibility with spontaneous and phasic venous hemodynamics. LEFT LEG The deep venous system of the left lower extremity was examined using duplex ultrasound. B-mode imaging demonstrated normal compressibility of the left common femoral, femoral, popliteal, posterior tibial and peroneal veins. Doppler flow signals were  spontaneous and phasic at all levels. LEFT LEG SAPHENOUS VEINS Left great saphenous vein at the saphenofemoral junction demonstrates normal compressibility with spontaneous and phasic venous hemodynamics. ______________________________________________________________________________ Electronically signed by: Dr Blenda Fear, M.D                       04/09/2024, 8: 57 AM     XR CHEST (2 VW)  Result Date: 04/08/2024  History: Shortness of breath Comparison: None Technique: PA and lateral radiographs of the chest were obtained. Two views, 2 exposures total. Findings: The lungs and pleural spaces are clear. Normal cardiomediastinal silhouette. Degenerative changes of the thoracic spine.     IMPRESSION: No acute cardiopulmonary disease. Electronically signed by: Norleen Gleason 04/08/2024 11:20 AM EDT          Workstation ID: [id number]     ECHO CARDIOGRAM COMPLETE-IN HOUSE  Result Date: 03/21/2024  CONCLUSIONS   * Left ventricular systolic function is normal with an ejection fraction of 66 % by Simpson's biplane.   * Left ventricular chamber size is normal.   * Left ventricular diastolic function: indeterminate.   * Right ventricular systolic function is normal.   * Right ventricular chamber dimension  is normal.   * There is mild aortic valve stenosis with a peak velocity of 2.8 m/s, mean gradient of 15 mmHg, and aortic valve area of 1.59 cm2.   * There is mild aortic valve regurgitation.   * Aortic valve regurgitation pressure halftime is 570 ms.   * There is trace mitral valve regurgitation.   * There is moderate tricuspid valve regurgitation.   * Moderate pulmonary hypertension, estimated pulmonary arterial systolic pressure is 51 mmHg.   * There is no pericardial effusion.   * The aortic root at the sinus of Valsalva is normal measuring 2.95 cm with an index of 1.5.   * The proximal ascending aorta is normal measuring 2.88 cm with an index of 1.4 cm/m2.   * For the indication of aortic valve calcification, findings are as  noted. Comparison   * Compared to prior study from 09/14/17, changes include new mild aortic stenosis.   * Prior estimated pulmonary arterial systolic pressure was 48 mmHg.   * Trace aortic and tricuspid regurgitation increased. Patient Info Name:     Susan [name] Age:     49 years DOB:     Mar 24, 1951 Gender:     Female MRN:     [id number] Ht:     67 in Wt:     188 lb BSA:     2.03 m2 BP:     138 /     72 mmHg Heart Rhythm:     Sinus Rhythm Exam Date:     03/21/2024 2:57 PM Patient Status:     OP Exam Type:     ECHO CARDIOGRAM COMPLETE Indications     Aortic valve calcification - Left Ventricle   Left ventricular systolic function is normal with an ejection fraction of 66 % by Simpson's biplane. Left ventricular segmental wall motion is normal. Left ventricular chamber size is normal. Left ventricular diastolic function: indeterminate. Right Ventricle   Tricuspid annular plane systolic excursion (TAPSE) is normal, 2.8 cm. Right ventricular systolic function is normal. Right ventricular chamber dimension is normal. Ventricular Septum   Intact interventricular septum visualized by color Doppler imaging. Left Atrium   Left atrial chamber is normal with a left atrial volume index biplane method of disk (BP MOD) of 24 ml/m^2. Right Atrium   Right atrial chamber size is normal. Atrial Septum   Intact interatrial septum visualized by color Doppler imaging. Aortic Valve   The aortic valve is tricuspid. There is mild aortic valve stenosis with a peak velocity of 2.8 m/s, mean gradient of 15 mmHg, and aortic valve area of 1.59 cm2. There is mild aortic valve regurgitation. Aortic valve regurgitation pressure halftime is 570 ms. The aortic valve dimensionless index by VTI is 0.56. Left ventricular outflow tract stroke volume index is 42.14 ml/m2. Pulmonic Valve   The pulmonic valve is not well visualized. There is no pulmonic valve stenosis. There is mild pulmonic regurgitation. Mitral Valve   The mitral valve has normal  leaflets. There is no mitral valve stenosis. There is trace mitral valve regurgitation. Tricuspid Valve   The tricuspid valve leaflets are normal. There is no tricuspid valve stenosis. There is moderate tricuspid valve regurgitation. Moderate pulmonary hypertension, estimated pulmonary arterial systolic pressure is 51 mmHg. Pericardium/Pleural   There is no pericardial effusion. Inferior Vena Cava   Normal inferior vena cava diameter with >50% collapse upon inspiration consistent with normal right atrial pressure, 3 mmHg. Aorta   The proximal ascending aorta is normal  measuring 2.88 cm with an index of 1.4 cm/m2. The aortic root at the sinus of Valsalva is normal measuring 2.95 cm with an index of 1.5. The aortic measurements are indexed to body surface area. Left Ventricular Outflow Tract ---------------------------------------------------------------------- Name                                 Value        Normal ---------------------------------------------------------------------- LVOT 2D ---------------------------------------------------------------------- LVOT Diameter                       1.9 cm               LVOT Doppler ---------------------------------------------------------------------- LVOT Peak Velocity              124.0 cm/s               LVOT Peak Gradient                6.0 mmHg               LVOT Mean Gradient                3.0 mmHg               LVOT VTI                           30.2 cm                 LVOT Stroke Volume                 85.6 ml               LVOT Stroke Volume Index        42.1 ml/m2               LVOT Area                          2.8 cm2 Mitral Valve ---------------------------------------------------------------------- Name                                 Value        Normal ---------------------------------------------------------------------- MV Diastolic Function ---------------------------------------------------------------------- MV E Peak Velocity               83.1  cm/s               MV A Peak Velocity              117.0 cm/s               MV E/A                                 0.7       0.8-2.0 MV Decel Time PW                    232 ms               MV Annular TDI ---------------------------------------------------------------------- MV Septal e' Velocity             6.5 cm/s         >=7.0 MV E/e' (Septal)  12.8               MV Lateral e' Velocity            7.9 cm/s        >=10.0 MV E/e' (Lateral)                     10.5               MV e' Average                     7.2 cm/s               MV E/e' (Average)                     11.7        <=14.0 Tricuspid Valve ---------------------------------------------------------------------- Name                                 Value        Normal ---------------------------------------------------------------------- TV Regurgitation Doppler ---------------------------------------------------------------------- TR Peak Velocity                345.0 cm/s       <=280.0 TR Peak Gradient                 48.0 mmHg               Estimated PAP/RSVP ---------------------------------------------------------------------- RA Pressure                         3 mmHg            <8 PA Systolic Pressure               51 mmHg           <35 RV Systolic Pressure               51 mmHg           <36 Aorta ---------------------------------------------------------------------- Name                                 Value        Normal ---------------------------------------------------------------------- Ascending Aorta ---------------------------------------------------------------------- Sinus of Valsalva Diameter          3.0 cm               Prox Asc Ao Diameter                2.9 cm         <=3.1 Prox Asc Ao Diameter Index       1.4 cm/m2         <=1.9 Venous ---------------------------------------------------------------------- Name                                 Value        Normal  ---------------------------------------------------------------------- IVC/SVC ---------------------------------------------------------------------- IVC Diameter (Exp 2D)               1.9 cm         <=2.1 Aortic Valve ---------------------------------------------------------------------- Name  Value        Normal ---------------------------------------------------------------------- AV Doppler ---------------------------------------------------------------------- AV Peak Velocity                   2.8 m/s               AV Peak Gradient                 30.0 mmHg               AV Mean Gradient                 15.0 mmHg               AV VTI                             54.0 cm               AV Area (Cont Eq VTI)              1.6 cm2         >=3.0 AV Area Index (Cont Eq VTI)     0.8 cm2/m2               AV Area (Cont Eq Vel)              1.3 cm2               AV Area Index (Cont Eq Vel)     0.6 cm2/m2                AV Dimensionless Index (Peak Velocity)                        0.4                  AV Dimensionless Index (VTI)                                  0.6               AV Regurgitation 2D ---------------------------------------------------------------------- LVOT Area                          2.8 cm2               AV Regurgitation Doppler ---------------------------------------------------------------------- AR Decel Slope                  192.0 cm/s2               AR PHT                              570 ms Ventricles ---------------------------------------------------------------------- Name                                 Value        Normal ---------------------------------------------------------------------- LV Dimensions 2D/MM ----------------------------------------------------------------------  IVS Diastolic Thickness (2D)                                1.3 cm       0.5-0.9 LVID Diastole (2D)  4.5 cm       3.8-5.2  LVPW Diastolic Thickness (2D)                                 1.3 cm       0.5-0.9 LVID Systole (2D)                   3.1 cm       2.2-3.5 LVID Diastolic Index (2D)        2.2 cm/m2       2.3-3.1 LVOT Diameter                       1.9 cm               LV Fractional Shortening/Ejection Fraction 2D/MM ---------------------------------------------------------------------- LV EF (2D Teichholz)                  58 %               LV EF (BP MOD)                        66 %         54-74  LV Fractional Shortening (2D)                                32.9 %     27.0-45.0  LV Diastolic Volume Index (BP MOD)                        24.7 ml/m2     29.0-61.0 LV SV (2C MOD)                     28.0 ml               LV SV (BP MOD)                     33.3 ml               LV SV (Cubed)                      60.3 ml               LV SV (Teich)                      53.5 ml               LV EDV (Cubed)                     90.9 ml               LV ESV (Cubed)                     30.6 ml               LV EDV (Teich)                     92.2 ml               LV ESV (Teich)  38.8 ml               RV Dimensions 2D/MM ----------------------------------------------------------------------  RV Basal Diastolic Dimension                           3.2 cm       2.5-4.1 TAPSE                               2.8 cm         >=1.7 TAPSV                            10.2 cm/s               RV/LV Diam Ratio (Diastole)            0.9 Atria ---------------------------------------------------------------------- Name                                 Value        Normal ---------------------------------------------------------------------- LA Dimensions ---------------------------------------------------------------------- LA Dimension (2D)                   3.0 cm       2.7-3.8 LA Dimen Index (2D)              1.5 cm/m2               LA Volume (BP A-L)                 49.6 ml               LA Volume Index (BP A-L)        24.4 ml/m2         <35.0 LA Volume Index (BP MOD)        23.5  ml/m2        <=34.0 RA Dimensions ----------------------------------------------------------------------  RA Diastolic Major Axis Length (4C)                         4.4 cm                RA Diastolic Major Axis Length Index (4C)                      0.2               RA Area (4C)                      14.4 cm2         <21.0 RA ESV Index (4C MOD)           19.3 ml/m2     15.0-27.0 Wall Motion Scoring Wall Motion Scoring Index:     1.00 Technical Quality:     Fair Complete transthoracic echocardiogram performed with 2D imaging, color Doppler, and spectral Doppler. Staff Referring Provider:     Garnette To Ordering Provider:     Glendia DELENA Marina MD Sonographer:     Luke ONEIDA Finder RDCS [id number] Report Signatures Finalized by Glendia DELENA Marina  MD on 03/21/2024 03:44 PM       I have independently reviewed lab studies and imgaing as well as review of nursing notes  and physican notes from the past 24 hours.    Dragon medical dictation software was used for portions of this report. Unintended errors may occur.     JINNY Julieanne Meadows, MD  April 20, 2024    [facility]   Office [phone]  Fax:[phone]

## 2024-04-20 NOTE — Progress Notes (Signed)
 Assessment/Plan:     Patient seen for complicated medical problems as listed below :    Right foot abscess   S/p I and D in ER  Left foot swelling  PAD bilateral lower extremities  Chronic hypoxic resp failure  COPD  Possible pneumonia  Plan     Vascular surgery following/ABI done right 0.63/left 0.48  CTA lower extremities done showing moderate PAD  Vascular surgery to see again in a.m//await further vascular plan i.e. IR arteriogram?  Further intervention?  Continue to monitor left slight knee effusion seen on CT  Status post right heel debridement and wound VAC placement 7/17  CT scan of left foot reveals a possible 3.8 cm developinG abscess/awaiting further recommendations from podiatry  Infectious disease following  Empiric IV vancomycin /cefepime /Flagyl /infectious disease following  Leukocytosis improving  Awaiting deep cultures/path/Gram stain negative  Continue bronchodilators  O2 supplementation  Dvt ppx with heparin   Continue home regimen for otherwise chronic, stable medical conditions as noted above.     Repeat CBC/electrolytes in a.m.    Reason for continued hospitalization: Ongoing workup further interventions needed on lower extremity abscesses as as well as peripheral arterial disease    EXAM:  GENERAL: No acute distress  RESPIRATORY: Bilateral BS present. No rales or rhonchi.  No wheezing  CARDIOVASCULAR: S1 and S2 present, regular. No murmur  ABDOMEN: Soft and nontender with positive bowel sounds.   NEUROLOGICAL: Cranial nerves II through XII grossly intact. No focal weakness.  Musculoskeletal: Right heel clean bandages/no draining abscess left foot    Subjective:     No acute complaints at present, reviewed current hospital course antibiotic administration with patient today.  She still is asking for pain meds for her right foot which is in a wound VAC.      12 point review of systems otherwise negative    Vitals:    04/20/24 0613 04/20/24 0755 04/20/24 1153 04/20/24 1620   BP: (!) 140/69 (!)  137/57 139/69 (!) 160/73   Pulse: 88 88 82 89   Resp: 19 18 18 18    Temp: 98.4 F (36.9 C)  98.4 F (36.9 C) 98.2 F (36.8 C)   TempSrc: Temporal Temporal Temporal Temporal   SpO2: 94% 94% 94% 94%   Weight:       Height:           Recent Labs     04/20/24  0438   WBC 12.9*   HGB 7.7*   HCT 25.2*   MCV 96.9   PLT 358     Recent Labs     04/20/24  0438   NA 140   K 4.7   CL 106   CO2 27   ANIONGAP 7   CALCIUM  9.1   BUN 19   CREATININE 0.76   GLUCOSE 110*     No results for input(s): LABALBU, TP, AST, ALKPHOS, BILITOT, BILIDIR, ALT, GLOB in the last 72 hours.    Invalid input(s): AGRAT    Lab Results   Component Value Date/Time    COLORU Yellow 04/15/2024 03:02 AM    CLARITYU Clear 04/15/2024 03:02 AM    GLUCOSEU Negative 04/15/2024 03:02 AM    BILIRUBINUR Negative 04/15/2024 03:02 AM    KETUA Negative 04/15/2024 03:02 AM    BLOODU Trace-lysed 04/15/2024 03:02 AM    PHUR 5.5 04/15/2024 03:02 AM    NITRU Negative 04/15/2024 03:02 AM    LEUKOCYTESUR Trace 04/15/2024 03:02 AM     No results for input(s): POCGLU in  the last 72 hours.          Chrisma Hurlock Md  [facility]

## 2024-04-21 ENCOUNTER — Inpatient Hospital Stay: Admit: 2024-04-21 | Payer: Medicare (Managed Care) | Primary: Family Medicine

## 2024-04-21 LAB — CBC WITH AUTO DIFFERENTIAL
Basophils: 0.3 % (ref 0–3)
Eosinophils: 1.4 % (ref 0–5)
Hematocrit: 28.1 % — ABNORMAL LOW (ref 35.0–47.0)
Hemoglobin: 8.3 g/dL — ABNORMAL LOW (ref 11.0–16.0)
Immature Granulocytes %: 0.7 % (ref 0.0–3.0)
Lymphocytes: 8.5 % — ABNORMAL LOW (ref 28–48)
MCH: 28.9 pg (ref 25.4–34.6)
MCHC: 29.5 g/dL — ABNORMAL LOW (ref 30.0–36.0)
MCV: 97.9 fL (ref 80.0–98.0)
MPV: 10.6 fL — ABNORMAL HIGH (ref 6.0–10.0)
Monocytes: 8.1 % (ref 1–13)
Neutrophils Segmented: 81 % — ABNORMAL HIGH (ref 34–64)
Nucleated RBCs: 0 (ref 0–0)
Platelets: 413 1000/mm3 (ref 140–450)
RBC: 2.87 M/uL — ABNORMAL LOW (ref 3.60–5.20)
RDW: 56.5 — ABNORMAL HIGH (ref 36.4–46.3)
WBC: 13 1000/mm3 — ABNORMAL HIGH (ref 4.0–11.0)

## 2024-04-21 LAB — MAGNESIUM: Magnesium: 1.6 mg/dL (ref 1.6–2.6)

## 2024-04-21 LAB — PROCALCITONIN: Procalcitonin: 0.19 ng/mL (ref 0.00–0.50)

## 2024-04-21 LAB — LACTIC ACID: Lactate: 0.8 mmol/L (ref 0.5–2.2)

## 2024-04-21 MED ORDER — FENTANYL CITRATE (PF) 100 MCG/2ML IJ SOLN
100 | INTRAMUSCULAR | Status: AC
Start: 2024-04-21 — End: 2024-04-22

## 2024-04-21 MED ORDER — IODIXANOL 320 MG/ML IV SOLN
320 | Freq: Once | INTRAVENOUS | Status: AC | PRN
Start: 2024-04-21 — End: 2024-04-21
  Administered 2024-04-21: 20:00:00 25 mL via INTRA_ARTERIAL

## 2024-04-21 MED ORDER — FLUMAZENIL 0.5 MG/5ML IV SOLN
0.5 | INTRAVENOUS | Status: DC | PRN
Start: 2024-04-21 — End: 2024-04-21

## 2024-04-21 MED ORDER — FENTANYL CITRATE (PF) 100 MCG/2ML IJ SOLN
100 | INTRAMUSCULAR | Status: AC | PRN
Start: 2024-04-21 — End: 2024-04-21
  Administered 2024-04-21 (×2): 50 via INTRAVENOUS
  Administered 2024-04-21: 19:00:00 25 via INTRAVENOUS

## 2024-04-21 MED ORDER — LIDOCAINE HCL (PF) 1 % IJ SOLN
1 | INTRAMUSCULAR | Status: AC | PRN
Start: 2024-04-21 — End: 2024-04-21
  Administered 2024-04-21: 19:00:00 8 via INTRADERMAL

## 2024-04-21 MED ORDER — HEPARIN (PORCINE) IN NACL 1000-0.9 UT/500ML-% IV SOLN
1000-0.9 | INTRAVENOUS | Status: DC
Start: 2024-04-21 — End: 2024-04-21

## 2024-04-21 MED ORDER — SODIUM CHLORIDE 0.9 % IV SOLN
0.9 | Freq: Once | INTRAVENOUS | Status: DC
Start: 2024-04-21 — End: 2024-04-21

## 2024-04-21 MED ORDER — MIDAZOLAM HCL 2 MG/2ML IJ SOLN
2 | INTRAMUSCULAR | Status: AC
Start: 2024-04-21 — End: 2024-04-22

## 2024-04-21 MED ORDER — HEPARIN SODIUM (PORCINE) 1000 UNIT/ML IJ SOLN
1000 | INTRAMUSCULAR | Status: AC | PRN
Start: 2024-04-21 — End: 2024-04-21
  Administered 2024-04-21: 19:00:00 7000 via INTRAVENOUS

## 2024-04-21 MED ORDER — ASPIRIN 81 MG PO TBEC
81 | Freq: Every day | ORAL | Status: DC
Start: 2024-04-21 — End: 2024-05-02
  Administered 2024-04-21 – 2024-05-02 (×11): 81 mg via ORAL

## 2024-04-21 MED ORDER — CLOPIDOGREL BISULFATE 75 MG PO TABS
75 | Freq: Once | ORAL | Status: AC
Start: 2024-04-21 — End: 2024-04-21
  Administered 2024-04-21: 22:00:00 300 mg via ORAL

## 2024-04-21 MED ORDER — MIDAZOLAM HCL 2 MG/2ML IJ SOLN
2 | INTRAMUSCULAR | Status: DC | PRN
Start: 2024-04-21 — End: 2024-04-21

## 2024-04-21 MED ORDER — NALOXONE HCL 0.4 MG/ML IJ SOLN
0.4 | INTRAMUSCULAR | Status: DC | PRN
Start: 2024-04-21 — End: 2024-04-21

## 2024-04-21 MED ORDER — CLOPIDOGREL BISULFATE 75 MG PO TABS
75 | Freq: Every day | ORAL | Status: DC
Start: 2024-04-21 — End: 2024-05-02
  Administered 2024-04-22 – 2024-05-02 (×10): 75 mg via ORAL

## 2024-04-21 MED ORDER — SOYBEAN OIL LUBRICANT INJECTION
2.5 | INTRAVENOUS | Status: AC | PRN
Start: 2024-04-21 — End: 2024-04-21
  Administered 2024-04-21: 19:00:00 via INTRA_ARTERIAL

## 2024-04-21 MED ORDER — FENTANYL CITRATE (PF) 100 MCG/2ML IJ SOLN
100 | INTRAMUSCULAR | Status: DC | PRN
Start: 2024-04-21 — End: 2024-04-21

## 2024-04-21 MED ORDER — LIDOCAINE HCL 1 % IJ SOLN
1 | Freq: Once | INTRAMUSCULAR | Status: DC
Start: 2024-04-21 — End: 2024-04-21

## 2024-04-21 MED ORDER — MIDAZOLAM HCL 2 MG/2ML IJ SOLN
2 | INTRAMUSCULAR | Status: AC | PRN
Start: 2024-04-21 — End: 2024-04-21
  Administered 2024-04-21 (×2): 1 via INTRAVENOUS

## 2024-04-21 MED FILL — ASPIRIN LOW DOSE 81 MG PO TBEC: 81 mg | ORAL | Qty: 1 | Fill #0

## 2024-04-21 MED FILL — CEFEPIME HCL 1 G IJ SOLR: 1 g | INTRAMUSCULAR | Qty: 1000 | Fill #0

## 2024-04-21 MED FILL — HEPARIN SODIUM (PORCINE) 5000 UNIT/ML IJ SOLN: 5000 [IU]/mL | INTRAMUSCULAR | Qty: 1 | Fill #0

## 2024-04-21 MED FILL — FENTANYL CITRATE (PF) 100 MCG/2ML IJ SOLN: 100 MCG/2ML | INTRAMUSCULAR | Qty: 4 | Fill #0

## 2024-04-21 MED FILL — METRONIDAZOLE 500 MG/100ML IV SOLN: 500 MG/100ML | INTRAVENOUS | Qty: 100 | Fill #0

## 2024-04-21 MED FILL — CLOPIDOGREL BISULFATE 75 MG PO TABS: 75 mg | ORAL | Qty: 4 | Fill #0

## 2024-04-21 MED FILL — MORPHINE SULFATE 2 MG/ML IJ SOLN: 2 mg/mL | INTRAMUSCULAR | Qty: 1 | Fill #0

## 2024-04-21 MED FILL — MIDAZOLAM HCL 2 MG/2ML IJ SOLN: 2 mg/mL | INTRAMUSCULAR | Qty: 2 | Fill #0

## 2024-04-21 MED FILL — OXYCODONE-ACETAMINOPHEN 7.5-325 MG PO TABS: 7.5-325 mg | ORAL | Qty: 1 | Fill #0

## 2024-04-21 MED FILL — LIDOCAINE HCL 1 % IJ SOLN: 1 % | INTRAMUSCULAR | Qty: 20 | Fill #0

## 2024-04-21 MED FILL — ONDANSETRON HCL 4 MG/2ML IJ SOLN: 4 MG/2ML | INTRAMUSCULAR | Qty: 2 | Fill #0

## 2024-04-21 MED FILL — CLOPIDOGREL BISULFATE 300 MG PO TABS: 300 mg | ORAL | Qty: 1 | Fill #0

## 2024-04-21 MED FILL — VISIPAQUE 320 MG/ML IV SOLN: 320 mg/mL | INTRAVENOUS | Qty: 100 | Fill #0

## 2024-04-21 MED FILL — CLOPIDOGREL BISULFATE 75 MG PO TABS: 75 mg | ORAL | Qty: 1 | Fill #0

## 2024-04-21 MED FILL — VANCOMYCIN (VANCOCIN) 1000 MG IN SODIUM CHLORIDE 0.9% 250 ML IVPB: Qty: 250 | Fill #0

## 2024-04-21 NOTE — Progress Notes (Signed)
 NUTRITION RECOMMENDATIONS:   Continue NPO status and resume diet as soon as medically feasible   Recommend supplements when diet advanced:  Juven BID (provides 95 kcals, 2.5 g collagen protein, 300 mg vitamin C, 9.5 mg zinc each)  Glucerna BID (220 kcals, 10 g protein each)  Monitor I/O's  Weight 3x weekly to trend   Discharge recommendations: anticipate 4 CCHO diet     NUTRITION INITIAL EVALUATION    NUTRITION ASSESSMENT:  Wound; LOS      Principal Diagnosis:  Foot abscess     PMH: History reviewed. No pertinent past medical history.    Pertinent Medications: vanc, flagyl , heparin      Pertinent Labs:   Lab Results   Component Value Date    NA 140 04/20/2024    K 4.7 04/20/2024    CL 106 04/20/2024    CO2 27 04/20/2024    BUN 19 04/20/2024    CREATININE 0.76 04/20/2024    GLUCOSE 110 (H) 04/20/2024    CALCIUM  9.1 04/20/2024    BILITOT 0.40 04/16/2024    ALKPHOS 82 04/16/2024    AST 14.0 04/16/2024    ALT 11 04/16/2024    LABGLOM >60 04/20/2024    GFRAA >60.0 04/20/2024     Anthropometrics:  Height:   Ht Readings from Last 3 Encounters:   04/17/24 1.702 m (5' 7)   04/08/24 1.702 m (5' 7)   12/19/23 1.702 m (5' 7)      Weight:   Wt Readings from Last 3 Encounters:   04/17/24 81.6 kg (179 lb 14.3 oz)   04/08/24 81.6 kg (180 lb)   12/19/23 88.5 kg (195 lb)     BMI: Body mass index is 28.18 kg/m.               UBW: 195lb per EMR 12/2023,  unable to verify   Wt Change: has decreased 15 pounds over last 4 months (7.7%) - unsure of accuracy, all stated weighs per EMR  Current Diet Order: Diet NPO Exceptions are: Sips of Water  with Meds  GI Symptoms/Issues: none at this time   Chewing/Swallowing Issues: none at this time, NPO    Skin Integrity: x1 PIV, L heel PI, sacrum stg 1 PI, wound & wound vac to R heel   Fluid Accumulation/Edema Documentation per Nursing Flowsheets:   Edema: Right lower extremity, Left lower extremity        Per I/Os, pt is -1.78 L since admit 7/14.    Physical Assessment: on 04/21/24 - not  assessed, pt out of room      Diet and Intake History:   Food Allergies:  NKFA     Subjective:  Pt out of room in procedures. No intake history per EMR to evaluate x1 week. Recommend supplements to aid in wound healing and encourage intake when diet resumed.     % EEN met prior to admission:    [x]   UTA/Other:  out of room     Current Appetite/PO Intake: Most recent documentation in flow sheets dated 7/21, pt with  PO Meals Eaten (%): 1 - 25% intake this morning. No previous intake history per EMR.     GLIM Criteria Met:  [x]  Inflammation (Specify): albumin 2.4     Estimated Daily Nutrition Needs:      81.6 kg   -- []    Admit  [x]  Actual  []  Ideal   1632 - 2040 kcals (20-25 kcal/kg)   82 - 98 g protein (1-1.2 g/kg)  1632 mL fluid (20 ml/kg or per MD)     Assessment of Current MNT:   Current NPO order is clinically appropriate, but diet should be advanced as soon as medically feasible to meet nutrition needs and decrease risk of malnutrition.      NUTRITION DIAGNOSIS:     Increased nutrient needs (protein/kcal) related to metabolic demands as evidenced by wounds POA.    NUTRITION INTERVENTION / RECOMMENDATIONS:     Continue NPO status and resume diet as soon as medically feasible   Recommend supplements when diet advanced:  Juven BID (provides 95 kcals, 2.5 g collagen protein, 300 mg vitamin C, 9.5 mg zinc each)  Glucerna BID (220 kcals, 10 g protein each)  Monitor I/O's  Weight 3x weekly to trend   Discharge recommendations: anticipate 4 CCHO diet     NUTRITION MONITORING AND EVALUATION:     Nutrition Level of Care:   moderate    Monitoring and Evaluation: PO intake, wt trends, nutrition related labs, skin integrity, and if applicable, supplement acceptance and/or nutrition support tolerance.     Nutrition Goals:  Pt to meet/tolerate >/= 75% of estimated needs, weight maintenance throughout LOS, BG control <180, Improvement and/or maintenance of skin integrity and nutrition related labs.     Code Status: Full Code  No additional code details    Cherri Yera, RD, LD   04/21/24   Office: Ext [phone]

## 2024-04-21 NOTE — Procedures (Signed)
 Vascular procedure note 04/21/2024    Pre-op diagnosis: Right foot wound with known PAD  Postop diagnosis: Same due to diffuse SFA stenosis and severe tibial disease  Procedure: #1 ultrasound-guided left common femoral artery access.  #2 right lower extremity angiogram.  #3 right SFA and above-knee popliteal orbital atherectomy (1.5 diamondback).  #4 right SFA and above-knee popliteal balloon angioplasty (5 x 300).  #5 Mynx closure left common femoral artery access.  Surgeon: Maree  Anesthesia: Local with moderate sedation  Complications: None    Indications: This is a 73 year old African-American female with known PAD who has previously undergone right SFA stenting and presents with a right lateral foot abscess which was incised and drained by podiatry.  Due to her known PAD and relatively poor bleeding at the time of the procedure she is brought to the angio suite for diagnostic angiogram and possible intervention.    Procedure: The patient was brought to the angio suite, placed supine and the left groin prepped and draped in usual sterile manner.  Moderate sedation was administered by nurse under my direct supervision with continuous cardiopulmonary function being monitored without complication.  Please see the record for time sedation and medications provided.  Ultrasound evaluation revealed a widely patent left common femoral artery.  Images were stored for permanent record and then under direct ultrasound guidance and 1% Xylocaine  local anesthesia the artery was accessed with a micropuncture needle and a 4 French sheath placed.  Bentson wire used to advance a universal flush catheter into the aorta and then over the bifurcation to the right external iliac.  Right leg runoff was then performed.  Right common femoral and profunda femoris are widely patent.  The first few centimeters of SFA are patent and then it is diffusely diseased to down to the stent in the adductor hiatus.  Just prior to the stent and just  distal to the stent at the edges there are high-grade stenoses.  The above-knee popliteal is widely patent.  Below-knee popliteal is widely patent.  There is single-vessel peroneal runoff to just above the ankle.  There is poor reconstitution of the distal AT and PT via the peroneal collaterals.  We decided to at least fix the inflow and therefore we upsized the short 4 French sheath to a 5 [place] 55 cm sheath advanced over the bifurcation to the right common femoral.  Patient was then fully heparinized with 7000 units of intravenous heparin  and a SPECT catheter was used with a Glidewire to traverse the entire SFA and we were true luminal into the below-knee popliteal.  We exchanged the Glidewire for a Viper wire and then used a 1.5 diamondback orbital atherectomy catheter to perform orbital atherectomy from the proximal SFA all the way through the stent into the distal SFA and above-knee popliteal.  This was followed with a 5 x 300 angioplasty.  Completion angiogram revealed wide patency of the SFA and popliteal with preserved single-vessel peroneal runoff.  All devices were removed.  The long 5 French sheath was exchanged for a short 5 French sheath and Mynx closure performed with good hemostasis.  Dry sterile dressing placed the patient transported to recovery in stable condition.  There were no complications.    Kaitelyn Jamison M. Maree, MD, FACS  Office:  [phone]  Pager:  [phone]

## 2024-04-21 NOTE — Progress Notes (Signed)
 1200 wound care nurse at bedside. Pt getting ready to go to angiogram. Pt did not have wound vac changed today.

## 2024-04-21 NOTE — Progress Notes (Signed)
 Assessment/Plan:     Patient seen for complicated medical problems as listed below :    Right foot abscess   Left foot abscess  S/p I and D in ER  PAD bilateral lower extremities  Chronic hypoxic resp failure  COPD  Possible pneumonia  Plan     Vascular surgery planning angiogram today, greatly appreciate assist.  Reviewed CT, showed bilateral foot abscess.  7/17 s/p OR with I&D and placement of wound VAC on right foot-or cultures negative so far and awaiting bone path. 7/19 CT still showing multiple abscesses in the right midfoot and subcu area.  Left foot also showed multiple abscess, will need further I&D.  Podiatry is on board, timing per podiatrist.  CTA lower extremities done showing moderate PAD  Empiric IV vancomycin /cefepime /Flagyl . infectious disease following  Continue bronchodilators  O2 supplementation  Dvt ppx with heparin  subcu  Discussed with patient, patient daughter, Dr. Meade   Continue home regimen for otherwise chronic, stable medical conditions as noted above.   I will order CBC and renal function for tomorrow    Reason for continued hospitalization: Multiple foot abscess bilaterally    EXAM:  GENERAL: in mild distress.   RESPIRATORY: Bilateral BS present. No rales or rhonchi.   CARDIOVASCULAR: S1 and S2 present, regular. No murmur  ABDOMEN: Soft and nontender with positive bowel sounds.   NEUROLOGICAL: Cranial nerves II through XII grossly intact. No focal weakness.     Subjective:         Vitals:    04/21/24 0604 04/21/24 0815 04/21/24 1034 04/21/24 1239   BP:  125/77  109/64   Pulse:  73  71   Resp: 18 18 19 18    Temp:  97.5 F (36.4 C)  98.8 F (37.1 C)   TempSrc:  Temporal  Temporal   SpO2:  100%  100%   Weight:       Height:           Recent Labs     04/21/24  0615   WBC 13.0*   HGB 8.3*   HCT 28.1*   MCV 97.9   PLT 413     Recent Labs     04/20/24  0438   NA 140   K 4.7   CL 106   CO2 27   ANIONGAP 7   CALCIUM  9.1   BUN 19   CREATININE 0.76   GLUCOSE 110*     No results for input(s):  LABALBU, TP, AST, ALKPHOS, BILITOT, BILIDIR, ALT, GLOB in the last 72 hours.    Invalid input(s): AGRAT  Lab Results   Component Value Date/Time    COLORU Yellow 04/15/2024 03:02 AM    CLARITYU Clear 04/15/2024 03:02 AM    GLUCOSEU Negative 04/15/2024 03:02 AM    BILIRUBINUR Negative 04/15/2024 03:02 AM    KETUA Negative 04/15/2024 03:02 AM    BLOODU Trace-lysed 04/15/2024 03:02 AM    PHUR 5.5 04/15/2024 03:02 AM    NITRU Negative 04/15/2024 03:02 AM    LEUKOCYTESUR Trace 04/15/2024 03:02 AM     No results for input(s): POCGLU in the last 72 hours.      Discussed with patient and family regarding management, prognosis, treatment and complications of the patient's medical conditions in detail. All questions answered  to the satisfaction of those individual(s) who also verbalized understanding of and agreement with the assessment and plan.     Dr. [place], Garnette RAMAN, MD, thank you for allowing us  to participate in  the care of this patient.  Please contact me through the hospital if questions arise.  Dragon medical dictation software was used for portions of this report. Unintended errors may occur.     Olam Campi D.O.  [facility]

## 2024-04-21 NOTE — OR Nursing (Signed)
 Patient positioned supine on table. Oxygen 6L by simple mask. Bilateral groin prep completed. Skin prep and draped per procedural protocol. CM applied.

## 2024-04-21 NOTE — Progress Notes (Signed)
 Vascular    BP 113/77   Pulse 80   Temp 98.8 F (37.1 C) (Temporal)   Resp 18   Ht 1.702 m (5' 7)   Wt 81.6 kg (179 lb 14.3 oz)   SpO2 99%   BMI 28.18 kg/m     Alert, foot hurts  Discussed angiogram and she is OK to proceed    Unfortunately very busy Monday in OR and angio  Will allow to eat today and have scheduled tomorrow when more time available  She understands    Thanks  Adnan Vanvoorhis M. Maree, MD, FACS  Office:  [phone]  Pager:  [phone]

## 2024-04-21 NOTE — Progress Notes (Signed)
 INFECTIOUS DISEASE FOLLOW UP NOTE     Date of admission: 04/14/2024     Date of consult: April 15, 2024        ABX:      Current abx Prior abx    Cefepime /Vanco 7/14-7  Flagyl  7/15-6        ASSESSMENT:       Right foot abscess  -Unclear cause of swelling and pain that has been present for several months  -Has been evaluated by orthopedic  -Patient seen by [facility] specialist Dr. Curtistine Emperor with last visit 03/11/2024 for right contusion of dorsum of foot, lower extremity edema and right foot pronation deformity  -03/08/2024 MRI right hindfoot no fracture.  Mild multifocal osseous edema?  Altered gait.  And mechanical stress-related changes.  Mild intrinsic foot muscular atrophic and intramuscular edema.  Subcu edema along the dorsum of the foot  -xray no fracture  -S/p I&D by ED pending cultures   - Patient seen by podiatrist Dr. Roberts   - Awaiting CT foot    Left foot swelling  -Xray no fracture  -Low threshold for   Leucocytosis POA   AKI/CKD  -Abnormal creatinine few weeks ago on routine blood work at [employer]   PAD  -Patient has been following with vascular surgeon and last seen in office on 04/08/2024  -12/2023 ABI at [facility] with severe left lower extremity arterial insufficiency at rest and right first digital disease with known proximal arterial insufficiency  -12/2023 s/p right lower extremity angiogram showed single-vessel peroneal runoff with good branches   Ch hypoxic resp failure   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  -Has been following with neurosurgery at [facility] artery aneurysm  -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 and following with [facility] neurosurgery with plan for continued management   Ax- PCN -as a child   Co-morb- COPD, schizoaffective disorder, HTN,  depression,             RECOMMENDATIONS:      Complicated 73 year old female with significant comorbidities coming with bilateral feet swelling, right worse than left    - Patient afebrile but Tmax 100.9, intermittent tachycardia, on nasal cannula  -WBC 13, Hb 8.3, PLT 413, BUN 19, CR 0.76, lactic acid 0.8, Pro-Cal 0.19, last Vanco trough 19.3  -Micro 7/17 wound right foot Gram stain negative, culture IP, ANA NTD, right heel bone Gram stain negative, culture negative, ANA NTD, 7/15 urine less than 10k, superficial wound culture negative, 7/14 blood culture x 2 negative  -Imaging 7/19 CTA moderate grade stenosis of mid right SFA and patent SFA stent.  Patent right popliteal artery with single-vessel runoff to the right foot.  Diffuse moderate disease of the left SFA with patent popliteal artery and diseased two-vessel runoff to the left foot.  Moderate grade stenosis in the proximal left posterior tibial artery.  Open wound in right foot with subcu gas and multiple abscesses in the midfoot along the plantar surface and dorsal subcu tissues.  Poorly defined fluid collections in the left foot in the subcu tissue of the anterior left knee which are concerning for additional abscesses       Assessment     Right foot abscess  - Coming with swelling, pain of right foot   - Understand I&D in ED by provider and cultures negative so far  - CT concerning for abscess on right foot  - 7/17 s/p OR  with I&D and placement of wound VAC -or cultures negative so far and awaiting bone path  - 7/19 CT still showing multiple abscesses in the midfoot and subcu area-await further input from podiatry  - Continue with local wound care, limb elevation and broad-spectrum antibiotic for now     Left foot swelling  - Tenderness on dorsal aspect  - 7/17 CT suggestive of cellulitis with focal area of collection?  Abscess and CTA 7/19 poorly defined collections foot and subcu left knee  -7/19 CTA with poorly defined fluid collections in the left  foot  -Await further input from podiatry     PAD  -Previous history of angio but recent ABI with monophasic waveforms in femoral and plan for angio per vascular  -Vascular study 7/16 moderate right iliac-femoral, severe left arterial insufficiency at femoropopliteal region  - Complains of increasing BLE pain today question from this versus other, denies history of gout  - Vascular following and awaiting angio     Bilateral leg edema  - Recent PVL 7/8 was negative for DVT but low threshold to repeat     Leukocytosis likely reactive and monitor  - Improved     AKI?  CKD  - Avoid nephrotoxic medications and dose for current creatinine clearance  - Renal ultrasound without any stone or obstructionOther details as above     Other details as above     Rec:  -Continue IV Vanco, cefepime  and Flagyl  for now  - Continue to monitor OR cultures from 7/17 and also await path  - Based on recent CT, still with bilateral feet abscesses and await further input from podiatry regarding subsequent drainag  -Await vascular procedure  - Continue to monitor left knee with question left knee fluid collection on CTA  - Keep legs elevated on pillows  - Monitor serial labs  - Monitor for any ADE  - Remains at risk for worsening                  MICROBIOLOGY:      7/14 Blcx x2 IP              Wd cx IP  7/15 Ucx IP  7/17 anaerobic x 2 IP wound NOS, NGTD surgical wound NGTD right heel     LINES AND CATHETERS:      PIV     SUMMARY:      Susan [name] is a 73 year old African-American female with past medical history of COPD, schizoaffective disorder, hypertension, depression, PAD, peripheral neuropathy was admitted on 7/14 with worsening leg pain.     Patient is not a good historian and says lives with her daughter.  She has been almost bedbound for quite some time. Pt has been following with [facility] for basilar artery aneurysm and conservative Mx.  Patient also developed right leg swelling for which she was referred to Ortho and eventually podiatry.   It was not thought to be the part of her back leading to right leg swelling and neuropathy.  Patient also following with vascular and had procedure done in March with good runoff and did not think was concern for pain and swelling.  Patient last seen by podiatry 03/11/2024 and plan for conservative management.      She was seen in ED on 8 July with leg swelling.  Workup showed negative PVL for DVT and chest x-ray without any significant finding.  She was noted with pitting edema and was given some Lasix  and asked to follow-up  with PCP for adjustment in medications.  Patient returned 7/14 with worsening swelling of the legs, right worse than left.  Workup showed stable vitals, afebrile state.  WBC elevated at 19, Hb 9.8, PLT 354, BUN 34, CR 1.43, AST 17, ALT 14.  Blood cultures were obtained.  Patient had bilateral feet x-ray which showed no fracture or concern for osteo.  Chest x-ray was suggestive of left basilar opacity with developing pneumonia versus atelectasis.  Patient had I&D in the ED.  Podiatry was consulted and CT was ordered.  Wound culture in progress.  Patient on broad-spectrum antibiotic with cefepime  and Vanco.  ID was asked for further evaluation management.       SUBJECTIVE :     Interval notes reviewed.  Patient afebrile.  Continues to complain of pain in her legs and feet.  Did not allow any exam of right foot.  Left foot also remains tender though improved swelling.  Plan for vascular procedure, patient says today.  No family at bedside.    OBJECTIVE     BP 113/77   Pulse 80   Temp 98.8 F (37.1 C) (Temporal)   Resp 18   Ht 1.702 m (5' 7)   Wt 81.6 kg (179 lb 14.3 oz)   SpO2 99%   BMI 28.18 kg/m     Temp (24hrs), Avg:99.1 F (37.3 C), Min:98.2 F (36.8 C), Max:100.9 F (38.3 C)    General: Fairly developed and nourished 73 y.o. year-old, female, chronically sick  HEENT: Normocephalic, anicteric sclerae, Pupils equal, round reactive to light, no oropharyngeal lesions. No sinus  tenderness.  Neck: Supple, no lymphadenopathy, masses or thyromegaly  Chest: Symmetrical expansion  Lungs: Clear to auscultation bilaterally, no dullness  Heart: Regular rhythm, no murmur, no rub or gallop, No JVD  Abdomen: Soft, non-tender,non distended, no organomegaly, BS+  Musculoskeletal: Limited exam of feet given pain.  Right leg in wound VAC.  Left foot remains swollen, tender mainly on dorsal aspect, left knee no obvious subcutaneous abscess or ballotable fluid  CNS: AAOx3.  Follows simple commands.  SKIN: No obvious   skin lesion or rash. Dry, warm, intact         MEDICATIONS:     Current Facility-Administered Medications   Medication Dose Route Frequency Provider Last Rate Last Admin    albuterol  (PROVENTIL ) (2.5 MG/3ML) 0.083% nebulizer solution 2.5 mg  2.5 mg Nebulization Q4H PRN Rosslyn Scrape, MD        [START ON 04/22/2024] Vancomycin  Trough Due  1 each Other Once Sahibzada, Ayaz, MD        ipratropium 0.5 mg-albuterol  2.5 mg (DUONEB ) nebulizer solution 1 Dose  1 Dose Inhalation Q4H PRN Sahibzada, Ayaz, MD        vancomycin  (VANCOCIN ) 1000 mg in sodium chloride  0.9% 250 mL IVPB  1,000 mg IntraVENous Q24H Matriano, John Paul H, MD   Stopped at 04/20/24 2357    senna (SENOKOT) tablet 8.6 mg  1 tablet Oral Daily PRN Matriano, Norleen Deward DEL, MD        docusate sodium  (COLACE) capsule 100 mg  100 mg Oral BID PRN Matriano, Norleen Deward DEL, MD        ondansetron  (ZOFRAN ) injection 4 mg  4 mg IntraVENous Q6H PRN Matriano, Norleen Deward DEL, MD        acetaminophen  (TYLENOL ) tablet 650 mg  650 mg Oral Q6H PRN Matriano, John Paul H, MD   650 mg at 04/18/24 1054    melatonin tablet 3 mg  3 mg Oral Nightly PRN Matriano, Norleen Deward DEL, MD        naloxone  (NARCAN ) injection 0.4 mg  0.4 mg IntraVENous PRN Murrell, Norleen Deward DEL, MD        glucagon  injection 1 mg  1 mg IntraMUSCular PRN Murrell Norleen Deward DEL, MD        dextrose  50 % IV solution  20-30 mL IntraVENous PRN Murrell, Norleen Deward DEL, MD        heparin  (porcine) injection  5,000 Units  5,000 Units SubCUTAneous BID Sahibzada, Ayaz, MD   5,000 Units at 04/20/24 2020    benzonatate  (TESSALON ) capsule 100 mg  100 mg Oral TID PRN Murrell Norleen Deward DEL, MD        cefepime  (MAXIPIME ) 1,000 mg in sodium chloride  0.9 % 50 mL IVPB (mini-bag)  1,000 mg IntraVENous Q12H Matriano, Norleen Deward DEL, MD   Stopped at 04/21/24 [id number]    *Pharmacy to Dose Vancomycin   1 each Other RX Placeholder Matriano, Norleen Deward DEL, MD        morphine  (PF) injection 1 mg  1 mg IntraVENous Q3H PRN Murrell Norleen Deward DEL, MD   1 mg at 04/19/24 2007    sodium hypochlorite (DAKINS) 0.125 % external solution   Irrigation Daily Anastasio Cain A, DPM        metroNIDAZOLE  (FLAGYL ) 500 mg in 0.9% NaCl 100 mL IVPB premix  500 mg IntraVENous Q8H Meade Viviane RAMAN, MD   Stopped at 04/21/24 0117    oxyCODONE -acetaminophen  (PERCOCET ) 7.5-325 MG per tablet 1 tablet  1 tablet Oral Q4H PRN Josepha Planas, DO   1 tablet at 04/21/24 0604         Labs: Results:   Chemistry Recent Labs     04/19/24  0523 04/20/24  0438   NA 140 140   K 4.7 4.7   CL 106 106   CO2 25 27   BUN 20 19      CBC w/Diff Recent Labs     04/19/24  0523 04/20/24  0438 04/21/24  0615   WBC 12.7* 12.9* 13.0*   RBC 2.75* 2.60* 2.87*   HGB 7.9* 7.7* 8.3*   HCT 27.1* 25.2* 28.1*   PLT 351 358 413          RADIOLOGY :        Imaging  CTA ABDOMINAL AORTA W BILAT RUNOFF W WO CONTRAST  Result Date: 04/19/2024  HISTORY: peripheral artery disease Technique: Noncontrast CT imaging is acquired through the chest for bolus tracking purposes. Enhanced CT imaging through the abdomen, pelvis, and lower extremities is performed during arterial phase of IV contrast menstruation as part of a CTA runoff. Angiographic reconstructions are performed formed at a separate workstation. The patient received an uneventful IV bolus of 100 mL of Omnipaque 350 nonionic contrast as part of the study. COMPARISON STUDY: none FINDINGS: Vascular structures and lower extremities: Abdominal aorta is normal in caliber  and enhancement. Celiac, SMA, and renal arteries are moderately calcified yet patent with no high-grade stenosis. IMA is patent. Mild narrowing of the left common iliac artery. The right common iliac and external iliac arteries are patent. Hypogastric arteries are patent. On the right, common femoral and profunda are patent. Diffuse mild disease of the right SFA with moderate grade narrowing in the mid SFA just proximal to the patent stent. The right popliteal artery is patent. Single-vessel runoff to the right foot through the peroneal artery. There is gradual diminished enhancement in  the anterior and posterior tibial arteries. The ankle. Ulcerations are present in the right foot with subcutaneous gas and a vacuum. 2 cm abscess in the plantar subcutaneous tissues with an air-fluid level noted. Additional 2.4 cm abscess in the dorsal subcutaneous tissues of the mid foot noted with an air-fluid level. On the left, common femoral artery is mildly narrowed secondary to plaque and the profunda is patent. Diffuse moderate disease of the left SFA with partially calcified plaque. Popliteal artery is patent. Moderate grade stenosis at the proximal posterior tibial artery. Two-vessel runoff via the posterior tibial and peroneal arteries. Diminished enhancement noted in the distal anterior tibial artery. Along the lateral aspect of the midfoot there is a 2.5 x 4.6 cm area of low density which may represent a developing abscess or phlegmon. There is also a region of edema in the anterior left lower leg at the level of the ankle. At the level of the knee, there is a irregular shaped fluid collection in the anterolateral knee just lateral to the tibial tuberosity which measures approximately 4.2 cm in diameter. Bibasilar subsegmental atelectasis. Heart size is borderline enlarged. The liver has a slight nodular contour. The gallbladder is present and contains excreted contrast. The spleen, pancreas, and adrenal glands are  within normal notes. 2.6 cm left renal cyst. Kidneys are otherwise normal in morphology with no hydronephrosis. Appendix and small bowel loops are unremarkable. Diverticulosis is noted throughout the colon. No free intraperitoneal fluid or air. Uterus contains a coarsely calcified leiomyoma. No adnexal or pelvic mass. Urinary bladder is unremarkable. Osseous structures the pelvis and lumbar spine are intact. No compression deformities.     IMPRESSION: 1. No aortic inflow stenosis. 2. Moderate grade stenosis of the mid right SFA and patent SFA stent. Patent right popliteal artery with single vessel runoff to the right foot. 3. Diffuse moderate disease of the left SFA with patent popliteal artery and diseased two-vessel runoff to the left foot. Moderate grade stenosis in the proximal left posterior tibial artery. 4. Open wound in the right foot with subcutaneous gas and multiple abscesses in the mid foot along the plantar surface and dorsal subcutaneous tissues. 5. Poorly defined fluid collections in the left foot and in the subcutaneous tissues of the anterior left knee which are concerning for additional abscesses. Electronically signed by: Freda Lanes, MD 04/19/2024 2:45 PM EDT          Workstation ID: [id number]     CT FOOT LEFT W CONTRAST  Result Date: 04/17/2024  HISTORY: Swelling and pain. TECHNIQUE: Helical acquisition CT images through the left foot was performed with contrast with multiplanar reformats. COMPARISON: None. FINDINGS: No visualized acute fracture or dislocation. No evidence of periosteal reaction or bony destruction to suggest osteomyelitis. There is extensive subcutaneous fat edema and stranding within the lateral soft tissues from the level of the mid foot to the forefoot. In particular, there is a prominent area of focal fluid measuring approximately 3.8 x 2.1 cm on image 20, series 5 that extends superficially to abut the skin and may represent a developing abscess. There is no definite  visualized enhancing wall.     IMPRESSION: 1. Findings are compatible with cellulitis of the lateral midfoot and forefoot with more focal 3.8 cm area of fluid at the level of the midfoot that is suspicious for an early developing abscess as there is no discrete capsule. 2. No evidence of osteomyelitis. Electronically signed by: Lillia Hug, MD 04/17/2024 11:37 AM EDT  Workstation ID: [id number]     LE ARTERIAL  Result Date: 04/16/2024                                                                                                                                    Version: 1                                                                                                                                   Study ID: [id number]                                                       [facility]                                                       [address] [place], [place]  [address]                                                           Arterial Lower Extremity Report Name: Susan [name], Susan [name] Date: 04/15/2024, 2: 27 PM MRN: [id number]  Patient Location: [facility] Age: 89 Years                                                               DOB: [birth year] (MM/DD/YYYY)                                                                               Gender: Female Performed By: Edilia Romelia Elden Sherrell, RVT                          Account #:: [id number] Ordering Physician: ANASTASIO CAIN A Reason For Study: Peripheral vascular disease.                                                     INTERPRETATION SUMMARY 1. Moderate right lower extremity arterial insufficiency at the level of the iliac-femoral region. 2. Severe left lower extremity arterial insufficiency at the level of the femoral-popliteal  region. 3. Digital pressures deferred due to patient's pain. QUALITY/PROCEDURE Arterial Physio ABI [phone]). A bilateral lower extremity arterial exam was performed. Continuous wave Doppler tracings were recorded from the common femoral, popliteal, posterior tibial and dorsalis pedis arteries bilaterally, and resting ankle brachial pressures were measured. Digital pressures deferred due to patient's pain. RIGHT LEG SEGMENTAL Brachial = 131 mmHg. Ankle Pressure (PT) = 81 mmHg. Ankle Pressure (DP) = 83 mmHg. Right ABI = 0.63. The lower extremity ankle brachial index is 0.63 on the right. The right lower extremity waveforms are monophasic in the femoral, the popliteal, the posterior tibial and the dorsalis pedis arteries. RIGHT DIGIT PPG Digit 1 flow is absent. LEFT LEG SEGMENTAL Brachial = 126 mmHg. Ankle (PT) = 59 mmHg. Ankle (DP) = 63 mmHg. Left ABI = 0.48. The lower extremity ankle brachial index is 0.48 on the left. The left lower extremity waveforms are multiphasic in the femoral, and monophasic in the popliteal, the posterior tibial and the dorsalis pedis arteries. LEFT DIGIT PPG Digit 1 flow is absent. ______________________________________________________________________________ Electronically signed by: Dr Blenda Fear, M.D                       04/16/2024, 10: 01 PM     US  RETROPERITONEAL COMPLETE  Result Date: 04/15/2024  INDICATION: Acute renal failure. COMPARISON: None. TECHNIQUE: Transabdominal grayscale and duplex sonography of the retroperitoneum performed. FINDINGS: RIGHT KIDNEY:  10.7 cm in length. (Normal range 9-13 cm) Normal renal parenchymal echogenicity. No obstruction, renal calculi or focal lesion. LEFT KIDNEY: 9.8 cm in length. (Normal range 9-13 cm) Normal renal parenchymal echogenicity.  And interpolar simple-appearing partially exophytic left renal cyst measures 2.6 x 2.6 x 2.1 cm. URINARY BLADDER: Bladder has a pre-void volume of 240.0 mL. (<50 mL WNL) Patient did not void for the exam.  Neither ureteral jet(s) identified. No urinary  bladder wall thickening or debris within the urinary bladder. (Normal wall thickness <3 mm distended, <5 mm underdistended.) Bladder contains a Foley catheter and is decompressed. PANCREAS:  Visualized pancreas is within normal limits. IVC: Patent. ABDOMINAL AORTA: Normal caliber abdominal aorta. COMMON ILIAC ARTERIES: Suboptimally visualized secondary to interposed bowel gas.     IMPRESSION: 1.  No hydronephrosis. Kidneys unremarkable sonographically with the exception of a partially exophytic simple-appearing interpolar left renal cyst. 2.  Urinary bladder unremarkable sonographically. 3.  Common iliac arteries above the visualized sonographically due to interposed bowel gas Electronically signed by: Alm Conte, MD 04/15/2024 8:34 PM EDT          Workstation ID: [id number]     CT FOOT RIGHT W CONTRAST  Result Date: 04/15/2024  Indication: Right foot infection. Evaluate for abscess. Comparison: None available Technique: Computed tomographic imaging of the right foot was performed following IV contrast administration. Coronal and sagittal reconstructions were created from the initial axial data set and submitted for interpretation. Findings: Complex gas and fluid containing abscess//phlegmon in the subcutaneous lateral hindfoot spanning approximately 4.4 cm craniocaudal by 2 x 1.5 cm transverse. This is positioned posterior to the peroneal tendons which are intact and not overtly involved. There is subtle loss of bony cortex/rarefaction at the lateral margin of the subjacent calcaneus concerning for osteomyelitis.     IMPRESSION: 1. Subcutaneous phlegmon/abscess lateral right hindfoot and 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 of the subjacent lateral margin of the calcaneus. Electronically signed by: Glade Reeve, MD 04/15/2024 2:16 PM EDT          Workstation ID: [id number]     XR FOOT RIGHT (2 VIEWS)  Result Date: 04/15/2024  Exam: 2 views of the right foot. Clinical  indications:  PAIN. RULE OUT OSTEOMYELITIS. Right foot pain and swelling. Comparison:   04/15/2024;  Findings:  No fracture.  No dislocation. No radiopaque foreign bodies. No bone erosions.     Impression:  No fracture. No bone erosions to suggest osteomyelitis. Electronically signed by: Celine Rear, MD 04/15/2024 12:52 AM EDT          Workstation ID: [id number]     XR FOOT LEFT (2 VIEWS)  Result Date: 04/15/2024  Exam: 2 views of the left foot. Clinical indications:  PAIN right foot. Swelling. Possible osteomyelitis. Comparison:  None Findings:  No fracture.  No dislocation. No bone erosions. No radiopaque foreign bodies.     Impression:  No fracture. Electronically signed by: Celine Rear, MD 04/15/2024 12:51 AM EDT          Workstation ID: [id number]     XR CHEST PORTABLE  Result Date: 04/15/2024  Exam: AP portable chest Clinical indication: SEPSIS Comparison: 04/08/2024;  Results:  Left basilar opacity. No pneumothorax. Heart normal.  Mediastinal contours are normal. No free air is seen under the hemidiaphragms.   Osseous structures intact.     IMPRESSION: Left basilar opacity. Developing pneumonia versus atelectasis. Electronically signed by: Celine Rear, MD 04/15/2024 12:41 AM EDT          Workstation ID: [id number]     VL DUP LOWER EXTREMITY VENOUS BILATERAL  Result Date: 04/09/2024  Version: 1                                                                                                                                   Study ID: [id number]                                                       [facility]                                                       [address] [place], [place]  [address]                                                      Lower Extremity Venous Duplex  Report Name: Susan [name], Susan [name] Date: 04/08/2024, 2: 17 PM MRN: [id number]                                                               Patient Location: [facility] Age: 27 Years                                                               DOB: 04-01-1951 (MM/DD/YYYY)  Gender: Female Performed By: Kylee R. Holloman, RVT                                       Account #:: [id number] Ordering Physician: [facility], ADIT B Reason For Study: Lower extremity pain and swelling                                                     INTERPRETATION SUMMARY 1. No evidence of deep vein thrombosis in the bilateral lower extremities. QUALITY/PROCEDURE Complete bilateral venous duplex performed. Quality of the study is good. Location: ER. RIGHT LEG The deep venous system of the right lower extremity was examined using duplex ultrasound. B-mode imaging demonstrated normal compressibility of the right common femoral, femoral, popliteal, posterior tibial and peroneal veins. Doppler flow signals were spontaneous and phasic at all levels. RIGHT SAPHENOUS VEINS Right great saphenous vein at the saphenofemoral junction demonstrates normal compressibility with spontaneous and phasic venous hemodynamics. LEFT LEG The deep venous system of the left lower extremity was examined using duplex ultrasound. B-mode imaging demonstrated normal compressibility of the left common femoral, femoral, popliteal, posterior tibial and peroneal veins. Doppler flow signals were spontaneous and phasic at all levels. LEFT LEG SAPHENOUS VEINS Left great saphenous vein at the saphenofemoral junction demonstrates normal compressibility with spontaneous and phasic venous hemodynamics. ______________________________________________________________________________ Electronically signed by: Dr Blenda Fear, M.D                       04/09/2024,  8: 57 AM     XR CHEST (2 VW)  Result Date: 04/08/2024  History: Shortness of breath Comparison: None Technique: PA and lateral radiographs of the chest were obtained. Two views, 2 exposures total. Findings: The lungs and pleural spaces are clear. Normal cardiomediastinal silhouette. Degenerative changes of the thoracic spine.     IMPRESSION: No acute cardiopulmonary disease. Electronically signed by: Norleen Gleason 04/08/2024 11:20 AM EDT          Workstation ID: [id number]        I have independently reviewed lab studies and imgaing as well as review of nursing notes and physican notes from the past 24 hours.    Dragon medical dictation software was used for portions of this report. Unintended errors may occur.     VIVIANE GORMAN GORE, MD  April 21, 2024    [facility]   Office Phone:[phone]  [phone]

## 2024-04-21 NOTE — Pre Sedation (Signed)
 Sedation Pre-Procedure Note    Patient Name: Susan [name]   Date of Birth:08/26/1951  Room/Bed: 5212/5212  Medical Record Number: [id number]  Date: 04/21/2024   Time: 8:46 AM       Indication:  RLE PAD with abscess    Consent: I have discussed with the patient and/or the patient representative the indication, alternatives, and the possible risks and/or complications of the planned procedure and the anesthesia methods. The patient and/or patient representative appear to understand and agree to proceed.    Vital Signs:   Vitals:    04/21/24 0815   BP: 125/77   Pulse: 73   Resp: 18   Temp: 97.5 F (36.4 C)   SpO2: 100%       Past Medical History:   has no past medical history on file.    Past Surgical History:   has no past surgical history on file.    Medications:   Scheduled Meds:    [START ON 04/22/2024] vancomycin  (VANCOCIN ) intermittent dosing (placeholder)  1 each Other Once    vancomycin   1,000 mg IntraVENous Q24H    heparin  (porcine)  5,000 Units SubCUTAneous BID    cefepime   1,000 mg IntraVENous Q12H    vancomycin  (VANCOCIN ) intermittent dosing (placeholder)  1 each Other RX Placeholder    sodium hypochlorite   Irrigation Daily    metroNIDAZOLE   500 mg IntraVENous Q8H     Continuous Infusions:   PRN Meds: albuterol , ipratropium 0.5 mg-albuterol  2.5 mg, senna, docusate sodium , ondansetron , acetaminophen , melatonin, naloxone , glucagon  (rDNA), dextrose , benzonatate , morphine , oxyCODONE -acetaminophen   Home Meds:   Prior to Admission medications    Medication Sig Start Date End Date Taking? Authorizing Provider   furosemide  (LASIX ) 40 MG tablet Take 1 tablet by mouth daily 01/22/24  Yes [provider]   carvedilol (COREG) 12.5 MG tablet Take 1 tablet by mouth 2 times daily (with meals) 01/22/24  Yes [provider]   atorvastatin  (LIPITOR ) 80 MG tablet Take 1 tablet by mouth nightly 01/30/24  Yes [provider]   GEMTESA 75 MG TABS tablet Take 1 tablet by mouth daily 01/23/24  Yes [provider]   VRAYLAR 1.5 MG capsule Take 1 capsule by mouth daily   Yes [provider]   clopidogrel  (PLAVIX ) 75 MG tablet Take 1 tablet by mouth daily   Yes [provider]   QUEtiapine (SEROQUEL) 100 MG tablet Take 1 tablet by mouth daily   Yes [provider]   pantoprazole (PROTONIX) 40 MG tablet Take 1 tablet by mouth daily   Yes [provider]   potassium chloride (KLOR-CON) 10 MEQ extended release tablet Take 1 tablet by mouth daily with food   Yes [provider]   valsartan (DIOVAN) 160 MG tablet Take 1 tablet by mouth 2 times daily   Yes [provider]   MYRBETRIQ 25 MG TB24 Take 1 tablet by mouth daily   Yes [provider]   TRELEGY ELLIPTA 200-62.5-25 MCG/ACT AEPB inhaler Inhale 1 puff into the lungs daily   Yes [provider]     Coumadin Use Last 7 Days:  no  Antiplatelet drug therapy use last 7 days: no  Other anticoagulant use last 7 days: yes - heparin   Additional Medication Information:  prior stents RLE      Pre-Sedation Documentation and Exam:   I have personally completed a history, physical exam & review of systems for this patient (see notes).  Vital signs have  been reviewed (see flow sheet for vitals).  I have reviewed the patient's history and review of systems.    Mallampati Airway Assessment:  Mallampati Class II - (soft palate, fauces & uvula are visible)    Prior History of Anesthesia Complications:   none    ASA Classification:  Class 3 - A patient with severe systemic disease that limits activity but is not incapacitating    Sedation/ Anesthesia Plan:   intravenous sedation    Medications Planned:   midazolam  (Versed ) intravenously and fentanyl  intravenously    Patient is an appropriate candidate for plan of sedation: yes    Electronically signed by Ladona CHRISTELLA Fairly, MD on 04/21/2024 at 8:46 AM

## 2024-04-21 NOTE — Plan of Care (Signed)
 Problem: Respiratory - Adult  Goal: Achieves optimal ventilation and oxygenation  04/21/2024 1317 by Dorma Nian, RN  Outcome: Progressing  04/21/2024 0107 by Genny Willam BIRCH, RN  Outcome: Progressing     Problem: Pain  Goal: Verbalizes/displays adequate comfort level or baseline comfort level  04/21/2024 1317 by Dorma Nian, RN  Outcome: Progressing  04/21/2024 0107 by Genny Willam BIRCH, RN  Outcome: Progressing     Problem: Safety - Adult  Goal: Free from fall injury  04/21/2024 1317 by Dorma Nian, RN  Outcome: Progressing  04/21/2024 0107 by Genny Willam BIRCH, RN  Outcome: Progressing

## 2024-04-21 NOTE — Progress Notes (Signed)
 PHYSICAL THERAPY    Time  PT Charge Capture  Rehab Caseload Tracker       Patient: Susan [name] (73 y.o. female)  Room: 5212/5212    Primary Diagnosis: Foot abscess [L02.619]  Right leg pain [M79.604]  Abscess of right foot [L02.611]   Procedure(s) (LRB):  RIGHT HEEL IRRIGATION AND DEBRIDEMENT WITH WOUND VAC APPLICATION (Right) 4 Days Post-Op  Date of Admission: 04/14/2024   Insurance: Payor: BCBS MEDICARE / Plan: ANTHEM FULL DUAL ADVANTAGE 2 / Product Type: *No Product type* /      Date: 04/21/2024  Time: 1126    OBJECTIVE:     Orders, labs, and chart reviewed on Susan [name]. Discussed with patient's nurse.    Patient was not seen for skilled physical therapy treatment, secondary to pt scheduled for surgery soon.    PLAN:     Our services will follow up as patient's condition and/or schedule permit.    [place], PT  April 21, 2024

## 2024-04-21 NOTE — Progress Notes (Signed)
 TRANSFER - OUT REPORT:    Verbal report given to [facility] RN on [name]  being transferred to cath holding 9 for routine post-op       Report consisted of patient's Situation, Background, Assessment and   Recommendations(SBAR).     Information from the following report(s) Nurse Handoff Report was reviewed with the receiving nurse.           Lines:   Peripheral IV 04/18/24 Left Cephalic (Active)   Site Assessment Clean, dry & intact 04/21/24 0900   Line Status Flushed;Sluggish blood return;Infusing;Capped 04/21/24 0900   Line Care Connections checked and tightened 04/21/24 0900   Phlebitis Assessment No symptoms 04/21/24 0900   Infiltration Assessment 0 04/21/24 0900   Alcohol Cap Used Yes 04/21/24 0900   Dressing Status Clean, dry & intact 04/21/24 0900   Dressing Type Transparent 04/21/24 0900        Opportunity for questions and clarification was provided.      Patient transported with:  Registered Nurse

## 2024-04-21 NOTE — Progress Notes (Signed)
 Vascular    Change in plans, time available for angio this afternoon.  Discussed with patient who understands and has signed permission.  Discussed with daughter who is also in agreement.    Thanks  Joseline Mccampbell M. Maree, MD, FACS  Office:  [phone]  Pager:  [phone]

## 2024-04-21 NOTE — Plan of Care (Signed)
 Problem: Respiratory - Adult  Goal: Achieves optimal ventilation and oxygenation  Outcome: Progressing     Problem: Pain  Goal: Verbalizes/displays adequate comfort level or baseline comfort level  Outcome: Progressing     Problem: Safety - Adult  Goal: Free from fall injury  Outcome: Progressing     Problem: Skin/Tissue Integrity  Goal: Skin integrity remains intact  Description: 1.  Monitor for areas of redness and/or skin breakdown  2.  Assess vascular access sites hourly  3.  Every 4-6 hours minimum:  Change oxygen saturation probe site  4.  Every 4-6 hours:  If on nasal continuous positive airway pressure, respiratory therapy assess nares and determine need for appliance change or resting period  Outcome: Progressing

## 2024-04-21 NOTE — Progress Notes (Signed)
 TRANSFER - OUT REPORT:    Verbal report given to Marolyn, RN on Susan [name]  being transferred to 5212 for routine post-op       Report consisted of patient's Situation, Background, Assessment and   Recommendations(SBAR).     Information from the following report(s) Nurse Handoff Report, Adult Overview, and Surgery Report was reviewed with the receiving nurse.           Lines:   Peripheral IV 04/18/24 Left Cephalic (Active)   Site Assessment Clean, dry & intact 04/21/24 0900   Line Status Flushed;Sluggish blood return;Infusing;Capped 04/21/24 0900   Line Care Connections checked and tightened 04/21/24 0900   Phlebitis Assessment No symptoms 04/21/24 0900   Infiltration Assessment 0 04/21/24 0900   Alcohol Cap Used Yes 04/21/24 0900   Dressing Status Clean, dry & intact 04/21/24 0900   Dressing Type Transparent 04/21/24 0900        Opportunity for questions and clarification was provided.      Patient transported with:  [employer]

## 2024-04-22 LAB — BASIC METABOLIC PANEL
Anion Gap: 6 mmol/L (ref 5–15)
BUN: 15 mg/dL (ref 9–23)
CO2: 28 meq/L (ref 20–31)
Calcium: 9.4 mg/dL (ref 8.7–10.4)
Chloride: 104 meq/L (ref 98–107)
Creatinine: 0.91 mg/dL (ref 0.55–1.02)
GFR African American: >60
GFR Non-African American: >60
Glucose: 75 mg/dL (ref 74–106)
Potassium: 5.2 meq/L — ABNORMAL HIGH (ref 3.5–5.1)
Sodium: 138 meq/L (ref 136–145)

## 2024-04-22 LAB — CULTURE, ANAEROBIC

## 2024-04-22 MED ORDER — ATORVASTATIN CALCIUM 40 MG PO TABS
40 | Freq: Every evening | ORAL | Status: DC
Start: 2024-04-22 — End: 2024-05-02
  Administered 2024-04-23 – 2024-05-02 (×10): 40 mg via ORAL

## 2024-04-22 MED FILL — CEFEPIME HCL 1 G IJ SOLR: 1 g | INTRAMUSCULAR | Qty: 1000 | Fill #0

## 2024-04-22 MED FILL — HEPARIN SODIUM (PORCINE) 5000 UNIT/ML IJ SOLN: 5000 [IU]/mL | INTRAMUSCULAR | Qty: 1

## 2024-04-22 MED FILL — METRONIDAZOLE 500 MG/100ML IV SOLN: 500 MG/100ML | INTRAVENOUS | Qty: 100

## 2024-04-22 MED FILL — VANCOMYCIN (VANCOCIN) 1000 MG IN SODIUM CHLORIDE 0.9% 250 ML IVPB: Qty: 250

## 2024-04-22 MED FILL — CEFEPIME HCL 1 G IJ SOLR: 1 g | INTRAMUSCULAR | Qty: 1000

## 2024-04-22 MED FILL — ASPIRIN LOW DOSE 81 MG PO TBEC: 81 mg | ORAL | Qty: 1

## 2024-04-22 MED FILL — HEPARIN SODIUM (PORCINE) 5000 UNIT/ML IJ SOLN: 5000 [IU]/mL | INTRAMUSCULAR | Qty: 1 | Fill #0

## 2024-04-22 MED FILL — OXYCODONE-ACETAMINOPHEN 7.5-325 MG PO TABS: 7.5-325 mg | ORAL | Qty: 1

## 2024-04-22 MED FILL — CLOPIDOGREL BISULFATE 75 MG PO TABS: 75 mg | ORAL | Qty: 1

## 2024-04-22 MED FILL — ATORVASTATIN CALCIUM 40 MG PO TABS: 40 mg | ORAL | Qty: 1

## 2024-04-22 NOTE — Progress Notes (Signed)
 Vancomycin  Dosing Per Pharmacy Protocol    Vancomycin  level: 18.9    Vancomycin  continue as ordered, will need to adjust depending on creatinine trend    Next trough level: to be determined, currently therapeutic    Pharmacy will continue to follow.    Thanks,    Suzen Hails, PharmD

## 2024-04-22 NOTE — Care Coordination-Inpatient (Signed)
 [facility]   Care Manager Update    Patient Name: Susan [name]     MRN: [id number]   Age: 73 y.o.   Sex: female                   Primary Care Physician To, Garnette RAMAN, MD  Unit/Room: 5212/5212  Admit Date: 04/14/2024   Length of stay: 7  Admitting Diagnosis: Foot abscess [L02.619]  Right leg pain [M79.604]  Abscess of right foot [L02.611]   Primary Payer ANTHEM FULL DUAL ADVANTAGE 2  Current Attending Physician: Josepha Planas, DO      Current Therapy and/or Physician Recommendation for Discharge Plan:       SNF      Patient/Authorized Representative Freedom of Choice D/C Plan:       SNF      Managed plan requiring authorization for Sh[facility]ise Blough-acute rehab needs:      yes    Primary Payer ANTHEM FULL DUAL ADVANTAGE 2      DME anticipated or ordered (including wound vacs):      TBD  - If Yes, list DME below    Currently has a wound vac on .       Infusion services anticipated or confirmed:      TBD   - If Yes, list need below    Path suggestive of acute and chronic osteomyelitis     Positive Social Determinants of Health screening or indigent needs identified:    no   - If Yes, list need(s) and intervents below: i.e. medication, DME, housing assistance; [facility] referral, [facility] referral      Discharge Plan Updates April 22, 2024      Anticipate long-term antibiotics but final plan based on further podiatry intervention.S/P right heel I&D with wound vac application ,  Will need PICC line. PT following , await OT eval .            Signed:   Karna Scull , Case Manager  April 22, 2024  3:20 PM  Department Phone: [phone]

## 2024-04-22 NOTE — Progress Notes (Signed)
 PHYSICAL THERAPY TREATMENT:   Time  PT Charge Capture  Rehab Caseload Tracker  [place]        Patient: Susan [name] (73 y.o. female)  Room: 5212/5212    Primary Diagnosis: Foot abscess [L02.619]  Right leg pain [M79.604]  Abscess of right foot [L02.611]  Procedure(s) (LRB):  RIGHT HEEL IRRIGATION AND DEBRIDEMENT WITH WOUND VAC APPLICATION (Right) 5 Days Post-Op   Length of Stay:  7 day(s)   Insurance: Payor: BCBS MEDICARE / Plan: ANTHEM FULL DUAL ADVANTAGE 2 / Product Type: *No Product type* /      Date: 04/22/2024  Time In:  first attempt:  1320 - pt needs to do dressing change and give pain meds  Time In: 1420       Time Out: 1445   Total Minutes: 25    Isolation:  No active isolations       MDRO: No active infections    Precautions: falls, pressure injury risk   Ordered weight bearing status: no order, but assume non-weight bearing RLE    ASSESSMENT:      Based on the objective data described below, the patient presents with  - poor tolerance to activity  - requires increased time, rest breaks and repeated attempts with functional tasks  - pacing required  - functionally very weak  - deconditioning, generalized strength deficit  - decreased endurance  - active participation in therapeutic leg exercises  - activity is limited by pain  - complaint of BLE pain  - complaint of fatigue with activity  - requires oxygen  - unable to ambulate  - putting forth good effort  - will continue to benefit from skilled physical  therapy services.   Patient is below baseline functional mobility and will benefit from skilled PT intervention in the acute care setting to address the abovementioned impairments.         Recommendations:  Recommend continued physical therapy during acute stay. Physical Therapy and Occupational Therapy. Recommend out of bed activity to counteract ill effects of bedrest, with assistance from staff as needed.  Discharge Recommendations: Skilled nursing facility (SNF): patient will benefit from  further therapy at rehab facility to increase strength and endurance to return to prior level of function.  Further Equipment Recommendations for Discharge: DME to be determined at time of discharge from SNF.         PLAN:     Patient will be followed by physical therapy to address goals per initial plan of care.    COMMUNICATION/EDUCATION:     Education: activity pacing, HEP, positioning, precautions, PT plan of care, reviewed previously taught information, all questions answered.    Education provided to: patient  Opportunity for questions and clarification was provided.    Readiness to learn indicated by: verbalized understanding, successful return demonstration, and appropriate questions asked    Barriers to learning/limitations: pain and apprehension due to pain    Comprehension: Patient communicated comprehension        SUBJECTIVE:      Patient agrees to bed level rx, LLE ex only due to severe pain  Patient reports 7/10 pain before treatment and 8/10 pain at conclusion of treatment.  Pain Location: BLEs      OBJECTIVE DATA SUMMARY:      Orders, labs, and chart reviewed on Susan [name]. Communicated with patient's nurse (patient ok to be seen by PT).     PATIENT FOUND:     Semi reclined in bed. BLE elevated. (-)  bed alarm. (+) IV. (+) pure wick. (+) O2 nasal cannula. (+) family present.    COGNITIVE STATUS:     Mental Status:  Oriented x3   Communication:  normal   Follows commands:  follows two step commands/direction   General cognition:  grossly intact   Safety/Judgement:  appropriate awareness of environment and need for assistance as well as awareness of precautions     THERAPEUTIC ACTIVITIES; FUNCTIONAL MOBILITY AND BALANCE STATUS:     Patient received/participated in Therapeutic Exercises (25 minutes)    Bed mobility:  Not tested due to c/o severe pain with movement R>LLE    Transfers:  Unable    Gait Training:  unable        Balance:   Not addressed this visit        THERAPEUTIC EXERCISES:      10  reps each, LLE only: with assistance  ankle pumps, heel slides, hip ab/duction, short arc quads (SAQ), glute sets           ACTIVITY TOLERANCE:     - poor tolerance to sustained activity  - activity is limited by pain    FINAL LOCATION:     Positioned in bed, all needs within reach. Patient agrees to call for assistance. As found. Positioned with pillows for comfort. Heels floating. (+) bed alarm. Nurse notified.    Thank you for this referral.  [place], PT

## 2024-04-22 NOTE — Progress Notes (Signed)
 Brief Podiatry update     Patient is s/p i&d and Angio   Will plan for further debridement with possible flap with Dr Anastasio along with drainage of abscess on left foot   Will update with OR plans pending availability   Thank you

## 2024-04-22 NOTE — Progress Notes (Signed)
 Vascular f/u    BP (!) 124/57   Pulse 71   Temp 98.2 F (36.8 C) (Temporal)   Resp 17   Ht 1.702 m (5' 7)   Wt 81.6 kg (179 lb 14.3 oz)   SpO2 100%   BMI 28.18 kg/m     Alert, feels OK but c/o 'pain all over' and 'don't touch me'  Left groin access site OK  Right pop pulse strong  Good peroneal signal (only runoff vessel)    Wound vac removed - tissues clean, red, good bleeding  Packed and discussed with nurses about replacing VAC after Dr Anastasio has a chance to see wound  Left foot fluctuant area that feels like fluid collection, not red or hot, ?? Abscess on CT  Probably should I/D vs aspirate - will discuss with Dr Anastasio    LDL 123 back in March - given PAD should be on statin.  Will start today    Following closely  D/W Drs Meade and Josepha Ladona HERO. Maree, MD, FACS  Office:  [phone]  Pager:  [phone]

## 2024-04-22 NOTE — Progress Notes (Signed)
 Chaplain Services  Follow Up Visit    Start Time: 0957  End Time: 1001    Volunteer Chaplain conducted a Follow Up consultation and Spiritual Assessment for Susan [name], who is a 73 y.o.,female.      The Volunteer Chaplain provided the following Interventions:  Continued the relationship of care and support.   Patient is Asleep.  No Family Present.   Offered prayer and assurance of continued prayer remotely on patients behalf.   Chart reviewed.    The following outcomes were achieved:  Patient expressed gratitude for chaplain's visit.    Assessment:  There are no further spiritual or religious issues which require Spiritual Care Services interventions at this time.     Plan:  Chaplains will continue to follow and will provide pastoral care on an as needed/requested basis.  Volunteer Chaplain recommends bedside caregivers page Duty Chaplain if patient shows signs of acute spiritual or emotional distress.   Chaplain charted for [employer].    Ozell Getting   [facility]   Spiritual Care  [phone]

## 2024-04-22 NOTE — Progress Notes (Signed)
 INFECTIOUS DISEASE FOLLOW UP NOTE     Date of admission: 04/14/2024     Date of consult: April 15, 2024        ABX:      Current abx Prior abx    Cefepime /Vanco 7/14-8  Flagyl  7/15-7        ASSESSMENT:       Right foot abscess  -Unclear cause of swelling and pain that has been present for several months  -Has been evaluated by orthopedic  -Patient seen by [facility] specialist Dr. Curtistine Emperor with last visit 03/11/2024 for right contusion of dorsum of foot, lower extremity edema and right foot pronation deformity  -03/08/2024 MRI right hindfoot no fracture.  Mild multifocal osseous edema?  Altered gait.  And mechanical stress-related changes.  Mild intrinsic foot muscular atrophic and intramuscular edema.  Subcu edema along the dorsum of the foot  -xray no fracture  -S/p I&D by ED pending cultures   - Patient seen by podiatrist Dr. Roberts   - Awaiting CT foot    Left foot swelling  -Xray no fracture  -Low threshold for   Leucocytosis POA   AKI/CKD  -Abnormal creatinine few weeks ago on routine blood work at [employer]   PAD  -Patient has been following with vascular surgeon and last seen in office on 04/08/2024  -12/2023 ABI at [facility] with severe left lower extremity arterial insufficiency at rest and right first digital disease with known proximal arterial insufficiency  -12/2023 s/p right lower extremity angiogram showed single-vessel peroneal runoff with good branches   Ch hypoxic resp failure   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  -Has been following with neurosurgery at [facility] artery aneurysm  -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 and following with [facility] neurosurgery with plan for continued management   Ax- PCN -as a child   Co-morb- COPD, schizoaffective disorder, HTN,  depression,             RECOMMENDATIONS:      Complicated 73 year old female with significant comorbidities coming with bilateral feet swelling, right worse than left    -Patient afebrile, stable vitals, on nasal cannula  -BUN/Cr 15/0.91, Last WBC 13, Hb 8.3, PLT 413, lactic acid 0.8, Pro-Cal 0.19, last Vanco trough 19.3  -Micro 7/17 wound right foot Gram stain negative, culture IP, ANA NTD, right heel bone Gram stain negative, culture negative, ANA NTD, 7/15 urine less than 10k, superficial wound culture negative, 7/14 blood culture x 2 negative  -Imaging nothing new       Assessment     Right foot abscess  - Coming with swelling, pain of right foot   - Understand I&D in ED by provider and cultures negative so far  - CT concerning for abscess on right foot  - 7/17 s/p OR with I&D and placement of wound VAC -or cultures negative so far   - 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  Abscess and CTA 7/19 poorly defined collections foot and subcu left knee  -7/19 CTA with poorly defined fluid collections in the left foot  -  Await further input from podiatry     PAD  -Previous history of angio but recent ABI with monophasic waveforms in femoral and plan for angio per vascular  -Vascular study 7/16 moderate right iliac-femoral, severe left arterial insufficiency at femoropopliteal region  - Complains of increasing BLE pain today question from this versus other, denies history of gout  - Vascular following and s/p angio 7/21     Bilateral leg edema  - Recent PVL 7/8 was negative for DVT but low threshold to repeat     Leukocytosis likely reactive and monitor  - Improved     AKI?  CKD  - Avoid  nephrotoxic medications and dose for current creatinine clearance  - Renal ultrasound without any stone or obstructionOther details as above     Other details as above     Rec:  - Continue IV Vanco, cefepime  and Flagyl  for now.  Anticipate long-term antibiotics but final plan based on further podiatry intervention  - Interestingly or cultures negative from 7/17 but path positive for acute and chronic osteo  - Based on recent CT, still with bilateral feet abscesses and await further input from podiatry regarding subsequent drainage  -Local wound care per others  -Await further input from podiatry regarding left foot abscess  - Vascular following  - Continue to monitor left knee with question left knee fluid collection on CTA  - Keep legs elevated on pillows  - Monitor serial labs  - Monitor for any ADE  - Remains at risk for worsening   -Discussed with Dr. Olam Campi                 MICROBIOLOGY:      7/14 Blcx x2 IP              Wd cx IP  7/15 Ucx IP  7/17 anaerobic x 2 IP wound NOS, NGTD surgical wound NGTD right heel     LINES AND CATHETERS:      PIV     SUMMARY:      Susan [name] is a 73 year old African-American female with past medical history of COPD, schizoaffective disorder, hypertension, depression, PAD, peripheral neuropathy was admitted on 7/14 with worsening leg pain.     Patient is not a good historian and says lives with her daughter.  She has been almost bedbound for quite some time. Pt has been following with [facility] for basilar artery aneurysm and conservative Mx.  Patient also developed right leg swelling for which she was referred to Ortho and eventually podiatry.  It was not thought to be the part of her back leading to right leg swelling and neuropathy.  Patient also following with vascular and had procedure done in March with good runoff and did not think was concern for pain and swelling.  Patient last seen by podiatry 03/11/2024 and plan for conservative management.      She was seen in ED on 8 July  with leg swelling.  Workup showed negative PVL for DVT and chest x-ray without any significant finding.  She was noted with pitting edema and was given some Lasix  and asked to follow-up with PCP for adjustment in medications.  Patient returned 7/14 with worsening swelling of the legs, right worse than left.  Workup showed stable vitals, afebrile state.  WBC elevated at 19, Hb 9.8, PLT 354, BUN 34, CR 1.43, AST 17, ALT 14.  Blood cultures were obtained.  Patient had bilateral feet x-ray which showed no fracture or  concern for osteo.  Chest x-ray was suggestive of left basilar opacity with developing pneumonia versus atelectasis.  Patient had I&D in the ED.  Podiatry was consulted and CT was ordered.  Wound culture in progress.  Patient on broad-spectrum antibiotic with cefepime  and Vanco.  ID was asked for further evaluation management.       SUBJECTIVE :     Interval notes reviewed.  Patient afebrile.  Remains with pain in her bilateral feet.  Wound VAC was removed by surgeon earlier but patient refused leg exams today.    OBJECTIVE     BP 128/76   Pulse 80   Temp 97.2 F (36.2 C) (Temporal)   Resp 17   Ht 1.702 m (5' 7)   Wt 81.6 kg (179 lb 14.3 oz)   SpO2 100%   BMI 28.18 kg/m     Temp (24hrs), Avg:98.2 F (36.8 C), Min:97.2 F (36.2 C), Max:99.3 F (37.4 C)    General: Fairly developed and nourished 73 y.o. year-old, female, chronically sick  HEENT: Normocephalic, anicteric sclerae, Pupils equal, round reactive to light, no oropharyngeal lesions. No sinus tenderness.  Neck: Supple, no lymphadenopathy, masses or thyromegaly  Chest: Symmetrical expansion  Lungs: Clear to auscultation bilaterally, no dullness  Heart: Regular rhythm, no murmur, no rub or gallop, No JVD  Abdomen: Soft, non-tender,non distended, no organomegaly, BS+  Musculoskeletal: Refused exam of lower extremities today   CNS: AAOx3.  Follows simple commands.  SKIN: No obvious skin lesion or rash. Dry, warm, intact         MEDICATIONS:      Current Facility-Administered Medications   Medication Dose Route Frequency Provider Last Rate Last Admin    aspirin  EC tablet 81 mg  81 mg Oral Daily Maree Ladona HERO, MD   81 mg at 04/21/24 1817    clopidogrel  (PLAVIX ) tablet 75 mg  75 mg Oral Daily Maree Ladona HERO, MD        albuterol  (PROVENTIL ) (2.5 MG/3ML) 0.083% nebulizer solution 2.5 mg  2.5 mg Nebulization Q4H PRN Sahibzada, Ayaz, MD        Vancomycin  Trough Due  1 each Other Once Sahibzada, Ayaz, MD        ipratropium 0.5 mg-albuterol  2.5 mg (DUONEB ) nebulizer solution 1 Dose  1 Dose Inhalation Q4H PRN Sahibzada, Ayaz, MD        vancomycin  (VANCOCIN ) 1000 mg in sodium chloride  0.9% 250 mL IVPB  1,000 mg IntraVENous Q24H Matriano, John Paul H, MD   Stopped at 04/22/24 0356    senna (SENOKOT) tablet 8.6 mg  1 tablet Oral Daily PRN Matriano, Norleen Deward DEL, MD        docusate sodium  (COLACE) capsule 100 mg  100 mg Oral BID PRN Matriano, Norleen Deward DEL, MD        ondansetron  (ZOFRAN ) injection 4 mg  4 mg IntraVENous Q6H PRN Matriano, John Paul H, MD   4 mg at 04/21/24 1544    acetaminophen  (TYLENOL ) tablet 650 mg  650 mg Oral Q6H PRN Matriano, John Paul H, MD   650 mg at 04/18/24 1054    melatonin tablet 3 mg  3 mg Oral Nightly PRN Matriano, Norleen Deward DEL, MD        naloxone  (NARCAN ) injection 0.4 mg  0.4 mg IntraVENous PRN Matriano, Norleen Deward DEL, MD        glucagon  injection 1 mg  1 mg IntraMUSCular PRN Matriano, Norleen Deward DEL, MD  dextrose  50 % IV solution  20-30 mL IntraVENous PRN Murrell Norleen Deward VEAR, MD        heparin  (porcine) injection 5,000 Units  5,000 Units SubCUTAneous BID Sahibzada, Ayaz, MD   5,000 Units at 04/21/24 2145    benzonatate  (TESSALON ) capsule 100 mg  100 mg Oral TID PRN Murrell Norleen Deward VEAR, MD        cefepime  (MAXIPIME ) 1,000 mg in sodium chloride  0.9 % 50 mL IVPB (mini-bag)  1,000 mg IntraVENous Q12H Murrell Norleen Deward VEAR, MD   Stopped at 04/22/24 0406    *Pharmacy to Dose Vancomycin   1 each Other RX Placeholder Matriano, Norleen Deward VEAR,  MD        morphine  (PF) injection 1 mg  1 mg IntraVENous Q3H PRN Murrell Norleen Deward VEAR, MD   1 mg at 04/21/24 1640    sodium hypochlorite (DAKINS) 0.125 % external solution   Irrigation Daily Anastasio Adine LABOR, DPM        metroNIDAZOLE  (FLAGYL ) 500 mg in 0.9% NaCl 100 mL IVPB premix  500 mg IntraVENous Q8H Meade Viviane RAMAN, MD   Stopped at 04/22/24 0252    oxyCODONE -acetaminophen  (PERCOCET ) 7.5-325 MG per tablet 1 tablet  1 tablet Oral Q4H PRN Josepha Planas, DO   1 tablet at 04/22/24 0012         Labs: Results:   Chemistry Recent Labs     04/20/24  0438 04/22/24  0510   NA 140 138   K 4.7 5.2*   CL 106 104   CO2 27 28   BUN 19 15      CBC w/Diff Recent Labs     04/20/24  0438 04/21/24  0615   WBC 12.9* 13.0*   RBC 2.60* 2.87*   HGB 7.7* 8.3*   HCT 25.2* 28.1*   PLT 358 413          RADIOLOGY :        Imaging  IR ANGIOGRAM EXTREMITY RIGHT  Result Date: 04/21/2024  Radiology exam is complete. No Radiologist dictation. Please follow up with ordering provider.     CTA ABDOMINAL AORTA W BILAT RUNOFF W WO CONTRAST  Result Date: 04/19/2024  HISTORY: peripheral artery disease Technique: Noncontrast CT imaging is acquired through the chest for bolus tracking purposes. Enhanced CT imaging through the abdomen, pelvis, and lower extremities is performed during arterial phase of IV contrast menstruation as part of a CTA runoff. Angiographic reconstructions are performed formed at a separate workstation. The patient received an uneventful IV bolus of 100 mL of Omnipaque 350 nonionic contrast as part of the study. COMPARISON STUDY: none FINDINGS: Vascular structures and lower extremities: Abdominal aorta is normal in caliber and enhancement. Celiac, SMA, and renal arteries are moderately calcified yet patent with no high-grade stenosis. IMA is patent. Mild narrowing of the left common iliac artery. The right common iliac and external iliac arteries are patent. Hypogastric arteries are patent. On the right, common femoral and profunda  are patent. Diffuse mild disease of the right SFA with moderate grade narrowing in the mid SFA just proximal to the patent stent. The right popliteal artery is patent. Single-vessel runoff to the right foot through the peroneal artery. There is gradual diminished enhancement in the anterior and posterior tibial arteries. The ankle. Ulcerations are present in the right foot with subcutaneous gas and a vacuum. 2 cm abscess in the plantar subcutaneous tissues with an air-fluid level noted. Additional 2.4 cm abscess in the dorsal subcutaneous  tissues of the mid foot noted with an air-fluid level. On the left, common femoral artery is mildly narrowed secondary to plaque and the profunda is patent. Diffuse moderate disease of the left SFA with partially calcified plaque. Popliteal artery is patent. Moderate grade stenosis at the proximal posterior tibial artery. Two-vessel runoff via the posterior tibial and peroneal arteries. Diminished enhancement noted in the distal anterior tibial artery. Along the lateral aspect of the midfoot there is a 2.5 x 4.6 cm area of low density which may represent a developing abscess or phlegmon. There is also a region of edema in the anterior left lower leg at the level of the ankle. At the level of the knee, there is a irregular shaped fluid collection in the anterolateral knee just lateral to the tibial tuberosity which measures approximately 4.2 cm in diameter. Bibasilar subsegmental atelectasis. Heart size is borderline enlarged. The liver has a slight nodular contour. The gallbladder is present and contains excreted contrast. The spleen, pancreas, and adrenal glands are within normal notes. 2.6 cm left renal cyst. Kidneys are otherwise normal in morphology with no hydronephrosis. Appendix and small bowel loops are unremarkable. Diverticulosis is noted throughout the colon. No free intraperitoneal fluid or air. Uterus contains a coarsely calcified leiomyoma. No adnexal or pelvic mass.  Urinary bladder is unremarkable. Osseous structures the pelvis and lumbar spine are intact. No compression deformities.     IMPRESSION: 1. No aortic inflow stenosis. 2. Moderate grade stenosis of the mid right SFA and patent SFA stent. Patent right popliteal artery with single vessel runoff to the right foot. 3. Diffuse moderate disease of the left SFA with patent popliteal artery and diseased two-vessel runoff to the left foot. Moderate grade stenosis in the proximal left posterior tibial artery. 4. Open wound in the right foot with subcutaneous gas and multiple abscesses in the mid foot along the plantar surface and dorsal subcutaneous tissues. 5. Poorly defined fluid collections in the left foot and in the subcutaneous tissues of the anterior left knee which are concerning for additional abscesses. Electronically signed by: Freda Lanes, MD 04/19/2024 2:45 PM EDT          Workstation ID: [id number]     CT FOOT LEFT W CONTRAST  Result Date: 04/17/2024  HISTORY: Swelling and pain. TECHNIQUE: Helical acquisition CT images through the left foot was performed with contrast with multiplanar reformats. COMPARISON: None. FINDINGS: No visualized acute fracture or dislocation. No evidence of periosteal reaction or bony destruction to suggest osteomyelitis. There is extensive subcutaneous fat edema and stranding within the lateral soft tissues from the level of the mid foot to the forefoot. In particular, there is a prominent area of focal fluid measuring approximately 3.8 x 2.1 cm on image 20, series 5 that extends superficially to abut the skin and may represent a developing abscess. There is no definite visualized enhancing wall.     IMPRESSION: 1. Findings are compatible with cellulitis of the lateral midfoot and forefoot with more focal 3.8 cm area of fluid at the level of the midfoot that is suspicious for an early developing abscess as there is no discrete capsule. 2. No evidence of osteomyelitis. Electronically  signed by: Lillia Hug, MD 04/17/2024 11:37 AM EDT          Workstation ID: [id number]     LE ARTERIAL  Result Date: 04/16/2024  Version: 1                                                                                                                                   Study ID: [id number]                                                       [facility]                                                       [address] [place], [place]  [address]                                                           Arterial Lower Extremity Report Name: STEPHANEY, STEVEN Date: 04/15/2024, 2: 52 PM MRN: [id number]                                                               Patient Location: [facility] Age: 66 Years                                                               DOB: 05-Sep-1951 (MM/DD/YYYY)  Gender: Female Performed By: Edilia Romelia Elden Sherrell, RVT                          Account #:: [id number] Ordering Physician: ANASTASIO CAIN A Reason For Study: Peripheral vascular disease.                                                     INTERPRETATION SUMMARY 1. Moderate right lower extremity arterial insufficiency at the level of the iliac-femoral region. 2. Severe left lower extremity arterial insufficiency at the level of the femoral-popliteal region. 3. Digital pressures deferred due to patient's pain. QUALITY/PROCEDURE Arterial Physio ABI [phone]). A bilateral lower extremity arterial exam was performed. Continuous wave Doppler tracings were recorded from the common femoral, popliteal, posterior tibial and dorsalis pedis arteries bilaterally, and resting ankle  brachial pressures were measured. Digital pressures deferred due to patient's pain. RIGHT LEG SEGMENTAL Brachial = 131 mmHg. Ankle Pressure (PT) = 81 mmHg. Ankle Pressure (DP) = 83 mmHg. Right ABI = 0.63. The lower extremity ankle brachial index is 0.63 on the right. The right lower extremity waveforms are monophasic in the femoral, the popliteal, the posterior tibial and the dorsalis pedis arteries. RIGHT DIGIT PPG Digit 1 flow is absent. LEFT LEG SEGMENTAL Brachial = 126 mmHg. Ankle (PT) = 59 mmHg. Ankle (DP) = 63 mmHg. Left ABI = 0.48. The lower extremity ankle brachial index is 0.48 on the left. The left lower extremity waveforms are multiphasic in the femoral, and monophasic in the popliteal, the posterior tibial and the dorsalis pedis arteries. LEFT DIGIT PPG Digit 1 flow is absent. ______________________________________________________________________________ Electronically signed by: Dr Blenda Fear, M.D                       04/16/2024, 10: 01 PM     US  RETROPERITONEAL COMPLETE  Result Date: 04/15/2024  INDICATION: Acute renal failure. COMPARISON: None. TECHNIQUE: Transabdominal grayscale and duplex sonography of the retroperitoneum performed. FINDINGS: RIGHT KIDNEY:  10.7 cm in length. (Normal range 9-13 cm) Normal renal parenchymal echogenicity. No obstruction, renal calculi or focal lesion. LEFT KIDNEY: 9.8 cm in length. (Normal range 9-13 cm) Normal renal parenchymal echogenicity.  And interpolar simple-appearing partially exophytic left renal cyst measures 2.6 x 2.6 x 2.1 cm. URINARY BLADDER: Bladder has a pre-void volume of 240.0 mL. (<50 mL WNL) Patient did not void for the exam. Neither ureteral jet(s) identified. No urinary bladder wall thickening or debris within the urinary bladder. (Normal wall thickness <3 mm distended, <5 mm underdistended.) Bladder contains a Foley catheter and is decompressed. PANCREAS:  Visualized pancreas is within normal limits. IVC: Patent. ABDOMINAL AORTA: Normal caliber  abdominal aorta. COMMON ILIAC ARTERIES: Suboptimally visualized secondary to interposed bowel gas.     IMPRESSION: 1.  No hydronephrosis. Kidneys unremarkable sonographically with the exception of a partially exophytic simple-appearing interpolar left renal cyst. 2.  Urinary bladder unremarkable sonographically. 3.  Common iliac arteries above the visualized sonographically due to interposed bowel gas Electronically signed by: Alm Conte, MD 04/15/2024 8:34 PM EDT          Workstation ID: [id number]     CT FOOT RIGHT W CONTRAST  Result Date: 04/15/2024  Indication: Right foot infection. Evaluate for abscess. Comparison: None available Technique: Computed tomographic imaging of the right  foot was performed following IV contrast administration. Coronal and sagittal reconstructions were created from the initial axial data set and submitted for interpretation. Findings: Complex gas and fluid containing abscess//phlegmon in the subcutaneous lateral hindfoot spanning approximately 4.4 cm craniocaudal by 2 x 1.5 cm transverse. This is positioned posterior to the peroneal tendons which are intact and not overtly involved. There is subtle loss of bony cortex/rarefaction at the lateral margin of the subjacent calcaneus concerning for osteomyelitis.     IMPRESSION: 1. Subcutaneous phlegmon/abscess lateral right hindfoot and 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 of the subjacent lateral margin of the calcaneus. Electronically signed by: Glade Reeve, MD 04/15/2024 2:16 PM EDT          Workstation ID: [id number]     XR FOOT RIGHT (2 VIEWS)  Result Date: 04/15/2024  Exam: 2 views of the right foot. Clinical indications:  PAIN. RULE OUT OSTEOMYELITIS. Right foot pain and swelling. Comparison:   04/15/2024;  Findings:  No fracture.  No dislocation. No radiopaque foreign bodies. No bone erosions.     Impression:  No fracture. No bone erosions to suggest osteomyelitis. Electronically signed by: Celine Rear, MD 04/15/2024 12:52 AM EDT           Workstation ID: [id number]     XR FOOT LEFT (2 VIEWS)  Result Date: 04/15/2024  Exam: 2 views of the left foot. Clinical indications:  PAIN right foot. Swelling. Possible osteomyelitis. Comparison:  None Findings:  No fracture.  No dislocation. No bone erosions. No radiopaque foreign bodies.     Impression:  No fracture. Electronically signed by: Celine Rear, MD 04/15/2024 12:51 AM EDT          Workstation ID: [id number]     XR CHEST PORTABLE  Result Date: 04/15/2024  Exam: AP portable chest Clinical indication: SEPSIS Comparison: 04/08/2024;  Results:  Left basilar opacity. No pneumothorax. Heart normal.  Mediastinal contours are normal. No free air is seen under the hemidiaphragms.   Osseous structures intact.     IMPRESSION: Left basilar opacity. Developing pneumonia versus atelectasis. Electronically signed by: Celine Rear, MD 04/15/2024 12:41 AM EDT          Workstation ID: [id number]     VL DUP LOWER EXTREMITY VENOUS BILATERAL  Result Date: 04/09/2024                                                                                                                                    Version: 1  Study ID: [id number]                                                       [facility]                                                       [address] [place], [place]  [address]                                                      Lower Extremity Venous Duplex Report Name: MALAIA, BUCHTA Date: 04/08/2024, 2: 16 PM MRN: [id number]                                                               Patient Location: [facility] Age: 12 Years                                                               DOB: March 12, 1951 (MM/DD/YYYY)                                                                                Gender: Female Performed By: Kylee R. Holloman, RVT                                       Account #:: [id number] Ordering Physician: [facility], [facility] Reason For Study: Lower extremity pain and swelling  INTERPRETATION SUMMARY 1. No evidence of deep vein thrombosis in the bilateral lower extremities. QUALITY/PROCEDURE Complete bilateral venous duplex performed. Quality of the study is good. Location: ER. RIGHT LEG The deep venous system of the right lower extremity was examined using duplex ultrasound. B-mode imaging demonstrated normal compressibility of the right common femoral, femoral, popliteal, posterior tibial and peroneal veins. Doppler flow signals were spontaneous and phasic at all levels. RIGHT SAPHENOUS VEINS Right great saphenous vein at the saphenofemoral junction demonstrates normal compressibility with spontaneous and phasic venous hemodynamics. LEFT LEG The deep venous system of the left lower extremity was examined using duplex ultrasound. B-mode imaging demonstrated normal compressibility of the left common femoral, femoral, popliteal, posterior tibial and peroneal veins. Doppler flow signals were spontaneous and phasic at all levels. LEFT LEG SAPHENOUS VEINS Left great saphenous vein at the saphenofemoral junction demonstrates normal compressibility with spontaneous and phasic venous hemodynamics. ______________________________________________________________________________ Electronically signed by: Dr Blenda Fear, M.D                       04/09/2024, 8: 57 AM     XR CHEST (2 VW)  Result Date: 04/08/2024  History: Shortness of breath Comparison: None Technique: PA and lateral radiographs of the chest were obtained. Two views, 2 exposures total. Findings: The lungs and pleural spaces are clear. Normal cardiomediastinal silhouette. Degenerative changes of the thoracic spine.     IMPRESSION:  No acute cardiopulmonary disease. Electronically signed by: Norleen Gleason 04/08/2024 11:20 AM EDT          Workstation ID: [id number]        I have independently reviewed lab studies and imgaing as well as review of nursing notes and physican notes from the past 24 hours.    Dragon medical dictation software was used for portions of this report. Unintended errors may occur.     VIVIANE GORMAN GORE, MD  April 22, 2024    [facility]   Office Phone:[phone]  Fax:[phone]

## 2024-04-22 NOTE — Progress Notes (Addendum)
 Assessment/Plan:     Patient seen for complicated medical problems as listed below :    Right foot abscess   Left foot abscess  S/p I and D in ER  PAD bilateral lower extremities  Chronic hypoxic resp failure  COPD  Possible pneumonia  Plan     Status post angiogram, discussed with Dr. Maree, wound VAC removed, tissue clean, good bleeding.  Greatly appreciate assist.  Reviewed CT, showed bilateral foot abscess.  7/17 s/p OR with I&D and placement of wound VAC on right foot-or cultures negative so far and awaiting bone path. 7/19 CT still showing multiple abscesses in the right midfoot and subcu area.  Left foot also showed multiple abscess, will need further I&D.  Podiatry is on board, timing per podiatrist.  CTA lower extremities done showing moderate PAD  Continue empiric IV vancomycin /cefepime /Flagyl .  Vanco trough 19.3, dose vancomycin  accordingly.  Infectious disease following  Continue bronchodilators  O2 supplementation  Dvt ppx with heparin  subcu  Discussed with patient, Dr. Maree, Dr. Meade   Continue home regimen for otherwise chronic, stable medical conditions as noted above.   I will order CBC and renal function for tomorrow    Reason for continued hospitalization: Multiple foot abscess bilaterally    EXAM:  GENERAL: in mild distress.   RESPIRATORY: Bilateral BS present. No rales or rhonchi.   CARDIOVASCULAR: S1 and S2 present, regular. No murmur  ABDOMEN: Soft and nontender with positive bowel sounds.   NEUROLOGICAL: Cranial nerves II through XII grossly intact. No focal weakness.     Subjective:         Vitals:    04/22/24 0911 04/22/24 1124 04/22/24 1404 04/22/24 1552   BP:  (!) 102/45  94/60   Pulse:  62  85   Resp: 17 18 18 19    Temp:  98.4 F (36.9 C)  98.4 F (36.9 C)   TempSrc:  Temporal  Temporal   SpO2:  100%  100%   Weight:       Height:           Recent Labs     04/21/24  0615   WBC 13.0*   HGB 8.3*   HCT 28.1*   MCV 97.9   PLT 413     Recent Labs     04/22/24  0510   NA 138   K 5.2*   CL  104   CO2 28   ANIONGAP 6   CALCIUM  9.4   BUN 15   CREATININE 0.91   GLUCOSE 75     No results for input(s): LABALBU, TP, AST, ALKPHOS, BILITOT, BILIDIR, ALT, GLOB in the last 72 hours.    Invalid input(s): AGRAT  Lab Results   Component Value Date/Time    COLORU Yellow 04/15/2024 03:02 AM    CLARITYU Clear 04/15/2024 03:02 AM    GLUCOSEU Negative 04/15/2024 03:02 AM    BILIRUBINUR Negative 04/15/2024 03:02 AM    KETUA Negative 04/15/2024 03:02 AM    BLOODU Trace-lysed 04/15/2024 03:02 AM    PHUR 5.5 04/15/2024 03:02 AM    NITRU Negative 04/15/2024 03:02 AM    LEUKOCYTESUR Trace 04/15/2024 03:02 AM     No results for input(s): POCGLU in the last 72 hours.      Discussed with patient and family regarding management, prognosis, treatment and complications of the patient's medical conditions in detail. All questions answered  to the satisfaction of those individual(s) who also verbalized understanding of and agreement with the  assessment and plan.     Dr. [place], Garnette RAMAN, MD, thank you for allowing us  to participate in the care of this patient.  Please contact me through the hospital if questions arise.  Dragon medical dictation software was used for portions of this report. Unintended errors may occur.     Olam Campi D.O.  [facility]

## 2024-04-23 LAB — RENAL FUNCTION PANEL
Albumin: 2 g/dL — ABNORMAL LOW (ref 3.4–5.0)
Anion Gap: 5 mmol/L (ref 5–15)
BUN: 27 mg/dL — ABNORMAL HIGH (ref 9–23)
CO2: 27 meq/L (ref 20–31)
Calcium: 8.9 mg/dL (ref 8.7–10.4)
Chloride: 104 meq/L (ref 98–107)
Creatinine: 1.09 mg/dL — ABNORMAL HIGH (ref 0.55–1.02)
GFR African American: >60
GFR Non-African American: 53
Glucose: 119 mg/dL — ABNORMAL HIGH (ref 74–106)
Phosphorus: 3.2 mg/dL (ref 2.4–5.1)
Potassium: 5 meq/L (ref 3.5–5.1)
Sodium: 136 meq/L (ref 136–145)

## 2024-04-23 LAB — BASIC METABOLIC PANEL
Anion Gap: 6 mmol/L (ref 5–15)
BUN: 27 mg/dL — ABNORMAL HIGH (ref 9–23)
CO2: 27 meq/L (ref 20–31)
Calcium: 8.9 mg/dL (ref 8.7–10.4)
Chloride: 104 meq/L (ref 98–107)
Creatinine: 1.08 mg/dL — ABNORMAL HIGH (ref 0.55–1.02)
GFR African American: >60
GFR Non-African American: 53
Glucose: 118 mg/dL — ABNORMAL HIGH (ref 74–106)
Potassium: 5 meq/L (ref 3.5–5.1)
Sodium: 137 meq/L (ref 136–145)

## 2024-04-23 LAB — VANCOMYCIN LEVEL, TROUGH: Vancomycin Tr: 18.9 ug/mL — ABNORMAL HIGH (ref 5.0–10.0)

## 2024-04-23 MED FILL — OXYCODONE-ACETAMINOPHEN 7.5-325 MG PO TABS: 7.5-325 mg | ORAL | Qty: 1

## 2024-04-23 MED FILL — GERI-KOT 8.6 MG PO TABS: 8.6 mg | ORAL | Qty: 1

## 2024-04-23 MED FILL — CLOPIDOGREL BISULFATE 75 MG PO TABS: 75 mg | ORAL | Qty: 1

## 2024-04-23 MED FILL — HEPARIN SODIUM (PORCINE) 5000 UNIT/ML IJ SOLN: 5000 [IU]/mL | INTRAMUSCULAR | Qty: 1

## 2024-04-23 MED FILL — VANCOMYCIN (VANCOCIN) 1000 MG IN SODIUM CHLORIDE 0.9% 250 ML IVPB: Qty: 250

## 2024-04-23 MED FILL — ATORVASTATIN CALCIUM 40 MG PO TABS: 40 mg | ORAL | Qty: 1

## 2024-04-23 MED FILL — ASPIRIN LOW DOSE 81 MG PO TBEC: 81 mg | ORAL | Qty: 1

## 2024-04-23 MED FILL — CEFEPIME HCL 1 G IJ SOLR: 1 g | INTRAMUSCULAR | Qty: 1000

## 2024-04-23 MED FILL — METRONIDAZOLE 500 MG/100ML IV SOLN: 500 MG/100ML | INTRAVENOUS | Qty: 100

## 2024-04-23 MED FILL — MORPHINE SULFATE 2 MG/ML IJ SOLN: 2 mg/mL | INTRAMUSCULAR | Qty: 1 | Fill #0

## 2024-04-23 NOTE — Progress Notes (Signed)
 Assessment/Plan:     Patient seen for complicated medical problems as listed below :    Right foot abscess   Left foot abscess  S/p I and D in ER  PAD bilateral lower extremities  Chronic hypoxic resp failure  COPD  Possible pneumonia  Plan     Status post angiogram, discussed with Dr. Maree, wound VAC removed, tissue clean, good bleeding.  Greatly appreciate assist.  Reviewed CT, showed bilateral foot abscess.  7/17 s/p OR with I&D and placement of wound VAC on right foot-or cultures negative so far and awaiting bone path. 7/19 CT still showing multiple abscesses in the right midfoot and subcu area.  Left foot also showed multiple abscess, podiatrist planning debridement of right ankle as well as I&D of left foot abscess on Friday at 7:30 AM.  Greatly appreciate assist.  CTA lower extremities showing moderate PAD  Continue empiric IV vancomycin /cefepime /Flagyl .  Vanco trough 19.3, dose vancomycin  accordingly.  Infectious disease following  Continue bronchodilators  O2 supplementation  Dvt ppx with heparin  subcu  Discussed with patient, Dr. Maree, Dr. Meade   Continue home regimen for otherwise chronic, stable medical conditions as noted above.   I will order CBC and renal function for tomorrow    Reason for continued hospitalization: OR Friday    EXAM:  GENERAL: in mild distress.   RESPIRATORY: Bilateral BS present. No rales or rhonchi.   CARDIOVASCULAR: S1 and S2 present, regular. No murmur  ABDOMEN: Soft and nontender with positive bowel sounds.   NEUROLOGICAL: Cranial nerves II through XII grossly intact. No focal weakness.     Subjective:         Vitals:    04/22/24 2330 04/22/24 2332 04/23/24 0430 04/23/24 0841   BP: (!) 90/51 (!) 95/58 (!) 111/54 104/76   Pulse: 82 80 78 76   Resp: 14  17 19    Temp: 97.7 F (36.5 C)  99.5 F (37.5 C) 98.2 F (36.8 C)   TempSrc: Temporal  Temporal Temporal   SpO2: 97%  100% 100%   Weight:       Height:           Recent Labs     04/21/24  0615   WBC 13.0*   HGB 8.3*   HCT  28.1*   MCV 97.9   PLT 413     Recent Labs     04/23/24  0726   NA 136  137   K 5.0  5.0   CL 104  104   CO2 27  27   ANIONGAP 5  6   CALCIUM  8.9  8.9   BUN 27*  27*   CREATININE 1.09*  1.08*   GLUCOSE 119*  118*     No results for input(s): LABALBU, TP, AST, ALKPHOS, BILITOT, BILIDIR, ALT, GLOB in the last 72 hours.    Invalid input(s): AGRAT  Lab Results   Component Value Date/Time    COLORU Yellow 04/15/2024 03:02 AM    CLARITYU Clear 04/15/2024 03:02 AM    GLUCOSEU Negative 04/15/2024 03:02 AM    BILIRUBINUR Negative 04/15/2024 03:02 AM    KETUA Negative 04/15/2024 03:02 AM    BLOODU Trace-lysed 04/15/2024 03:02 AM    PHUR 5.5 04/15/2024 03:02 AM    NITRU Negative 04/15/2024 03:02 AM    LEUKOCYTESUR Trace 04/15/2024 03:02 AM     No results for input(s): POCGLU in the last 72 hours.      Discussed with patient and family regarding  management, prognosis, treatment and complications of the patient's medical conditions in detail. All questions answered  to the satisfaction of those individual(s) who also verbalized understanding of and agreement with the assessment and plan.     Dr. [place], Garnette RAMAN, MD, thank you for allowing us  to participate in the care of this patient.  Please contact me through the hospital if questions arise.  Dragon medical dictation software was used for portions of this report. Unintended errors may occur.     Olam Campi D.O.  [facility]

## 2024-04-23 NOTE — Progress Notes (Signed)
 Vascular f/u    BP (!) 111/54   Pulse 78   Temp 99.5 F (37.5 C) (Temporal)   Resp 17   Ht 1.702 m (5' 7)   Wt 81.6 kg (179 lb 14.3 oz)   SpO2 100%   BMI 28.18 kg/m     Feels OK  Palpable popliteal pulse and good peroneal signal    Discussed with Dr Arthor Fear yesterday afternoon  Note plans for OR with podiatry for bilateral foot interventions    Following    Mayvis Agudelo M. Maree, MD, FACS  Office:  [phone]  Pager:  [phone]

## 2024-04-23 NOTE — Wound Image (Signed)
 Wound vac removed this am, was on overnight. Wound measures 9cm x7cm x deepest depth 1 cm. Surrounded area macerated. Cleaned and wet-to-dry dressing applied this am at MD request.

## 2024-04-23 NOTE — Progress Notes (Signed)
 INFECTIOUS DISEASE FOLLOW UP NOTE     Date of admission: 04/14/2024     Date of consult: April 15, 2024        ABX:      Current abx Prior abx    Cefepime /Vanco 7/14-9  Flagyl  7/15-8        ASSESSMENT:       Right foot abscess  -Unclear cause of swelling and pain that has been present for several months  -Has been evaluated by orthopedic  -Patient seen by [facility] specialist Dr. Curtistine Emperor with last visit 03/11/2024 for right contusion of dorsum of foot, lower extremity edema and right foot pronation deformity  -03/08/2024 MRI right hindfoot no fracture.  Mild multifocal osseous edema?  Altered gait.  And mechanical stress-related changes.  Mild intrinsic foot muscular atrophic and intramuscular edema.  Subcu edema along the dorsum of the foot  -xray no fracture  -S/p I&D by ED pending cultures   - Patient seen by podiatrist Dr. Roberts      Left foot swelling  -Xray no fracture  -Low threshold for   Leucocytosis POA   AKI/CKD  -Abnormal creatinine few weeks ago on routine blood work at [employer]   PAD  -Patient has been following with vascular surgeon and last seen in office on 04/08/2024  -12/2023 ABI at [facility] with severe left lower extremity arterial insufficiency at rest and right first digital disease with known proximal arterial insufficiency  -12/2023 s/p right lower extremity angiogram showed single-vessel peroneal runoff with good branches   Ch hypoxic resp failure   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  -Has been following with neurosurgery at [facility] artery aneurysm  -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 and following with [facility] neurosurgery with plan for continued management   Ax- PCN -as a child   Co-morb- COPD, schizoaffective disorder, HTN, depression,              RECOMMENDATIONS:      Complicated 73 year old female with significant comorbidities coming with bilateral feet swelling, right worse than left    -Patient afebrile, stable vitals, on nasal cannula  -BUN/Cr 27/1.09, Last WBC 13, Hb 8.3, PLT 413, lactic acid 0.8, Pro-Cal 0.19, last Vanco trough 18.9  -Micro 7/17 wound right foot Gram stain negative, culture NTD, ANA NTD, right heel bone Gram stain negative, culture negative, ANA NTD, 7/15 urine less than 10k, superficial wound culture negative, 7/14 blood culture x 2 negative  -Imaging nothing new  - Path acute and chronic osteomyelitis       Assessment     Right foot abscess  - Coming with swelling, pain of right foot   - Understand I&D in ED by provider and cultures negative so far  - CT concerning for abscess on right foot  - 7/17 s/p OR with I&D and placement of wound VAC -or cultures negative so far   - Path suggestive of acute and chronic osteomyelitis   - 7/19 CT still showing multiple abscesses in the midfoot and subcu area  -Podiatry planning debridement of right ankle ulcer with graft application later this week  -Anticipate deep cultures  -Local wound care per  - Unclear plan on duration of antibiotic to continue with broad-spectrum antibiotics for now     Left foot swelling  - Tenderness on dorsal aspect  - 7/17 CT suggestive of cellulitis with focal area of collection?  Abscess and  CTA 7/19 poorly defined collections foot and subcu left knee  -7/19 CTA with poorly defined fluid collections in the left foot  -Podiatry planning I&D of left foot-anticipate deep cultures  - Covering with broad-spectrum antibiotic     PAD  -Previous history of angio but recent ABI with monophasic waveforms in femoral and plan for angio per vascular  -Vascular study 7/16 moderate right iliac-femoral, severe left arterial insufficiency at femoropopliteal region  - Complains of increasing BLE pain today question from this versus other, denies history of gout  -s/p angio 7/21  and muscular     Bilateral leg edema  - Recent PVL 7/8 was negative for DVT but low threshold to repeat     Leukocytosis likely reactive and monitor     AKI?  CKD  - Avoid nephrotoxic medications and dose for current creatinine clearance  - Renal ultrasound without any stone or obstruction    Other details as above       Rec:  - Continue IV Vanco, cefepime  and Flagyl  for now  -Unclear duration on antibiotics at this point  -Anticipate new culture data and Pap if indicated with debridement of feet  -Local wound care per others  - Podiatry follow  - Vascular following  - Continue to monitor left knee with question left knee fluid collection on CTA  - Keep legs elevated on pillows  - Monitor serial labs  - Monitor for any ADE  - Remains at risk for worsening   -Discussed with Dr. Olam Campi                 MICROBIOLOGY:      7/14 Blcx x2 IP              Wd cx IP  7/15 Ucx IP  7/17 anaerobic x 2 IP wound NOS, NGTD surgical wound NGTD right heel     LINES AND CATHETERS:      PIV     SUMMARY:      Ms. Susan [name] is a 73 year old African-American female with past medical history of COPD, schizoaffective disorder, hypertension, depression, PAD, peripheral neuropathy was admitted on 7/14 with worsening leg pain.     Patient is not a good historian and says lives with her daughter.  She has been almost bedbound for quite some time. Pt has been following with [facility] for basilar artery aneurysm and conservative Mx.  Patient also developed right leg swelling for which she was referred to Ortho and eventually podiatry.  It was not thought to be the part of her back leading to right leg swelling and neuropathy.  Patient also following with vascular and had procedure done in March with good runoff and did not think was concern for pain and swelling.  Patient last seen by podiatry 03/11/2024 and plan for conservative management.      She was seen in ED on 8 July with leg swelling.  Workup showed negative PVL for DVT and chest x-ray without  any significant finding.  She was noted with pitting edema and was given some Lasix  and asked to follow-up with PCP for adjustment in medications.  Patient returned 7/14 with worsening swelling of the legs, right worse than left.  Workup showed stable vitals, afebrile state.  WBC elevated at 19, Hb 9.8, PLT 354, BUN 34, CR 1.43, AST 17, ALT 14.  Blood cultures were obtained.  Patient had bilateral feet x-ray which showed no fracture or concern for osteo.  Chest x-ray was suggestive  of left basilar opacity with developing pneumonia versus atelectasis.  Patient had I&D in the ED.  Podiatry was consulted and CT was ordered.  Wound culture in progress.  Patient on broad-spectrum antibiotic with cefepime  and Vanco.  ID was asked for further evaluation management.       SUBJECTIVE :     Interval notes reviewed.  Patient afebrile.  Continues to complain of pain in her feet.  Plan regarding further surgery discussed and feels frustrated.  Wants better pain medications and does not want me to touch her feet.  Pictures from earlier wound change reviewed    OBJECTIVE     BP (!) 111/54   Pulse 78   Temp 99.5 F (37.5 C) (Temporal)   Resp 17   Ht 1.702 m (5' 7)   Wt 81.6 kg (179 lb 14.3 oz)   SpO2 100%   BMI 28.18 kg/m     Temp (24hrs), Avg:98.6 F (37 C), Min:97.7 F (36.5 C), Max:99.5 F (37.5 C)    General: Fairly developed and nourished 73 y.o. year-old, female, chronically sick  HEENT: Normocephalic, anicteric sclerae, Pupils equal, round reactive to light, no oropharyngeal lesions. No sinus tenderness.  Neck: Supple, no lymphadenopathy, masses or thyromegaly  Chest: Symmetrical expansion  Lungs: Clear to auscultation bilaterally, no dullness  Heart: Regular rhythm, no murmur, no rub or gallop, No JVD  Abdomen: Soft, non-tender,non distended, no organomegaly, BS+  Musculoskeletal: Refused exam of lower extremities today but pictures reviewed of right foot  CNS: AAOx3.  Follows simple commands.  SKIN: No obvious  skin lesion or rash. Dry, warm, intact                   MEDICATIONS:     Current Facility-Administered Medications   Medication Dose Route Frequency Provider Last Rate Last Admin    atorvastatin  (LIPITOR ) tablet 40 mg  40 mg Oral Nightly Maree Ladona HERO, MD   40 mg at 04/22/24 2114    aspirin  EC tablet 81 mg  81 mg Oral Daily Maree Ladona HERO, MD   81 mg at 04/22/24 9157    clopidogrel  (PLAVIX ) tablet 75 mg  75 mg Oral Daily Maree Ladona HERO, MD   75 mg at 04/22/24 9157    albuterol  (PROVENTIL ) (2.5 MG/3ML) 0.083% nebulizer solution 2.5 mg  2.5 mg Nebulization Q4H PRN Sahibzada, Ayaz, MD        ipratropium 0.5 mg-albuterol  2.5 mg (DUONEB ) nebulizer solution 1 Dose  1 Dose Inhalation Q4H PRN Sahibzada, Ayaz, MD        vancomycin  (VANCOCIN ) 1000 mg in sodium chloride  0.9% 250 mL IVPB  1,000 mg IntraVENous Q24H Matriano, John Paul H, MD   Stopped at 04/23/24 0010    senna (SENOKOT) tablet 8.6 mg  1 tablet Oral Daily PRN Matriano, Norleen Deward DEL, MD        docusate sodium  (COLACE) capsule 100 mg  100 mg Oral BID PRN Matriano, Norleen Deward DEL, MD        ondansetron  (ZOFRAN ) injection 4 mg  4 mg IntraVENous Q6H PRN Matriano, John Paul H, MD   4 mg at 04/21/24 1544    acetaminophen  (TYLENOL ) tablet 650 mg  650 mg Oral Q6H PRN Matriano, John Paul H, MD   650 mg at 04/18/24 1054    melatonin tablet 3 mg  3 mg Oral Nightly PRN Matriano, Norleen Deward DEL, MD        naloxone  (NARCAN ) injection 0.4 mg  0.4 mg IntraVENous  PRN Matriano, Norleen Deward DEL, MD        glucagon  injection 1 mg  1 mg IntraMUSCular PRN Matriano, Norleen Deward DEL, MD        dextrose  50 % IV solution  20-30 mL IntraVENous PRN Matriano, John Paul H, MD        heparin  (porcine) injection 5,000 Units  5,000 Units SubCUTAneous BID Sahibzada, Ayaz, MD   5,000 Units at 04/22/24 2114    benzonatate  (TESSALON ) capsule 100 mg  100 mg Oral TID PRN Murrell Norleen Deward DEL, MD        cefepime  (MAXIPIME ) 1,000 mg in sodium chloride  0.9 % 50 mL IVPB (mini-bag)  1,000 mg IntraVENous Q12H Matriano,  Norleen Deward DEL, MD   Stopped at 04/23/24 [phone]    *Pharmacy to Dose Vancomycin   1 each Other RX Placeholder Matriano, Norleen Deward DEL, MD        morphine  (PF) injection 1 mg  1 mg IntraVENous Q3H PRN Murrell Norleen Deward DEL, MD   1 mg at 04/23/24 0557    sodium hypochlorite (DAKINS) 0.125 % external solution   Irrigation Daily Anastasio Adine LABOR, DPM        metroNIDAZOLE  (FLAGYL ) 500 mg in 0.9% NaCl 100 mL IVPB premix  500 mg IntraVENous Q8H Meade Viviane RAMAN, MD   Stopped at 04/23/24 0510    oxyCODONE -acetaminophen  (PERCOCET ) 7.5-325 MG per tablet 1 tablet  1 tablet Oral Q4H PRN Josepha Planas, DO   1 tablet at 04/23/24 0505         Labs: Results:   Chemistry Recent Labs     04/22/24  0510   NA 138   K 5.2*   CL 104   CO2 28   BUN 15      CBC w/Diff Recent Labs     04/21/24  0615   WBC 13.0*   RBC 2.87*   HGB 8.3*   HCT 28.1*   PLT 413          RADIOLOGY :        Imaging  IR ANGIOGRAM EXTREMITY RIGHT  Result Date: 04/21/2024  Radiology exam is complete. No Radiologist dictation. Please follow up with ordering provider.     CTA ABDOMINAL AORTA W BILAT RUNOFF W WO CONTRAST  Result Date: 04/19/2024  HISTORY: peripheral artery disease Technique: Noncontrast CT imaging is acquired through the chest for bolus tracking purposes. Enhanced CT imaging through the abdomen, pelvis, and lower extremities is performed during arterial phase of IV contrast menstruation as part of a CTA runoff. Angiographic reconstructions are performed formed at a separate workstation. The patient received an uneventful IV bolus of 100 mL of Omnipaque 350 nonionic contrast as part of the study. COMPARISON STUDY: none FINDINGS: Vascular structures and lower extremities: Abdominal aorta is normal in caliber and enhancement. Celiac, SMA, and renal arteries are moderately calcified yet patent with no high-grade stenosis. IMA is patent. Mild narrowing of the left common iliac artery. The right common iliac and external iliac arteries are patent. Hypogastric arteries are  patent. On the right, common femoral and profunda are patent. Diffuse mild disease of the right SFA with moderate grade narrowing in the mid SFA just proximal to the patent stent. The right popliteal artery is patent. Single-vessel runoff to the right foot through the peroneal artery. There is gradual diminished enhancement in the anterior and posterior tibial arteries. The ankle. Ulcerations are present in the right foot with subcutaneous gas and a vacuum. 2 cm abscess  in the plantar subcutaneous tissues with an air-fluid level noted. Additional 2.4 cm abscess in the dorsal subcutaneous tissues of the mid foot noted with an air-fluid level. On the left, common femoral artery is mildly narrowed secondary to plaque and the profunda is patent. Diffuse moderate disease of the left SFA with partially calcified plaque. Popliteal artery is patent. Moderate grade stenosis at the proximal posterior tibial artery. Two-vessel runoff via the posterior tibial and peroneal arteries. Diminished enhancement noted in the distal anterior tibial artery. Along the lateral aspect of the midfoot there is a 2.5 x 4.6 cm area of low density which may represent a developing abscess or phlegmon. There is also a region of edema in the anterior left lower leg at the level of the ankle. At the level of the knee, there is a irregular shaped fluid collection in the anterolateral knee just lateral to the tibial tuberosity which measures approximately 4.2 cm in diameter. Bibasilar subsegmental atelectasis. Heart size is borderline enlarged. The liver has a slight nodular contour. The gallbladder is present and contains excreted contrast. The spleen, pancreas, and adrenal glands are within normal notes. 2.6 cm left renal cyst. Kidneys are otherwise normal in morphology with no hydronephrosis. Appendix and small bowel loops are unremarkable. Diverticulosis is noted throughout the colon. No free intraperitoneal fluid or air. Uterus contains a  coarsely calcified leiomyoma. No adnexal or pelvic mass. Urinary bladder is unremarkable. Osseous structures the pelvis and lumbar spine are intact. No compression deformities.     IMPRESSION: 1. No aortic inflow stenosis. 2. Moderate grade stenosis of the mid right SFA and patent SFA stent. Patent right popliteal artery with single vessel runoff to the right foot. 3. Diffuse moderate disease of the left SFA with patent popliteal artery and diseased two-vessel runoff to the left foot. Moderate grade stenosis in the proximal left posterior tibial artery. 4. Open wound in the right foot with subcutaneous gas and multiple abscesses in the mid foot along the plantar surface and dorsal subcutaneous tissues. 5. Poorly defined fluid collections in the left foot and in the subcutaneous tissues of the anterior left knee which are concerning for additional abscesses. Electronically signed by: Freda Lanes, MD 04/19/2024 2:45 PM EDT          Workstation ID: [id number]     CT FOOT LEFT W CONTRAST  Result Date: 04/17/2024  HISTORY: Swelling and pain. TECHNIQUE: Helical acquisition CT images through the left foot was performed with contrast with multiplanar reformats. COMPARISON: None. FINDINGS: No visualized acute fracture or dislocation. No evidence of periosteal reaction or bony destruction to suggest osteomyelitis. There is extensive subcutaneous fat edema and stranding within the lateral soft tissues from the level of the mid foot to the forefoot. In particular, there is a prominent area of focal fluid measuring approximately 3.8 x 2.1 cm on image 20, series 5 that extends superficially to abut the skin and may represent a developing abscess. There is no definite visualized enhancing wall.     IMPRESSION: 1. Findings are compatible with cellulitis of the lateral midfoot and forefoot with more focal 3.8 cm area of fluid at the level of the midfoot that is suspicious for an early developing abscess as there is no discrete  capsule. 2. No evidence of osteomyelitis. Electronically signed by: Lillia Hug, MD 04/17/2024 11:37 AM EDT          Workstation ID: [id number]     LE ARTERIAL  Result Date: 04/16/2024  Version: 1                                                                                                                                   Study ID: [id number]                                                       [facility]                                                       [address] [place], [place]  [address]                                                           Arterial Lower Extremity Report Name: BRYN, SALINE Date: 04/15/2024, 2: 70 PM MRN: [id number]                                                               Patient Location: [facility] Age: 36 Years                                                               DOB: 11-Feb-1951 (MM/DD/YYYY)  Gender: Female Performed By: Edilia Romelia Elden Sherrell, RVT                          Account #:: [id number] Ordering Physician: ANASTASIO CAIN A Reason For Study: Peripheral vascular disease.                                                     INTERPRETATION SUMMARY 1. Moderate right lower extremity arterial insufficiency at the level of the iliac-femoral region. 2. Severe left lower extremity arterial insufficiency at the level of the femoral-popliteal region. 3. Digital pressures deferred due to patient's pain. QUALITY/PROCEDURE Arterial Physio ABI [phone]). A bilateral lower extremity arterial exam was performed. Continuous wave Doppler tracings were recorded from the common femoral, popliteal, posterior tibial  and dorsalis pedis arteries bilaterally, and resting ankle brachial pressures were measured. Digital pressures deferred due to patient's pain. RIGHT LEG SEGMENTAL Brachial = 131 mmHg. Ankle Pressure (PT) = 81 mmHg. Ankle Pressure (DP) = 83 mmHg. Right ABI = 0.63. The lower extremity ankle brachial index is 0.63 on the right. The right lower extremity waveforms are monophasic in the femoral, the popliteal, the posterior tibial and the dorsalis pedis arteries. RIGHT DIGIT PPG Digit 1 flow is absent. LEFT LEG SEGMENTAL Brachial = 126 mmHg. Ankle (PT) = 59 mmHg. Ankle (DP) = 63 mmHg. Left ABI = 0.48. The lower extremity ankle brachial index is 0.48 on the left. The left lower extremity waveforms are multiphasic in the femoral, and monophasic in the popliteal, the posterior tibial and the dorsalis pedis arteries. LEFT DIGIT PPG Digit 1 flow is absent. ______________________________________________________________________________ Electronically signed by: Dr Blenda Fear, M.D                       04/16/2024, 10: 01 PM     US  RETROPERITONEAL COMPLETE  Result Date: 04/15/2024  INDICATION: Acute renal failure. COMPARISON: None. TECHNIQUE: Transabdominal grayscale and duplex sonography of the retroperitoneum performed. FINDINGS: RIGHT KIDNEY:  10.7 cm in length. (Normal range 9-13 cm) Normal renal parenchymal echogenicity. No obstruction, renal calculi or focal lesion. LEFT KIDNEY: 9.8 cm in length. (Normal range 9-13 cm) Normal renal parenchymal echogenicity.  And interpolar simple-appearing partially exophytic left renal cyst measures 2.6 x 2.6 x 2.1 cm. URINARY BLADDER: Bladder has a pre-void volume of 240.0 mL. (<50 mL WNL) Patient did not void for the exam. Neither ureteral jet(s) identified. No urinary bladder wall thickening or debris within the urinary bladder. (Normal wall thickness <3 mm distended, <5 mm underdistended.) Bladder contains a Foley catheter and is decompressed. PANCREAS:  Visualized pancreas is within  normal limits. IVC: Patent. ABDOMINAL AORTA: Normal caliber abdominal aorta. COMMON ILIAC ARTERIES: Suboptimally visualized secondary to interposed bowel gas.     IMPRESSION: 1.  No hydronephrosis. Kidneys unremarkable sonographically with the exception of a partially exophytic simple-appearing interpolar left renal cyst. 2.  Urinary bladder unremarkable sonographically. 3.  Common iliac arteries above the visualized sonographically due to interposed bowel gas Electronically signed by: Alm Conte, MD 04/15/2024 8:34 PM EDT          Workstation ID: [id number]     CT FOOT RIGHT W CONTRAST  Result Date: 04/15/2024  Indication: Right foot infection. Evaluate for abscess. Comparison: None available Technique: Computed tomographic imaging of the right  foot was performed following IV contrast administration. Coronal and sagittal reconstructions were created from the initial axial data set and submitted for interpretation. Findings: Complex gas and fluid containing abscess//phlegmon in the subcutaneous lateral hindfoot spanning approximately 4.4 cm craniocaudal by 2 x 1.5 cm transverse. This is positioned posterior to the peroneal tendons which are intact and not overtly involved. There is subtle loss of bony cortex/rarefaction at the lateral margin of the subjacent calcaneus concerning for osteomyelitis.     IMPRESSION: 1. Subcutaneous phlegmon/abscess lateral right hindfoot and 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 of the subjacent lateral margin of the calcaneus. Electronically signed by: Glade Reeve, MD 04/15/2024 2:16 PM EDT          Workstation ID: [id number]     XR FOOT RIGHT (2 VIEWS)  Result Date: 04/15/2024  Exam: 2 views of the right foot. Clinical indications:  PAIN. RULE OUT OSTEOMYELITIS. Right foot pain and swelling. Comparison:   04/15/2024;  Findings:  No fracture.  No dislocation. No radiopaque foreign bodies. No bone erosions.     Impression:  No fracture. No bone erosions to suggest osteomyelitis. Electronically  signed by: Celine Rear, MD 04/15/2024 12:52 AM EDT          Workstation ID: [id number]     XR FOOT LEFT (2 VIEWS)  Result Date: 04/15/2024  Exam: 2 views of the left foot. Clinical indications:  PAIN right foot. Swelling. Possible osteomyelitis. Comparison:  None Findings:  No fracture.  No dislocation. No bone erosions. No radiopaque foreign bodies.     Impression:  No fracture. Electronically signed by: Celine Rear, MD 04/15/2024 12:51 AM EDT          Workstation ID: [id number]     XR CHEST PORTABLE  Result Date: 04/15/2024  Exam: AP portable chest Clinical indication: SEPSIS Comparison: 04/08/2024;  Results:  Left basilar opacity. No pneumothorax. Heart normal.  Mediastinal contours are normal. No free air is seen under the hemidiaphragms.   Osseous structures intact.     IMPRESSION: Left basilar opacity. Developing pneumonia versus atelectasis. Electronically signed by: Celine Rear, MD 04/15/2024 12:41 AM EDT          Workstation ID: [id number]     VL DUP LOWER EXTREMITY VENOUS BILATERAL  Result Date: 04/09/2024                                                                                                                                    Version: 1  Study ID: [id number]                                                       [facility]                                                       [address] [place], [place]  [address]                                                      Lower Extremity Venous Duplex Report Name: LEASHA, GOLDBERGER Date: 04/08/2024, 2: 20 PM MRN: [id number]                                                               Patient Location: ER^[facility]^WT^[facility] Age: 20 Years                                                                DOB: November 27, 1950 (MM/DD/YYYY)                                                                               Gender: Female Performed By: Kylee R. Holloman, RVT                                       Account #:: [id number] Ordering Physician: [facility], ADIT B Reason For Study: Lower extremity pain and swelling  INTERPRETATION SUMMARY 1. No evidence of deep vein thrombosis in the bilateral lower extremities. QUALITY/PROCEDURE Complete bilateral venous duplex performed. Quality of the study is good. Location: ER. RIGHT LEG The deep venous system of the right lower extremity was examined using duplex ultrasound. B-mode imaging demonstrated normal compressibility of the right common femoral, femoral, popliteal, posterior tibial and peroneal veins. Doppler flow signals were spontaneous and phasic at all levels. RIGHT SAPHENOUS VEINS Right great saphenous vein at the saphenofemoral junction demonstrates normal compressibility with spontaneous and phasic venous hemodynamics. LEFT LEG The deep venous system of the left lower extremity was examined using duplex ultrasound. B-mode imaging demonstrated normal compressibility of the left common femoral, femoral, popliteal, posterior tibial and peroneal veins. Doppler flow signals were spontaneous and phasic at all levels. LEFT LEG SAPHENOUS VEINS Left great saphenous vein at the saphenofemoral junction demonstrates normal compressibility with spontaneous and phasic venous hemodynamics. ______________________________________________________________________________ Electronically signed by: Dr Blenda Fear, M.D                       04/09/2024, 8: 57 AM     XR CHEST (2 VW)  Result Date: 04/08/2024  History: Shortness of breath Comparison: None Technique: PA and lateral radiographs of the chest were obtained. Two views, 2 exposures total. Findings: The lungs and pleural spaces are clear. Normal cardiomediastinal silhouette.  Degenerative changes of the thoracic spine.     IMPRESSION: No acute cardiopulmonary disease. Electronically signed by: Norleen Gleason 04/08/2024 11:20 AM EDT          Workstation ID: [id number]        I have independently reviewed lab studies and imgaing as well as review of nursing notes and physican notes from the past 24 hours.    Dragon medical dictation software was used for portions of this report. Unintended errors may occur.     VIVIANE GORMAN GORE, MD  April 23, 2024    [facility]   Office [phone]  Fax:[phone]

## 2024-04-23 NOTE — Progress Notes (Signed)
 PHYSICAL THERAPY TREATMENT:   Time  PT Charge Capture  Rehab Caseload Tracker  [place]    -    Patient: Susan [name] (73 y.o. female)  Room: 5212/5212    Primary Diagnosis: Foot abscess [L02.619]  Right leg pain [M79.604]  Abscess of right foot [L02.611]  Procedure(s) (LRB):  RIGHT HEEL IRRIGATION AND DEBRIDEMENT WITH WOUND VAC APPLICATION (Right) 6 Days Post-Op   Length of Stay:  8 day(s)   Insurance: Payor: BCBS MEDICARE / Plan: ANTHEM FULL DUAL ADVANTAGE 2 / Product Type: *No Product type* /      Date: 04/23/2024  Time In: 1545       Time Out: 1608   Total Minutes: 23    Isolation:  No active isolations       MDRO: No active infections    Precautions: falls   Ordered weight bearing status: non-weight bearing     ASSESSMENT:      Based on the objective data described below, the patient presents with  - activity is limited by pain  - Pt able to perform leg exs at bed level but decline to sit on EOB  - c/o pain on both legs during exs  - pt require min assist in supine to long sit.  - demo good sitting balance.  Recommendations:  Recommend continued physical therapy during acute stay. Physical Therapy. Recommend out of bed activity to counteract ill effects of bedrest, with assistance from staff as needed.  Discharge Recommendations: Skilled nursing facility (SNF): patient will benefit from further therapy at rehab facility to increase strength and endurance to return to prior level of function.  Further Equipment Recommendations for Discharge: DME to be determined at time of discharge from SNF.         PLAN:     Patient will be followed by physical therapy to address goals per initial plan of care.    COMMUNICATION/EDUCATION:     Education: Ill effects of bedrest, benefits of activity, HEP, role of PT, PT plan of care.    Education provided to: patient  Opportunity for questions and clarification was provided.    Readiness to learn indicated by: verbalized understanding    Barriers to learning/limitations:  none    Comprehension: Patient communicated comprehension        SUBJECTIVE:      Patient agreed to PT c/o pain on B LE  Patient reports 7/10 pain before treatment and 7-8/10 pain at conclusion of treatment.  Pain Location: B LE      OBJECTIVE DATA SUMMARY:      Orders, labs, and chart reviewed on Hargun C Hale.     PATIENT FOUND:     Semi reclined in bed. (+) bed alarm. (+) O2 nasal cannula. (+) daughter present.    COGNITIVE STATUS:     Mental Status:  Oriented x3   Communication:  normal   Follows commands:  follows one step commands/direction   General cognition:  intact   Safety/Judgement:  appropriate awareness of environment and need for assistance     THERAPEUTIC ACTIVITIES; FUNCTIONAL MOBILITY AND BALANCE STATUS:     Patient received/participated in Therapeutic Activities (8 minutes)  and Therapeutic Exercises (15 minutes)    Bed mobility:  Scooting: min assist; with bed flat;   Rolling: min assist; with bed flat;   Sup->sit: min assist; with HOB slightly raised;   Sit->supine:  min assist; with HOB slightly raised;   Via long sitting  Transfers:  Sit - stand:  not tested;    Stand - sit: not tested;         Gait Training:  unable         Balance:   Static sitting balance:          good       Dynamic sitting balance:     good       Static standing balance:      not tested      Dynamic standing balance: not tested        THERAPEUTIC EXERCISES:      gentle AA/AROM BLE:  heel slides, hip ab/duction, straight leg raise, glute sets, quad sets      ACTIVITY TOLERANCE:     - motivated to increase activity  - activity is limited by pain    FINAL LOCATION:     Positioned in bed, all needs within reach. Patient agrees to call for assistance. Head of bed elevated. As found. Positioned with pillows for comfort. (+) bed alarm. (+) O2. (+) daughter present.    Thank you for this referral.  Islam Villescas C. Najat Olazabal, PT

## 2024-04-23 NOTE — Progress Notes (Signed)
 Patient scheduled for debridement of right ankle ulcer with graft application and wound VAC application along with drainage of abscess on left foot. Tentatively scheduled for Friday 04/25/2024 At 730 AM   Will discuss with daughter   Thank you

## 2024-04-24 LAB — RENAL FUNCTION PANEL
Albumin: 2.1 g/dL — ABNORMAL LOW (ref 3.4–5.0)
Anion Gap: 6 mmol/L (ref 5–15)
BUN: 31 mg/dL — ABNORMAL HIGH (ref 9–23)
CO2: 27 meq/L (ref 20–31)
Calcium: 9.4 mg/dL (ref 8.7–10.4)
Chloride: 104 meq/L (ref 98–107)
Creatinine: 0.91 mg/dL (ref 0.55–1.02)
GFR African American: >60
GFR Non-African American: >60
Glucose: 85 mg/dL (ref 74–106)
Phosphorus: 3.5 mg/dL (ref 2.4–5.1)
Potassium: 5.4 meq/L — ABNORMAL HIGH (ref 3.5–5.1)
Sodium: 137 meq/L (ref 136–145)

## 2024-04-24 LAB — CBC WITH AUTO DIFFERENTIAL
Basophils: 0.4 % (ref 0–3)
Eosinophils: 1.2 % (ref 0–5)
Hematocrit: 30.4 % — ABNORMAL LOW (ref 35.0–47.0)
Hemoglobin: 8.8 g/dL — ABNORMAL LOW (ref 11.0–16.0)
Immature Granulocytes %: 0.9 % (ref 0.0–3.0)
Lymphocytes: 11.3 % — ABNORMAL LOW (ref 28–48)
MCH: 28.9 pg (ref 25.4–34.6)
MCHC: 28.9 g/dL — ABNORMAL LOW (ref 30.0–36.0)
MCV: 100 fL — ABNORMAL HIGH (ref 80.0–98.0)
MPV: 10.2 fL — ABNORMAL HIGH (ref 6.0–10.0)
Monocytes: 11.8 % (ref 1–13)
Neutrophils Segmented: 74.4 % — ABNORMAL HIGH (ref 34–64)
Nucleated RBCs: 0 (ref 0–0)
Platelets: 453 1000/mm3 — ABNORMAL HIGH (ref 140–450)
RBC: 3.04 M/uL — ABNORMAL LOW (ref 3.60–5.20)
RDW: 58.1 — ABNORMAL HIGH (ref 36.4–46.3)
WBC: 10.9 1000/mm3 (ref 4.0–11.0)

## 2024-04-24 MED ORDER — PATIROMER SORBITEX CALCIUM 8.4 G PO PACK
8.4 | Freq: Once | ORAL | Status: AC
Start: 2024-04-24 — End: 2024-04-24
  Administered 2024-04-24: 15:00:00 8.4 g via ORAL

## 2024-04-24 MED FILL — ATORVASTATIN CALCIUM 40 MG PO TABS: 40 mg | ORAL | Qty: 1

## 2024-04-24 MED FILL — CEFEPIME HCL 1 G IJ SOLR: 1 g | INTRAMUSCULAR | Qty: 1000

## 2024-04-24 MED FILL — ASPIRIN LOW DOSE 81 MG PO TBEC: 81 mg | ORAL | Qty: 1

## 2024-04-24 MED FILL — VELTASSA 8.4 G PO PACK: 8.4 g | ORAL | Qty: 1

## 2024-04-24 MED FILL — CLOPIDOGREL BISULFATE 75 MG PO TABS: 75 mg | ORAL | Qty: 1

## 2024-04-24 MED FILL — VANCOMYCIN (VANCOCIN) 1000 MG IN SODIUM CHLORIDE 0.9% 250 ML IVPB: Qty: 250

## 2024-04-24 MED FILL — HEPARIN SODIUM (PORCINE) 5000 UNIT/ML IJ SOLN: 5000 [IU]/mL | INTRAMUSCULAR | Qty: 1

## 2024-04-24 MED FILL — METRONIDAZOLE 500 MG/100ML IV SOLN: 500 MG/100ML | INTRAVENOUS | Qty: 100

## 2024-04-24 MED FILL — OXYCODONE-ACETAMINOPHEN 7.5-325 MG PO TABS: 7.5-325 mg | ORAL | Qty: 1

## 2024-04-24 NOTE — Progress Notes (Signed)
 PHYSICAL THERAPY    Time  PT Charge Capture  Rehab Caseload Tracker       Patient: Susan [name] (73 y.o. female)  Room: 5212/5212    Primary Diagnosis: Foot abscess [L02.619]  Right leg pain [M79.604]  Abscess of right foot [L02.611]   Procedure(s) (LRB):  RIGHT HEEL IRRIGATION AND DEBRIDEMENT WITH WOUND VAC APPLICATION (Right) 7 Days Post-Op  Date of Admission: 04/14/2024   Insurance: Payor: BCBS MEDICARE / Plan: ANTHEM FULL DUAL ADVANTAGE 2 / Product Type: *No Product type* /      Date: 04/24/2024  Time: 8469-8467    OBJECTIVE:     Orders, labs, and chart reviewed on Susan [name].     Patient was not seen for skilled physical therapy treatment, secondary to patient declined.  Not today patient requested not to have PT tx today.  PLAN:     Our services will follow up as patient's condition and/or schedule permit.    Erving Sassano C. Karyn Brull, PT  April 24, 2024

## 2024-04-24 NOTE — Progress Notes (Signed)
 Brief Nutrition Note:     Hostess called. Pt has declined Glucerna BID would like it once daily with breakfast, 2/2 to drinking to many give her diarrhea. RD adjusted order to daily with breakfast.     Current Diet: ADULT ORAL NUTRITION SUPPLEMENT; Breakfast, Lunch; Diabetic Oral Supplement  ADULT ORAL NUTRITION SUPPLEMENT; Lunch, Dinner; Wound Healing Oral Supplement  ADULT DIET; Regular     Wt Readings from Last 10 Encounters:   04/17/24 81.6 kg (179 lb 14.3 oz)   04/08/24 81.6 kg (180 lb)   12/19/23 88.5 kg (195 lb)   06/19/23 90.7 kg (200 lb)       Will continue to monitor and re-evaluate per protocols. Thank you.    Miranda Rumalda [place], [place], RD  04/24/24   Office: [place] [phone]

## 2024-04-24 NOTE — Progress Notes (Signed)
 Patient scheduled for right ankle Debridement with graft application and wound vac and left foot drainage of abscess tomorrow 7:30 am   NPO past midnight   Thank you

## 2024-04-24 NOTE — Anesthesia Pre-Procedure Evaluation (Signed)
 Department of Anesthesiology  Preprocedure Note       Name:  Susan [name]   Age:  73 y.o.  DOB:  02-28-1951                                          MRN:  [id number]         Date:  04/24/2024      Surgeon: Clotilde):  Rosamond Inches, DPM    Procedure: Procedure(s):  DEBRIDEMENT RIGHT FOOT WITH GRAFT APPLICATION; DRAINAGE OF ABSCESS LEFT FOOT  & WOUND VAC APPLICATION    Medications prior to admission:   Prior to Admission medications    Medication Sig Start Date End Date Taking? Authorizing Provider   furosemide  (LASIX ) 40 MG tablet Take 1 tablet by mouth daily 01/22/24  Yes [provider]   carvedilol (COREG) 12.5 MG tablet Take 1 tablet by mouth 2 times daily (with meals) 01/22/24  Yes [provider]   atorvastatin  (LIPITOR ) 80 MG tablet Take 1 tablet by mouth nightly 01/30/24  Yes [provider]   GEMTESA 75 MG TABS tablet Take 1 tablet by mouth daily 01/23/24  Yes [provider]   VRAYLAR 1.5 MG capsule Take 1 capsule by mouth daily   Yes [provider]   clopidogrel  (PLAVIX ) 75 MG tablet Take 1 tablet by mouth daily   Yes [provider]   QUEtiapine (SEROQUEL) 100 MG tablet Take 1 tablet by mouth daily   Yes [provider]   pantoprazole (PROTONIX) 40 MG tablet Take 1 tablet by mouth daily   Yes [provider]   potassium chloride (KLOR-CON) 10 MEQ extended release tablet Take 1 tablet by mouth daily with food   Yes [provider]   valsartan (DIOVAN) 160 MG tablet Take 1 tablet by mouth 2 times daily   Yes [provider]   MYRBETRIQ 25 MG TB24 Take 1 tablet by mouth daily   Yes [provider]   DOMINIC ELLIPTA 200-62.5-25 MCG/ACT AEPB inhaler Inhale 1 puff into the lungs daily   Yes [provider]       Current medications:    Current Facility-Administered Medications   Medication Dose Route Frequency Provider Last Rate Last Admin    atorvastatin  (LIPITOR ) tablet 40 mg  40 mg Oral Nightly Maree Ladona HERO, MD   40 mg at 04/23/24 2011    aspirin  EC tablet 81 mg  81 mg Oral Daily Maree Ladona HERO, MD   81 mg at 04/24/24 0740    clopidogrel  (PLAVIX ) tablet 75 mg  75 mg Oral Daily Maree Ladona HERO, MD   75 mg at 04/24/24 0740    albuterol  (PROVENTIL ) (2.5 MG/3ML) 0.083% nebulizer solution 2.5 mg  2.5 mg Nebulization Q4H PRN Sahibzada, Ayaz, MD        ipratropium 0.5 mg-albuterol  2.5 mg (DUONEB ) nebulizer solution 1 Dose  1 Dose Inhalation Q4H PRN Sahibzada, Ayaz, MD        vancomycin  (VANCOCIN ) 1000 mg in sodium chloride  0.9% 250 mL IVPB  1,000 mg IntraVENous Q24H Matriano, Norleen Deward DEL, MD   Stopped at 04/23/24 2345    senna (SENOKOT) tablet 8.6 mg  1 tablet Oral Daily PRN Matriano, Norleen Deward DEL, MD        docusate sodium  (COLACE) capsule 100 mg  100 mg Oral BID PRN Matriano, Norleen Deward DEL,  MD        ondansetron  (ZOFRAN ) injection 4 mg  4 mg IntraVENous Q6H PRN Matriano, John Paul H, MD   4 mg at 04/21/24 1544    acetaminophen  (TYLENOL ) tablet 650 mg  650 mg Oral Q6H PRN Matriano, John Paul H, MD   650 mg at 04/18/24 1054    melatonin tablet 3 mg  3 mg Oral Nightly PRN Matriano, Norleen Deward DEL, MD        naloxone  (NARCAN ) injection 0.4 mg  0.4 mg IntraVENous PRN Matriano, Norleen Deward DEL, MD        glucagon  injection 1 mg  1 mg IntraMUSCular PRN Matriano, Norleen Deward DEL, MD        dextrose  50 % IV solution  20-30 mL IntraVENous PRN Matriano, Norleen Deward DEL, MD        heparin  (porcine) injection 5,000 Units  5,000 Units SubCUTAneous BID Sahibzada, Ayaz, MD   5,000 Units at 04/24/24 0740    benzonatate  (TESSALON ) capsule 100 mg  100 mg Oral TID PRN Murrell Norleen Deward DEL, MD        cefepime  (MAXIPIME ) 1,000 mg in sodium chloride  0.9 % 50 mL IVPB (mini-bag)  1,000 mg IntraVENous Q12H Matriano, Norleen Deward DEL, MD   Stopped at 04/24/24 1420    *Pharmacy to Dose Vancomycin   1 each Other RX Placeholder Matriano, Norleen Deward DEL, MD        morphine  (PF) injection 1 mg  1 mg IntraVENous Q3H PRN Matriano, John Paul H, MD   1 mg at 04/23/24 0557     sodium hypochlorite (DAKINS) 0.125 % external solution   Irrigation Daily Anastasio Cain A, DPM        metroNIDAZOLE  (FLAGYL ) 500 mg in 0.9% NaCl 100 mL IVPB premix  500 mg IntraVENous Q8H Desai, Aarti S, MD   Stopped at 04/24/24 1839    oxyCODONE -acetaminophen  (PERCOCET ) 7.5-325 MG per tablet 1 tablet  1 tablet Oral Q4H PRN Josepha Planas, DO   1 tablet at 04/24/24 1858       Allergies:    Allergies   Allergen Reactions    Penicillins Other (See Comments)     States unknown reaction     Mother told her as a child she was allergic       Problem List:    Patient Active Problem List   Diagnosis Code    Asthma J45.909    Psychiatric disorder F99    Peripheral neuropathy G62.9    Chronic obstructive pulmonary disease (HCC) J44.9    Hypertension I10    OSA (obstructive sleep apnea) G47.33    Dyslipidemia E78.5    Bipolar 1 disorder (HCC) F31.9    Foot abscess L02.619       Past Medical History:  History reviewed. No pertinent past medical history.    Past Surgical History:        Procedure Laterality Date    LEG SURGERY Right 04/17/2024    RIGHT HEEL IRRIGATION AND DEBRIDEMENT WITH WOUND VAC APPLICATION performed by Rosamond Inches, DPM at [facility] MAIN OR       Social History:    Social History     Tobacco Use    Smoking status: Not on file    Smokeless tobacco: Not on file   Substance Use Topics    Alcohol use: Not on file  Counseling given: Not Answered      Vital Signs (Current):   Vitals:    04/24/24 0759 04/24/24 1206 04/24/24 1619 04/24/24 1935   BP: (!) 117/59 113/65 130/87 118/67   Pulse: 81 72 84 76   Resp: 18 18 20 19    Temp: 98.2 F (36.8 C) 97.9 F (36.6 C) 97.9 F (36.6 C) 97.6 F (36.4 C)   TempSrc: Temporal Temporal Oral Temporal   SpO2: 100% 100% 100% 100%   Weight:       Height:                                                  BP Readings from Last 3 Encounters:   04/24/24 118/67   04/08/24 (!) 100/50   12/19/23 (!) 156/85       NPO Status: Time of last liquid consumption: 2100                         Time of last solid consumption: 2100                        Date of last liquid consumption: 04/16/24                        Date of last solid food consumption: 04/16/24    BMI:   Wt Readings from Last 3 Encounters:   04/17/24 81.6 kg (179 lb 14.3 oz)   04/08/24 81.6 kg (180 lb)   12/19/23 88.5 kg (195 lb)     Body mass index is 28.18 kg/m.    CBC:   Lab Results   Component Value Date/Time    WBC 10.9 04/24/2024 05:10 AM    RBC 3.04 04/24/2024 05:10 AM    HGB 8.8 04/24/2024 05:10 AM    HCT 30.4 04/24/2024 05:10 AM    MCV 100.0 04/24/2024 05:10 AM    RDW 58.1 04/24/2024 05:10 AM    PLT 453 04/24/2024 05:10 AM       CMP:   Lab Results   Component Value Date/Time    NA 137 04/24/2024 05:10 AM    K 5.4 04/24/2024 05:10 AM    CL 104 04/24/2024 05:10 AM    CO2 27 04/24/2024 05:10 AM    BUN 31 04/24/2024 05:10 AM    CREATININE 0.91 04/24/2024 05:10 AM    GFRAA >60.0 04/24/2024 05:10 AM    LABGLOM >60 04/24/2024 05:10 AM    GLUCOSE 85 04/24/2024 05:10 AM    CALCIUM  9.4 04/24/2024 05:10 AM    BILITOT 0.40 04/16/2024 11:21 AM    ALKPHOS 82 04/16/2024 11:21 AM    AST 14.0 04/16/2024 11:21 AM    ALT 11 04/16/2024 11:21 AM       POC Tests: No results for input(s): POCGLU, POCNA, POCK, POCCL, POCBUN, POCHEMO, POCHCT in the last 72 hours.    Coags: No results found for: PROTIME, INR, APTT    HCG (If Applicable): No results found for: PREGTESTUR, PREGSERUM, HCG, HCGQUANT     ABGs: No results found for: PHART, PO2ART, PCO2ART, HCO3ART, BEART, O2SATART     Type & Screen (If Applicable):  Lab Results   Component Value Date    ABORH A Rh Positive 04/17/2024    LABANTI NEG 04/17/2024  Drug/Infectious Status (If Applicable):  Lab Results   Component Value Date/Time    HEPCAB <0.1 10/27/2015 12:00 AM       COVID-19 Screening (If Applicable): No results found for: COVID19        Anesthesia Evaluation  Patient summary reviewed and Nursing notes reviewed  Airway:            Dental:          Pulmonary:   (+)  COPD:    sleep apnea:       asthma:     (-) not a current smoker                           Cardiovascular:    (+) hypertension:, hyperlipidemia                  Neuro/Psych:   (+) neuromuscular disease (peripheral neuropathy):, psychiatric history:            GI/Hepatic/Renal:             Endo/Other:    (+) blood dyscrasia: anticoagulation therapy and anemia:..                 Abdominal:             Vascular:   + PVD, aortic or cerebral.       Other Findings:             Anesthesia Plan      MAC and general     ASA 3       Induction: intravenous.    MIPS: Postoperative opioids intended and Prophylactic antiemetics administered.                        Devonte Migues A Ward Boissonneault, DO   04/24/2024

## 2024-04-24 NOTE — Progress Notes (Signed)
 Assessment/Plan:     Patient seen for complicated medical problems as listed below :    Right foot abscess   Left foot abscess  S/p I and D in ER  PAD bilateral lower extremities  Chronic hypoxic resp failure  COPD  Possible pneumonia  Plan     Persistent borderline hyperkalemia.  Will give 1 dose of Veltassa  today.    Status post angiogram, discussed with Dr. Maree, wound VAC removed, tissue clean, good bleeding.  Greatly appreciate assist.  CT showed bilateral foot abscess.  7/17 s/p OR with I&D and placement of wound VAC on right foot-or cultures negative so far and awaiting bone path. 7/19 CT still showing multiple abscesses in the right midfoot and subcu area.  Left foot also showed multiple abscess, podiatrist planning debridement of right ankle as well as I&D of left foot abscess on Friday at 7:30 AM.  Greatly appreciate assist.  CTA lower extremities showing moderate PAD  Continue empiric IV vancomycin /cefepime /Flagyl .  Vanco trough  18.9, dose vancomycin  accordingly.    Continue bronchodilators  O2 supplementation  Dvt ppx with heparin  subcu  Discussed with patient   Continue home regimen for otherwise chronic, stable medical conditions as noted above.   I will order CBC and renal function for tomorrow    Reason for continued hospitalization: OR Friday morning    EXAM:  GENERAL: in mild distress.   RESPIRATORY: Bilateral BS present. No rales or rhonchi.   CARDIOVASCULAR: S1 and S2 present, regular. No murmur  ABDOMEN: Soft and nontender with positive bowel sounds.   NEUROLOGICAL: Cranial nerves II through XII grossly intact. No focal weakness.     Subjective:         Vitals:    04/23/24 1958 04/23/24 2252 04/24/24 0447 04/24/24 0759   BP: 95/64 (!) 109/55 (!) 123/54 (!) 117/59   Pulse: 76 80 84 81   Resp: 20 17 16 18    Temp: 97.5 F (36.4 C) 97.7 F (36.5 C) 98.2 F (36.8 C) 98.2 F (36.8 C)   TempSrc: Temporal Temporal Temporal Temporal   SpO2: 100% 100% 100% 100%   Weight:       Height:            Recent Labs     04/24/24  0510   WBC 10.9   HGB 8.8*   HCT 30.4*   MCV 100.0*   PLT 453*     Recent Labs     04/24/24  0510   NA 137   K 5.4*   CL 104   CO2 27   ANIONGAP 6   CALCIUM  9.4   BUN 31*   CREATININE 0.91   GLUCOSE 85     No results for input(s): LABALBU, TP, AST, ALKPHOS, BILITOT, BILIDIR, ALT, GLOB in the last 72 hours.    Invalid input(s): AGRAT  Lab Results   Component Value Date/Time    COLORU Yellow 04/15/2024 03:02 AM    CLARITYU Clear 04/15/2024 03:02 AM    GLUCOSEU Negative 04/15/2024 03:02 AM    BILIRUBINUR Negative 04/15/2024 03:02 AM    KETUA Negative 04/15/2024 03:02 AM    BLOODU Trace-lysed 04/15/2024 03:02 AM    PHUR 5.5 04/15/2024 03:02 AM    NITRU Negative 04/15/2024 03:02 AM    LEUKOCYTESUR Trace 04/15/2024 03:02 AM     No results for input(s): POCGLU in the last 72 hours.      Discussed with patient and family regarding management, prognosis, treatment and complications of  the patient's medical conditions in detail. All questions answered  to the satisfaction of those individual(s) who also verbalized understanding of and agreement with the assessment and plan.     Dr. [place], Garnette RAMAN, MD, thank you for allowing us  to participate in the care of this patient.  Please contact me through the hospital if questions arise.  Dragon medical dictation software was used for portions of this report. Unintended errors may occur.     Olam Campi D.O.  [facility]

## 2024-04-24 NOTE — Progress Notes (Signed)
 INFECTIOUS DISEASE FOLLOW UP NOTE     Date of admission: 04/14/2024     Date of consult: April 15, 2024        ABX:      Current abx Prior abx    Cefepime /Vanco 7/14-10  Flagyl  7/15-9        ASSESSMENT:       Right foot abscess  -Unclear cause of swelling and pain that has been present for several months  -Has been evaluated by orthopedic  -Patient seen by [facility] specialist Dr. Curtistine Emperor with last visit 03/11/2024 for right contusion of dorsum of foot, lower extremity edema and right foot pronation deformity  -03/08/2024 MRI right hindfoot no fracture.  Mild multifocal osseous edema?  Altered gait.  And mechanical stress-related changes.  Mild intrinsic foot muscular atrophic and intramuscular edema.  Subcu edema along the dorsum of the foot  -xray no fracture  -S/p I&D by ED pending cultures   - Patient seen by podiatrist Dr. 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  -Has been following with neurosurgery at [facility] artery aneurysm  -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 and following with [facility] neurosurgery with plan for continued management   Ax- PCN -as a child   Co-morb- COPD, schizoaffective disorder, HTN, depression,             RECOMMENDATIONS:       Complicated 73 year old female with significant comorbidities coming with bilateral feet swelling, right worse than left    Tmax 98.2, other VSS, 100% room air sat  Labs include creatinine 0.91 BS 85 WBC 10.9 Hgb 8.8 PLT 453  Micro 7/17 wound right foot Gram stain negative, culture NG, ANA NG, right heel bone Gram stain negative, culture negative, ANA NTD, 7/15 urine less than 10k, superficial wound culture negative, 7/14 blood culture x 2 negative  Imaging 7/21 IR angio  Path 7/17 right heel acute and chronic osteomyelitis       Assessment     Right foot abscess  - Coming with swelling, pain of right foot   - Understand I&D in ED by provider and cultures negative so far  - CT concerning for abscess on right foot  - 7/17 s/p OR with I&D and placement of wound VAC -or cultures negative so far   - Path suggestive of acute and chronic osteomyelitis   - 7/19 CT still showing multiple abscesses in the midfoot and subcu area  -Podiatry planning debridement of right ankle ulcer with graft application later this week  -Anticipate deep cultures, remains on BS ABX     Left foot swelling  - Tenderness on dorsal aspect  - 7/17 CT suggestive of cellulitis with focal area of collection?  Abscess and CTA 7/19 poorly defined collections foot and subcu left knee  -Podiatry planning I&D of left foot-anticipate deep cultures  - Covering  with broad-spectrum antibiotic     PAD  -Previous history of angio but recent ABI with monophasic waveforms in femoral and plan for angio per vascular  -Vascular study 7/16 moderate right iliac-femoral, severe left arterial insufficiency at femoropopliteal region  -s/p angio 7/21 RLE with right SFA intervention     Bilateral leg edema  - Recent PVL 7/8 was negative for DVT but low threshold to repeat     Leukocytosis likely reactive and monitor     AKI?  CKD  - Avoid nephrotoxic medications and dose for current creatinine clearance  - Renal ultrasound without any stone or obstruction    Other  details as above       Rec:  - Continue IV Vanco, cefepime  and Flagyl  for now  -Unclear duration on antibiotics at this point but right heel path showed osteomyelitis so likely extended if concern residual bone infection  - Appreciate podiatry plan for debridement right ankle ulcer and drainage of suspected abscess left foot tomorrow morning  -Local wound care per others  - Podiatry follow  - Vascular following  - Continue to monitor left knee with question left knee fluid collection on CTA  - Keep legs elevated on pillows  - Monitor serial labs  - Monitor for any ADE  - Remains at risk for worsening  - Discussed with nurse today    MICROBIOLOGY:      7/14 Blcx x2 NG              Wd cx NG  7/15 Ucx less than 10K UG F  7/17 anaerobic x 2 NG wound NOS, NG surgical wound NG right heel     LINES AND CATHETERS:      PIV     SUMMARY:      Susan [name] is a 73 year old African-American female with past medical history of COPD, schizoaffective disorder, hypertension, depression, PAD, peripheral neuropathy was admitted on 7/14 with worsening leg pain.     Patient is not a good historian and says lives with her daughter.  She has been almost bedbound for quite some time. Pt has been following with [facility] for basilar artery aneurysm and conservative Mx.  Patient also developed right leg swelling for which she was referred to Ortho and eventually podiatry.  It was not thought to be the part of her back leading to right leg swelling and neuropathy.  Patient also following with vascular and had procedure done in March with good runoff and did not think was concern for pain and swelling.  Patient last seen by podiatry 03/11/2024 and plan for conservative management.      She was seen in ED on 8 July with leg swelling.  Workup showed negative PVL for DVT and chest x-ray without any significant finding.  She was noted with pitting edema and was given some Lasix  and asked to follow-up with PCP for adjustment in medications.  Patient  returned 7/14 with worsening swelling of the legs, right worse than left.  Workup showed stable vitals, afebrile state.  WBC elevated at 19, Hb 9.8, PLT 354, BUN 34, CR 1.43, AST 17, ALT 14.  Blood cultures were obtained.  Patient had bilateral feet x-ray which showed no fracture or concern for osteo.  Chest x-ray was suggestive of left basilar opacity with developing pneumonia versus atelectasis.  Patient had I&D in the ED.  Podiatry was consulted and CT was ordered.  Wound culture in progress.  Patient on broad-spectrum antibiotic with cefepime  and Vanco.  ID was asked for further evaluation management.       SUBJECTIVE :     Interval notes reviewed.  Patient afebrile.  Patient reports she needs her feet, no ADR reported, interval notes seen, status post vascular intervention 7/21 right SFA with vascular planning further debridement wound VAC placement right ankle tomorrow and drainage suspected abscess left foot.     OBJECTIVE     BP (!) 117/59   Pulse 81   Temp 98.2 F (36.8 C) (Temporal)   Resp 18   Ht 1.702 m (5' 7)   Wt 81.6 kg (179 lb 14.3 oz)   SpO2 100%   BMI 28.18 kg/m     Temp (24hrs), Avg:97.8 F (36.6 C), Min:97.3 F (36.3 C), Max:98.2 F (36.8 C)    General: Fairly developed and nourished 73 y.o. year-old, female, chronically sick  HEENT: Normocephalic, anicteric sclerae, Pupils equal, round reactive to light, no oropharyngeal lesions. No sinus tenderness.  Neck: Supple, no lymphadenopathy, masses or thyromegaly  Chest: Symmetrical expansion  Lungs: Clear to auscultation bilaterally, no dullness  Heart: Regular rhythm, no murmur, no rub or gallop, No JVD  Abdomen: Soft, non-tender,non distended, no organomegaly, BS+  Musculoskeletal: Limited exam lower extremities today but pictures reviewed of right foot  CNS: AAOx3.  Follows simple commands.  SKIN: No obvious skin lesion or rash. Dry, warm, intact                   MEDICATIONS:     Current Facility-Administered Medications   Medication  Dose Route Frequency Provider Last Rate Last Admin    atorvastatin  (LIPITOR ) tablet 40 mg  40 mg Oral Nightly Maree Ladona HERO, MD   40 mg at 04/23/24 2011    aspirin  EC tablet 81 mg  81 mg Oral Daily Maree Ladona HERO, MD   81 mg at 04/24/24 0740    clopidogrel  (PLAVIX ) tablet 75 mg  75 mg Oral Daily Maree Ladona HERO, MD   75 mg at 04/24/24 0740    albuterol  (PROVENTIL ) (2.5 MG/3ML) 0.083% nebulizer solution 2.5 mg  2.5 mg Nebulization Q4H PRN Sahibzada, Ayaz, MD        ipratropium 0.5 mg-albuterol  2.5 mg (DUONEB ) nebulizer solution 1 Dose  1 Dose Inhalation Q4H PRN Sahibzada, Ayaz, MD        vancomycin  (VANCOCIN ) 1000 mg in sodium chloride  0.9% 250 mL IVPB  1,000 mg IntraVENous Q24H Matriano, John Paul H, MD   Stopped at 04/23/24 2345    senna (SENOKOT) tablet 8.6 mg  1 tablet Oral Daily PRN Matriano, Norleen Deward DEL, MD        docusate sodium  (COLACE) capsule 100 mg  100 mg Oral BID PRN Matriano, Norleen Deward DEL, MD        ondansetron  (ZOFRAN ) injection 4 mg  4 mg IntraVENous Q6H PRN Matriano, John Paul H, MD   4 mg at 04/21/24 1544    acetaminophen  (TYLENOL ) tablet 650 mg  650 mg Oral Q6H PRN Matriano, John Paul H, MD   650 mg at 04/18/24 1054    melatonin tablet 3 mg  3 mg Oral Nightly PRN Matriano, Norleen Deward DEL, MD        naloxone  (NARCAN ) injection 0.4 mg  0.4 mg IntraVENous PRN Matriano, Norleen Deward DEL, MD        glucagon  injection 1 mg  1 mg IntraMUSCular PRN Matriano, Norleen Deward DEL, MD        dextrose  50 % IV solution  20-30  mL IntraVENous PRN Murrell Norleen Deward VEAR, MD        heparin  (porcine) injection 5,000 Units  5,000 Units SubCUTAneous BID Sahibzada, Ayaz, MD   5,000 Units at 04/24/24 0740    benzonatate  (TESSALON ) capsule 100 mg  100 mg Oral TID PRN Murrell Norleen Deward VEAR, MD        cefepime  (MAXIPIME ) 1,000 mg in sodium chloride  0.9 % 50 mL IVPB (mini-bag)  1,000 mg IntraVENous Q12H Murrell Norleen Deward VEAR, MD   Stopped at 04/24/24 0402    *Pharmacy to Dose Vancomycin   1 each Other RX Placeholder Matriano, Norleen Deward VEAR, MD         morphine  (PF) injection 1 mg  1 mg IntraVENous Q3H PRN Murrell Norleen Deward VEAR, MD   1 mg at 04/23/24 0557    sodium hypochlorite (DAKINS) 0.125 % external solution   Irrigation Daily Anastasio Cain A, DPM        metroNIDAZOLE  (FLAGYL ) 500 mg in 0.9% NaCl 100 mL IVPB premix  500 mg IntraVENous Q8H Meade Viviane RAMAN, MD 100 mL/hr at 04/24/24 0741 500 mg at 04/24/24 0741    oxyCODONE -acetaminophen  (PERCOCET ) 7.5-325 MG per tablet 1 tablet  1 tablet Oral Q4H PRN Josepha Planas, DO   1 tablet at 04/24/24 9373         Labs: Results:   Chemistry Recent Labs     04/22/24  0510 04/23/24  0726 04/24/24  0510   NA 138 136  137 137   K 5.2* 5.0  5.0 5.4*   CL 104 104  104 104   CO2 28 27  27 27    BUN 15 27*  27* 31*      CBC w/Diff Recent Labs     04/24/24  0510   WBC 10.9   RBC 3.04*   HGB 8.8*   HCT 30.4*   PLT 453*          RADIOLOGY :        Imaging  IR ANGIOGRAM EXTREMITY RIGHT  Result Date: 04/21/2024  Radiology exam is complete. No Radiologist dictation. Please follow up with ordering provider.     CTA ABDOMINAL AORTA W BILAT RUNOFF W WO CONTRAST  Result Date: 04/19/2024  HISTORY: peripheral artery disease Technique: Noncontrast CT imaging is acquired through the chest for bolus tracking purposes. Enhanced CT imaging through the abdomen, pelvis, and lower extremities is performed during arterial phase of IV contrast menstruation as part of a CTA runoff. Angiographic reconstructions are performed formed at a separate workstation. The patient received an uneventful IV bolus of 100 mL of Omnipaque 350 nonionic contrast as part of the study. COMPARISON STUDY: none FINDINGS: Vascular structures and lower extremities: Abdominal aorta is normal in caliber and enhancement. Celiac, SMA, and renal arteries are moderately calcified yet patent with no high-grade stenosis. IMA is patent. Mild narrowing of the left common iliac artery. The right common iliac and external iliac arteries are patent. Hypogastric arteries are patent.  On the right, common femoral and profunda are patent. Diffuse mild disease of the right SFA with moderate grade narrowing in the mid SFA just proximal to the patent stent. The right popliteal artery is patent. Single-vessel runoff to the right foot through the peroneal artery. There is gradual diminished enhancement in the anterior and posterior tibial arteries. The ankle. Ulcerations are present in the right foot with subcutaneous gas and a vacuum. 2 cm abscess in the plantar subcutaneous tissues with an air-fluid level noted. Additional  2.4 cm abscess in the dorsal subcutaneous tissues of the mid foot noted with an air-fluid level. On the left, common femoral artery is mildly narrowed secondary to plaque and the profunda is patent. Diffuse moderate disease of the left SFA with partially calcified plaque. Popliteal artery is patent. Moderate grade stenosis at the proximal posterior tibial artery. Two-vessel runoff via the posterior tibial and peroneal arteries. Diminished enhancement noted in the distal anterior tibial artery. Along the lateral aspect of the midfoot there is a 2.5 x 4.6 cm area of low density which may represent a developing abscess or phlegmon. There is also a region of edema in the anterior left lower leg at the level of the ankle. At the level of the knee, there is a irregular shaped fluid collection in the anterolateral knee just lateral to the tibial tuberosity which measures approximately 4.2 cm in diameter. Bibasilar subsegmental atelectasis. Heart size is borderline enlarged. The liver has a slight nodular contour. The gallbladder is present and contains excreted contrast. The spleen, pancreas, and adrenal glands are within normal notes. 2.6 cm left renal cyst. Kidneys are otherwise normal in morphology with no hydronephrosis. Appendix and small bowel loops are unremarkable. Diverticulosis is noted throughout the colon. No free intraperitoneal fluid or air. Uterus contains a coarsely  calcified leiomyoma. No adnexal or pelvic mass. Urinary bladder is unremarkable. Osseous structures the pelvis and lumbar spine are intact. No compression deformities.     IMPRESSION: 1. No aortic inflow stenosis. 2. Moderate grade stenosis of the mid right SFA and patent SFA stent. Patent right popliteal artery with single vessel runoff to the right foot. 3. Diffuse moderate disease of the left SFA with patent popliteal artery and diseased two-vessel runoff to the left foot. Moderate grade stenosis in the proximal left posterior tibial artery. 4. Open wound in the right foot with subcutaneous gas and multiple abscesses in the mid foot along the plantar surface and dorsal subcutaneous tissues. 5. Poorly defined fluid collections in the left foot and in the subcutaneous tissues of the anterior left knee which are concerning for additional abscesses. Electronically signed by: Freda Lanes, MD 04/19/2024 2:45 PM EDT          Workstation ID: [id number]     CT FOOT LEFT W CONTRAST  Result Date: 04/17/2024  HISTORY: Swelling and pain. TECHNIQUE: Helical acquisition CT images through the left foot was performed with contrast with multiplanar reformats. COMPARISON: None. FINDINGS: No visualized acute fracture or dislocation. No evidence of periosteal reaction or bony destruction to suggest osteomyelitis. There is extensive subcutaneous fat edema and stranding within the lateral soft tissues from the level of the mid foot to the forefoot. In particular, there is a prominent area of focal fluid measuring approximately 3.8 x 2.1 cm on image 20, series 5 that extends superficially to abut the skin and may represent a developing abscess. There is no definite visualized enhancing wall.     IMPRESSION: 1. Findings are compatible with cellulitis of the lateral midfoot and forefoot with more focal 3.8 cm area of fluid at the level of the midfoot that is suspicious for an early developing abscess as there is no discrete capsule. 2.  No evidence of osteomyelitis. Electronically signed by: Lillia Hug, MD 04/17/2024 11:37 AM EDT          Workstation ID: [id number]     LE ARTERIAL  Result Date: 04/16/2024  Version: 1                                                                                                                                   Study ID: [id number]                                                       [facility]                                                       [address] [place], [place]  [address]                                                           Arterial Lower Extremity Report Name: MILAGRO, BELMARES Date: 04/15/2024, 2: 22 PM MRN: [id number]                                                               Patient Location: [facility] Age: 89 Years                                                               DOB: [birth year] (MM/DD/YYYY)  Gender: Female Performed By: Edilia Romelia Elden Sherrell, RVT                          Account #:: [id number] Ordering Physician: ANASTASIO CAIN A Reason For Study: Peripheral vascular disease.                                                     INTERPRETATION SUMMARY 1. Moderate right lower extremity arterial insufficiency at the level of the iliac-femoral region. 2. Severe left lower extremity arterial insufficiency at the level of the femoral-popliteal region. 3. Digital pressures deferred due to patient's pain. QUALITY/PROCEDURE Arterial Physio ABI [phone]). A bilateral lower extremity arterial exam was performed. Continuous wave Doppler tracings were recorded from the common femoral, popliteal, posterior tibial and dorsalis  pedis arteries bilaterally, and resting ankle brachial pressures were measured. Digital pressures deferred due to patient's pain. RIGHT LEG SEGMENTAL Brachial = 131 mmHg. Ankle Pressure (PT) = 81 mmHg. Ankle Pressure (DP) = 83 mmHg. Right ABI = 0.63. The lower extremity ankle brachial index is 0.63 on the right. The right lower extremity waveforms are monophasic in the femoral, the popliteal, the posterior tibial and the dorsalis pedis arteries. RIGHT DIGIT PPG Digit 1 flow is absent. LEFT LEG SEGMENTAL Brachial = 126 mmHg. Ankle (PT) = 59 mmHg. Ankle (DP) = 63 mmHg. Left ABI = 0.48. The lower extremity ankle brachial index is 0.48 on the left. The left lower extremity waveforms are multiphasic in the femoral, and monophasic in the popliteal, the posterior tibial and the dorsalis pedis arteries. LEFT DIGIT PPG Digit 1 flow is absent. ______________________________________________________________________________ Electronically signed by: Dr Blenda Fear, M.D                       04/16/2024, 10: 01 PM     US  RETROPERITONEAL COMPLETE  Result Date: 04/15/2024  INDICATION: Acute renal failure. COMPARISON: None. TECHNIQUE: Transabdominal grayscale and duplex sonography of the retroperitoneum performed. FINDINGS: RIGHT KIDNEY:  10.7 cm in length. (Normal range 9-13 cm) Normal renal parenchymal echogenicity. No obstruction, renal calculi or focal lesion. LEFT KIDNEY: 9.8 cm in length. (Normal range 9-13 cm) Normal renal parenchymal echogenicity.  And interpolar simple-appearing partially exophytic left renal cyst measures 2.6 x 2.6 x 2.1 cm. URINARY BLADDER: Bladder has a pre-void volume of 240.0 mL. (<50 mL WNL) Patient did not void for the exam. Neither ureteral jet(s) identified. No urinary bladder wall thickening or debris within the urinary bladder. (Normal wall thickness <3 mm distended, <5 mm underdistended.) Bladder contains a Foley catheter and is decompressed. PANCREAS:  Visualized pancreas is within normal limits.  IVC: Patent. ABDOMINAL AORTA: Normal caliber abdominal aorta. COMMON ILIAC ARTERIES: Suboptimally visualized secondary to interposed bowel gas.     IMPRESSION: 1.  No hydronephrosis. Kidneys unremarkable sonographically with the exception of a partially exophytic simple-appearing interpolar left renal cyst. 2.  Urinary bladder unremarkable sonographically. 3.  Common iliac arteries above the visualized sonographically due to interposed bowel gas Electronically signed by: Alm Conte, MD 04/15/2024 8:34 PM EDT          Workstation ID: [id number]     CT FOOT RIGHT W CONTRAST  Result Date: 04/15/2024  Indication: Right foot infection. Evaluate for abscess. Comparison: None available Technique: Computed tomographic imaging of the right  foot was performed following IV contrast administration. Coronal and sagittal reconstructions were created from the initial axial data set and submitted for interpretation. Findings: Complex gas and fluid containing abscess//phlegmon in the subcutaneous lateral hindfoot spanning approximately 4.4 cm craniocaudal by 2 x 1.5 cm transverse. This is positioned posterior to the peroneal tendons which are intact and not overtly involved. There is subtle loss of bony cortex/rarefaction at the lateral margin of the subjacent calcaneus concerning for osteomyelitis.     IMPRESSION: 1. Subcutaneous phlegmon/abscess lateral right hindfoot and 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 of the subjacent lateral margin of the calcaneus. Electronically signed by: Glade Reeve, MD 04/15/2024 2:16 PM EDT          Workstation ID: [id number]     XR FOOT RIGHT (2 VIEWS)  Result Date: 04/15/2024  Exam: 2 views of the right foot. Clinical indications:  PAIN. RULE OUT OSTEOMYELITIS. Right foot pain and swelling. Comparison:   04/15/2024;  Findings:  No fracture.  No dislocation. No radiopaque foreign bodies. No bone erosions.     Impression:  No fracture. No bone erosions to suggest osteomyelitis. Electronically signed by:  Celine Rear, MD 04/15/2024 12:52 AM EDT          Workstation ID: [id number]     XR FOOT LEFT (2 VIEWS)  Result Date: 04/15/2024  Exam: 2 views of the left foot. Clinical indications:  PAIN right foot. Swelling. Possible osteomyelitis. Comparison:  None Findings:  No fracture.  No dislocation. No bone erosions. No radiopaque foreign bodies.     Impression:  No fracture. Electronically signed by: Celine Rear, MD 04/15/2024 12:51 AM EDT          Workstation ID: [id number]     XR CHEST PORTABLE  Result Date: 04/15/2024  Exam: AP portable chest Clinical indication: SEPSIS Comparison: 04/08/2024;  Results:  Left basilar opacity. No pneumothorax. Heart normal.  Mediastinal contours are normal. No free air is seen under the hemidiaphragms.   Osseous structures intact.     IMPRESSION: Left basilar opacity. Developing pneumonia versus atelectasis. Electronically signed by: Celine Rear, MD 04/15/2024 12:41 AM EDT          Workstation ID: [id number]     VL DUP LOWER EXTREMITY VENOUS BILATERAL  Result Date: 04/09/2024                                                                                                                                    Version: 1  Study ID: [id number]                                                       [facility]                                                       [address] [place], [place]  [address]                                                      Lower Extremity Venous Duplex Report Name: BOOTS, MCGLOWN Date: 04/08/2024, 2: 17 PM MRN: [id number]                                                               Patient Location: [facility] Age: 64 Years                                                                DOB: Nov 16, 1950 (MM/DD/YYYY)                                                                               Gender: Female Performed By: Kylee R. Holloman, RVT                                       Account #:: [id number] Ordering Physician: [facility], [facility] Reason For Study: Lower extremity pain and swelling  INTERPRETATION SUMMARY 1. No evidence of deep vein thrombosis in the bilateral lower extremities. QUALITY/PROCEDURE Complete bilateral venous duplex performed. Quality of the study is good. Location: ER. RIGHT LEG The deep venous system of the right lower extremity was examined using duplex ultrasound. B-mode imaging demonstrated normal compressibility of the right common femoral, femoral, popliteal, posterior tibial and peroneal veins. Doppler flow signals were spontaneous and phasic at all levels. RIGHT SAPHENOUS VEINS Right great saphenous vein at the saphenofemoral junction demonstrates normal compressibility with spontaneous and phasic venous hemodynamics. LEFT LEG The deep venous system of the left lower extremity was examined using duplex ultrasound. B-mode imaging demonstrated normal compressibility of the left common femoral, femoral, popliteal, posterior tibial and peroneal veins. Doppler flow signals were spontaneous and phasic at all levels. LEFT LEG SAPHENOUS VEINS Left great saphenous vein at the saphenofemoral junction demonstrates normal compressibility with spontaneous and phasic venous hemodynamics. ______________________________________________________________________________ Electronically signed by: Dr Blenda Fear, M.D                       04/09/2024, 8: 57 AM     XR CHEST (2 VW)  Result Date: 04/08/2024  History: Shortness of breath Comparison: None Technique: PA and lateral radiographs of the chest were obtained. Two views, 2 exposures total. Findings: The lungs and pleural spaces are clear. Normal cardiomediastinal silhouette.  Degenerative changes of the thoracic spine.     IMPRESSION: No acute cardiopulmonary disease. Electronically signed by: Norleen Gleason 04/08/2024 11:20 AM EDT          Workstation ID: [id number]        I have independently reviewed lab studies and imgaing as well as review of nursing notes and physican notes from the past 24 hours.    Dragon medical dictation software was used for portions of this report. Unintended errors may occur.     Susan Julieanne Meadows, MD  April 24, 2024    [facility]   Office Phone:[phone]  Fax:[phone]

## 2024-04-24 NOTE — Progress Notes (Signed)
 Vascular surgery: S: + Moderate L & R  foot pain. Poor PO intake. O: VSS  Palpable popliteal pulse and good peroneal signal  A: PVD post: right SFA and above-knee popliteal orbital atherectomy (1.5 diamondback). #4 right SFA and above-knee popliteal balloon angioplasty 2.COPD P: debridement of right ankle ulcer with graft application and wound VAC application along with drainage of abscess on left foot for Friday by Podiatry.         Allyna Pittsley GLADSTONE HEATH, MD

## 2024-04-24 NOTE — Plan of Care (Signed)
 Problem: Respiratory - Adult  Goal: Achieves optimal ventilation and oxygenation  Outcome: Progressing     Problem: Pain  Goal: Verbalizes/displays adequate comfort level or baseline comfort level  Outcome: Progressing     Problem: Safety - Adult  Goal: Free from fall injury  Outcome: Progressing     Problem: Skin/Tissue Integrity  Goal: Skin integrity remains intact  Description: 1.  Monitor for areas of redness and/or skin breakdown  2.  Assess vascular access sites hourly  3.  Every 4-6 hours minimum:  Change oxygen saturation probe site  4.  Every 4-6 hours:  If on nasal continuous positive airway pressure, respiratory therapy assess nares and determine need for appliance change or resting period  Outcome: Progressing

## 2024-04-25 LAB — POC GLUCOSE, FINGERSTICK
POC Glucose: 158 mg/dL — ABNORMAL HIGH (ref 65–105)
POC Glucose: 229 mg/dL — ABNORMAL HIGH (ref 65–105)

## 2024-04-25 LAB — RENAL FUNCTION PANEL
Albumin: 1.9 g/dL — ABNORMAL LOW (ref 3.4–5.0)
Albumin: 1.8 g/dL — ABNORMAL LOW (ref 3.4–5.0)
Anion Gap: 3 mmol/L — ABNORMAL LOW (ref 5–15)
Anion Gap: 6 mmol/L (ref 5–15)
BUN: 31 mg/dL — ABNORMAL HIGH (ref 9–23)
BUN: 29 mg/dL — ABNORMAL HIGH (ref 9–23)
CO2: 30 meq/L (ref 20–31)
CO2: 28 meq/L (ref 20–31)
Calcium: 9.6 mg/dL (ref 8.7–10.4)
Calcium: 9.5 mg/dL (ref 8.7–10.4)
Chloride: 104 meq/L (ref 98–107)
Chloride: 103 meq/L (ref 98–107)
Creatinine: 0.88 mg/dL (ref 0.55–1.02)
Creatinine: 0.79 mg/dL (ref 0.55–1.02)
GFR African American: >60
GFR African American: >60
GFR Non-African American: >60
GFR Non-African American: >60
Glucose: 99 mg/dL (ref 74–106)
Glucose: 139 mg/dL — ABNORMAL HIGH (ref 74–106)
Phosphorus: 2.7 mg/dL (ref 2.4–5.1)
Phosphorus: 2 mg/dL — ABNORMAL LOW (ref 2.4–5.1)
Potassium: 5.4 meq/L — ABNORMAL HIGH (ref 3.5–5.1)
Potassium: 5.1 meq/L (ref 3.5–5.1)
Sodium: 137 meq/L (ref 136–145)
Sodium: 137 meq/L (ref 136–145)

## 2024-04-25 LAB — CBC WITH AUTO DIFFERENTIAL
Basophils: 0.4 % (ref 0–3)
Eosinophils: 1.6 % (ref 0–5)
Hematocrit: 28.2 % — ABNORMAL LOW (ref 35.0–47.0)
Hemoglobin: 8.2 g/dL — ABNORMAL LOW (ref 11.0–16.0)
Immature Granulocytes %: 0.9 % (ref 0.0–3.0)
Lymphocytes: 10 % — ABNORMAL LOW (ref 28–48)
MCH: 28.9 pg (ref 25.4–34.6)
MCHC: 29.1 g/dL — ABNORMAL LOW (ref 30.0–36.0)
MCV: 99.3 fL — ABNORMAL HIGH (ref 80.0–98.0)
MPV: 9.9 fL (ref 6.0–10.0)
Monocytes: 11.1 % (ref 1–13)
Neutrophils Segmented: 76 % — ABNORMAL HIGH (ref 34–64)
Nucleated RBCs: 0 (ref 0–0)
Platelets: 488 1000/mm3 — ABNORMAL HIGH (ref 140–450)
RBC: 2.84 M/uL — ABNORMAL LOW (ref 3.60–5.20)
RDW: 58.2 — ABNORMAL HIGH (ref 36.4–46.3)
WBC: 10.6 1000/mm3 (ref 4.0–11.0)

## 2024-04-25 LAB — POTASSIUM: Potassium: 5.1 meq/L (ref 3.5–5.1)

## 2024-04-25 MED ORDER — LIDOCAINE HCL 1 % IJ SOLN
1 | Freq: Once | INTRAMUSCULAR | Status: DC | PRN
Start: 2024-04-25 — End: 2024-04-25
  Administered 2024-04-25: 20:00:00 40 via INTRAVENOUS

## 2024-04-25 MED ORDER — MIDAZOLAM HCL 2 MG/2ML IJ SOLN
2 | Freq: Once | INTRAMUSCULAR | Status: DC | PRN
Start: 2024-04-25 — End: 2024-04-25
  Administered 2024-04-25: 19:00:00 2 via INTRAVENOUS

## 2024-04-25 MED ORDER — DEXTROSE 50 % IV SOLN
50 | INTRAVENOUS | Status: DC | PRN
Start: 2024-04-25 — End: 2024-05-02
  Administered 2024-04-25: 14:00:00 50 mL via INTRAVENOUS

## 2024-04-25 MED ORDER — SODIUM CHLORIDE 0.9 % IV SOLN (MINI-BAG)
0.9 | Freq: Three times a day (TID) | INTRAVENOUS | Status: DC
Start: 2024-04-25 — End: 2024-05-02
  Administered 2024-04-25 – 2024-05-02 (×22): 1000 mg via INTRAVENOUS

## 2024-04-25 MED ORDER — DEXTROSE-SODIUM CHLORIDE 5-0.9 % IV SOLN
5-0.9 | INTRAVENOUS | Status: DC
Start: 2024-04-25 — End: 2024-04-26
  Administered 2024-04-25 – 2024-04-26 (×2): via INTRAVENOUS

## 2024-04-25 MED ORDER — NORMAL SALINE FLUSH 0.9 % IV SOLN
0.9 | Freq: Two times a day (BID) | INTRAVENOUS | Status: DC
Start: 2024-04-25 — End: 2024-04-25

## 2024-04-25 MED ORDER — LIDOCAINE HCL 1 % IJ SOLN
1 | INTRAMUSCULAR | Status: AC
Start: 2024-04-25 — End: ?

## 2024-04-25 MED ORDER — LACTATED RINGERS IV SOLN
INTRAVENOUS | Status: DC
Start: 2024-04-25 — End: 2024-04-26
  Administered 2024-04-25 – 2024-04-26 (×3): via INTRAVENOUS

## 2024-04-25 MED ORDER — LIDOCAINE HCL (PF) 1 % IJ SOLN
1 | Freq: Once | INTRAMUSCULAR | Status: DC
Start: 2024-04-25 — End: 2024-05-02

## 2024-04-25 MED ORDER — MIDAZOLAM HCL 2 MG/2ML IJ SOLN
2 | INTRAMUSCULAR | Status: AC
Start: 2024-04-25 — End: ?

## 2024-04-25 MED ORDER — PROPOFOL 200 MG/20ML IV EMUL
200 | INTRAVENOUS | Status: AC
Start: 2024-04-25 — End: ?

## 2024-04-25 MED ORDER — VANCOMYCIN INTERMITTENT DOSING (PLACEHOLDER)
Freq: Once | INTRAVENOUS | Status: AC
Start: 2024-04-25 — End: 2024-04-26
  Administered 2024-04-27: 03:00:00 1

## 2024-04-25 MED ORDER — GLUCAGON (RDNA) 1 MG IJ KIT
1 | INTRAMUSCULAR | Status: DC | PRN
Start: 2024-04-25 — End: 2024-05-02

## 2024-04-25 MED ORDER — INSULIN GLARGINE 100 UNIT/ML SC SOLN
100 | Freq: Every evening | SUBCUTANEOUS | Status: DC
Start: 2024-04-25 — End: 2024-04-28
  Administered 2024-04-26: 05:00:00 10 [IU] via SUBCUTANEOUS
  Administered 2024-04-27: 03:00:00 5 [IU] via SUBCUTANEOUS

## 2024-04-25 MED ORDER — SODIUM CHLORIDE (PF) 0.9 % IJ SOLN
0.9 | INTRAMUSCULAR | Status: DC | PRN
Start: 2024-04-25 — End: 2024-04-25

## 2024-04-25 MED ORDER — SODIUM HYPOCHLORITE 0.25 % EX SOLN
0.25 | CUTANEOUS | Status: AC
Start: 2024-04-25 — End: 2024-04-25
  Administered 2024-04-25: 20:00:00

## 2024-04-25 MED ORDER — LIDOCAINE HCL 2 % IJ SOLN
2 | INTRAMUSCULAR | Status: AC
Start: 2024-04-25 — End: ?

## 2024-04-25 MED ORDER — HYDRALAZINE HCL 20 MG/ML IJ SOLN
20 | INTRAMUSCULAR | Status: DC | PRN
Start: 2024-04-25 — End: 2024-04-25

## 2024-04-25 MED ORDER — HYDROMORPHONE HCL 1 MG/ML IJ SOLN
1 | INTRAMUSCULAR | Status: DC | PRN
Start: 2024-04-25 — End: 2024-04-25

## 2024-04-25 MED ORDER — KETAMINE HCL 50 MG/ML IV SOSY
50 | Freq: Once | INTRAVENOUS | Status: DC | PRN
Start: 2024-04-25 — End: 2024-04-25
  Administered 2024-04-25 (×4): 10 via INTRAVENOUS

## 2024-04-25 MED ORDER — LACTATED RINGERS IV SOLN
INTRAVENOUS | Status: DC
Start: 2024-04-25 — End: 2024-04-25

## 2024-04-25 MED ORDER — DEXAMETHASONE SODIUM PHOSPHATE 20 MG/5ML IJ SOLN
20 | INTRAMUSCULAR | Status: AC
Start: 2024-04-25 — End: ?

## 2024-04-25 MED ORDER — LACTATED RINGERS IV SOLN
INTRAVENOUS | Status: DC | PRN
Start: 2024-04-25 — End: 2024-04-25
  Administered 2024-04-25: 19:00:00 via INTRAVENOUS

## 2024-04-25 MED ORDER — KETAMINE HCL 50 MG/ML IV SOSY
50 | INTRAVENOUS | Status: AC
Start: 2024-04-25 — End: ?

## 2024-04-25 MED ORDER — GLYCOPYRROLATE 0.2 MG/ML IJ SOLN
0.2 | INTRAMUSCULAR | Status: AC
Start: 2024-04-25 — End: ?

## 2024-04-25 MED ORDER — ROCURONIUM BROMIDE 50 MG/5ML IV SOLN
50 | INTRAVENOUS | Status: AC
Start: 2024-04-25 — End: ?

## 2024-04-25 MED ORDER — INSULIN LISPRO 100 UNIT/ML IJ SOLN
100 | INTRAMUSCULAR | Status: DC | PRN
Start: 2024-04-25 — End: 2024-04-28

## 2024-04-25 MED ORDER — BUPIVACAINE HCL (PF) 0.5 % IJ SOLN
0.5 | INTRAMUSCULAR | Status: AC
Start: 2024-04-25 — End: ?

## 2024-04-25 MED ORDER — DEXTROSE 50 % IV SOLN
50 | Freq: Once | INTRAVENOUS | Status: AC
Start: 2024-04-25 — End: 2024-04-25
  Administered 2024-04-25: 12:00:00 25 g via INTRAVENOUS

## 2024-04-25 MED ORDER — ONDANSETRON HCL 4 MG/2ML IJ SOLN
4 | Freq: Once | INTRAMUSCULAR | Status: DC | PRN
Start: 2024-04-25 — End: 2024-04-25

## 2024-04-25 MED ORDER — INSULIN LISPRO 100 UNIT/ML IJ SOLN
100 | Freq: Four times a day (QID) | INTRAMUSCULAR | Status: DC
Start: 2024-04-25 — End: 2024-04-28
  Administered 2024-04-25: 16:00:00 2 [IU] via SUBCUTANEOUS

## 2024-04-25 MED ORDER — PROPOFOL 200 MG/20ML IV EMUL
200 | INTRAVENOUS | Status: DC | PRN
Start: 2024-04-25 — End: 2024-04-25
  Administered 2024-04-25: 19:00:00 30 via INTRAVENOUS

## 2024-04-25 MED ORDER — ONDANSETRON HCL 4 MG/2ML IJ SOLN
4 | Freq: Once | INTRAMUSCULAR | Status: DC | PRN
Start: 2024-04-25 — End: 2024-04-25
  Administered 2024-04-25: 21:00:00 4 via INTRAVENOUS

## 2024-04-25 MED ORDER — LACTATED RINGERS IV SOLN
INTRAVENOUS | Status: DC
Start: 2024-04-25 — End: 2024-04-25
  Administered 2024-04-25: 18:00:00 via INTRAVENOUS

## 2024-04-25 MED ORDER — SODIUM CHLORIDE 0.9 % IV SOLN
0.9 | INTRAVENOUS | Status: DC | PRN
Start: 2024-04-25 — End: 2024-04-25

## 2024-04-25 MED ORDER — ALBUTEROL SULFATE 1.25 MG/3ML IN NEBU
1.25 | Freq: Once | RESPIRATORY_TRACT | Status: AC
Start: 2024-04-25 — End: 2024-04-25
  Administered 2024-04-25: 13:00:00 1.25 mg via RESPIRATORY_TRACT

## 2024-04-25 MED ORDER — LIDOCAINE HCL 1 % IJ SOLN
1 | Freq: Once | INTRAMUSCULAR | Status: DC | PRN
Start: 2024-04-25 — End: 2024-04-25

## 2024-04-25 MED ORDER — FENTANYL CITRATE (PF) 100 MCG/2ML IJ SOLN
100 | INTRAMUSCULAR | Status: DC | PRN
Start: 2024-04-25 — End: 2024-04-25
  Administered 2024-04-25 (×4): 25 ug via INTRAVENOUS

## 2024-04-25 MED ORDER — SUCCINYLCHOLINE CHLORIDE 20 MG/ML IJ SOLN
20 | INTRAMUSCULAR | Status: AC
Start: 2024-04-25 — End: ?

## 2024-04-25 MED ORDER — NORMAL SALINE FLUSH 0.9 % IV SOLN
0.9 | INTRAVENOUS | Status: DC | PRN
Start: 2024-04-25 — End: 2024-04-25

## 2024-04-25 MED ORDER — PATIROMER SORBITEX CALCIUM 8.4 G PO PACK
8.4 | Freq: Every day | ORAL | Status: DC
Start: 2024-04-25 — End: 2024-04-26
  Administered 2024-04-25 – 2024-04-26 (×2): 8.4 g via ORAL

## 2024-04-25 MED ORDER — GLYCOPYRROLATE 0.2 MG/ML IJ SOLN
0.2 | Freq: Once | INTRAMUSCULAR | Status: DC | PRN
Start: 2024-04-25 — End: 2024-04-25
  Administered 2024-04-25: 19:00:00 .1 via INTRAVENOUS

## 2024-04-25 MED ORDER — SODIUM POLYSTYRENE SULFONATE 15 GM/60ML PO SUSP
15 | Freq: Once | ORAL | Status: AC
Start: 2024-04-25 — End: 2024-04-25
  Administered 2024-04-25: 14:00:00 15 g via ORAL

## 2024-04-25 MED ORDER — SODIUM POLYSTYRENE SULFONATE PO POWD
Freq: Once | ORAL | Status: DC
Start: 2024-04-25 — End: 2024-04-25

## 2024-04-25 MED ORDER — LIDOCAINE HCL 1 % IJ SOLN
1 | INTRAMUSCULAR | Status: DC | PRN
Start: 2024-04-25 — End: 2024-04-25
  Administered 2024-04-25 (×2): 15 via SUBCUTANEOUS

## 2024-04-25 MED FILL — METRONIDAZOLE 500 MG/100ML IV SOLN: 500 MG/100ML | INTRAVENOUS | Qty: 100 | Fill #0

## 2024-04-25 MED FILL — MARCAINE PRESERVATIVE FREE 0.5 % IJ SOLN: 0.5 % | INTRAMUSCULAR | Qty: 30 | Fill #0

## 2024-04-25 MED FILL — MIDAZOLAM HCL 2 MG/2ML IJ SOLN: 2 mg/mL | INTRAMUSCULAR | Qty: 2 | Fill #0

## 2024-04-25 MED FILL — CEFEPIME HCL 1 G IJ SOLR: 1 g | INTRAMUSCULAR | Qty: 1000 | Fill #0

## 2024-04-25 MED FILL — KETAMINE HCL 50 MG/ML IV SOSY: 50 mg/mL | INTRAVENOUS | Qty: 1 | Fill #0

## 2024-04-25 MED FILL — OXYCODONE-ACETAMINOPHEN 7.5-325 MG PO TABS: 7.5-325 mg | ORAL | Qty: 1 | Fill #0

## 2024-04-25 MED FILL — LIDOCAINE HCL 1 % IJ SOLN: 1 % | INTRAMUSCULAR | Qty: 20 | Fill #0

## 2024-04-25 MED FILL — DEXAMETHASONE SODIUM PHOSPHATE 20 MG/5ML IJ SOLN: 20 MG/5ML | INTRAMUSCULAR | Qty: 5 | Fill #0

## 2024-04-25 MED FILL — XYLOCAINE 2 % IJ SOLN: 2 % | INTRAMUSCULAR | Qty: 20 | Fill #0

## 2024-04-25 MED FILL — SUCCINYLCHOLINE CHLORIDE 20 MG/ML IJ SOLN: 20 mg/mL | INTRAMUSCULAR | Qty: 10 | Fill #0

## 2024-04-25 MED FILL — LIDOCAINE HCL 1 % IJ SOLN: 1 % | INTRAMUSCULAR | Qty: 1 | Fill #0

## 2024-04-25 MED FILL — VELTASSA 8.4 G PO PACK: 8.4 g | ORAL | Qty: 1 | Fill #0

## 2024-04-25 MED FILL — DEXTROSE 50 % IV SOLN: 50 % | INTRAVENOUS | Qty: 50 | Fill #0

## 2024-04-25 MED FILL — ADMELOG 100 UNIT/ML IJ SOLN: 100 [IU]/mL | INTRAMUSCULAR | Qty: 1 | Fill #0

## 2024-04-25 MED FILL — DIPRIVAN 200 MG/20ML IV EMUL: 200 MG/20ML | INTRAVENOUS | Qty: 20 | Fill #0

## 2024-04-25 MED FILL — LACTATED RINGERS IV SOLN: INTRAVENOUS | Qty: 1000 | Fill #0

## 2024-04-25 MED FILL — ROCURONIUM BROMIDE 50 MG/5ML IV SOLN: 50 MG/5ML | INTRAVENOUS | Qty: 5 | Fill #0

## 2024-04-25 MED FILL — ALBUTEROL SULFATE 1.25 MG/3ML IN NEBU: 1.25 MG/3ML | RESPIRATORY_TRACT | Qty: 3 | Fill #0

## 2024-04-25 MED FILL — DEXTROSE-SODIUM CHLORIDE 5-0.9 % IV SOLN: 5-0.9 % | INTRAVENOUS | Qty: 1000 | Fill #0

## 2024-04-25 MED FILL — HYSEPT 25 0.25 % EX SOLN: 0.25 % | CUTANEOUS | Qty: 473 | Fill #0

## 2024-04-25 MED FILL — SODIUM POLYSTYRENE SULFONATE 15 GM/60ML PO SUSP: 15 GM/60ML | ORAL | Qty: 60 | Fill #0

## 2024-04-25 MED FILL — SODIUM POLYSTYRENE SULFONATE PO POWD: ORAL | Qty: 15 | Fill #0

## 2024-04-25 MED FILL — FENTANYL CITRATE (PF) 100 MCG/2ML IJ SOLN: 100 MCG/2ML | INTRAMUSCULAR | Qty: 2 | Fill #0

## 2024-04-25 MED FILL — XYLOCAINE-MPF 1 % IJ SOLN: 1 % | INTRAMUSCULAR | Qty: 5 | Fill #0

## 2024-04-25 MED FILL — GLYCOPYRROLATE 0.2 MG/ML IJ SOLN: 0.2 mg/mL | INTRAMUSCULAR | Qty: 1 | Fill #0

## 2024-04-25 NOTE — Progress Notes (Signed)
 Attempted to take patient to OR this morning. Case rescheduled due to elevated potassium.  Will try to reschedule later today if medically cleared.   Keep NPO at this time

## 2024-04-25 NOTE — Brief Op Note (Signed)
 Brief Postoperative Note      Patient: Susan [name]  Date of Birth: [birth year]  MRN: [id number]    Date of Procedure: 2024-05-02    Pre-Op Diagnosis Codes:      * Foot abscess, right [L02.611]    Post-Op Diagnosis: Post-Op Diagnosis Codes:     * Foot abscess, right [L02.611]       Procedure(s):  DEBRIDEMENT RIGHT FOOT WITH GRAFT APPLICATION; APPLICATION OF WOUND VAC;  DRAINAGE OF ABSCESS LEFT FOOT    Surgeon(s):  Rosamond Inches, DPM    Assistant:  Surgical Assistant: Lamm, Alyson    Anesthesia: Monitor Anesthesia Care    Estimated Blood Loss (mL): Minimal    Complications: None    Specimens:   ID Type Source Tests Collected by Time Destination   1 : LEFT FOOT ABCESS Swab Foot CULTURE, ANAEROBIC, CULTURE, WOUND LELON VONNE MOZELLA Rosamond Inches, DPM 05-02-2024 1634        Implants:  Implant Name Type Inv. Item Serial No. Manufacturer Lot No. LRB No. Used Action   TISSUE DERMACELL 6X10CM 0.2-1MM THK 60SQCM - [id number] Tissue Expander TISSUE DERMACELL 6X10CM 0.2-1MM THK 60SQCM [id number] [employer]  Left 1 Implanted   TISSUE STRAVIX 2X4CM 8SQCM - [id number] Tissue Expander TISSUE STRAVIX 2X4CM 8SQCM  [employer] [id number] Right 1 Implanted         Drains:   Negative Pressure Wound Therapy Right (Active)   Dressing Status Removed (comment # of pieces) 04/22/24 0800   Canister changed? No 04/22/24 1955   Output (ml) 160 ml 04/22/24 1955   Mode Continuous 04/22/24 1955   Target Pressure (mmHg) 125 04/22/24 1955   $$ Dressing Changed & Charged $ Disposable NPWT less than or equal to 50 sq cm PER TX 04/22/24 1955   Dressing Type Black foam 04/21/24 1936       External Urinary Catheter (Active)   Site Assessment Clean,dry & intact 05-02-2024 1340   Placement Repositioned 02-May-2024 1340   Securement Method Securing device (Describe) 05/02/24 1340   Catheter Care Catheter/Wick replaced 2024/05/02 0705   Perineal Care Yes 05-02-24 0705   Suction 40 mmgHg continuous 05-02-2024 1340   Urine Color Yellow 05/02/24 0705   Urine  Appearance Clear May 02, 2024 0705   Output (mL) 100 mL 05/02/2024 9380       Findings:  Present At Time Of Surgery (PATOS) (choose all levels that have infection present):  - Superficial Infection (skin/subcutaneous) present as evidenced by fluid consistent with infection  Other Findings:     Electronically signed by INCHES ROSAMOND, DPM on 05/02/2024 at 4:37 PM

## 2024-04-25 NOTE — Progress Notes (Signed)
 Chaplain Services  Unable to Visit Patient    Start Time: 1255  End Time: 1300    Volunteer Chaplain attempted to conduct a consultation and Spiritual Assessment for Susan [name], who is a 73 y.o.,female.  According to the patient's EMR Religious Affiliation is: None.     Patient is asleep, and is not available to be assessed at this time.  Offered prayer remotely on patient's behalf.     Assessment:  Patient has no known religious/cultural needs that will affect patient's preferences in health care.  Patient has no known spiritual or religious issues which require intervention at this time.     Plan:  Chaplains will continue to follow and will provide pastoral care on an as needed/requested basis.  Volunteer Chaplain recommends bedside caregivers page Duty Chaplain if patient shows signs of acute spiritual or emotional distress.   Chaplain charted for [employer].    Sonny Hoit   Chaplain   Spiritual Care  [phone]

## 2024-04-25 NOTE — Op Note (Signed)
 Operative Note      Patient: Susan [name]  Date of Birth: 05-01-1951  MRN: [id number]    Date of Procedure: Apr 27, 2024    Pre-Op Diagnosis Codes:      * Foot abscess, right [L02.611]    Post-Op Diagnosis: Post-Op Diagnosis Codes:     * Foot abscess, right [L02.611]       Procedure(s):  DEBRIDEMENT RIGHT FOOT WITH GRAFT APPLICATION; APPLICATION OF WOUND VAC;  DRAINAGE OF ABSCESS LEFT FOOT    Surgeon(s):  Rosamond Inches, DPM    Assistant:   Surgical Assistant: Lamm, Alyson    First Assistant Tasks:  Surgical Assistant assisted with integral tasks during the surgery including but not limited to positioning and tissue retraction.    Anesthesia: Monitor Anesthesia Care    Estimated Blood Loss (mL): less than 50     Complications: None    Specimens:   ID Type Source Tests Collected by Time Destination   1 : LEFT FOOT ABCESS Swab Foot CULTURE, ANAEROBIC, CULTURE, WOUND W VONNE MOZELLA Rosamond Inches, DPM 04/27/2024 1634        Implants:  Implant Name Type Inv. Item Serial No. Manufacturer Lot No. LRB No. Used Action   TISSUE DERMACELL 6X10CM 0.2-1MM THK 60SQCM - [id number] Tissue Expander TISSUE DERMACELL 6X10CM 0.2-1MM THK 60SQCM [id number] [employer]  Left 1 Implanted   TISSUE STRAVIX 2X4CM 8SQCM - [id number] Tissue Expander TISSUE STRAVIX 2X4CM 8SQCM 24001 [employer]CR [id number] Right 1 Implanted   TISSUE STRAVIX 3X6CM 12SQCM - [id number] Tissue Expander TISSUE STRAVIX 3X6CM 12SQCM 36006 [employer]CR [id number] Right 1 Implanted   TISSUE STRAVIX 2X4CM 8SQCM - [id number] Tissue Expander TISSUE STRAVIX 2X4CM 8SQCM 24001 [employer]CR [id number] Right 1 Implanted   TISSUE STRAVIX 3X6CM 12SQCM - [id number] Tissue Expander TISSUE STRAVIX 3X6CM 12SQCM 36004 [employer] [id number] Right 1 Implanted         Drains:   Negative Pressure Wound Therapy Right (Active)   Dressing Status Intact w/seal 05/02/24 0800   Canister changed? No 05/02/24 0800   Output (ml) 160 ml 04/22/24 1955   Mode Continuous 05/02/24 0800   Target  Pressure (mmHg) 125 05/02/24 0800   $$ Dressing Changed & Charged $ Disposable NPWT less than or equal to 50 sq cm PER TX 04/22/24 1955   Dressing Type Black foam 05/01/24 0800       External Urinary Catheter (Active)   Site Assessment Clean,dry & intact 04/29/24 1600   Placement Replaced 05/01/24 0529   Securement Method Securing device (Describe) 04/29/24 1600   Catheter Care Catheter/Wick replaced 04-27-2024 0705   Perineal Care Yes 04/29/24 1600   Suction 40 mmgHg continuous 04/29/24 1600   Urine Color Yellow 04/29/24 1600   Urine Appearance Clear 04/29/24 1600   Urine Odor Malodorous 04/29/24 1600   Output (mL) 200 mL 05/02/24 0439       Findings:  Infection Present At Time Of Surgery (PATOS) (choose all levels that have infection present):  - Deep Infection (muscle/fascia) present as evidenced by fluid consistent with infection    Detailed Description of Procedure:   On 04/27/24 the patient was identified in the pre-operative holding area. With the assistance of the patient my initials were placed on the patients right and left lower extremity. The patient was then taken to the OR . Anesthesia was administered and the patients right and left lower extremity was then prepped and draped in a standard fashion.  A timeout was  performed to verify all pertinent safety measures and verified with the patients bracelet and chart.     Attention was directed to patient's left lower extremity where an abscess was incised and drained. Cultures obtained during drainage. Pulse lavage utilized to irrigate abscess which was to the level of tendon.   After 2L of lavage, the wound which was measuring about 9 cm x 6 cm was noted to be clean with healthy granular tissue. A dermacell graft was then applied over the wound and stapled onto the left foot. An adaptic was applied over the graft and dressed with 4x4 gauze, kirlex and ACE.     Attention was then directed to patient's right lower extremity where the ulcer measuring 10 cm x  6 cm x 2 cm was noted to level of muscle without purulence.   Area was debrided using stereil #15 blade and prepped for graft application down to health y granuilar tissue.   At this time, stravix was applied over the wound and stapled onto the wound bed. Adaptic was applied.   Wound vac was applied over the adaptic to 125 mmHG continuous. Area was dressed with gauze and ACE.     Patient handled the procedure well and vital signs stable. I was available in PACU to answer any questions.     Electronically signed by ARTHOR FEAR, DPM on 05/02/2024 at 12:46 PM

## 2024-04-25 NOTE — Other (Signed)
 04/25/24 1730   Family Communication   Contact Person Relationship to Patient Daughter   Contact Person Phone Number Teddie [phone]  (left message)   Family/Significant Other Update Called   Delivery Origin Nurse   Update Given Yes   Family Communication   Family Update Message Patient stable

## 2024-04-25 NOTE — Anesthesia Post-Procedure Evaluation (Signed)
 Department of Anesthesiology  Postprocedure Note    Patient: Susan [name]  MRN: [id number]  Birthdate: 28-Jan-1951  Date of evaluation: 04/25/2024    Procedure Summary       Date: 04/25/24 Room / Location: [facility] MAIN 11 / [facility] MAIN OR    Anesthesia Start: 1518 Anesthesia Stop: 1646    Procedure: DEBRIDEMENT RIGHT FOOT WITH GRAFT APPLICATION; APPLICATION OF WOUND VAC;  DRAINAGE OF ABSCESS LEFT FOOT (Bilateral: Foot) Diagnosis:       Foot abscess, right      (Foot abscess, right [L02.611])    Surgeons: Rosamond Inches, DPM Responsible Provider: Anne Denman LABOR, MD    Anesthesia Type: MAC ASA Status: 3            Anesthesia Type: MAC    Aldrete Phase I: Aldrete Score: 9    Aldrete Phase II: Aldrete Score: 9    Anesthesia Post Evaluation    Patient location during evaluation: PACU  Patient participation: complete - patient participated  Level of consciousness: awake and alert  Airway patency: patent  Nausea & Vomiting: no nausea and no vomiting  Cardiovascular status: hemodynamically stable  Respiratory status: acceptable  Hydration status: stable  Pain management: adequate    No notable events documented.

## 2024-04-25 NOTE — Other (Addendum)
 Patient surgery rescheduled due to potassium level=5.4 Holli.Report given to 5W/RN c/o Nat.Pt. transferred to floor.

## 2024-04-25 NOTE — Other (Signed)
 TRANSFER - IN REPORT:    Verbal report received from 5 WEST/RN on Susan [name]   for ordered procedure      Report consisted of patient's Situation, Background, Assessment and   Recommendations(SBAR).     Information from the following report(s) Pre Procedure Checklist, Procedure Verification, Quality Measures, and Neuro Assessment was reviewed with the receiving nurse.

## 2024-04-25 NOTE — Other (Signed)
 TRANSFER - OUT REPORT:    Verbal report given to Elyn on Susan [name]  being transferred to 5212 for routine post-op       Report consisted of patient's Situation, Background, Assessment and   Recommendations(SBAR).     Information from the following report(s) Nurse Handoff Report, Adult Overview, Surgery Report, Intake/Output, MAR, and Neuro Assessment was reviewed with the receiving nurse.           Lines:   Peripheral IV 04/25/24 Proximal;Right;Anterior Forearm (Active)   Site Assessment Clean, dry & intact 04/25/24 1641   Line Status Infusing 04/25/24 1641   Line Care Connections checked and tightened 04/25/24 1339   Phlebitis Assessment No symptoms 04/25/24 1641   Infiltration Assessment 0 04/25/24 1641   Alcohol Cap Used Yes 04/25/24 1339   Dressing Status Clean, dry & intact 04/25/24 1641   Dressing Type Transparent 04/25/24 1641       Peripheral IV 04/25/24 Posterior;Right Wrist (Active)   Site Assessment Clean, dry & intact 04/25/24 1641   Line Status Infusing 04/25/24 1641   Phlebitis Assessment No symptoms 04/25/24 1641   Infiltration Assessment 0 04/25/24 1641   Dressing Status Clean, dry & intact 04/25/24 1641   Dressing Type Transparent 04/25/24 1641        Opportunity for questions and clarification was provided.      Patient transported with:  [employer]

## 2024-04-25 NOTE — Progress Notes (Addendum)
 INFECTIOUS DISEASE FOLLOW UP NOTE     Date of admission: 04/14/2024     Date of consult: April 15, 2024        ABX:      Current abx Prior abx    Cefepime /Vanco 7/14-11  Flagyl  7/15-10        ASSESSMENT:       Right foot abscess  -Unclear cause of swelling and pain that has been present for several months  -Has been evaluated by orthopedic  -Patient seen by [facility] specialist Dr. Curtistine Emperor with last visit 03/11/2024 for right contusion of dorsum of foot, lower extremity edema and right foot pronation deformity  -03/08/2024 MRI right hindfoot no fracture.  Mild multifocal osseous edema?  Altered gait.  And mechanical stress-related changes.  Mild intrinsic foot muscular atrophic and intramuscular edema.  Subcu edema along the dorsum of the foot  -xray no fracture  -S/p I&D by ED pending cultures   - Patient seen by podiatrist Dr. Roberts, OR I and D done 7/17, cultures unrevealing, path consistent with acute on chronic osteomyelitis     Left foot swelling  -Xray no fracture but CT concerning for abscess   Leucocytosis POA   AKI/CKD  -Abnormal creatinine few weeks ago on routine blood work at [facility]   PAD  -Patient has been following with vascular surgeon and last seen in office on 04/08/2024  -12/2023 ABI at [facility] with severe left lower extremity arterial insufficiency at rest and right first digital disease with known proximal arterial insufficiency  -12/2023 s/p right lower extremity angiogram showed single-vessel peroneal runoff with good branches   Ch hypoxic resp failure   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  -Has been following with neurosurgery at [facility] artery aneurysm  -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 and following with [facility] neurosurgery with plan for  continued management   Ax- PCN -as a child   Co-morb- COPD, schizoaffective disorder, HTN, depression,             RECOMMENDATIONS:      Complicated 73 year old female with significant comorbidities coming with bilateral feet swelling, right worse than left    Tmax 99, other VSS, 3 L nasal cannula 100% O2 sat  Labs today creatinine 0.88 BS 158 potassium unchanged 5.4, WBC 10.6 Hgb 8.2 PLT 488  Micro 7/17 wound right foot Gram stain negative, culture NG, ANA NG, right heel bone Gram stain negative, culture negative, ANA NTD, 7/15 urine less than 10k, superficial wound culture negative, 7/14 blood culture x 2 negative  Imaging 7/21 IR angio  Path 7/17 right heel acute and chronic osteomyelitis       Assessment     Right foot abscess  - Coming with swelling, pain of right foot   - Understand I&D in ED by provider and cultures negative so far  - CT concerning for abscess on right foot  - 7/17 s/p OR with I&D and placement of wound VAC -or cultures negative so far   - Path suggestive of acute and chronic osteomyelitis   - 7/19 CT still showing multiple abscesses in the midfoot and subcu area  -Podiatry planning debridement of right ankle ulcer with graft application today, but understand canceled due to potassium 5.4, may retry later today if cleared by medicine  -Anticipate deep cultures, remains on BS ABX     Left foot swelling  - Tenderness on dorsal  aspect  - 7/17 CT suggestive of cellulitis with focal area of collection?  Abscess and CTA 7/19 poorly defined collections foot and subcu left knee  -Podiatry planning I&D of left foot-anticipate deep cultures  - Covering with broad-spectrum antibiotic     PAD  -Previous history of angio but recent ABI with monophasic waveforms in femoral and plan for angio per vascular  -Vascular study 7/16 moderate right iliac-femoral, severe left arterial insufficiency at femoropopliteal region  -s/p angio 7/21 RLE with right SFA intervention     Bilateral leg edema  - Recent PVL 7/8  was negative for DVT but low threshold to repeat     Leukocytosis likely reactive and monitor     AKI?  CKD  - Avoid nephrotoxic medications and dose for current creatinine clearance  - Renal ultrasound without any stone or obstruction    Other details as above       Rec:  - Continue IV Vanco, cefepime  and Flagyl  for now  -Unclear duration on antibiotics at this point but right heel path showed osteomyelitis so likely extended if concern residual bone infection post repeat I&D  - Appreciate podiatry plan for debridement right ankle ulcer and drainage of suspected abscess left foot-understand procedure canceled due to potassium elevation, appreciate Dr. Rosamond note indicating attempt to reschedule later today if medically cleared, defer attending  -Local wound care per others  - Podiatry follow up  - Vascular following  - Continue to monitor left knee with question left knee fluid collection on CTA  - Keep legs elevated on pillows  - Monitor serial labs  - Monitor for any ADE  - Remains at risk for worsening    We will see patient again on Monday.  Dr. Ann Olden will be rounding for ID this weekend.  Please call her if any urgent question or concern.  She can be reached at  [phone].      MICROBIOLOGY:      7/14 Blcx x2 NG              Wd cx NG  7/15 Ucx less than 10K UG F  7/17 anaerobic x 2 NG wound NOS, NG surgical wound NG right heel     LINES AND CATHETERS:      PIV     SUMMARY:      Susan [name] is a 73 year old African-American female with past medical history of COPD, schizoaffective disorder, hypertension, depression, PAD, peripheral neuropathy was admitted on 7/14 with worsening leg pain.     Patient is not a good historian and says lives with her daughter.  She has been almost bedbound for quite some time. Pt has been following with [facility] for basilar artery aneurysm and conservative Mx.  Patient also developed right leg swelling for which she was referred to Ortho and eventually podiatry.  It was not  thought to be the part of her back leading to right leg swelling and neuropathy.  Patient also following with vascular and had procedure done in March with good runoff and did not think was concern for pain and swelling.  Patient last seen by podiatry 03/11/2024 and plan for conservative management.      She was seen in ED on 8 July with leg swelling.  Workup showed negative PVL for DVT and chest x-ray without any significant finding.  She was noted with pitting edema and was given some Lasix  and asked to follow-up with PCP for adjustment in medications.  Patient returned 7/14  with worsening swelling of the legs, right worse than left.  Workup showed stable vitals, afebrile state.  WBC elevated at 19, Hb 9.8, PLT 354, BUN 34, CR 1.43, AST 17, ALT 14.  Blood cultures were obtained.  Patient had bilateral feet x-ray which showed no fracture or concern for osteo.  Chest x-ray was suggestive of left basilar opacity with developing pneumonia versus atelectasis.  Patient had I&D in the ED.  Podiatry was consulted and CT was ordered.  Wound culture in progress.  Patient on broad-spectrum antibiotic with cefepime  and Vanco.  ID was asked for further evaluation management.       SUBJECTIVE :     Interval notes reviewed.  Understand planned OR debridement right heel and aspiration left ankle canceled this morning due to elevated potassium, patient not sure when surgery to be rescheduled.  Denied fevers chills rash nausea vomiting diarrhea.  Feet remained painful right greater than left  OBJECTIVE     BP (!) 144/77   Pulse 77   Temp 99 F (37.2 C) (Temporal)   Resp 20   Ht 1.702 m (5' 7)   Wt 81.6 kg (179 lb 14.3 oz)   SpO2 100%   BMI 28.18 kg/m     Temp (24hrs), Avg:98 F (36.7 C), Min:97.2 F (36.2 C), Max:99 F (37.2 C)    General: Fairly developed and nourished 73 y.o. year-old, female, chronically sick  HEENT: Normocephalic, anicteric sclerae, Pupils equal, round reactive to light, no oropharyngeal lesions. No  sinus tenderness.  Neck: Supple, no lymphadenopathy, masses or thyromegaly  Chest: Symmetrical expansion  Lungs: Clear to auscultation bilaterally, no dullness  Heart: Regular rhythm, no murmur, no rub or gallop, No JVD  Abdomen: Soft, non-tender,non distended, no organomegaly, BS+  Musculoskeletal: Limited exam lower extremities today but pictures reviewed of right foot from 7/23, dressing wound VAC in place.  Left lateral dorsal forefoot with suspected large fluid collection  CNS: AAOx3.  Follows simple commands.  SKIN: No obvious skin lesion or rash. Dry, warm, intact                   MEDICATIONS:     Current Facility-Administered Medications   Medication Dose Route Frequency Provider Last Rate Last Admin    lidocaine  PF 1 % injection 1 mL  1 mL IntraDERmal Once Vyas, Shruti, DPM        lidocaine  1 % injection 1 mL  1 mL IntraDERmal Once PRN Cleotilde, Kory A, DO        sodium polystyrene (KAYEXALATE ) powder 15 g  15 g Oral Once Josepha Planas, DO        dextrose  5 % and 0.9 % sodium chloride  infusion   IntraVENous Continuous Josepha Planas, DO        dextrose  50 % IV solution  25 g IntraVENous Once Josepha Planas, DO        dextrose  50 % IV solution  10-50 mL IntraVENous PRN Josepha Planas, DO        glucagon  injection 1 mg  1 mg IntraMUSCular PRN Josepha Planas, DO        insulin  glargine (LANTUS ) injection vial 1-100 Units  1-100 Units SubCUTAneous QHS Josepha Planas, DO        insulin  lispro (HUMALOG ,ADMELOG ) injection vial 1-100 Units  1-100 Units SubCUTAneous 4x Daily AC & HS Josepha Planas, DO        insulin  lispro (HUMALOG ,ADMELOG ) injection vial 1-100 Units  1-100 Units SubCUTAneous PRN Josepha Planas, DO  albuterol  (ACCUNEB ) nebulizer solution 1.25 mg  1.25 mg Nebulization Once Josepha Planas, DO        atorvastatin  (LIPITOR ) tablet 40 mg  40 mg Oral Nightly Maree Ladona HERO, MD   40 mg at 04/24/24 2044    aspirin  EC tablet 81 mg  81 mg Oral Daily Maree Ladona HERO, MD   81 mg at 04/24/24 0740    clopidogrel  (PLAVIX ) tablet 75 mg   75 mg Oral Daily Maree Ladona HERO, MD   75 mg at 04/24/24 0740    albuterol  (PROVENTIL ) (2.5 MG/3ML) 0.083% nebulizer solution 2.5 mg  2.5 mg Nebulization Q4H PRN Sahibzada, Ayaz, MD        ipratropium 0.5 mg-albuterol  2.5 mg (DUONEB ) nebulizer solution 1 Dose  1 Dose Inhalation Q4H PRN Sahibzada, Ayaz, MD        vancomycin  (VANCOCIN ) 1000 mg in sodium chloride  0.9% 250 mL IVPB  1,000 mg IntraVENous Q24H Matriano, Norleen Deward DEL, MD   Stopped at 04/25/24 0010    senna (SENOKOT) tablet 8.6 mg  1 tablet Oral Daily PRN Matriano, Norleen Deward DEL, MD        docusate sodium  (COLACE) capsule 100 mg  100 mg Oral BID PRN Matriano, Norleen Deward DEL, MD        ondansetron  (ZOFRAN ) injection 4 mg  4 mg IntraVENous Q6H PRN Matriano, John Paul H, MD   4 mg at 04/21/24 1544    acetaminophen  (TYLENOL ) tablet 650 mg  650 mg Oral Q6H PRN Matriano, John Paul H, MD   650 mg at 04/18/24 1054    melatonin tablet 3 mg  3 mg Oral Nightly PRN Matriano, Norleen Deward DEL, MD        naloxone  (NARCAN ) injection 0.4 mg  0.4 mg IntraVENous PRN Matriano, Norleen Deward DEL, MD        heparin  (porcine) injection 5,000 Units  5,000 Units SubCUTAneous BID Sahibzada, Ayaz, MD   5,000 Units at 04/24/24 2044    benzonatate  (TESSALON ) capsule 100 mg  100 mg Oral TID PRN Murrell Norleen Deward DEL, MD        cefepime  (MAXIPIME ) 1,000 mg in sodium chloride  0.9 % 50 mL IVPB (mini-bag)  1,000 mg IntraVENous Q12H Matriano, Norleen Deward DEL, MD   Stopped at 04/25/24 [id number]    *Pharmacy to Dose Vancomycin   1 each Other RX Placeholder Matriano, Norleen Deward DEL, MD        morphine  (PF) injection 1 mg  1 mg IntraVENous Q3H PRN Matriano, John Paul H, MD   1 mg at 04/23/24 0557    sodium hypochlorite (DAKINS) 0.125 % external solution   Irrigation Daily Anastasio Cain A, DPM        metroNIDAZOLE  (FLAGYL ) 500 mg in 0.9% NaCl 100 mL IVPB premix  500 mg IntraVENous Q8H Meade Viviane RAMAN, MD   Stopped at 04/25/24 0344    oxyCODONE -acetaminophen  (PERCOCET ) 7.5-325 MG per tablet 1 tablet  1 tablet Oral Q4H PRN  Josepha Planas, DO   1 tablet at 04/25/24 0301         Labs: Results:   Chemistry Recent Labs     04/23/24  0726 04/24/24  0510 04/25/24  0444   NA 136  137 137 137   K 5.0  5.0 5.4* 5.4*   CL 104  104 104 104   CO2 27  27 27 30    BUN 27*  27* 31* 31*      CBC w/Diff Recent Labs  04/24/24  0510 04/25/24  0444   WBC 10.9 10.6   RBC 3.04* 2.84*   HGB 8.8* 8.2*   HCT 30.4* 28.2*   PLT 453* 488*          RADIOLOGY :        Imaging  CTA ABDOMINAL AORTA W BILAT RUNOFF W WO CONTRAST  Addendum Date: 04/24/2024  HISTORY: peripheral artery disease Technique: Noncontrast CT imaging is acquired through the chest for bolus tracking purposes. Enhanced CT imaging through the abdomen, pelvis, and lower extremities is performed during arterial phase of IV contrast menstruation as part of a CTA runoff. Angiographic reconstructions are performed formed at a separate workstation. The patient received an uneventful IV bolus of 100 mL of Omnipaque 350 nonionic contrast as part of the study. 3D angiographic post processing and image rendering was performed by the CT technologist at a separate workstation including coronal and sagittal maximal intensity projection (MIP) reconstructions. COMPARISON STUDY: none FINDINGS: Vascular structures and lower extremities: Abdominal aorta is normal in caliber and enhancement. Celiac, SMA, and renal arteries are moderately calcified yet patent with no high-grade stenosis. IMA is patent. Mild narrowing of the left common iliac artery. The right common iliac and external iliac arteries are patent. Hypogastric arteries are patent. On the right, common femoral and profunda are patent. Diffuse mild disease of the right SFA with moderate grade narrowing in the mid SFA just proximal to the patent stent. The right popliteal artery is patent. Single-vessel runoff to the right foot through the peroneal artery. There is gradual diminished enhancement in the anterior and posterior tibial arteries. The ankle.  Ulcerations are present in the right foot with subcutaneous gas and a vacuum. 2 cm abscess in the plantar subcutaneous tissues with an air-fluid level noted. Additional 2.4 cm abscess in the dorsal subcutaneous tissues of the mid foot noted with an air-fluid level. On the left, common femoral artery is mildly narrowed secondary to plaque and the profunda is patent. Diffuse moderate disease of the left SFA with partially calcified plaque. Popliteal artery is patent. Moderate grade stenosis at the proximal posterior tibial artery. Two-vessel runoff via the posterior tibial and peroneal arteries. Diminished enhancement noted in the distal anterior tibial artery. Along the lateral aspect of the midfoot there is a 2.5 x 4.6 cm area of low density which may represent a developing abscess or phlegmon. There is also a region of edema in the anterior left lower leg at the level of the ankle. At the level of the knee, there is a irregular shaped fluid collection in the anterolateral knee just lateral to the tibial tuberosity which measures approximately 4.2 cm in diameter. Bibasilar subsegmental atelectasis. Heart size is borderline enlarged. The liver has a slight nodular contour. The gallbladder is present and contains excreted contrast. The spleen, pancreas, and adrenal glands are within normal notes. 2.6 cm left renal cyst. Kidneys are otherwise normal in morphology with no hydronephrosis. Appendix and small bowel loops are unremarkable. Diverticulosis is noted throughout the colon. No free intraperitoneal fluid or air. Uterus contains a coarsely calcified leiomyoma. No adnexal or pelvic mass. Urinary bladder is unremarkable. Osseous structures the pelvis and lumbar spine are intact. No compression deformities. IMPRESSION: 1. No aortic inflow stenosis. 2. Moderate grade stenosis of the mid right SFA and patent SFA stent. Patent right popliteal artery with single vessel runoff to the right foot. 3. Diffuse moderate disease  of the left SFA with patent popliteal artery and diseased two-vessel runoff to the  left foot. Moderate grade stenosis in the proximal left posterior tibial artery. 4. Open wound in the right foot with subcutaneous gas and multiple abscesses in the mid foot along the plantar surface and dorsal subcutaneous tissues. 5. Poorly defined fluid collections in the left foot and in the subcutaneous tissues of the anterior left knee which are concerning for additional abscesses. Electronically signed by: Freda Lanes, MD 04/24/2024 5:46 PM EDT          Workstation ID: [id number]     Result Date: 04/24/2024  HISTORY: peripheral artery disease Technique: Noncontrast CT imaging is acquired through the chest for bolus tracking purposes. Enhanced CT imaging through the abdomen, pelvis, and lower extremities is performed during arterial phase of IV contrast menstruation as part of a CTA runoff. Angiographic reconstructions are performed formed at a separate workstation. The patient received an uneventful IV bolus of 100 mL of Omnipaque 350 nonionic contrast as part of the study. COMPARISON STUDY: none FINDINGS: Vascular structures and lower extremities: Abdominal aorta is normal in caliber and enhancement. Celiac, SMA, and renal arteries are moderately calcified yet patent with no high-grade stenosis. IMA is patent. Mild narrowing of the left common iliac artery. The right common iliac and external iliac arteries are patent. Hypogastric arteries are patent. On the right, common femoral and profunda are patent. Diffuse mild disease of the right SFA with moderate grade narrowing in the mid SFA just proximal to the patent stent. The right popliteal artery is patent. Single-vessel runoff to the right foot through the peroneal artery. There is gradual diminished enhancement in the anterior and posterior tibial arteries. The ankle. Ulcerations are present in the right foot with subcutaneous gas and a vacuum. 2 cm abscess in the plantar  subcutaneous tissues with an air-fluid level noted. Additional 2.4 cm abscess in the dorsal subcutaneous tissues of the mid foot noted with an air-fluid level. On the left, common femoral artery is mildly narrowed secondary to plaque and the profunda is patent. Diffuse moderate disease of the left SFA with partially calcified plaque. Popliteal artery is patent. Moderate grade stenosis at the proximal posterior tibial artery. Two-vessel runoff via the posterior tibial and peroneal arteries. Diminished enhancement noted in the distal anterior tibial artery. Along the lateral aspect of the midfoot there is a 2.5 x 4.6 cm area of low density which may represent a developing abscess or phlegmon. There is also a region of edema in the anterior left lower leg at the level of the ankle. At the level of the knee, there is a irregular shaped fluid collection in the anterolateral knee just lateral to the tibial tuberosity which measures approximately 4.2 cm in diameter. Bibasilar subsegmental atelectasis. Heart size is borderline enlarged. The liver has a slight nodular contour. The gallbladder is present and contains excreted contrast. The spleen, pancreas, and adrenal glands are within normal notes. 2.6 cm left renal cyst. Kidneys are otherwise normal in morphology with no hydronephrosis. Appendix and small bowel loops are unremarkable. Diverticulosis is noted throughout the colon. No free intraperitoneal fluid or air. Uterus contains a coarsely calcified leiomyoma. No adnexal or pelvic mass. Urinary bladder is unremarkable. Osseous structures the pelvis and lumbar spine are intact. No compression deformities.     IMPRESSION: 1. No aortic inflow stenosis. 2. Moderate grade stenosis of the mid right SFA and patent SFA stent. Patent right popliteal artery with single vessel runoff to the right foot. 3. Diffuse moderate disease of the left SFA with patent popliteal artery and diseased  two-vessel runoff to the left foot.  Moderate grade stenosis in the proximal left posterior tibial artery. 4. Open wound in the right foot with subcutaneous gas and multiple abscesses in the mid foot along the plantar surface and dorsal subcutaneous tissues. 5. Poorly defined fluid collections in the left foot and in the subcutaneous tissues of the anterior left knee which are concerning for additional abscesses. Electronically signed by: Daryle Darden, MD 04/19/2024 2:45 PM EDT          Workstation ID: [id number]     IR ANGIOGRAM EXTREMITY RIGHT  Result Date: 04/21/2024  Radiology exam is complete. No Radiologist dictation. Please follow up with ordering provider.     CT FOOT LEFT W CONTRAST  Result Date: 04/17/2024  HISTORY: Swelling and pain. TECHNIQUE: Helical acquisition CT images through the left foot was performed with contrast with multiplanar reformats. COMPARISON: None. FINDINGS: No visualized acute fracture or dislocation. No evidence of periosteal reaction or bony destruction to suggest osteomyelitis. There is extensive subcutaneous fat edema and stranding within the lateral soft tissues from the level of the mid foot to the forefoot. In particular, there is a prominent area of focal fluid measuring approximately 3.8 x 2.1 cm on image 20, series 5 that extends superficially to abut the skin and may represent a developing abscess. There is no definite visualized enhancing wall.     IMPRESSION: 1. Findings are compatible with cellulitis of the lateral midfoot and forefoot with more focal 3.8 cm area of fluid at the level of the midfoot that is suspicious for an early developing abscess as there is no discrete capsule. 2. No evidence of osteomyelitis. Electronically signed by: Lillia Hug, MD 04/17/2024 11:37 AM EDT          Workstation ID: [id number]     LE ARTERIAL  Result Date: 04/16/2024                                                                                                                                    Version: 1                                                                                                                                    Study ID: [id number]  [facility]                                                       [address] [place], [place]  [address]                                                           Arterial Lower Extremity Report Name: Susan [name], Susan [name] Date: 04/15/2024, 2: 66 PM MRN: [id number]                                                               Patient Location: [facility] Age: 15 Years                                                               DOB: 16-Nov-1950 (MM/DD/YYYY)                                                                               Gender: Female Performed By: Edilia Romelia Elden Sherrell, RVT                          Account #:: [id number] Ordering Physician: ANASTASIO CAIN A Reason For Study: Peripheral vascular disease.                                                     INTERPRETATION SUMMARY 1. Moderate right lower extremity arterial insufficiency at the level of the iliac-femoral region. 2. Severe left lower extremity arterial insufficiency at the level of the femoral-popliteal region. 3. Digital pressures deferred due to patient's pain. QUALITY/PROCEDURE Arterial Physio ABI [phone]). A bilateral lower  extremity arterial exam was performed. Continuous wave Doppler tracings were recorded from the common femoral, popliteal, posterior tibial and dorsalis pedis arteries bilaterally, and resting ankle brachial pressures were measured. Digital pressures deferred due to patient's pain. RIGHT LEG SEGMENTAL Brachial = 131 mmHg. Ankle Pressure (PT) = 81 mmHg. Ankle Pressure (DP) = 83 mmHg. Right ABI = 0.63. The lower extremity ankle brachial index is 0.63 on the right. The right lower  extremity waveforms are monophasic in the femoral, the popliteal, the posterior tibial and the dorsalis pedis arteries. RIGHT DIGIT PPG Digit 1 flow is absent. LEFT LEG SEGMENTAL Brachial = 126 mmHg. Ankle (PT) = 59 mmHg. Ankle (DP) = 63 mmHg. Left ABI = 0.48. The lower extremity ankle brachial index is 0.48 on the left. The left lower extremity waveforms are multiphasic in the femoral, and monophasic in the popliteal, the posterior tibial and the dorsalis pedis arteries. LEFT DIGIT PPG Digit 1 flow is absent. ______________________________________________________________________________ Electronically signed by: Dr Blenda Fear, M.D                       04/16/2024, 10: 01 PM     US  RETROPERITONEAL COMPLETE  Result Date: 04/15/2024  INDICATION: Acute renal failure. COMPARISON: None. TECHNIQUE: Transabdominal grayscale and duplex sonography of the retroperitoneum performed. FINDINGS: RIGHT KIDNEY:  10.7 cm in length. (Normal range 9-13 cm) Normal renal parenchymal echogenicity. No obstruction, renal calculi or focal lesion. LEFT KIDNEY: 9.8 cm in length. (Normal range 9-13 cm) Normal renal parenchymal echogenicity.  And interpolar simple-appearing partially exophytic left renal cyst measures 2.6 x 2.6 x 2.1 cm. URINARY BLADDER: Bladder has a pre-void volume of 240.0 mL. (<50 mL WNL) Patient did not void for the exam. Neither ureteral jet(s) identified. No urinary bladder wall thickening or debris within the urinary bladder. (Normal wall thickness <3 mm distended, <5 mm underdistended.) Bladder contains a Foley catheter and is decompressed. PANCREAS:  Visualized pancreas is within normal limits. IVC: Patent. ABDOMINAL AORTA: Normal caliber abdominal aorta. COMMON ILIAC ARTERIES: Suboptimally visualized secondary to interposed bowel gas.     IMPRESSION: 1.  No hydronephrosis. Kidneys unremarkable sonographically with the exception of a partially exophytic simple-appearing interpolar left renal cyst. 2.  Urinary bladder  unremarkable sonographically. 3.  Common iliac arteries above the visualized sonographically due to interposed bowel gas Electronically signed by: Alm Conte, MD 04/15/2024 8:34 PM EDT          Workstation ID: [id number]     CT FOOT RIGHT W CONTRAST  Result Date: 04/15/2024  Indication: Right foot infection. Evaluate for abscess. Comparison: None available Technique: Computed tomographic imaging of the right foot was performed following IV contrast administration. Coronal and sagittal reconstructions were created from the initial axial data set and submitted for interpretation. Findings: Complex gas and fluid containing abscess//phlegmon in the subcutaneous lateral hindfoot spanning approximately 4.4 cm craniocaudal by 2 x 1.5 cm transverse. This is positioned posterior to the peroneal tendons which are intact and not overtly involved. There is subtle loss of bony cortex/rarefaction at the lateral margin of the subjacent calcaneus concerning for osteomyelitis.     IMPRESSION: 1. Subcutaneous phlegmon/abscess lateral right hindfoot and 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 of the subjacent lateral margin of the calcaneus. Electronically signed by: Glade Reeve, MD 04/15/2024 2:16 PM EDT          Workstation ID: [id number]     XR FOOT RIGHT (2 VIEWS)  Result Date: 04/15/2024  Exam: 2 views of the right foot. Clinical indications:  PAIN. RULE OUT OSTEOMYELITIS. Right foot pain and swelling. Comparison:   04/15/2024;  Findings:  No fracture.  No dislocation. No radiopaque foreign bodies. No bone erosions.     Impression:  No fracture. No bone erosions to suggest osteomyelitis. Electronically signed by: Celine Rear, MD 04/15/2024 12:52 AM EDT          Workstation ID: [id number]     XR FOOT LEFT (2 VIEWS)  Result Date: 04/15/2024  Exam: 2 views of the left foot. Clinical indications:  PAIN right foot. Swelling. Possible osteomyelitis. Comparison:  None Findings:  No fracture.  No dislocation. No bone erosions. No radiopaque  foreign bodies.     Impression:  No fracture. Electronically signed by: Celine Rear, MD 04/15/2024 12:51 AM EDT          Workstation ID: [id number]     XR CHEST PORTABLE  Result Date: 04/15/2024  Exam: AP portable chest Clinical indication: SEPSIS Comparison: 04/08/2024;  Results:  Left basilar opacity. No pneumothorax. Heart normal.  Mediastinal contours are normal. No free air is seen under the hemidiaphragms.   Osseous structures intact.     IMPRESSION: Left basilar opacity. Developing pneumonia versus atelectasis. Electronically signed by: Celine Rear, MD 04/15/2024 12:41 AM EDT          Workstation ID: [id number]     VL DUP LOWER EXTREMITY VENOUS BILATERAL  Result Date: 04/09/2024                                                                                                                                    Version: 1                                                                                                                                   Study ID: [id number]                                                       [facility]  [address] [place], [place]  [address]                                                      Lower Extremity Venous Duplex Report Name: Susan [name], Susan [name] Date: 04/08/2024, 2: 66 PM MRN: [id number]                                                               Patient Location: ER^[facility]^WT^[facility] Age: 17 Years                                                               DOB: 1951-04-07 (MM/DD/YYYY)                                                                               Gender: Female Performed By: Kylee R. Holloman, RVT                                       Account #:: [id number] Ordering Physician: [facility], [facility] Reason For Study: Lower extremity pain and swelling                                                      INTERPRETATION SUMMARY 1. No evidence of deep vein thrombosis in the bilateral lower extremities. QUALITY/PROCEDURE Complete bilateral venous duplex performed. Quality of the study is good. Location: ER. RIGHT LEG The deep venous system of the right lower extremity was examined using duplex ultrasound. B-mode imaging demonstrated normal compressibility of the right common femoral, femoral, popliteal, posterior tibial and peroneal veins. Doppler flow signals were spontaneous and phasic at all levels. RIGHT SAPHENOUS VEINS Right great saphenous vein at the saphenofemoral junction demonstrates normal compressibility with spontaneous and phasic venous hemodynamics. LEFT LEG  The deep venous system of the left lower extremity was examined using duplex ultrasound. B-mode imaging demonstrated normal compressibility of the left common femoral, femoral, popliteal, posterior tibial and peroneal veins. Doppler flow signals were spontaneous and phasic at all levels. LEFT LEG SAPHENOUS VEINS Left great saphenous vein at the saphenofemoral junction demonstrates normal compressibility with spontaneous and phasic venous hemodynamics. ______________________________________________________________________________ Electronically signed by: Dr Blenda Fear, M.D                       04/09/2024, 8: 57 AM     XR CHEST (2 VW)  Result Date: 04/08/2024  History: Shortness of breath Comparison: None Technique: PA and lateral radiographs of the chest were obtained. Two views, 2 exposures total. Findings: The lungs and pleural spaces are clear. Normal cardiomediastinal silhouette. Degenerative changes of the thoracic spine.     IMPRESSION: No acute cardiopulmonary disease. Electronically signed by: Norleen Gleason 04/08/2024 11:20 AM EDT          Workstation ID: [id number]        I have independently reviewed lab studies and imgaing as well as review of nursing notes and physican notes from the past 24 hours.    Dragon  medical dictation software was used for portions of this report. Unintended errors may occur.     Susan Julieanne Meadows, MD  April 25, 2024    [facility] Infectious Disease   Office Phone:[phone]  [phone]

## 2024-04-25 NOTE — Progress Notes (Signed)
 NUTRITION RECOMMENDATIONS:   Resume diet as medically feasible. Rec CCHO4. Monitor for need for K+ restriction.  Resume Glucerna BID (220 kcals, 10 g protein each) as per diet order. Monitor for need to change to Nepro.   Resume Juven BID (provides 95 kcals, 2.5 g collagen protein, 300 mg vitamin C, 9.5 mg zinc each) as allowed by diet order.   On K+ binder - monitor lytes for repletion/restriction  Glucommander for BG control PRN  Wts 3x weekly  Consider multivitamin  Discharge recommendations: Anticipate CCHO as needed, Juven 14 days or until wounds healed      NUTRITION FOLLOW-UP      Current Intake: variable  Diet NPO Exceptions are: Sips of Water  with Meds Per last documentation in flow sheets dated 7/23, pt with  PO Meals Eaten (%): 26 - 50% .   Estimated average intake provides approximately 1064 kcal (47%), 50 g protein (47%) daily.      Subjective: Pt off floor for debridement at time of visit. Fair intake. BG has been elevated, rec CCHO at this time.      Pertinent Medications: aspirin , lipitor  (FDI), cefepime , plavix , heparin , insulin , flagyl , veltessa, vanco, D5NS 75mL/hr - 306kcal daily, LR 35mL/hr, D5 bolus, variable dosing, percocet     Pertinent Labs: BG 139, POC BG 229, Phos 2.0    Current Inpatient Weight Trends:    Admit wt: 81.6 kg on 7/14  Current wt: 81.6 kg on 7/25   Wt Readings from Last 3 Encounters:   04/25/24 81.6 kg (179 lb 14.3 oz)   04/08/24 81.6 kg (180 lb)   12/19/23 88.5 kg (195 lb)      BMI: Body mass index is 28.18 kg/m.                  GI Symptoms/Issues: LBM 7/24  Chewing/Swallowing Issues: none noted  Skin Integrity: L heel wound -> sx incision w/ wound vac, sacral PU I  Fluid Accumulation/Edema: Per I/Os, pt is -3.6 L since 7/19.   Edema: Right lower extremity, Left lower extremity    Physical Assessment: pt off floor    GLIM Criteria Met:  [x]  Reduced food intake or assimilation ( [x]  <50% of EEN >1 week or  []   Any reduction >2 weeks) *suspected  [x]  Inflammation (Specify):  Alb 1.8, CRP 252.2 (7/16)    Estimated Daily Nutrition Needs:    81.6 kg   -- []    Admit  [x]  Actual  []  Ideal   2285 - 2693 kcals (28 - 33 kcal/kg)   106 - 122 g protein (1.3 - 1.5 g/kg)   1632 mL fluid (20 ml/kg or per MD)     Assessment of Current MNT: Diet is appropriate and adequate to meet nutrition needs if pt consuming 75% of trays on average, recommend adding oral nutrition supplement to increase protein-calorie opportunity.      NUTRITION DIAGNOSIS:     Increased nutrient needs (protein/kcal) related to metabolic demands as evidenced by wounds POA, continues    NUTRITION INTERVENTION / RECOMMENDATIONS:     Resume diet as medically feasible. Rec CCHO4. Monitor for need for K+ restriction.  Resume Glucerna BID (220 kcals, 10 g protein each) as per diet order. Monitor for need to change to Nepro.   Resume Juven BID (provides 95 kcals, 2.5 g collagen protein, 300 mg vitamin C, 9.5 mg zinc each) as allowed by diet order.   On K+ binder - monitor lytes for repletion/restriction  Glucommander for BG control  PRN  Wts 3x weekly  Consider multivitamin  Discharge recommendations: Anticipate CCHO as needed, Juven 14 days or until wounds healed    NUTRITION MONITORING AND EVALUATION:     Nutrition Level of Care:   moderate    Progress Towards Nutrition Goals: []   Met/Ongoing     []   Progressing Appropriately       [x]   Progressing Slowly     []   Not Progressing    Monitoring and Evaluation: PO intake, wt trends, nutrition related labs, skin integrity, and if applicable, supplement acceptance and/or nutrition support tolerance.     Nutrition Goals:  Pt to meet/tolerate >/= 75% of estimated needs, weight maintenance throughout LOS, BG control <180, Improvement and/or maintenance of skin integrity and nutrition related labs.     Code Status: Full Code     Elenor Jenelle Penner, MS, RDN, CNSC  04/25/2024  Office: [phone]

## 2024-04-25 NOTE — Other (Signed)
 TRANSFER - IN REPORT:    Verbal report received from 5W/RN/DAWN on Susan [name]   for ordered procedure      Report consisted of patient's Situation, Background, Assessment and   Recommendations(SBAR).     Information from the following report(s) Pre Procedure Checklist, Procedure Verification, Quality Measures, and Neuro Assessment was reviewed with the receiving nurse.    Opportunity for questions and clarification was provided.      Assessment completed upon patient's arrival to unit and care assumed.

## 2024-04-25 NOTE — Anesthesia Pre-Procedure Evaluation (Addendum)
 Department of Anesthesiology  Preprocedure Note       Name:  Susan [name]   Age:  73 y.o.  DOB:  [birth year]                                          MRN:  [id number]         Date:  04/25/2024      Surgeon: Clotilde):  Rosamond Inches, DPM    Procedure: Procedure(s):  DEBRIDEMENT RIGHT FOOT WITH GRAFT APPLICATION; DRAINAGE OF ABSCESS LEFT FOOT    Medications prior to admission:   Prior to Admission medications    Medication Sig Start Date End Date Taking? Authorizing Provider   furosemide  (LASIX ) 40 MG tablet Take 1 tablet by mouth daily 01/22/24  Yes [provider]   carvedilol  (COREG ) 12.5 MG tablet Take 1 tablet by mouth 2 times daily (with meals) 01/22/24  Yes [provider]   atorvastatin  (LIPITOR ) 80 MG tablet Take 1 tablet by mouth nightly 01/30/24  Yes [provider]   GEMTESA 75 MG TABS tablet Take 1 tablet by mouth daily 01/23/24  Yes [provider]   VRAYLAR 1.5 MG capsule Take 1 capsule by mouth daily   Yes [provider]   clopidogrel  (PLAVIX ) 75 MG tablet Take 1 tablet by mouth daily   Yes [provider]   QUEtiapine (SEROQUEL) 100 MG tablet Take 1 tablet by mouth daily   Yes [provider]   pantoprazole (PROTONIX) 40 MG tablet Take 1 tablet by mouth daily   Yes [provider]   potassium chloride (KLOR-CON) 10 MEQ extended release tablet Take 1 tablet by mouth daily with food   Yes [provider]   valsartan  (DIOVAN ) 160 MG tablet Take 1 tablet by mouth 2 times daily   Yes [provider]   MYRBETRIQ 25 MG TB24 Take 1 tablet by mouth daily   Yes [provider]   DOMINIC ELLIPTA 200-62.5-25 MCG/ACT AEPB inhaler Inhale 1 puff into the lungs daily   Yes [provider]       Current medications:    Current Facility-Administered Medications   Medication Dose Route Frequency Provider Last Rate Last Admin   . lidocaine  PF 1 % injection 1 mL  1 mL IntraDERmal Once Vyas, Shruti, DPM       . lidocaine   1 % injection 1 mL  1 mL IntraDERmal Once PRN Cleotilde, Damiana Berrian A, DO       . dextrose  5 % and 0.9 % sodium chloride  infusion   IntraVENous Continuous Josepha Planas, DO 75 mL/hr at 04/25/24 1251 Rate Change at 04/25/24 1251   . dextrose  50 % IV solution  10-50 mL IntraVENous PRN Josepha Planas, DO   50 mL at 04/25/24 1000   . glucagon  injection 1 mg  1 mg IntraMUSCular PRN Josepha Planas, DO       . insulin  glargine (LANTUS ) injection vial 1-100 Units  1-100 Units SubCUTAneous QHS Josepha Planas, DO       . insulin  lispro (HUMALOG ,ADMELOG ) injection vial 1-100 Units  1-100 Units SubCUTAneous 4x Daily AC & HS Josepha Planas, DO   2 Units at 04/25/24 1131   . insulin  lispro (HUMALOG ,ADMELOG ) injection vial 1-100 Units  1-100 Units SubCUTAneous PRN Josepha Planas, DO       . cefepime  (MAXIPIME ) 1,000 mg in sodium chloride  0.9 %  50 mL IVPB (mini-bag)  1,000 mg IntraVENous Q8H Matriano, Norleen Deward DEL, MD 12.5 mL/hr at 04/25/24 1148 1,000 mg at 04/25/24 1148   . patiromer  sorbitex calcium  (VELTASSA ) packet 8.4 g  8.4 g Oral Daily Josepha Planas, DO   8.4 g at 04/25/24 1256   . atorvastatin  (LIPITOR ) tablet 40 mg  40 mg Oral Nightly Maree Ladona HERO, MD   40 mg at 04/24/24 2044   . aspirin  EC tablet 81 mg  81 mg Oral Daily Maree Ladona HERO, MD   81 mg at 04/24/24 0740   . clopidogrel  (PLAVIX ) tablet 75 mg  75 mg Oral Daily Maree Ladona HERO, MD   75 mg at 04/24/24 0740   . albuterol  (PROVENTIL ) (2.5 MG/3ML) 0.083% nebulizer solution 2.5 mg  2.5 mg Nebulization Q4H PRN Sahibzada, Ayaz, MD       . ipratropium 0.5 mg-albuterol  2.5 mg (DUONEB ) nebulizer solution 1 Dose  1 Dose Inhalation Q4H PRN Sahibzada, Ayaz, MD       . vancomycin  (VANCOCIN ) 1000 mg in sodium chloride  0.9% 250 mL IVPB  1,000 mg IntraVENous Q24H Matriano, Norleen Deward DEL, MD   Stopped at 04/25/24 0010   . senna (SENOKOT) tablet 8.6 mg  1 tablet Oral Daily PRN Matriano, John Paul H, MD       . docusate sodium  (COLACE) capsule 100 mg  100 mg Oral BID PRN Matriano, Norleen Deward DEL, MD       .  ondansetron  (ZOFRAN ) injection 4 mg  4 mg IntraVENous Q6H PRN Matriano, John Paul H, MD   4 mg at 04/21/24 1544   . acetaminophen  (TYLENOL ) tablet 650 mg  650 mg Oral Q6H PRN Matriano, John Paul H, MD   650 mg at 04/18/24 1054   . melatonin tablet 3 mg  3 mg Oral Nightly PRN Matriano, Norleen Deward DEL, MD       . naloxone  (NARCAN ) injection 0.4 mg  0.4 mg IntraVENous PRN Matriano, John Paul H, MD       . heparin  (porcine) injection 5,000 Units  5,000 Units SubCUTAneous BID Sahibzada, Ayaz, MD   5,000 Units at 04/24/24 2044   . benzonatate  (TESSALON ) capsule 100 mg  100 mg Oral TID PRN Matriano, Norleen Deward DEL, MD       . *Pharmacy to Dose Vancomycin   1 each Other RX Placeholder Matriano, Norleen Deward DEL, MD       . morphine  (PF) injection 1 mg  1 mg IntraVENous Q3H PRN Murrell John Paul H, MD   1 mg at 04/23/24 0557   . sodium hypochlorite (DAKINS) 0.125 % external solution   Irrigation Daily Anastasio Cain A, DPM       . metroNIDAZOLE  (FLAGYL ) 500 mg in 0.9% NaCl 100 mL IVPB premix  500 mg IntraVENous Q8H Meade Viviane RAMAN, MD   Stopped at 04/25/24 0344   . oxyCODONE -acetaminophen  (PERCOCET ) 7.5-325 MG per tablet 1 tablet  1 tablet Oral Q4H PRN Josepha Planas, DO   1 tablet at 04/25/24 9162       Allergies:    Allergies   Allergen Reactions   . Penicillins Other (See Comments)     States unknown reaction     Mother told her as a child she was allergic       Problem List:    Patient Active Problem List   Diagnosis Code   . Asthma J45.909   . Psychiatric disorder F99   . Peripheral neuropathy G62.9   . Chronic  obstructive pulmonary disease (HCC) J44.9   . Hypertension I10   . OSA (obstructive sleep apnea) G47.33   . Dyslipidemia E78.5   . Bipolar 1 disorder (HCC) F31.9   . Foot abscess L02.619       Past Medical History:        Diagnosis Date   . Hyperlipidemia    . Hypertension        Past Surgical History:        Procedure Laterality Date   . LEG SURGERY Right 04/17/2024    RIGHT HEEL IRRIGATION AND DEBRIDEMENT WITH WOUND VAC  APPLICATION performed by Rosamond Inches, DPM at [facility] MAIN OR       Social History:    Social History     Tobacco Use   . Smoking status: Former     Types: Cigarettes   . Smokeless tobacco: Never   Substance Use Topics   . Alcohol use: Not Currently                                Counseling given: Not Answered      Vital Signs (Current):   Vitals:    04/25/24 0456 04/25/24 0637 04/25/24 0748 04/25/24 1157   BP: (!) 123/57 (!) 151/77 (!) 144/77 121/64   Pulse: 78 89 77 74   Resp: 19 18 20 20    Temp: 97.2 F (36.2 C) 97.5 F (36.4 C) 99 F (37.2 C) 99.6 F (37.6 C)   TempSrc: Temporal  Temporal Temporal   SpO2:   100% 100%   Weight:  81.6 kg (179 lb 14.3 oz)     Height:  1.702 m (5' 7)                                                BP Readings from Last 3 Encounters:   04/25/24 121/64   04/08/24 (!) 100/50   12/19/23 (!) 156/85       NPO Status: Time of last liquid consumption: 0300                        Time of last solid consumption: 2100                        Date of last liquid consumption: 04/25/24                        Date of last solid food consumption: 04/24/24    BMI:   Wt Readings from Last 3 Encounters:   04/25/24 81.6 kg (179 lb 14.3 oz)   04/08/24 81.6 kg (180 lb)   12/19/23 88.5 kg (195 lb)     Body mass index is 28.18 kg/m.    CBC:   Lab Results   Component Value Date/Time    WBC 10.6 04/25/2024 04:44 AM    RBC 2.84 04/25/2024 04:44 AM    HGB 8.2 04/25/2024 04:44 AM    HCT 28.2 04/25/2024 04:44 AM    MCV 99.3 04/25/2024 04:44 AM    RDW 58.2 04/25/2024 04:44 AM    PLT 488 04/25/2024 04:44 AM       CMP:   Lab Results   Component Value Date/Time    NA 137  04/25/2024 09:34 AM    K 5.1 04/25/2024 09:34 AM    K 5.1 04/25/2024 09:34 AM    CL 103 04/25/2024 09:34 AM    CO2 28 04/25/2024 09:34 AM    BUN 29 04/25/2024 09:34 AM    CREATININE 0.79 04/25/2024 09:34 AM    GFRAA >60.0 04/25/2024 09:34 AM    LABGLOM >60 04/25/2024 09:34 AM    GLUCOSE 139 04/25/2024 09:34 AM    CALCIUM  9.5 04/25/2024 09:34 AM     BILITOT 0.40 04/16/2024 11:21 AM    ALKPHOS 82 04/16/2024 11:21 AM    AST 14.0 04/16/2024 11:21 AM    ALT 11 04/16/2024 11:21 AM       POC Tests:   Recent Labs     04/25/24  1103   POCGLU 229*       Coags: No results found for: PROTIME, INR, APTT    HCG (If Applicable): No results found for: PREGTESTUR, PREGSERUM, HCG, HCGQUANT     ABGs: No results found for: PHART, PO2ART, PCO2ART, HCO3ART, BEART, O2SATART     Type & Screen (If Applicable):  Lab Results   Component Value Date    ABORH A Rh Positive 04/17/2024    LABANTI NEG 04/17/2024       Drug/Infectious Status (If Applicable):  Lab Results   Component Value Date/Time    HEPCAB <0.1 10/27/2015 12:00 AM       COVID-19 Screening (If Applicable): No results found for: COVID19        Anesthesia Evaluation  Patient summary reviewed and Nursing notes reviewed   no history of anesthetic complications:   Airway: Mallampati: III  TM distance: >3 FB     Mouth opening: > = 3 FB   Dental:    (+) poor dentition  Comment: No top teeth  Missing mant teeth on bottom  Teeth that remain have significant dental disease    Pulmonary:normal exam    (+)  COPD:    sleep apnea:       asthma:     (-) not a current smoker                           Cardiovascular:  Exercise tolerance: poor (<4 METS)  (+) hypertension:, pulmonary hypertension:, hyperlipidemia        Rhythm: regular  Rate: normal                    Neuro/Psych:   (+) neuromuscular disease (peripheral neuropathy):, psychiatric history:            GI/Hepatic/Renal:             Endo/Other:    (+) blood dyscrasia: anticoagulation therapy and anemia:., electrolyte abnormalities.                  ROS comment: Case delayed for K of 5.4, K on last recheck was 5.1   Abdominal: normal exam            Vascular:   + PVD, aortic or cerebral.       Other Findings:         Anesthesia Plan      MAC and general     ASA 3       Induction: intravenous.    MIPS: Postoperative opioids intended and Prophylactic  antiemetics administered.  Anesthetic plan and risks discussed with patient.      Plan discussed with CRNA.  Barbra Miner A Michon Kaczmarek, DO   04/25/2024

## 2024-04-25 NOTE — Other (Signed)
 1700: called surgeon Dr. Rosamond to let her know pt wound vac is beeping saying air leak. Dr. Rosamond stated to turn off wound vac and she will have the wound care nurse come and look at it.

## 2024-04-25 NOTE — Progress Notes (Signed)
 Assessment/Plan:     Patient seen for complicated medical problems as listed below :    Right foot abscess   Left foot abscess  S/p I and D in ER  PAD bilateral lower extremities  Chronic hypoxic resp failure  COPD  Possible pneumonia  Plan     Persistent borderline hyperkalemia.  Start scheduled Veltassa   Status post angiogram, discussed with Dr. Maree, wound VAC removed, tissue clean, good bleeding.  Greatly appreciate assist.  CT showed bilateral foot abscess.  7/17 s/p OR with I&D and placement of wound VAC on right foot-OR cultures negative so far and awaiting bone path. 7/19 CT still showing multiple abscesses in the right midfoot and subcu area.  Left foot also showed multiple abscess, podiatrist planning debridement of right ankle as well as I&D of left foot abscess today, however, potassium was high this morning   start patient on D5 normal saline, give D50, also insulin  subcu, will also give patient nebulizer treatment, start scheduled of Veltassa , will also give 1 dose of Kayexalate .  Repeat potassium 5.1, patient can proceed with surgery today.  CTA lower extremities showing moderate PAD  Continue empiric IV vancomycin /cefepime /Flagyl .  Vanco trough  18.9, dose vancomycin  accordingly.    Continue bronchodilators  O2 supplementation  Dvt ppx with heparin  subcu  Discussed with patient   Continue home regimen for otherwise chronic, stable medical conditions as noted above.   I will order CBC and renal function for tomorrow    Reason for continued hospitalization: OR today    EXAM:  GENERAL: in mild distress.   RESPIRATORY: Bilateral BS present. No rales or rhonchi.   CARDIOVASCULAR: S1 and S2 present, regular. No murmur  ABDOMEN: Soft and nontender with positive bowel sounds.   NEUROLOGICAL: Cranial nerves II through XII grossly intact. No focal weakness.     Subjective:         Vitals:    04/25/24 0456 04/25/24 0637 04/25/24 0748 04/25/24 1157   BP: (!) 123/57 (!) 151/77 (!) 144/77 121/64   Pulse: 78 89  77 74   Resp: 19 18 20 20    Temp: 97.2 F (36.2 C) 97.5 F (36.4 C) 99 F (37.2 C) 99.6 F (37.6 C)   TempSrc: Temporal  Temporal Temporal   SpO2:   100% 100%   Weight:  81.6 kg (179 lb 14.3 oz)     Height:  1.702 m (5' 7)         Recent Labs     04/25/24  0444   WBC 10.6   HGB 8.2*   HCT 28.2*   MCV 99.3*   PLT 488*     Recent Labs     04/25/24  0934   NA 137   K 5.1  5.1   CL 103   CO2 28   ANIONGAP 6   CALCIUM  9.5   BUN 29*   CREATININE 0.79   GLUCOSE 139*     No results for input(s): LABALBU, TP, AST, ALKPHOS, BILITOT, BILIDIR, ALT, GLOB in the last 72 hours.    Invalid input(s): AGRAT  Lab Results   Component Value Date/Time    COLORU Yellow 04/15/2024 03:02 AM    CLARITYU Clear 04/15/2024 03:02 AM    GLUCOSEU Negative 04/15/2024 03:02 AM    BILIRUBINUR Negative 04/15/2024 03:02 AM    KETUA Negative 04/15/2024 03:02 AM    BLOODU Trace-lysed 04/15/2024 03:02 AM    PHUR 5.5 04/15/2024 03:02 AM    NITRU Negative 04/15/2024  03:02 AM    LEUKOCYTESUR Trace 04/15/2024 03:02 AM     Recent Labs     04/25/24  0839 04/25/24  1103   POCGLU 158* 229*         Discussed with patient and family regarding management, prognosis, treatment and complications of the patient's medical conditions in detail. All questions answered  to the satisfaction of those individual(s) who also verbalized understanding of and agreement with the assessment and plan.     Dr. [place]ia, Garnette RAMAN, MD, thank you for allowing us  to participate in the care of this patient.  Please contact me through the hospital if questions arise.  Dragon medical dictation software was used for portions of this report. Unintended errors may occur.     Olam Campi D.O.  [facility]

## 2024-04-25 NOTE — Plan of Care (Signed)
 Problem: Respiratory - Adult  Goal: Achieves optimal ventilation and oxygenation  Outcome: Progressing     Problem: Pain  Goal: Verbalizes/displays adequate comfort level or baseline comfort level  Outcome: Progressing     Problem: Safety - Adult  Goal: Free from fall injury  Outcome: Progressing     Problem: Skin/Tissue Integrity  Goal: Skin integrity remains intact  Description: 1.  Monitor for areas of redness and/or skin breakdown  2.  Assess vascular access sites hourly  3.  Every 4-6 hours minimum:  Change oxygen saturation probe site  4.  Every 4-6 hours:  If on nasal continuous positive airway pressure, respiratory therapy assess nares and determine need for appliance change or resting period  Outcome: Progressing

## 2024-04-26 LAB — CBC WITH AUTO DIFFERENTIAL
Basophils: 0.4 % (ref 0–3)
Eosinophils: 1.9 % (ref 0–5)
Hematocrit: 23.8 % — ABNORMAL LOW (ref 35.0–47.0)
Hemoglobin: 7.4 g/dL — ABNORMAL LOW (ref 11.0–16.0)
Immature Granulocytes %: 0.7 % (ref 0.0–3.0)
Lymphocytes: 13.4 % — ABNORMAL LOW (ref 28–48)
MCH: 29.6 pg (ref 25.4–34.6)
MCHC: 31.1 g/dL (ref 30.0–36.0)
MCV: 95.2 fL (ref 80.0–98.0)
MPV: 10.1 fL — ABNORMAL HIGH (ref 6.0–10.0)
Monocytes: 8.6 % (ref 1–13)
Neutrophils Segmented: 75 % — ABNORMAL HIGH (ref 34–64)
Nucleated RBCs: 0 (ref 0–0)
Platelets: 511 1000/mm3 — ABNORMAL HIGH (ref 140–450)
RBC: 2.5 M/uL — ABNORMAL LOW (ref 3.60–5.20)
RDW: 55.2 — ABNORMAL HIGH (ref 36.4–46.3)
WBC: 9.6 1000/mm3 (ref 4.0–11.0)

## 2024-04-26 LAB — RENAL FUNCTION PANEL
Albumin: 1.7 g/dL — ABNORMAL LOW (ref 3.4–5.0)
Anion Gap: 5 mmol/L (ref 5–15)
BUN: 19 mg/dL (ref 9–23)
CO2: 28 meq/L (ref 20–31)
Calcium: 8.6 mg/dL — ABNORMAL LOW (ref 8.7–10.4)
Chloride: 105 meq/L (ref 98–107)
Creatinine: 0.74 mg/dL (ref 0.55–1.02)
GFR African American: >60
GFR Non-African American: >60
Glucose: 86 mg/dL (ref 74–106)
Phosphorus: 2 mg/dL — ABNORMAL LOW (ref 2.4–5.1)
Potassium: 4.5 meq/L (ref 3.5–5.1)
Sodium: 138 meq/L (ref 136–145)

## 2024-04-26 LAB — POC GLUCOSE, FINGERSTICK
POC Glucose: 116 mg/dL — ABNORMAL HIGH (ref 65–105)
POC Glucose: 109 mg/dL — ABNORMAL HIGH (ref 65–105)
POC Glucose: 115 mg/dL — ABNORMAL HIGH (ref 65–105)
POC Glucose: 97 mg/dL (ref 65–105)

## 2024-04-26 MED ORDER — POTASSIUM & SODIUM PHOSPHATES 280-160-250 MG PO PACK
280-160-250 | Freq: Four times a day (QID) | ORAL | Status: DC
Start: 2024-04-26 — End: 2024-05-02
  Administered 2024-04-26 – 2024-05-02 (×24): 250 via ORAL

## 2024-04-26 MED FILL — ASPIRIN LOW DOSE 81 MG PO TBEC: 81 mg | ORAL | Qty: 1 | Fill #0

## 2024-04-26 MED FILL — CEFEPIME HCL 1 G IJ SOLR: 1 g | INTRAMUSCULAR | Qty: 1000 | Fill #0

## 2024-04-26 MED FILL — SODIUM-POTASSIUM-PHOSPHORUS 160-280-250 MG PO PACK: 160-280-250 mg | ORAL | Qty: 1 | Fill #0

## 2024-04-26 MED FILL — HEPARIN SODIUM (PORCINE) 5000 UNIT/ML IJ SOLN: 5000 [IU]/mL | INTRAMUSCULAR | Qty: 1 | Fill #0

## 2024-04-26 MED FILL — METRONIDAZOLE 500 MG/100ML IV SOLN: 500 MG/100ML | INTRAVENOUS | Qty: 100 | Fill #0

## 2024-04-26 MED FILL — VANCOMYCIN (VANCOCIN) 1000 MG IN SODIUM CHLORIDE 0.9% 250 ML IVPB: Qty: 250 | Fill #0

## 2024-04-26 MED FILL — OXYCODONE-ACETAMINOPHEN 7.5-325 MG PO TABS: 7.5-325 mg | ORAL | Qty: 1 | Fill #0

## 2024-04-26 MED FILL — ATORVASTATIN CALCIUM 40 MG PO TABS: 40 mg | ORAL | Qty: 1 | Fill #0

## 2024-04-26 MED FILL — ACETAMINOPHEN 325 MG PO TABS: 325 mg | ORAL | Qty: 2 | Fill #0

## 2024-04-26 MED FILL — ADMELOG 100 UNIT/ML IJ SOLN: 100 [IU]/mL | INTRAMUSCULAR | Qty: 1 | Fill #0

## 2024-04-26 MED FILL — LANTUS 100 UNIT/ML SC SOLN: 100 [IU]/mL | SUBCUTANEOUS | Qty: 1 | Fill #0

## 2024-04-26 MED FILL — CLOPIDOGREL BISULFATE 75 MG PO TABS: 75 mg | ORAL | Qty: 1 | Fill #0

## 2024-04-26 MED FILL — VELTASSA 8.4 G PO PACK: 8.4 g | ORAL | Qty: 1 | Fill #0

## 2024-04-26 NOTE — Progress Notes (Signed)
 Assessment/Plan:     Patient seen for complicated medical problems as listed below :    Right foot abscess   Left foot abscess  S/p I and D in ER  PAD bilateral lower extremities  Chronic hypoxic resp failure  COPD  Possible pneumonia  Plan     Persistent borderline hyperkalemia.  Start scheduled Veltassa   Status post angiogram, discussed with Dr. Maree, wound VAC removed, tissue clean, good bleeding.  Greatly appreciate assist.  CT showed bilateral foot abscess.  7/17 s/p OR with I&D and placement of wound VAC on right foot-OR cultures negative so far and awaiting bone path. 7/19 CT still showing multiple abscesses in the right midfoot and subcu area.  Left foot also showed multiple abscess, status post debridement of right ankle as well as I&D of left foot abscess, follow OR culture  Patient hyperkalemia resolved.  CTA lower extremities showing moderate PAD  Continue empiric IV vancomycin /cefepime /Flagyl .  Vanco trough  18.9, dose vancomycin  accordingly.    Continue bronchodilators  O2 supplementation  Dvt ppx with heparin  subcu  Discussed with patient   Continue home regimen for otherwise chronic, stable medical conditions as noted above.   I will order CBC and renal function for tomorrow    Reason for continued hospitalization: Follow OR culture    EXAM:  GENERAL: in mild distress.   RESPIRATORY: Bilateral BS present. No rales or rhonchi.   CARDIOVASCULAR: S1 and S2 present, regular. No murmur  ABDOMEN: Soft and nontender with positive bowel sounds.   NEUROLOGICAL: Cranial nerves II through XII grossly intact. No focal weakness.     Subjective:         Vitals:    04/25/24 2302 04/26/24 0439 04/26/24 0616 04/26/24 0842   BP: (!) 144/81 (!) 149/71  (!) 141/69   Pulse: 73 80  74   Resp: 17 17 19 18    Temp: (!) 100.6 F (38.1 C) 100 F (37.8 C)  98.7 F (37.1 C)   TempSrc: Temporal Temporal  Temporal   SpO2: 98% 96%  97%   Weight:       Height:           Recent Labs     04/26/24  0525   WBC 9.6   HGB 7.4*   HCT  23.8*   MCV 95.2   PLT 511*     Recent Labs     04/26/24  0525   NA 138   K 4.5   CL 105   CO2 28   ANIONGAP 5   CALCIUM  8.6*   BUN 19   CREATININE 0.74   GLUCOSE 86     No results for input(s): LABALBU, TP, AST, ALKPHOS, BILITOT, BILIDIR, ALT, GLOB in the last 72 hours.    Invalid input(s): AGRAT  Lab Results   Component Value Date/Time    COLORU Yellow 04/15/2024 03:02 AM    CLARITYU Clear 04/15/2024 03:02 AM    GLUCOSEU Negative 04/15/2024 03:02 AM    BILIRUBINUR Negative 04/15/2024 03:02 AM    KETUA Negative 04/15/2024 03:02 AM    BLOODU Trace-lysed 04/15/2024 03:02 AM    PHUR 5.5 04/15/2024 03:02 AM    NITRU Negative 04/15/2024 03:02 AM    LEUKOCYTESUR Trace 04/15/2024 03:02 AM     Recent Labs     04/25/24  0839 04/25/24  1103 04/25/24  2334 04/26/24  0842   POCGLU 158* 229* 116* 109*         Discussed with patient and  family regarding management, prognosis, treatment and complications of the patient's medical conditions in detail. All questions answered  to the satisfaction of those individual(s) who also verbalized understanding of and agreement with the assessment and plan.     Dr. Olympia, Garnette RAMAN, MD, thank you for allowing us  to participate in the care of this patient.  Please contact me through the hospital if questions arise.  Dragon medical dictation software was used for portions of this report. Unintended errors may occur.     Olam Campi D.O.  [facility]

## 2024-04-26 NOTE — Progress Notes (Signed)
 Vitals, labs, micro reviewed. Went to OR yesterday for debridement, cultures sent. Tmax 100.6 in last 24 hours.  WBC normal. Continue vancomycin , cefepime  and Flagyl  and follow-up OR cultures.  Please call if any ID related questions.    Ann Olden, DO  Infectious Disease  [facility]

## 2024-04-27 LAB — POC GLUCOSE, FINGERSTICK
POC Glucose: 95 mg/dL (ref 65–105)
POC Glucose: 73 mg/dL (ref 65–105)
POC Glucose: 82 mg/dL (ref 65–105)
POC Glucose: 83 mg/dL (ref 65–105)

## 2024-04-27 LAB — RENAL FUNCTION PANEL
Albumin: 1.7 g/dL — ABNORMAL LOW (ref 3.4–5.0)
Anion Gap: 5 mmol/L (ref 5–15)
BUN: 16 mg/dL (ref 9–23)
CO2: 29 meq/L (ref 20–31)
Calcium: 8.4 mg/dL — ABNORMAL LOW (ref 8.7–10.4)
Chloride: 103 meq/L (ref 98–107)
Creatinine: 0.72 mg/dL (ref 0.55–1.02)
GFR African American: >60
GFR Non-African American: >60
Glucose: 73 mg/dL — ABNORMAL LOW (ref 74–106)
Phosphorus: 2.7 mg/dL (ref 2.4–5.1)
Potassium: 4.4 meq/L (ref 3.5–5.1)
Sodium: 137 meq/L (ref 136–145)

## 2024-04-27 LAB — CBC WITH AUTO DIFFERENTIAL
Basophils: 0.6 % (ref 0–3)
Eosinophils: 2.6 % (ref 0–5)
Hematocrit: 23.8 % — ABNORMAL LOW (ref 35.0–47.0)
Hemoglobin: 7.5 g/dL — ABNORMAL LOW (ref 11.0–16.0)
Immature Granulocytes %: 0.6 % (ref 0.0–3.0)
Lymphocytes: 13.9 % — ABNORMAL LOW (ref 28–48)
MCH: 29.2 pg (ref 25.4–34.6)
MCHC: 31.5 g/dL (ref 30.0–36.0)
MCV: 92.6 fL (ref 80.0–98.0)
MPV: 9.4 fL (ref 6.0–10.0)
Monocytes: 10.1 % (ref 1–13)
Neutrophils Segmented: 72.2 % — ABNORMAL HIGH (ref 34–64)
Nucleated RBCs: 0 (ref 0–0)
Platelets: 513 1000/mm3 — ABNORMAL HIGH (ref 140–450)
RBC: 2.57 M/uL — ABNORMAL LOW (ref 3.60–5.20)
RDW: 54.1 — ABNORMAL HIGH (ref 36.4–46.3)
WBC: 9 1000/mm3 (ref 4.0–11.0)

## 2024-04-27 LAB — VANCOMYCIN LEVEL, TROUGH: Vancomycin Tr: 12.7 ug/mL — ABNORMAL HIGH (ref 5.0–10.0)

## 2024-04-27 MED ORDER — CARVEDILOL 6.25 MG PO TABS
6.25 | Freq: Two times a day (BID) | ORAL | Status: DC
Start: 2024-04-27 — End: 2024-05-02
  Administered 2024-04-27 – 2024-05-02 (×10): 12.5 mg via ORAL

## 2024-04-27 MED ORDER — AMLODIPINE BESYLATE 5 MG PO TABS
5 | Freq: Every day | ORAL | Status: DC
Start: 2024-04-27 — End: 2024-05-02
  Administered 2024-04-27 – 2024-05-02 (×6): 5 mg via ORAL

## 2024-04-27 MED ORDER — VALSARTAN 40 MG PO TABS
40 | Freq: Two times a day (BID) | ORAL | Status: DC
Start: 2024-04-27 — End: 2024-05-02
  Administered 2024-04-27 – 2024-05-02 (×11): 160 mg via ORAL

## 2024-04-27 MED ORDER — VANCOMYCIN (VANCOCIN) 1000 MG IN SODIUM CHLORIDE 0.9% 250 ML IVPB
Status: DC
Start: 2024-04-27 — End: 2024-04-28
  Administered 2024-04-28: 15:00:00 1000 mg via INTRAVENOUS

## 2024-04-27 MED ORDER — VANCOMYCIN INTERMITTENT DOSING (PLACEHOLDER)
Freq: Once | INTRAVENOUS | Status: DC
Start: 2024-04-27 — End: 2024-04-28

## 2024-04-27 MED FILL — SODIUM-POTASSIUM-PHOSPHORUS 160-280-250 MG PO PACK: 160-280-250 mg | ORAL | Qty: 1 | Fill #0

## 2024-04-27 MED FILL — ASPIRIN LOW DOSE 81 MG PO TBEC: 81 mg | ORAL | Qty: 1 | Fill #0

## 2024-04-27 MED FILL — AMLODIPINE BESYLATE 5 MG PO TABS: 5 mg | ORAL | Qty: 1 | Fill #0

## 2024-04-27 MED FILL — CARVEDILOL 6.25 MG PO TABS: 6.25 mg | ORAL | Qty: 2 | Fill #0

## 2024-04-27 MED FILL — VANCOMYCIN (VANCOCIN) 1000 MG IN SODIUM CHLORIDE 0.9% 250 ML IVPB: Qty: 250 | Fill #0

## 2024-04-27 MED FILL — ADMELOG 100 UNIT/ML IJ SOLN: 100 [IU]/mL | INTRAMUSCULAR | Qty: 1 | Fill #0

## 2024-04-27 MED FILL — VALSARTAN 40 MG PO TABS: 40 mg | ORAL | Qty: 4 | Fill #0

## 2024-04-27 MED FILL — CEFEPIME HCL 1 G IJ SOLR: 1 g | INTRAMUSCULAR | Qty: 1000 | Fill #0

## 2024-04-27 MED FILL — METRONIDAZOLE 500 MG/100ML IV SOLN: 500 MG/100ML | INTRAVENOUS | Qty: 100 | Fill #0

## 2024-04-27 MED FILL — OXYCODONE-ACETAMINOPHEN 7.5-325 MG PO TABS: 7.5-325 mg | ORAL | Qty: 1 | Fill #0

## 2024-04-27 MED FILL — CLOPIDOGREL BISULFATE 75 MG PO TABS: 75 mg | ORAL | Qty: 1 | Fill #0

## 2024-04-27 MED FILL — HEPARIN SODIUM (PORCINE) 5000 UNIT/ML IJ SOLN: 5000 [IU]/mL | INTRAMUSCULAR | Qty: 1 | Fill #0

## 2024-04-27 MED FILL — LANTUS 100 UNIT/ML SC SOLN: 100 [IU]/mL | SUBCUTANEOUS | Qty: 1 | Fill #0

## 2024-04-27 MED FILL — ATORVASTATIN CALCIUM 40 MG PO TABS: 40 mg | ORAL | Qty: 1 | Fill #0

## 2024-04-27 NOTE — Progress Notes (Signed)
 Vitals, labs, micro reviewed. Afebrile.  WBC normal. Continue vancomycin , cefepime  and Flagyl  and follow-up OR cultures-so far NGTD.  Please call if any ID related questions.     Ann Olden, DO  Infectious Disease  [facility]

## 2024-04-27 NOTE — Progress Notes (Signed)
 Patient is s/p Debridement and graft application on bilateral LE. wound vac applied to right leg    Wound care   Right foot- continue wound vac applied  Left foot- 4x4 gauze, kirlex and ace to be changed every other day. Leave dressing intact under outer dressing     Limit wb to bilateral LE in surgical shoe with assistance of cane   Follow up with ID on intraop wound cultures   May follow up with me in clinic one week after discharge   No further podiatric plans at this time   Ok to dc from podiatry standpoint

## 2024-04-27 NOTE — Progress Notes (Signed)
 Assessment/Plan:     Patient seen for complicated medical problems as listed below :    Right foot abscess   Left foot abscess  S/p I and D in ER  PAD bilateral lower extremities  Chronic hypoxic resp failure  COPD  Possible pneumonia  Plan     Hyperkalemia resolved, DC Veltassa .  Status post angiogram, discussed with Dr. Maree, wound VAC removed, tissue clean, good bleeding.  Greatly appreciate assist.  CT showed bilateral foot abscess.  7/17 s/p OR with I&D and placement of wound VAC on right foot-OR cultures negative so far and awaiting bone path. 7/19 CT still showing multiple abscesses in the right midfoot and subcu area.  Left foot also showed multiple abscess, status post debridement of right ankle as well as I&D of left foot abscess, follow OR culture  Patient hyperkalemia resolved.  CTA lower extremities showing moderate PAD  Continue empiric IV vancomycin /cefepime /Flagyl .  Vanco trough  18.9, dose vancomycin  accordingly.    Continue bronchodilators  O2 supplementation  Dvt ppx with heparin  subcu  Discussed with patient   Continue home regimen for otherwise chronic, stable medical conditions as noted above.   I will order CBC and renal function for tomorrow    Reason for continued hospitalization: Follow OR culture    EXAM:  GENERAL: in mild distress.   RESPIRATORY: Bilateral BS present. No rales or rhonchi.   CARDIOVASCULAR: S1 and S2 present, regular. No murmur  ABDOMEN: Soft and nontender with positive bowel sounds.   NEUROLOGICAL: Cranial nerves II through XII grossly intact. No focal weakness.     Subjective:         Vitals:    04/27/24 0306 04/27/24 0501 04/27/24 0807 04/27/24 1128   BP:  (!) 165/82 (!) 153/79 (!) 165/87   Pulse:  89 74 78   Resp: 17 19 18 19    Temp:  99.8 F (37.7 C) 98.4 F (36.9 C) 99 F (37.2 C)   TempSrc:  Temporal Temporal Temporal   SpO2:  100% 99% 98%   Weight:       Height:           Recent Labs     04/27/24  0509   WBC 9.0   HGB 7.5*   HCT 23.8*   MCV 92.6   PLT 513*      Recent Labs     04/27/24  0509   NA 137   K 4.4   CL 103   CO2 29   ANIONGAP 5   CALCIUM  8.4*   BUN 16   CREATININE 0.72   GLUCOSE 73*     No results for input(s): LABALBU, TP, AST, ALKPHOS, BILITOT, BILIDIR, ALT, GLOB in the last 72 hours.    Invalid input(s): AGRAT  Lab Results   Component Value Date/Time    COLORU Yellow 04/15/2024 03:02 AM    CLARITYU Clear 04/15/2024 03:02 AM    GLUCOSEU Negative 04/15/2024 03:02 AM    BILIRUBINUR Negative 04/15/2024 03:02 AM    KETUA Negative 04/15/2024 03:02 AM    BLOODU Trace-lysed 04/15/2024 03:02 AM    PHUR 5.5 04/15/2024 03:02 AM    NITRU Negative 04/15/2024 03:02 AM    LEUKOCYTESUR Trace 04/15/2024 03:02 AM     Recent Labs     04/25/24  1103 04/25/24  2334 04/26/24  0842 04/26/24  1351 04/26/24  1838 04/26/24  2045 04/27/24  0809 04/27/24  1129   POCGLU 229* 116* 109* 115* 97 95 73 82  Discussed with patient and family regarding management, prognosis, treatment and complications of the patient's medical conditions in detail. All questions answered  to the satisfaction of those individual(s) who also verbalized understanding of and agreement with the assessment and plan.     Dr. [place]ia, Garnette RAMAN, MD, thank you for allowing us  to participate in the care of this patient.  Please contact me through the hospital if questions arise.  Dragon medical dictation software was used for portions of this report. Unintended errors may occur.     Olam Campi D.O.  [facility]

## 2024-04-27 NOTE — Progress Notes (Signed)
 [facility] Dosing Services: Vancomycin  Progress Note    This consult is provided for this 73 y.o. year old female for vancomycin  dosing.    Vancomycin  indication: Bone and joint infection    Day of therapy: 10    Ht Readings from Last 1 Encounters:   04/25/24 1.702 m (5' 7)        Wt Readings from Last 1 Encounters:   04/25/24 81.6 kg (179 lb 14.3 oz)        Previous Regimen Vancomycin  1000 mg Q24hr    Last Level ~19 hr level 12.7, extrapolated to 11.1    Other Current Antibiotics Cefepime /Flagyl        Serum Creatinine Lab Results   Component Value Date/Time    CREATININE 0.74 04/26/2024 05:25 AM      Creatinine Clearance Estimated Creatinine Clearance: 77 mL/min (based on SCr of 0.74 mg/dL).   BUN Lab Results   Component Value Date/Time    BUN 19 04/26/2024 05:25 AM      WBC Lab Results   Component Value Date/Time    WBC 9.6 04/26/2024 05:25 AM      H/H Lab Results   Component Value Date/Time    HGB 7.4 04/26/2024 05:25 AM      Platelets Lab Results   Component Value Date/Time    PLT 511 04/26/2024 05:25 AM      Temp 99.6 F (37.6 C) (Temporal)     Trough goal: 15-20 mcg/mL  AUC/MIC goal: 500-600 mcg*hr/mL    Plan and Dose Adjustments: Vancomycin  level is subtherapeutic. Will adjust regimen to 1000 ng Q18h, should yield level of 16.4 AUC >500. Will obtain repeat level 7/29 at 2330.    Continue to monitor. Pharmacy to follow daily and will make changes to dose and/or frequency based on clinical status.    Thank you for this consult.    Sheryll Leontine Molt, PharmD

## 2024-04-28 LAB — CBC WITH AUTO DIFFERENTIAL
Basophils: 0.5 % (ref 0–3)
Eosinophils: 2.3 % (ref 0–5)
Hematocrit: 26.9 % — ABNORMAL LOW (ref 35.0–47.0)
Hemoglobin: 8.3 g/dL — ABNORMAL LOW (ref 11.0–16.0)
Immature Granulocytes %: 0.8 % (ref 0.0–3.0)
Lymphocytes: 14.7 % — ABNORMAL LOW (ref 28–48)
MCH: 29.2 pg (ref 25.4–34.6)
MCHC: 30.9 g/dL (ref 30.0–36.0)
MCV: 94.7 fL (ref 80.0–98.0)
MPV: 9.9 fL (ref 6.0–10.0)
Monocytes: 9.6 % (ref 1–13)
Neutrophils Segmented: 72.1 % — ABNORMAL HIGH (ref 34–64)
Nucleated RBCs: 0 (ref 0–0)
Platelets: 560 1000/mm3 — ABNORMAL HIGH (ref 140–450)
RBC: 2.84 M/uL — ABNORMAL LOW (ref 3.60–5.20)
RDW: 54.4 — ABNORMAL HIGH (ref 36.4–46.3)
WBC: 8.5 1000/mm3 (ref 4.0–11.0)

## 2024-04-28 LAB — RENAL FUNCTION PANEL
Albumin: 1.9 g/dL — ABNORMAL LOW (ref 3.4–5.0)
Anion Gap: 8 mmol/L (ref 5–15)
BUN: 15 mg/dL (ref 9–23)
CO2: 29 meq/L (ref 20–31)
Calcium: 8.2 mg/dL — ABNORMAL LOW (ref 8.7–10.4)
Chloride: 99 meq/L (ref 98–107)
Creatinine: 0.76 mg/dL (ref 0.55–1.02)
GFR African American: >60
GFR Non-African American: >60
Glucose: 71 mg/dL — ABNORMAL LOW (ref 74–106)
Phosphorus: 3.4 mg/dL (ref 2.4–5.1)
Potassium: 4.5 meq/L (ref 3.5–5.1)
Sodium: 136 meq/L (ref 136–145)

## 2024-04-28 LAB — POC GLUCOSE, FINGERSTICK
POC Glucose: 94 mg/dL (ref 65–105)
POC Glucose: 81 mg/dL (ref 65–105)
POC Glucose: 134 mg/dL — ABNORMAL HIGH (ref 65–105)

## 2024-04-28 MED FILL — LANTUS 100 UNIT/ML SC SOLN: 100 [IU]/mL | SUBCUTANEOUS | Qty: 1 | Fill #0

## 2024-04-28 MED FILL — ATORVASTATIN CALCIUM 40 MG PO TABS: 40 mg | ORAL | Qty: 1 | Fill #0

## 2024-04-28 MED FILL — VALSARTAN 40 MG PO TABS: 40 mg | ORAL | Qty: 4 | Fill #0

## 2024-04-28 MED FILL — HEPARIN SODIUM (PORCINE) 5000 UNIT/ML IJ SOLN: 5000 [IU]/mL | INTRAMUSCULAR | Qty: 1 | Fill #0

## 2024-04-28 MED FILL — METRONIDAZOLE 500 MG/100ML IV SOLN: 500 MG/100ML | INTRAVENOUS | Qty: 100 | Fill #0

## 2024-04-28 MED FILL — OXYCODONE-ACETAMINOPHEN 7.5-325 MG PO TABS: 7.5-325 mg | ORAL | Qty: 1 | Fill #0

## 2024-04-28 MED FILL — CEFEPIME HCL 1 G IJ SOLR: 1 g | INTRAMUSCULAR | Qty: 1000 | Fill #0

## 2024-04-28 MED FILL — MORPHINE SULFATE 2 MG/ML IJ SOLN: 2 mg/mL | INTRAMUSCULAR | Qty: 1 | Fill #0

## 2024-04-28 MED FILL — SODIUM-POTASSIUM-PHOSPHORUS 160-280-250 MG PO PACK: 160-280-250 mg | ORAL | Qty: 1 | Fill #0

## 2024-04-28 MED FILL — CLOPIDOGREL BISULFATE 75 MG PO TABS: 75 mg | ORAL | Qty: 1 | Fill #0

## 2024-04-28 MED FILL — VANCOMYCIN (VANCOCIN) 1000 MG IN SODIUM CHLORIDE 0.9% 250 ML IVPB: Qty: 250 | Fill #0

## 2024-04-28 MED FILL — ASPIRIN LOW DOSE 81 MG PO TBEC: 81 mg | ORAL | Qty: 1 | Fill #0

## 2024-04-28 MED FILL — CARVEDILOL 6.25 MG PO TABS: 6.25 mg | ORAL | Qty: 2 | Fill #0

## 2024-04-28 MED FILL — AMLODIPINE BESYLATE 5 MG PO TABS: 5 mg | ORAL | Qty: 1 | Fill #0

## 2024-04-28 MED FILL — ADMELOG 100 UNIT/ML IJ SOLN: 100 [IU]/mL | INTRAMUSCULAR | Qty: 1 | Fill #0

## 2024-04-28 NOTE — Progress Notes (Signed)
 INFECTIOUS DISEASE FOLLOW UP NOTE     Date of admission: 04/14/2024     Date of consult: April 15, 2024        ABX:      Current abx Prior abx    Cefepime  7/14-14  Flagyl  7/15-13 Vanco 7/14-14      ASSESSMENT:       Right foot abscess  -Unclear cause of swelling and pain that has been present for several months  -Has been evaluated by orthopedic  -Patient seen by [facility] specialist Dr. Curtistine Emperor with last visit 03/11/2024 for right contusion of dorsum of foot, lower extremity edema and right foot pronation deformity  -03/08/2024 MRI right hindfoot no fracture.  Mild multifocal osseous edema?  Altered gait.  And mechanical stress-related changes.  Mild intrinsic foot muscular atrophic and intramuscular edema.  Subcu edema along the dorsum of the foot  -xray no fracture  -S/p I&D by ED, cultures unrevealing  - Patient seen by podiatrist Dr. Roberts, OR I and D done 7/17, cultures unrevealing, path consistent with acute on chronic osteomyelitis     Left foot swelling  -Xray no fracture but CT concerning for abscess  - Status post OR I and D 7/25   Leucocytosis POA   AKI/CKD  -Abnormal creatinine few weeks ago on routine blood work at [facility]   PAD  -Patient has been following with vascular surgeon and last seen in office on 04/08/2024  -12/2023 ABI at [facility] with severe left lower extremity arterial insufficiency at rest and right first digital disease with known proximal arterial insufficiency  -12/2023 s/p right lower extremity angiogram showed single-vessel peroneal runoff with good branches   Ch hypoxic resp failure   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  -Has been following with neurosurgery at [facility] artery aneurysm  -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 and following with  [facility] neurosurgery with plan for continued management   Ax- PCN -as a child   Co-morb- COPD, schizoaffective disorder, HTN, depression,             RECOMMENDATIONS:      Complicated 73 year old female with significant comorbidities coming with bilateral feet swelling, right worse than left    Postop temp 100.6, lower since, past 24 hours Tmax 99.1, other VSS, 97% room air sat  Labs include creatinine 0.76 BS 134 WBC 8.5 Hgb 8.3 PLT 560  Micro 7/25 fungal and AFB left foot IP, anaerobic NGTD, routine NG 3 days; 7/17 wound right foot Gram stain negative, culture NG, ANA NG, right heel bone Gram stain negative, culture negative, ANA NG, 7/15 urine less than 10k, superficial wound culture negative, 7/14 blood culture x 2 negative  Imaging 7/21 IR angio  Path 7/17 right heel acute and chronic osteomyelitis       Assessment     Right foot abscess  - Coming with swelling, pain of right foot   - I&D in ED by provider and cultures negative  - CT concerning for abscess on right foot  - 7/17 s/p OR with I&D and placement of wound VAC -or cultures negative   - Path suggestive of acute and chronic osteomyelitis   - 7/19 CT still showing multiple abscesses in the midfoot and subcu area  - NEW 7/25 debridement right foot with graft application application of wound VAC by Dr. Rosamond    Left foot swelling  - Tenderness on dorsal aspect  -  7/17 CT suggestive of cellulitis with focal area of collection?  Abscess and CTA 7/19 poorly defined collections foot and subcu left knee  - Covering with broad-spectrum antibiotic  - NEW OR drainage of abscess left foot on 7/25 with right foot surgery  - Routine and anaerobic culture no growth to date, AFB and fungal added today     PAD  -Previous history of angio but recent ABI with monophasic waveforms in femoral and plan for angio per vascular  -Vascular study 7/16 moderate right iliac-femoral, severe left arterial insufficiency at femoropopliteal region  -s/p angio 7/21 RLE with right SFA  intervention     Bilateral leg edema  - Recent PVL 7/8 was negative for DVT but low threshold to repeat     Leukocytosis likely reactive and monitor     AKI?  CKD  - Avoid nephrotoxic medications and dose for current creatinine clearance  - Renal ultrasound without any stone or obstruction    Other details as above       Rec:  - Continue cefepime  and Flagyl  for now  - With all cultures this admission to date negative for MRSA and Enterococcus will discontinue vancomycin  to avoid potential nephrotoxicity  -Unclear duration on antibiotics at this point but right heel path showed osteomyelitis so may need extended course,?  Cefepime  plus metronidazole , so far no positive cultures to guide antibiotic selection, patient with penicillin allergy but tolerating current therapy  - Monitor pending cultures from left foot suspected abscess, added AFB and fungal cultures today and discussion with microbiology and nursing  -Local wound care per others  - Podiatry follow up  - Continue to monitor left knee with question left knee fluid collection on CTA  - Keep legs elevated on pillows  - Monitor serial labs  - Monitor for any ADE    MICROBIOLOGY:      7/14 Blcx x2 NG              Wd cx NG  7/15 Ucx less than 10K UG F  7/17 anaerobic x 2 NG wound NOS, NG surgical wound NG right heel  7/25 anaerobic no growth to date  Routine Gram stain rare WBC NOS, NGTD  AFB IP  Fungal IP     LINES AND CATHETERS:      PIV     SUMMARY:      Susan [name] is a 73 year old  female with past medical history of COPD, schizoaffective disorder, hypertension, depression, PAD, peripheral neuropathy was admitted on 7/14 with worsening leg pain.     Patient is not a good historian and says lives with her daughter.  She has been almost bedbound for quite some time. Pt has been following with [facility] for basilar artery aneurysm and conservative Mx.  Patient also developed right leg swelling for which she was referred to Ortho and eventually podiatry.  It was not  thought to be the part of her back leading to right leg swelling and neuropathy.  Patient also following with vascular and had procedure done in March with good runoff and did not think was concern for pain and swelling.  Patient last seen by podiatry 03/11/2024 and plan for conservative management.      She was seen in ED on 8 July with leg swelling.  Workup showed negative PVL for DVT and chest x-ray without any significant finding.  She was noted with pitting edema and was given some Lasix  and asked to follow-up with PCP for adjustment in  medications.  Patient returned 7/14 with worsening swelling of the legs, right worse than left.  Workup showed stable vitals, afebrile state.  WBC elevated at 19, Hb 9.8, PLT 354, BUN 34, CR 1.43, AST 17, ALT 14.  Blood cultures were obtained.  Patient had bilateral feet x-ray which showed no fracture or concern for osteo.  Chest x-ray was suggestive of left basilar opacity with developing pneumonia versus atelectasis.  Patient had I&D in the ED.  Podiatry was consulted and CT was ordered.  Wound culture in progress.  Patient on broad-spectrum antibiotic with cefepime  and Vanco.  ID was asked for further evaluation management.       SUBJECTIVE :     Interval notes reviewed.  Patient says pain comes and goes, overall improved, denied fevers chills nausea vomiting diarrhea, discussed with her with no MRSA in cultures will discontinue vancomycin  and discussed with microbiology adding AFB and fungal studies 2 OR cultures from 7/25.    OBJECTIVE     BP (!) 160/85   Pulse 78   Temp 97.5 F (36.4 C) (Temporal)   Resp 20   Ht 1.702 m (5' 7)   Wt 81.6 kg (179 lb 14.3 oz)   SpO2 100%   BMI 28.18 kg/m     Temp (24hrs), Avg:98.2 F (36.8 C), Min:97.2 F (36.2 C), Max:99 F (37.2 C)    General: Fairly developed and nourished 74 y.o. year-old, female, chronically sick  HEENT: Normocephalic, anicteric sclerae, Pupils equal, round reactive to light, no oropharyngeal lesions. No  sinus tenderness.  Neck: Supple, no lymphadenopathy, masses or thyromegaly  Chest: Symmetrical expansion  Lungs: Clear to auscultation bilaterally, no dullness  Heart: Regular rhythm, no murmur, no rub or gallop, No JVD  Abdomen: Soft, non-tender,non distended, no organomegaly, BS+  Musculoskeletal: Right foot wound VAC in place wd care image today as below, left foot postop dressing clean dry intact not removed  CNS: AAOx3.  Follows simple commands.  SKIN: No obvious skin lesion or rash. Dry, warm, intact        Media Information      Document Information    Wound Care Image: Wound      04/28/2024 10:47   Attached To:   Hospital Encounter on 04/14/24     MEDICATIONS:     Current Facility-Administered Medications   Medication Dose Route Frequency Provider Last Rate Last Admin    valsartan  (DIOVAN ) tablet 160 mg  160 mg Oral BID Josepha Planas, DO   160 mg at 04/27/24 2101    amLODIPine  (NORVASC ) tablet 5 mg  5 mg Oral Daily Josepha Planas, DO   5 mg at 04/27/24 1402    carvedilol  (COREG ) tablet 12.5 mg  12.5 mg Oral BID WC Josepha Planas, DO   12.5 mg at 04/27/24 1732    potassium & sodium phosphates  (PHOS-NAK) 280-160-250 MG packet 250 mg  1 packet Oral 4x Daily Josepha Planas, DO   250 mg at 04/27/24 2102    vancomycin  (VANCOCIN ) 1000 mg in sodium chloride  0.9% 250 mL IVPB  1,000 mg IntraVENous Q18H Matriano, Norleen Deward DEL, MD 250 mL/hr at 04/27/24 1901 Restarted at 04/27/24 1901    [START ON 04/29/2024] **Vancomycin  level on 7/29 at 2230  1 each Other Once Matriano, Norleen Deward DEL, MD        lidocaine  PF 1 % injection 1 mL  1 mL IntraDERmal Once Vyas, Shruti, DPM        dextrose  50 % IV solution  10-50  mL IntraVENous PRN Josepha Planas, DO   50 mL at 04/25/24 1000    glucagon  injection 1 mg  1 mg IntraMUSCular PRN Josepha Planas, DO        insulin  glargine (LANTUS ) injection vial 1-100 Units  1-100 Units SubCUTAneous QHS Josepha Planas, DO   5 Units at 04/26/24 2319    insulin  lispro (HUMALOG ,ADMELOG ) injection vial 1-100 Units  1-100 Units  SubCUTAneous 4x Daily AC & HS Josepha Planas, DO   2 Units at 04/25/24 1131    insulin  lispro (HUMALOG ,ADMELOG ) injection vial 1-100 Units  1-100 Units SubCUTAneous PRN Josepha Planas, DO        cefepime  (MAXIPIME ) 1,000 mg in sodium chloride  0.9 % 50 mL IVPB (mini-bag)  1,000 mg IntraVENous Q8H Matriano, Norleen Deward DEL, MD 12.5 mL/hr at 04/28/24 0425 1,000 mg at 04/28/24 0425    atorvastatin  (LIPITOR ) tablet 40 mg  40 mg Oral Nightly Maree Ladona HERO, MD   40 mg at 04/27/24 2055    aspirin  EC tablet 81 mg  81 mg Oral Daily Maree Ladona HERO, MD   81 mg at 04/27/24 9041    clopidogrel  (PLAVIX ) tablet 75 mg  75 mg Oral Daily Maree Ladona HERO, MD   75 mg at 04/27/24 9041    albuterol  (PROVENTIL ) (2.5 MG/3ML) 0.083% nebulizer solution 2.5 mg  2.5 mg Nebulization Q4H PRN Sahibzada, Ayaz, MD        ipratropium 0.5 mg-albuterol  2.5 mg (DUONEB ) nebulizer solution 1 Dose  1 Dose Inhalation Q4H PRN Sahibzada, Ayaz, MD        senna (SENOKOT) tablet 8.6 mg  1 tablet Oral Daily PRN Matriano, Norleen Deward DEL, MD        docusate sodium  (COLACE) capsule 100 mg  100 mg Oral BID PRN Matriano, Norleen Deward DEL, MD        ondansetron  (ZOFRAN ) injection 4 mg  4 mg IntraVENous Q6H PRN Matriano, John Paul H, MD   4 mg at 04/21/24 1544    acetaminophen  (TYLENOL ) tablet 650 mg  650 mg Oral Q6H PRN Matriano, John Paul H, MD   650 mg at 04/26/24 0239    melatonin tablet 3 mg  3 mg Oral Nightly PRN Matriano, Norleen Deward DEL, MD        naloxone  (NARCAN ) injection 0.4 mg  0.4 mg IntraVENous PRN Matriano, John Paul H, MD        heparin  (porcine) injection 5,000 Units  5,000 Units SubCUTAneous BID Sahibzada, Ayaz, MD   5,000 Units at 04/27/24 2103    benzonatate  (TESSALON ) capsule 100 mg  100 mg Oral TID PRN Matriano, Norleen Deward DEL, MD        *Pharmacy to Dose Vancomycin   1 each Other RX Placeholder Matriano, Norleen Deward DEL, MD        morphine  (PF) injection 1 mg  1 mg IntraVENous Q3H PRN Matriano, John Paul H, MD   1 mg at 04/23/24 0557    sodium hypochlorite (DAKINS) 0.125 %  external solution   Irrigation Daily Anastasio Cain A, DPM        metroNIDAZOLE  (FLAGYL ) 500 mg in 0.9% NaCl 100 mL IVPB premix  500 mg IntraVENous Q8H Desai, Aarti S, MD 100 mL/hr at 04/28/24 0211 500 mg at 04/28/24 0211    oxyCODONE -acetaminophen  (PERCOCET ) 7.5-325 MG per tablet 1 tablet  1 tablet Oral Q4H PRN Josepha Planas, DO   1 tablet at 04/28/24 0211         Labs: Results:  Chemistry Recent Labs     04/25/24  0934 04/26/24  0525 04/27/24  0509   NA 137 138 137   K 5.1  5.1 4.5 4.4   CL 103 105 103   CO2 28 28 29    BUN 29* 19 16      CBC w/Diff Recent Labs     04/26/24  0525 04/27/24  0509 04/28/24  0658   WBC 9.6 9.0 8.5   RBC 2.50* 2.57* 2.84*   HGB 7.4* 7.5* 8.3*   HCT 23.8* 23.8* 26.9*   PLT 511* 513* 560*          RADIOLOGY :        Imaging  CTA ABDOMINAL AORTA W BILAT RUNOFF W WO CONTRAST  Addendum Date: 04/24/2024  HISTORY: peripheral artery disease Technique: Noncontrast CT imaging is acquired through the chest for bolus tracking purposes. Enhanced CT imaging through the abdomen, pelvis, and lower extremities is performed during arterial phase of IV contrast menstruation as part of a CTA runoff. Angiographic reconstructions are performed formed at a separate workstation. The patient received an uneventful IV bolus of 100 mL of Omnipaque 350 nonionic contrast as part of the study. 3D angiographic post processing and image rendering was performed by the CT technologist at a separate workstation including coronal and sagittal maximal intensity projection (MIP) reconstructions. COMPARISON STUDY: none FINDINGS: Vascular structures and lower extremities: Abdominal aorta is normal in caliber and enhancement. Celiac, SMA, and renal arteries are moderately calcified yet patent with no high-grade stenosis. IMA is patent. Mild narrowing of the left common iliac artery. The right common iliac and external iliac arteries are patent. Hypogastric arteries are patent. On the right, common femoral and profunda are  patent. Diffuse mild disease of the right SFA with moderate grade narrowing in the mid SFA just proximal to the patent stent. The right popliteal artery is patent. Single-vessel runoff to the right foot through the peroneal artery. There is gradual diminished enhancement in the anterior and posterior tibial arteries. The ankle. Ulcerations are present in the right foot with subcutaneous gas and a vacuum. 2 cm abscess in the plantar subcutaneous tissues with an air-fluid level noted. Additional 2.4 cm abscess in the dorsal subcutaneous tissues of the mid foot noted with an air-fluid level. On the left, common femoral artery is mildly narrowed secondary to plaque and the profunda is patent. Diffuse moderate disease of the left SFA with partially calcified plaque. Popliteal artery is patent. Moderate grade stenosis at the proximal posterior tibial artery. Two-vessel runoff via the posterior tibial and peroneal arteries. Diminished enhancement noted in the distal anterior tibial artery. Along the lateral aspect of the midfoot there is a 2.5 x 4.6 cm area of low density which may represent a developing abscess or phlegmon. There is also a region of edema in the anterior left lower leg at the level of the ankle. At the level of the knee, there is a irregular shaped fluid collection in the anterolateral knee just lateral to the tibial tuberosity which measures approximately 4.2 cm in diameter. Bibasilar subsegmental atelectasis. Heart size is borderline enlarged. The liver has a slight nodular contour. The gallbladder is present and contains excreted contrast. The spleen, pancreas, and adrenal glands are within normal notes. 2.6 cm left renal cyst. Kidneys are otherwise normal in morphology with no hydronephrosis. Appendix and small bowel loops are unremarkable. Diverticulosis is noted throughout the colon. No free intraperitoneal fluid or air. Uterus contains a coarsely calcified leiomyoma. No adnexal  or pelvic mass.  Urinary bladder is unremarkable. Osseous structures the pelvis and lumbar spine are intact. No compression deformities. IMPRESSION: 1. No aortic inflow stenosis. 2. Moderate grade stenosis of the mid right SFA and patent SFA stent. Patent right popliteal artery with single vessel runoff to the right foot. 3. Diffuse moderate disease of the left SFA with patent popliteal artery and diseased two-vessel runoff to the left foot. Moderate grade stenosis in the proximal left posterior tibial artery. 4. Open wound in the right foot with subcutaneous gas and multiple abscesses in the mid foot along the plantar surface and dorsal subcutaneous tissues. 5. Poorly defined fluid collections in the left foot and in the subcutaneous tissues of the anterior left knee which are concerning for additional abscesses. Electronically signed by: Freda Lanes, MD 04/24/2024 5:46 PM EDT          Workstation ID: [id number]     Result Date: 04/24/2024  HISTORY: peripheral artery disease Technique: Noncontrast CT imaging is acquired through the chest for bolus tracking purposes. Enhanced CT imaging through the abdomen, pelvis, and lower extremities is performed during arterial phase of IV contrast menstruation as part of a CTA runoff. Angiographic reconstructions are performed formed at a separate workstation. The patient received an uneventful IV bolus of 100 mL of Omnipaque 350 nonionic contrast as part of the study. COMPARISON STUDY: none FINDINGS: Vascular structures and lower extremities: Abdominal aorta is normal in caliber and enhancement. Celiac, SMA, and renal arteries are moderately calcified yet patent with no high-grade stenosis. IMA is patent. Mild narrowing of the left common iliac artery. The right common iliac and external iliac arteries are patent. Hypogastric arteries are patent. On the right, common femoral and profunda are patent. Diffuse mild disease of the right SFA with moderate grade narrowing in the mid SFA just  proximal to the patent stent. The right popliteal artery is patent. Single-vessel runoff to the right foot through the peroneal artery. There is gradual diminished enhancement in the anterior and posterior tibial arteries. The ankle. Ulcerations are present in the right foot with subcutaneous gas and a vacuum. 2 cm abscess in the plantar subcutaneous tissues with an air-fluid level noted. Additional 2.4 cm abscess in the dorsal subcutaneous tissues of the mid foot noted with an air-fluid level. On the left, common femoral artery is mildly narrowed secondary to plaque and the profunda is patent. Diffuse moderate disease of the left SFA with partially calcified plaque. Popliteal artery is patent. Moderate grade stenosis at the proximal posterior tibial artery. Two-vessel runoff via the posterior tibial and peroneal arteries. Diminished enhancement noted in the distal anterior tibial artery. Along the lateral aspect of the midfoot there is a 2.5 x 4.6 cm area of low density which may represent a developing abscess or phlegmon. There is also a region of edema in the anterior left lower leg at the level of the ankle. At the level of the knee, there is a irregular shaped fluid collection in the anterolateral knee just lateral to the tibial tuberosity which measures approximately 4.2 cm in diameter. Bibasilar subsegmental atelectasis. Heart size is borderline enlarged. The liver has a slight nodular contour. The gallbladder is present and contains excreted contrast. The spleen, pancreas, and adrenal glands are within normal notes. 2.6 cm left renal cyst. Kidneys are otherwise normal in morphology with no hydronephrosis. Appendix and small bowel loops are unremarkable. Diverticulosis is noted throughout the colon. No free intraperitoneal fluid or air. Uterus contains a coarsely calcified leiomyoma. No  adnexal or pelvic mass. Urinary bladder is unremarkable. Osseous structures the pelvis and lumbar spine are intact. No  compression deformities.     IMPRESSION: 1. No aortic inflow stenosis. 2. Moderate grade stenosis of the mid right SFA and patent SFA stent. Patent right popliteal artery with single vessel runoff to the right foot. 3. Diffuse moderate disease of the left SFA with patent popliteal artery and diseased two-vessel runoff to the left foot. Moderate grade stenosis in the proximal left posterior tibial artery. 4. Open wound in the right foot with subcutaneous gas and multiple abscesses in the mid foot along the plantar surface and dorsal subcutaneous tissues. 5. Poorly defined fluid collections in the left foot and in the subcutaneous tissues of the anterior left knee which are concerning for additional abscesses. Electronically signed by: Daryle Darden, MD 04/19/2024 2:45 PM EDT          Workstation ID: [id number]     IR ANGIOGRAM EXTREMITY RIGHT  Result Date: 04/21/2024  Radiology exam is complete. No Radiologist dictation. Please follow up with ordering provider.     CT FOOT LEFT W CONTRAST  Result Date: 04/17/2024  HISTORY: Swelling and pain. TECHNIQUE: Helical acquisition CT images through the left foot was performed with contrast with multiplanar reformats. COMPARISON: None. FINDINGS: No visualized acute fracture or dislocation. No evidence of periosteal reaction or bony destruction to suggest osteomyelitis. There is extensive subcutaneous fat edema and stranding within the lateral soft tissues from the level of the mid foot to the forefoot. In particular, there is a prominent area of focal fluid measuring approximately 3.8 x 2.1 cm on image 20, series 5 that extends superficially to abut the skin and may represent a developing abscess. There is no definite visualized enhancing wall.     IMPRESSION: 1. Findings are compatible with cellulitis of the lateral midfoot and forefoot with more focal 3.8 cm area of fluid at the level of the midfoot that is suspicious for an early developing abscess as there is no discrete  capsule. 2. No evidence of osteomyelitis. Electronically signed by: Lillia Hug, MD 04/17/2024 11:37 AM EDT          Workstation ID: [id number]     LE ARTERIAL  Result Date: 04/16/2024                                                                                                                                    Version: 1  Study ID: [id number]                                                       [facility]                                                       [address] [place], [place]  [address]                                                           Arterial Lower Extremity Report Name: TRACE, CEDERBERG Date: 04/15/2024, 2: 67 PM MRN: [id number]                                                               Patient Location: [facility] Age: 10 Years                                                               DOB: September 01, 1951 (MM/DD/YYYY)                                                                               Gender: Female Performed By: Edilia Romelia Elden Sherrell, RVT                          Account #:: [id number] Ordering Physician: ANASTASIO CAIN A Reason For Study: Peripheral vascular disease.  INTERPRETATION SUMMARY 1. Moderate right lower extremity arterial insufficiency at the level of the iliac-femoral region. 2. Severe left lower extremity arterial insufficiency at the level of the femoral-popliteal region. 3. Digital pressures deferred due to patient's pain. QUALITY/PROCEDURE Arterial Physio ABI [id number]). A bilateral lower extremity arterial exam was performed. Continuous wave Doppler tracings were recorded from the common femoral, popliteal, posterior tibial  and dorsalis pedis arteries bilaterally, and resting ankle brachial pressures were measured. Digital pressures deferred due to patient's pain. RIGHT LEG SEGMENTAL Brachial = 131 mmHg. Ankle Pressure (PT) = 81 mmHg. Ankle Pressure (DP) = 83 mmHg. Right ABI = 0.63. The lower extremity ankle brachial index is 0.63 on the right. The right lower extremity waveforms are monophasic in the femoral, the popliteal, the posterior tibial and the dorsalis pedis arteries. RIGHT DIGIT PPG Digit 1 flow is absent. LEFT LEG SEGMENTAL Brachial = 126 mmHg. Ankle (PT) = 59 mmHg. Ankle (DP) = 63 mmHg. Left ABI = 0.48. The lower extremity ankle brachial index is 0.48 on the left. The left lower extremity waveforms are multiphasic in the femoral, and monophasic in the popliteal, the posterior tibial and the dorsalis pedis arteries. LEFT DIGIT PPG Digit 1 flow is absent. ______________________________________________________________________________ Electronically signed by: Dr Blenda Fear, M.D                       04/16/2024, 10: 01 PM     US  RETROPERITONEAL COMPLETE  Result Date: 04/15/2024  INDICATION: Acute renal failure. COMPARISON: None. TECHNIQUE: Transabdominal grayscale and duplex sonography of the retroperitoneum performed. FINDINGS: RIGHT KIDNEY:  10.7 cm in length. (Normal range 9-13 cm) Normal renal parenchymal echogenicity. No obstruction, renal calculi or focal lesion. LEFT KIDNEY: 9.8 cm in length. (Normal range 9-13 cm) Normal renal parenchymal echogenicity.  And interpolar simple-appearing partially exophytic left renal cyst measures 2.6 x 2.6 x 2.1 cm. URINARY BLADDER: Bladder has a pre-void volume of 240.0 mL. (<50 mL WNL) Patient did not void for the exam. Neither ureteral jet(s) identified. No urinary bladder wall thickening or debris within the urinary bladder. (Normal wall thickness <3 mm distended, <5 mm underdistended.) Bladder contains a Foley catheter and is decompressed. PANCREAS:  Visualized pancreas is within  normal limits. IVC: Patent. ABDOMINAL AORTA: Normal caliber abdominal aorta. COMMON ILIAC ARTERIES: Suboptimally visualized secondary to interposed bowel gas.     IMPRESSION: 1.  No hydronephrosis. Kidneys unremarkable sonographically with the exception of a partially exophytic simple-appearing interpolar left renal cyst. 2.  Urinary bladder unremarkable sonographically. 3.  Common iliac arteries above the visualized sonographically due to interposed bowel gas Electronically signed by: Alm Conte, MD 04/15/2024 8:34 PM EDT          Workstation ID: [id number]     CT FOOT RIGHT W CONTRAST  Result Date: 04/15/2024  Indication: Right foot infection. Evaluate for abscess. Comparison: None available Technique: Computed tomographic imaging of the right foot was performed following IV contrast administration. Coronal and sagittal reconstructions were created from the initial axial data set and submitted for interpretation. Findings: Complex gas and fluid containing abscess//phlegmon in the subcutaneous lateral hindfoot spanning approximately 4.4 cm craniocaudal by 2 x 1.5 cm transverse. This is positioned posterior to the peroneal tendons which are intact and not overtly involved. There is subtle loss of bony cortex/rarefaction at the lateral margin of the subjacent calcaneus concerning for osteomyelitis.     IMPRESSION: 1. 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 Electronically signed by: Glade Reeve, MD 04/15/2024 2:16 PM EDT          Workstation ID: [id number]     XR FOOT RIGHT (2 VIEWS)  Result Date: 04/15/2024  Exam: 2 views of the right foot. Clinical indications:  PAIN. RULE OUT OSTEOMYELITIS. Right foot pain and swelling. Comparison:   04/15/2024;  Findings:  No fracture.  No dislocation. No radiopaque foreign bodies. No bone erosions.     Impression:  No fracture. No bone erosions to suggest osteomyelitis. Electronically  signed by: Celine Rear, MD 04/15/2024 12:52 AM EDT          Workstation ID: [id number]     XR FOOT LEFT (2 VIEWS)  Result Date: 04/15/2024  Exam: 2 views of the left foot. Clinical indications:  PAIN right foot. Swelling. Possible osteomyelitis. Comparison:  None Findings:  No fracture.  No dislocation. No bone erosions. No radiopaque foreign bodies.     Impression:  No fracture. Electronically signed by: Celine Rear, MD 04/15/2024 12:51 AM EDT          Workstation ID: [id number]     XR CHEST PORTABLE  Result Date: 04/15/2024  Exam: AP portable chest Clinical indication: SEPSIS Comparison: 04/08/2024;  Results:  Left basilar opacity. No pneumothorax. Heart normal.  Mediastinal contours are normal. No free air is seen under the hemidiaphragms.   Osseous structures intact.     IMPRESSION: Left basilar opacity. Developing pneumonia versus atelectasis. Electronically signed by: Celine Rear, MD 04/15/2024 12:41 AM EDT          Workstation ID: [id number]     VL DUP LOWER EXTREMITY VENOUS BILATERAL  Result Date: 04/09/2024                                                                                                                                    Version: 1                                                                                                                                   Study ID: [id number]  [facility]                                                       [address] [place], [place]  [address]                                                      Lower Extremity Venous Duplex Report Name: CAREL, SCHNEE Date: 04/08/2024, 2: 49 PM MRN: [id number]                                                               Patient Location: ER^ERWT^WT^[facility] Age: 6 Years                                                                DOB: 1951-06-15 (MM/DD/YYYY)                                                                               Gender: Female Performed By: Kylee R. Holloman, RVT                                       Account #:: [id number] Ordering Physician: [facility], [facility] B Reason For Study: Lower extremity pain and swelling                                                     INTERPRETATION SUMMARY 1. No evidence of deep vein thrombosis in the bilateral lower extremities. QUALITY/PROCEDURE Complete bilateral venous duplex performed. Quality of the study is good. Location: ER. RIGHT LEG The deep venous system of  the right lower extremity was examined using duplex ultrasound. B-mode imaging demonstrated normal compressibility of the right common femoral, femoral, popliteal, posterior tibial and peroneal veins. Doppler flow signals were spontaneous and phasic at all levels. RIGHT SAPHENOUS VEINS Right great saphenous vein at the saphenofemoral junction demonstrates normal compressibility with spontaneous and phasic venous hemodynamics. LEFT LEG The deep venous system of the left lower extremity was examined using duplex ultrasound. B-mode imaging demonstrated normal compressibility of the left common femoral, femoral, popliteal, posterior tibial and peroneal veins. Doppler flow signals were spontaneous and phasic at all levels. LEFT LEG SAPHENOUS VEINS Left great saphenous vein at the saphenofemoral junction demonstrates normal compressibility with spontaneous and phasic venous hemodynamics. ______________________________________________________________________________ Electronically signed by: Dr Blenda Fear, M.D                       04/09/2024, 8: 57 AM     XR CHEST (2 VW)  Result Date: 04/08/2024  History: Shortness of breath Comparison: None Technique: PA and lateral radiographs of the chest were obtained. Two views, 2 exposures total. Findings: The lungs and pleural spaces are clear. Normal cardiomediastinal silhouette.  Degenerative changes of the thoracic spine.     IMPRESSION: No acute cardiopulmonary disease. Electronically signed by: Norleen Gleason 04/08/2024 11:20 AM EDT          Workstation ID: [id number]        I have independently reviewed lab studies and imgaing as well as review of nursing notes and physican notes from the past 24 hours.    Dragon medical dictation software was used for portions of this report. Unintended errors may occur.     Susan Julieanne Meadows, MD  April 28, 2024    [facility] Infectious Disease   Office [phone]  Fax:[phone]

## 2024-04-28 NOTE — Progress Notes (Signed)
 Assessment/Plan:     Patient seen for complicated medical problems as listed below :    Right foot abscess   Left foot abscess  S/p I and D in ER  PAD bilateral lower extremities  Chronic hypoxic resp failure  COPD  Possible pneumonia    Plan       Status post angiogram, discussed with vascular surgery.  wound VAC removed, tissue clean, good bleeding.  Greatly appreciate assist.  CT showed bilateral foot abscess.  7/17 s/p OR with I&D and placement of wound VAC on right foot-OR cultures negative so far and awaiting bone path. 7/19 CT still showing multiple abscesses in the right midfoot and subcu area.  Left foot also showed multiple abscess, status post debridement of right ankle as well as I&D of left foot abscess, follow OR culture  Patient hyperkalemia resolved.  CTA lower extremities showing moderate PAD  Continue empiric IV vancomycin /cefepime /Flagyl .  Vanco trough  18.9, dose vancomycin  accordingly..  ID consult appreciated  Continue bronchodilators  O2 supplementation  Dvt ppx with heparin  subcu  Discussed with patient   Continue home regimen for otherwise chronic, stable medical conditions as noted above.       Reason for continued hospitalization: Follow OR culture        Objective:         Vitals:    04/28/24 0241 04/28/24 0429 04/28/24 0757 04/28/24 0902   BP:  138/87 (!) 160/85    Pulse:  75 78    Resp: 18 16 20 18    Temp:  99 F (37.2 C) 97.5 F (36.4 C)    TempSrc:  Temporal Temporal    SpO2:  96% 100%    Weight:       Height:       EXAM:  GENERAL: in mild distress.   RESPIRATORY: Bilateral BS present. No rales or rhonchi.   CARDIOVASCULAR: S1 and S2 present, regular. No murmur  ABDOMEN: Soft and nontender with positive bowel sounds.   NEUROLOGICAL: Cranial nerves II through XII grossly intact. No focal weakness  Extremities, bilateral lower extremities wound. Vac on right side .     Recent Labs     04/28/24  0658   WBC 8.5   HGB 8.3*   HCT 26.9*   MCV 94.7   PLT 560*     Recent Labs      04/28/24  0658   NA 136   K 4.5   CL 99   CO2 29   ANIONGAP 8   CALCIUM  8.2*   BUN 15   CREATININE 0.76   GLUCOSE 71*     No results for input(s): LABALBU, TP, AST, ALKPHOS, BILITOT, BILIDIR, ALT, GLOB in the last 72 hours.    Invalid input(s): AGRAT  Lab Results   Component Value Date/Time    COLORU Yellow 04/15/2024 03:02 AM    CLARITYU Clear 04/15/2024 03:02 AM    GLUCOSEU Negative 04/15/2024 03:02 AM    BILIRUBINUR Negative 04/15/2024 03:02 AM    KETUA Negative 04/15/2024 03:02 AM    BLOODU Trace-lysed 04/15/2024 03:02 AM    PHUR 5.5 04/15/2024 03:02 AM    NITRU Negative 04/15/2024 03:02 AM    LEUKOCYTESUR Trace 04/15/2024 03:02 AM     Recent Labs     04/26/24  1351 04/26/24  1838 04/26/24  2045 04/27/24  0809 04/27/24  1129 04/27/24  1723 04/27/24  2031 04/28/24  0800   POCGLU 115* 97 95 73 82 83 94 81  Discussed with patient and family regarding management, prognosis, treatment and complications of the patient's medical conditions in detail. All questions answered  to the satisfaction of those individual(s) who also verbalized understanding of and agreement with the assessment and plan.     Dr. [place], Garnette RAMAN, MD, thank you for allowing us  to participate in the care of this patient.  Please contact me through the hospital if questions arise.  Dragon medical dictation software was used for portions of this report. Unintended errors may occur.     Sergio callander MD  [facility] Hospitalists  [phone]

## 2024-04-28 NOTE — Wound Image (Signed)
 Situation: Wound/Ostomy Consult    Background: Braden score - 16/23. According to the medical record, patient has a PMHx of peripheral artery disease, COPD, chronic respiratory failure and uses 2 L home oxygen. Patient admitted with foot abscess. On 04/17/24, patient had right heel irrigation and debridement with wound vac placement. On 04/25/24, patient had debridement of right foot with graft application and wound vac; drainage of abscess of left foot. See H&P/MD notes for full patient history.    Assessment: Patient seen in room #: 5212. Patient lying in bed alert and oriented. Music played for patient to assist with managing her pain. Right foot with 2+ edema. NPWT dressing intact to right lateral foot wound. There is 50cc serosanguinous drainage in the canister. Dressing removed without difficulty. Patient with adaptic dressing stapled over wound and I am unable to visualize the wound bed. Right foot cleansed and NPWT reapplied.   Medial aspect of 2nd toe on right foot with a partial thickness wound. Aquacel Ag applied. There are 2 incisions on right dorsal foot draining serous fluid. Aquacel Ag applied. The dressings on these wounds can be change during wound vac dressing changes. Kerlix and acr wrap applied to right foot.     Recommendation:   LOCATION: RIGHT LATERAL FOOT - Thin duoderm applied to periwound to protect healthy skin and assist in maintaining a tight seal. Granufoam dressing cut to fit and placed into wound, then bridged to right dorsum before connecting to 160mm/Hg continuous neg pressure using KCI wound VAC. No leak noted. Pt tolerated with no complaints of pain. Will continue to follow for dressing changes MWF.    LOCATION: LEFT FOOT - Nurses please follow Podiatry order: Left foot- 4x4 gauze, kerlix and ace to be changed every other day. Leave dressing intact under outer dressing.     Thank you,      Eleanor Carol, BSN, RN   [facility]   Inpatient Wound, Ostomy,  Continence Nurse

## 2024-04-28 NOTE — Progress Notes (Signed)
 PHYSICAL THERAPY RE-EVALUATION/TREATMENT   Time  PT Charge Capture  Rehab Caseload Tracker   [facility] AM-PAC 6 Clicks Basic Mobility Inpatient Short Form  -      Patient: Susan [name] (73 y.o. female)  Room: 5212/5212    Primary Diagnosis: Foot abscess [L02.619]  Right leg pain [M79.604]  Abscess of right foot [L02.611]   Procedure(s) (LRB):  DEBRIDEMENT RIGHT FOOT WITH GRAFT APPLICATION; APPLICATION OF WOUND VAC;  DRAINAGE OF ABSCESS LEFT FOOT (Bilateral) 3 Days Post-Op  Date of Admission: 04/14/2024   Length of [name]:  13 day(s)  Insurance: Payor: BCBS MEDICARE / Plan: ANTHEM FULL DUAL ADVANTAGE 2 / Product Type: *No Product type* /      Date: 04/28/2024  Time In: 1440       Time Out: 1504   Total Minutes: 24      Isolation:  No active isolations       MDRO: No active infections  Current diet order: ADULT DIET; Regular  ADULT ORAL NUTRITION SUPPLEMENT; Lunch, Dinner; Wound Healing Oral Supplement  ADULT ORAL NUTRITION SUPPLEMENT; Breakfast, Lunch; Standard High Calorie/High Protein Oral Supplement    Precautions: falls   Ordered weight bearing status: Limit wt. bearing to bilateral LE in surgical shoe with assistance of cane        ASSESSMENT:      Based on the objective data described below, the patient presents with  - generalized muscle weakness affecting function  - active participation in therapeutic exercises  - complaint of B LE pain  - Pt require cga in bed mobility  - demo good sitting balance  - demo poor sitting tolerance  - Pt unable to stand  - Pt demo generalized weakness affecting functional mobility status  Patient's rehabilitation potential for below stated goals: good.    Recommendations:  Recommend continued physical therapy during acute [name]. Physical Therapy. Recommend out of bed activity to counteract ill effects of bedrest, with assistance from staff as needed.  Discharge Recommendations: Skilled nursing facility (SNF): patient will benefit from further therapy at rehab  facility to increase strength and endurance to return to prior level of function.  Further Equipment Recommendations for Discharge: DME to be determined at time of discharge from SNF.     Functional Outcome Measure:    AM-PAC: AM-PAC Inpatient Mobility Raw Score : 10  -  SNF:  Current research shows that an AM-PAC score of 17 or less is not associated with a discharge to the patient's home setting.  Based on an AM-PAC score and their current ADL deficits; it is recommended that the patient have 3-5 sessions per week of Physical Therapy at d/c to increase the patient's independence.         PLAN:      Patient will benefit from skilled Physical Therapy intervention to address the above impairments to return to prior level of function. Patient will be seen 3-5 times/week for 1 week.    Patient will achieve PT goals in 1 week.     PHYSICAL THERAPY GOALS:     Previous PT goals:   ongoing, continue towards this goal    - Patient will be mod A with rolling in preparation for EOB activities. - met  - Patient will be mod A with supine<>sit to prepare for OOB activity.- met  - Patient will be max A with sit<>stand in preparation for OOB activities and ambulation.-not met  - Patient will be max A with bed<>chair/WC/BSC in preparation for OOB  activities and ambulation.- not met  - Patient will be max A for ambulating 5+ feet with the least restrictive device to promote functional independence at home. - not met  - Patient will demonstrate fair+ sitting balance to safely enable upright activities and reduce risk for falls. - not met    PLANNED INTERVENTIONS:      Skilled Physical Therapy services will provide functional mobility training, therapeutic exercises, therapeutic activities, patient/caregiver education as indicated.  Skilled Physical Therapy services will modify and progress therapeutic interventions, address functional mobility deficits, address ROM and strength deficits, analyze and cue movement patterns and assess  and modify postural abnormalities to reach the stated goals.    COMMUNICATION/EDUCATION:     Education: Ill effects of bedrest, benefits of activity, HEP, role of PT, PT plan of care.    Education provided to: patient  Opportunity for questions and clarification was provided.    Readiness to learn indicated by: verbalized understanding    Barriers to learning/limitations: none    Comprehension: Patient communicated comprehension      SUBJECTIVE:      Patient agreed to PT c/o pain on B LE  Patient reports 7/10 pain before treatment and 7/10 pain at conclusion of treatment.  Pain Location: B feet    OBJECTIVE DATA SUMMARY:      Orders, labs, and chart reviewed on Susan [name].     Present illness history:   Patient Active Problem List    Diagnosis Date Noted    Foot abscess 04/15/2024    OSA (obstructive sleep apnea)     Dyslipidemia     Asthma     Psychiatric disorder     Chronic obstructive pulmonary disease (HCC)     Bipolar 1 disorder (HCC)     Peripheral neuropathy 06/19/2013    Hypertension 06/19/2013      Previous medical history:   Past Medical History:   Diagnosis Date    Hyperlipidemia     Hypertension         PATIENT FOUND:     Semi reclined in bed. (+) bed alarm.    COGNITIVE STATUS:     Mental Status:  Oriented x3   Communication:  normal   Follows commands:  follows two step commands/direction   General cognition:  intact   Safety/Judgement:  appropriate awareness of environment and need for assistance       EXTREMITIES ASSESSMENT:      Range Of Motion:    BLE: limited d/t pain    Strength:      BLE: Strength is grossly graded as 2+ /5      Tone  normal      THERAPEUTIC ACTIVITIES; FUNCTIONAL MOBILITY AND BALANCE STATUS:     Patient received/participated in Therapeutic Activities (10 minutes)  and Therapeutic Exercises (14 minutes)    Bed mobility:  Rolling: contact guard; with bed flat;   Sup->sit: contact guard; with HOB slightly raised; via long sitting  Sit->supine:  contact guard; ;      Transfers:  Sit - stand: not tested;    Stand - sit: not tested;         Gait Training:  Not tested         Balance:   Static sitting balance:          good       Dynamic sitting balance:     good       Static standing balance:      not  tested      Dynamic standing balance: not tested        THERAPEUTIC EXERCISES:      10 reps each, gentle AA/AROM BLE:  ankle pumps, heel slides, hip ab/duction, straight leg raise, glute sets, quad sets    NEUROMUSCULAR RE-EDUCATION:       Balance activities:   Worked to improve reactive postural responses: stable surface, sitting.   Required manual cues for safety and technique.     ACTIVITY TOLERANCE:     - activity is limited by pain    FINAL LOCATION:     Positioned in bed, all needs within reach. Patient agrees to call for assistance. Head of bed elevated. As found. (+) bed alarm.    Thank you for this referral.  Bostyn Bogie C. Cimberly Stoffel, PT

## 2024-04-29 LAB — CBC WITH AUTO DIFFERENTIAL
Basophils: 0.7 % (ref 0–3)
Eosinophils: 1.5 % (ref 0–5)
Hematocrit: 29.8 % — ABNORMAL LOW (ref 35.0–47.0)
Hemoglobin: 9.3 g/dL — ABNORMAL LOW (ref 11.0–16.0)
Immature Granulocytes %: 1 % (ref 0.0–3.0)
Lymphocytes: 11.5 % — ABNORMAL LOW (ref 28–48)
MCH: 29.2 pg (ref 25.4–34.6)
MCHC: 31.2 g/dL (ref 30.0–36.0)
MCV: 93.7 fL (ref 80.0–98.0)
MPV: 10.7 fL — ABNORMAL HIGH (ref 6.0–10.0)
Monocytes: 8.6 % (ref 1–13)
Neutrophils Segmented: 76.7 % — ABNORMAL HIGH (ref 34–64)
Nucleated RBCs: 0 (ref 0–0)
Platelets: 476 1000/mm3 — ABNORMAL HIGH (ref 140–450)
RBC: 3.18 M/uL — ABNORMAL LOW (ref 3.60–5.20)
RDW: 55.3 — ABNORMAL HIGH (ref 36.4–46.3)
WBC: 9.1 1000/mm3 (ref 4.0–11.0)

## 2024-04-29 LAB — RENAL FUNCTION PANEL
Albumin: 1.8 g/dL — ABNORMAL LOW (ref 3.4–5.0)
Anion Gap: 11 mmol/L (ref 5–15)
BUN: 12 mg/dL (ref 9–23)
CO2: 28 meq/L (ref 20–31)
Calcium: 8.1 mg/dL — ABNORMAL LOW (ref 8.7–10.4)
Chloride: 98 meq/L (ref 98–107)
Creatinine: 0.9 mg/dL (ref 0.55–1.02)
GFR African American: >60
GFR Non-African American: >60
Glucose: 108 mg/dL — ABNORMAL HIGH (ref 74–106)
Phosphorus: 3.3 mg/dL (ref 2.4–5.1)
Potassium: 4.5 meq/L (ref 3.5–5.1)
Sodium: 138 meq/L (ref 136–145)

## 2024-04-29 MED FILL — ACETAMINOPHEN 325 MG PO TABS: 325 mg | ORAL | Qty: 2 | Fill #0

## 2024-04-29 MED FILL — VALSARTAN 40 MG PO TABS: 40 mg | ORAL | Qty: 4 | Fill #0

## 2024-04-29 MED FILL — CARVEDILOL 6.25 MG PO TABS: 6.25 mg | ORAL | Qty: 2 | Fill #0

## 2024-04-29 MED FILL — AMLODIPINE BESYLATE 5 MG PO TABS: 5 mg | ORAL | Qty: 1 | Fill #0

## 2024-04-29 MED FILL — HEPARIN SODIUM (PORCINE) 5000 UNIT/ML IJ SOLN: 5000 [IU]/mL | INTRAMUSCULAR | Qty: 1 | Fill #0

## 2024-04-29 MED FILL — ATORVASTATIN CALCIUM 40 MG PO TABS: 40 mg | ORAL | Qty: 1 | Fill #0

## 2024-04-29 MED FILL — CEFEPIME HCL 1 G IJ SOLR: 1 g | INTRAMUSCULAR | Qty: 1000 | Fill #0

## 2024-04-29 MED FILL — ASPIRIN LOW DOSE 81 MG PO TBEC: 81 mg | ORAL | Qty: 1 | Fill #0

## 2024-04-29 MED FILL — SODIUM-POTASSIUM-PHOSPHORUS 160-280-250 MG PO PACK: 160-280-250 mg | ORAL | Qty: 1 | Fill #0

## 2024-04-29 MED FILL — OXYCODONE-ACETAMINOPHEN 7.5-325 MG PO TABS: 7.5-325 mg | ORAL | Qty: 1 | Fill #0

## 2024-04-29 MED FILL — METRONIDAZOLE 500 MG/100ML IV SOLN: 500 MG/100ML | INTRAVENOUS | Qty: 100 | Fill #0

## 2024-04-29 MED FILL — CLOPIDOGREL BISULFATE 75 MG PO TABS: 75 mg | ORAL | Qty: 1 | Fill #0

## 2024-04-29 NOTE — Plan of Care (Signed)
 Problem: Respiratory - Adult  Goal: Achieves optimal ventilation and oxygenation  04/29/2024 1133 by Quenten Bascom HERO, RN  Outcome: Progressing  04/29/2024 0157 by Dorean Severin D, RN  Outcome: Progressing     Problem: Pain  Goal: Verbalizes/displays adequate comfort level or baseline comfort level  04/29/2024 1133 by Quenten Bascom HERO, RN  Outcome: Progressing  04/29/2024 0157 by Dorean Severin BIRCH, RN  Outcome: Progressing     Problem: Safety - Adult  Goal: Free from fall injury  04/29/2024 1133 by Quenten Bascom HERO, RN  Outcome: Progressing  04/29/2024 0157 by Dorean Severin BIRCH, RN  Outcome: Progressing     Problem: Skin/Tissue Integrity  Goal: Skin integrity remains intact  Description: 1.  Monitor for areas of redness and/or skin breakdown  2.  Assess vascular access sites hourly  3.  Every 4-6 hours minimum:  Change oxygen saturation probe site  4.  Every 4-6 hours:  If on nasal continuous positive airway pressure, respiratory therapy assess nares and determine need for appliance change or resting period  04/29/2024 1133 by Quenten Bascom HERO, RN  Outcome: Progressing  04/29/2024 0157 by Dorean Severin BIRCH, RN  Outcome: Progressing

## 2024-04-29 NOTE — Care Coordination-Inpatient (Signed)
 [facility]                                                                                                 PATIENT INFORMATION   Patient Name: Susan [name], Susan [name]               Hospital Acct: [id number] Patient MRN: [id number]   Address: [address]  [place] [address] Patient CSN: [id number]      Religion: None   Sex: Female Marital Status: Legally Separated   DOB: 01-01-1951 Age:   73 yrs   Home Phone: [phone] 1.00 Mobile Phone:   [phone]     2.00   Race: Black / African American Employer:   Retired   Email: [email or web address] Admitted/Arrived From:      Language: English       ADMISSION INFORMATION   Admit Date: 04/14/2024 Admit Time: 1925   Patient Class: Inpatient Service: Hospitalist   Admit Source: Non-health care facility* Admit Type: Emergency   Admitting Provider: Murrell Norleen Deward VEAR Attending Provider: Onita Pill   Unit: [facility] Room/Bed: 5212/5212    Admission Diagnosis: Foot abscess [L02.619] Foot abscess, Right leg pain, Abscess of right foot and DX codes: L02.619, M79.604, L02.611   Emergency Complaint: ems Foot Pain                Discharge Date:   Discharge Time:     GUARANTOR INFORMATION   Name:  Susan [name], Susan [name] Address: [address]  Rel:  Self   Phone: [phone]   [place], [place] [address] DOB: 11-Apr-1951   EMERGENCY CONTACTS   Name: Shlomo Heller Home:  Mobile: [phone]    Rel: Child   COVERAGE INFORMATION   Primary Insurance:   BCBS MEDICARE Subscriber: Con Karna BROCKS   Plan Name: Anthem Full Dual Advantage 2 Pt Rel to Subscriber: Self [01]   Claim Address: NA Sex: Female      Policy #:  [id number]    Group #: [id number] Group Name:   [place]  [employer] Medicare   Auth #: Auth number: [id number] Ins Phone:         Secondary Insurance:   Subscriber:     Plan Name:   Pt Rel to Subscriber:     Claim Address: NA Sex:       Policy #: N/A    Group #: N/A Group Name: N/A   Auth #: N/A Ins Phone:         Accident Date:    Accident Type:      PROVIDER INFORMATION   PCP:         To, Garnette RAMAN, MD PCP Phone:  [phone]   Referring Prov:   No ref. provider found Referring Phone:  Referring Fax:  N/A      Advanced Directive:  <no information> Research:     Lab Client:   Enrollment Status:

## 2024-04-29 NOTE — Progress Notes (Signed)
 INFECTIOUS DISEASE FOLLOW UP NOTE     Date of admission: 04/14/2024     Date of consult: April 15, 2024        ABX:      Current abx Prior abx    Cefepime  7/14-15  Flagyl  7/15-14 Vanco 7/14-14      ASSESSMENT:       Right foot abscess  -Unclear cause of swelling and pain that has been present for several months  -Has been evaluated by orthopedic  -Patient seen by [facility] ankle specialist Dr. Curtistine Emperor with last visit 03/11/2024 for right contusion of dorsum of foot, lower extremity edema and right foot pronation deformity  -03/08/2024 MRI right hindfoot no fracture.  Mild multifocal osseous edema?  Altered gait.  And mechanical stress-related changes.  Mild intrinsic foot muscular atrophic and intramuscular edema.  Subcu edema along the dorsum of the foot  -xray no fracture  -S/p I&D by ED, cultures unrevealing  - Patient seen by podiatrist Dr. Roberts, OR I and D done 7/17, cultures unrevealing, path consistent with acute on chronic osteomyelitis     Left foot swelling  -Xray no fracture but CT concerning for abscess  - Status post OR I and D 7/25   Leucocytosis POA   AKI/CKD  -Abnormal creatinine few weeks ago on routine blood work at [facility]   PAD  -Patient has been following with vascular surgeon and last seen in office on 04/08/2024  -12/2023 ABI at [facility] with severe left lower extremity arterial insufficiency at rest and right first digital disease with known proximal arterial insufficiency  -12/2023 s/p right lower extremity angiogram showed single-vessel peroneal runoff with good branches   Ch hypoxic resp failure   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  -Has been following with neurosurgery at [facility] artery aneurysm  -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 and following with  [facility] neurosurgery with plan for continued management   Ax- PCN -as a child   Co-morb- COPD, schizoaffective disorder, HTN, depression,             RECOMMENDATIONS:      Complicated 73 year old female with significant comorbidities coming with bilateral feet swelling, right worse than left    Tmax 99.1, other VSS, 97% room air sat  Labs include creatinine 0.9 BS 108 WBC 9.1 Hgb 9.3 PLT 476  Micro 7/25 fungal and AFB left foot smear negative, anaerobic NGTD, routine NG TD; 7/17 wound right foot Gram stain negative, culture NG, ANA NG, right heel bone Gram stain negative, culture negative, ANA NG, 7/15 urine less than 10k, superficial wound culture negative, 7/14 blood culture x 2 negative  Imaging 7/21 IR angio  Path 7/17 right heel acute and chronic osteomyelitis       Assessment     Right foot abscess  - Coming with swelling, pain of right foot   - I&D in ED by provider and cultures negative  - CT concerning for abscess on right foot  - 7/17 s/p OR with I&D and placement of wound VAC -or cultures negative   - Path suggestive of acute and chronic osteomyelitis   - 7/19 CT still showing multiple abscesses in the midfoot and subcu area  - SP 7/25 debridement right foot with graft application application of wound VAC by Dr. Rosamond    Left foot swelling  - Tenderness on dorsal aspect  - 7/17 CT suggestive of cellulitis with  focal area of collection?  Abscess and CTA 7/19 poorly defined collections foot and subcu left knee  - Covering with broad-spectrum antibiotic  - OR drainage of abscess left foot on 7/25 with right foot surgery  - Routine and anaerobic culture no growth to date, AFB and fungal added for completeness     PAD  -Previous history of angio but recent ABI with monophasic waveforms in femoral and plan for angio per vascular  -Vascular study 7/16 moderate right iliac-femoral, severe left arterial insufficiency at femoropopliteal region  -s/p angio 7/21 RLE with right SFA intervention     Bilateral leg  edema  - Recent PVL 7/8 was negative for DVT but low threshold to repeat     Leukocytosis likely reactive and monitor     AKI?  CKD  - Avoid nephrotoxic medications and dose for current creatinine clearance  - Renal ultrasound without any stone or obstruction    Other details as above       Rec:  - Continue cefepime  and Flagyl  for now  - With all cultures this admission to date negative for MRSA and Enterococcus monitor off vancomycin  pending final culture-Unclear duration on antibiotics at this point but right heel path showed osteomyelitis so may need extended course,?  6-week cefepime  plus 4-week metronidazole , so far no positive cultures to guide antibiotic selection, patient with penicillin allergy but tolerating current therapy  - Monitor pending cultures from left foot suspected abscess, added AFB and fungal cultures  -Local wound care per others  - Podiatry follow up  - Continue to monitor left knee with question left knee fluid collection on CTA  - Keep legs elevated on pillows  - Monitor serial labs  - Monitor for any ADE    MICROBIOLOGY:      7/14 Blcx x2 NG              Wd cx NG  7/15 Ucx less than 10K UG F  7/17 anaerobic x 2 NG wound NOS, NG surgical wound NG right heel  7/25 anaerobic no growth to date  Routine Gram stain rare WBC NOS, NGTD  AFB IP  Fungal IP     LINES AND CATHETERS:      PIV     SUMMARY:      Susan [name] is a 73 year old  female with past medical history of COPD, schizoaffective disorder, hypertension, depression, PAD, peripheral neuropathy was admitted on 7/14 with worsening leg pain.     Patient is not a good historian and says lives with her daughter.  She has been almost bedbound for quite some time. Pt has been following with [facility] for basilar artery aneurysm and conservative Mx.  Patient also developed right leg swelling for which she was referred to Ortho and eventually podiatry.  It was not thought to be the part of her back leading to right leg swelling and neuropathy.  Patient  also following with vascular and had procedure done in March with good runoff and did not think was concern for pain and swelling.  Patient last seen by podiatry 03/11/2024 and plan for conservative management.      She was seen in ED on 8 July with leg swelling.  Workup showed negative PVL for DVT and chest x-ray without any significant finding.  She was noted with pitting edema and was given some Lasix  and asked to follow-up with PCP for adjustment in medications.  Patient returned 7/14 with worsening swelling of the legs, right worse than  left.  Workup showed stable vitals, afebrile state.  WBC elevated at 19, Hb 9.8, PLT 354, BUN 34, CR 1.43, AST 17, ALT 14.  Blood cultures were obtained.  Patient had bilateral feet x-ray which showed no fracture or concern for osteo.  Chest x-ray was suggestive of left basilar opacity with developing pneumonia versus atelectasis.  Patient had I&D in the ED.  Podiatry was consulted and CT was ordered.  Wound culture in progress.  Patient on broad-spectrum antibiotic with cefepime  and Vanco.  ID was asked for further evaluation management.       SUBJECTIVE :     Interval notes reviewed.  Patient says pain comes and goes, overall improved, denied fevers chills nausea vomiting diarrhea, discussed with her awaiting cultures monitoring on current antibiotics metronidazole  plus cefepime  off vancomycin   OBJECTIVE     BP (!) 164/77   Pulse 80   Temp 97.3 F (36.3 C) (Oral)   Resp 20   Ht 1.702 m (5' 7)   Wt 81.6 kg (179 lb 14.3 oz)   SpO2 100%   BMI 28.18 kg/m     Temp (24hrs), Avg:97.6 F (36.4 C), Min:97.3 F (36.3 C), Max:99.1 F (37.3 C)    General: Fairly developed and nourished 73 y.o. year-old, female, chronically sick  HEENT: Normocephalic, anicteric sclerae, Pupils equal, round reactive to light, no oropharyngeal lesions. No sinus tenderness.  Neck: Supple, no lymphadenopathy, masses or thyromegaly  Chest: Symmetrical expansion  Lungs: Clear to auscultation  bilaterally, no dullness  Heart: Regular rhythm, no murmur, no rub or gallop, No JVD  Abdomen: Soft, non-tender,non distended, no organomegaly, BS+  Musculoskeletal: Right foot wound VAC in place wd care image today as below, left foot postop dressing clean dry intact not removed  CNS: AAOx3.  Follows simple commands.  SKIN: No obvious skin lesion or rash. Dry, warm, intact        Media Information      Document Information    Wound Care Image: Wound      04/28/2024 10:47   Attached To:   Hospital Encounter on 04/14/24     MEDICATIONS:     Current Facility-Administered Medications   Medication Dose Route Frequency Provider Last Rate Last Admin    valsartan  (DIOVAN ) tablet 160 mg  160 mg Oral BID Josepha Planas, DO   160 mg at 04/29/24 0919    amLODIPine  (NORVASC ) tablet 5 mg  5 mg Oral Daily Josepha Planas, DO   5 mg at 04/29/24 9080    carvedilol  (COREG ) tablet 12.5 mg  12.5 mg Oral BID WC Josepha Planas, DO   12.5 mg at 04/29/24 0919    potassium & sodium phosphates  (PHOS-NAK) 280-160-250 MG packet 250 mg  1 packet Oral 4x Daily Josepha Planas, DO   250 mg at 04/29/24 0919    lidocaine  PF 1 % injection 1 mL  1 mL IntraDERmal Once Vyas, Shruti, DPM        dextrose  50 % IV solution  10-50 mL IntraVENous PRN Josepha Planas, DO   50 mL at 04/25/24 1000    glucagon  injection 1 mg  1 mg IntraMUSCular PRN Josepha Planas, DO        cefepime  (MAXIPIME ) 1,000 mg in sodium chloride  0.9 % 50 mL IVPB (mini-bag)  1,000 mg IntraVENous Q8H Matriano, Norleen Deward DEL, MD   Stopped at 04/29/24 0920    atorvastatin  (LIPITOR ) tablet 40 mg  40 mg Oral Nightly Maree Ladona HERO, MD   40 mg at  04/28/24 2059    aspirin  EC tablet 81 mg  81 mg Oral Daily Maree Ladona HERO, MD   81 mg at 04/29/24 9080    clopidogrel  (PLAVIX ) tablet 75 mg  75 mg Oral Daily Maree Ladona HERO, MD   75 mg at 04/29/24 0919    ipratropium 0.5 mg-albuterol  2.5 mg (DUONEB ) nebulizer solution 1 Dose  1 Dose Inhalation Q4H PRN Sahibzada, Ayaz, MD        senna (SENOKOT) tablet 8.6 mg  1 tablet Oral  Daily PRN Murrell Norleen Deward VEAR, MD        docusate sodium  (COLACE) capsule 100 mg  100 mg Oral BID PRN Murrell Norleen Deward VEAR, MD        ondansetron  (ZOFRAN ) injection 4 mg  4 mg IntraVENous Q6H PRN Murrell Norleen Deward VEAR, MD   4 mg at 04/21/24 1544    acetaminophen  (TYLENOL ) tablet 650 mg  650 mg Oral Q6H PRN Matriano, John Paul H, MD   650 mg at 04/28/24 2100    melatonin tablet 3 mg  3 mg Oral Nightly PRN Murrell Norleen Deward VEAR, MD        naloxone  (NARCAN ) injection 0.4 mg  0.4 mg IntraVENous PRN Murrell Norleen Deward VEAR, MD        heparin  (porcine) injection 5,000 Units  5,000 Units SubCUTAneous BID Sahibzada, Ayaz, MD   5,000 Units at 04/29/24 9080    benzonatate  (TESSALON ) capsule 100 mg  100 mg Oral TID PRN Murrell Norleen Deward VEAR, MD        morphine  (PF) injection 1 mg  1 mg IntraVENous Q3H PRN Murrell Norleen Deward VEAR, MD   1 mg at 04/28/24 1025    sodium hypochlorite (DAKINS) 0.125 % external solution   Irrigation Daily Anastasio Adine LABOR, DPM   Given at 04/29/24 0920    metroNIDAZOLE  (FLAGYL ) 500 mg in 0.9% NaCl 100 mL IVPB premix  500 mg IntraVENous Q8H Meade Viviane RAMAN, MD   Stopped at 04/29/24 0235    oxyCODONE -acetaminophen  (PERCOCET ) 7.5-325 MG per tablet 1 tablet  1 tablet Oral Q4H PRN Josepha Planas, DO   1 tablet at 04/28/24 1539         Labs: Results:   Chemistry Recent Labs     04/27/24  0509 04/28/24  0658   NA 137 136   K 4.4 4.5   CL 103 99   CO2 29 29   BUN 16 15      CBC w/Diff Recent Labs     04/27/24  0509 04/28/24  0658   WBC 9.0 8.5   RBC 2.57* 2.84*   HGB 7.5* 8.3*   HCT 23.8* 26.9*   PLT 513* 560*          RADIOLOGY :        Imaging  CTA ABDOMINAL AORTA W BILAT RUNOFF W WO CONTRAST  Addendum Date: 04/24/2024  HISTORY: peripheral artery disease Technique: Noncontrast CT imaging is acquired through the chest for bolus tracking purposes. Enhanced CT imaging through the abdomen, pelvis, and lower extremities is performed during arterial phase of IV contrast menstruation as part of a CTA runoff.  Angiographic reconstructions are performed formed at a separate workstation. The patient received an uneventful IV bolus of 100 mL of Omnipaque 350 nonionic contrast as part of the study. 3D angiographic post processing and image rendering was performed by the CT technologist at a separate workstation including coronal and sagittal maximal intensity projection (MIP) reconstructions. COMPARISON STUDY:  none FINDINGS: Vascular structures and lower extremities: Abdominal aorta is normal in caliber and enhancement. Celiac, SMA, and renal arteries are moderately calcified yet patent with no high-grade stenosis. IMA is patent. Mild narrowing of the left common iliac artery. The right common iliac and external iliac arteries are patent. Hypogastric arteries are patent. On the right, common femoral and profunda are patent. Diffuse mild disease of the right SFA with moderate grade narrowing in the mid SFA just proximal to the patent stent. The right popliteal artery is patent. Single-vessel runoff to the right foot through the peroneal artery. There is gradual diminished enhancement in the anterior and posterior tibial arteries. The ankle. Ulcerations are present in the right foot with subcutaneous gas and a vacuum. 2 cm abscess in the plantar subcutaneous tissues with an air-fluid level noted. Additional 2.4 cm abscess in the dorsal subcutaneous tissues of the mid foot noted with an air-fluid level. On the left, common femoral artery is mildly narrowed secondary to plaque and the profunda is patent. Diffuse moderate disease of the left SFA with partially calcified plaque. Popliteal artery is patent. Moderate grade stenosis at the proximal posterior tibial artery. Two-vessel runoff via the posterior tibial and peroneal arteries. Diminished enhancement noted in the distal anterior tibial artery. Along the lateral aspect of the midfoot there is a 2.5 x 4.6 cm area of low density which may represent a developing abscess or  phlegmon. There is also a region of edema in the anterior left lower leg at the level of the ankle. At the level of the knee, there is a irregular shaped fluid collection in the anterolateral knee just lateral to the tibial tuberosity which measures approximately 4.2 cm in diameter. Bibasilar subsegmental atelectasis. Heart size is borderline enlarged. The liver has a slight nodular contour. The gallbladder is present and contains excreted contrast. The spleen, pancreas, and adrenal glands are within normal notes. 2.6 cm left renal cyst. Kidneys are otherwise normal in morphology with no hydronephrosis. Appendix and small bowel loops are unremarkable. Diverticulosis is noted throughout the colon. No free intraperitoneal fluid or air. Uterus contains a coarsely calcified leiomyoma. No adnexal or pelvic mass. Urinary bladder is unremarkable. Osseous structures the pelvis and lumbar spine are intact. No compression deformities. IMPRESSION: 1. No aortic inflow stenosis. 2. Moderate grade stenosis of the mid right SFA and patent SFA stent. Patent right popliteal artery with single vessel runoff to the right foot. 3. Diffuse moderate disease of the left SFA with patent popliteal artery and diseased two-vessel runoff to the left foot. Moderate grade stenosis in the proximal left posterior tibial artery. 4. Open wound in the right foot with subcutaneous gas and multiple abscesses in the mid foot along the plantar surface and dorsal subcutaneous tissues. 5. Poorly defined fluid collections in the left foot and in the subcutaneous tissues of the anterior left knee which are concerning for additional abscesses. Electronically signed by: Freda Lanes, MD 04/24/2024 5:46 PM EDT          Workstation ID: [id number]     Result Date: 04/24/2024  HISTORY: peripheral artery disease Technique: Noncontrast CT imaging is acquired through the chest for bolus tracking purposes. Enhanced CT imaging through the abdomen, pelvis, and lower  extremities is performed during arterial phase of IV contrast menstruation as part of a CTA runoff. Angiographic reconstructions are performed formed at a separate workstation. The patient received an uneventful IV bolus of 100 mL of Omnipaque 350 nonionic contrast as part of the study. COMPARISON  STUDY: none FINDINGS: Vascular structures and lower extremities: Abdominal aorta is normal in caliber and enhancement. Celiac, SMA, and renal arteries are moderately calcified yet patent with no high-grade stenosis. IMA is patent. Mild narrowing of the left common iliac artery. The right common iliac and external iliac arteries are patent. Hypogastric arteries are patent. On the right, common femoral and profunda are patent. Diffuse mild disease of the right SFA with moderate grade narrowing in the mid SFA just proximal to the patent stent. The right popliteal artery is patent. Single-vessel runoff to the right foot through the peroneal artery. There is gradual diminished enhancement in the anterior and posterior tibial arteries. The ankle. Ulcerations are present in the right foot with subcutaneous gas and a vacuum. 2 cm abscess in the plantar subcutaneous tissues with an air-fluid level noted. Additional 2.4 cm abscess in the dorsal subcutaneous tissues of the mid foot noted with an air-fluid level. On the left, common femoral artery is mildly narrowed secondary to plaque and the profunda is patent. Diffuse moderate disease of the left SFA with partially calcified plaque. Popliteal artery is patent. Moderate grade stenosis at the proximal posterior tibial artery. Two-vessel runoff via the posterior tibial and peroneal arteries. Diminished enhancement noted in the distal anterior tibial artery. Along the lateral aspect of the midfoot there is a 2.5 x 4.6 cm area of low density which may represent a developing abscess or phlegmon. There is also a region of edema in the anterior left lower leg at the level of the ankle. At  the level of the knee, there is a irregular shaped fluid collection in the anterolateral knee just lateral to the tibial tuberosity which measures approximately 4.2 cm in diameter. Bibasilar subsegmental atelectasis. Heart size is borderline enlarged. The liver has a slight nodular contour. The gallbladder is present and contains excreted contrast. The spleen, pancreas, and adrenal glands are within normal notes. 2.6 cm left renal cyst. Kidneys are otherwise normal in morphology with no hydronephrosis. Appendix and small bowel loops are unremarkable. Diverticulosis is noted throughout the colon. No free intraperitoneal fluid or air. Uterus contains a coarsely calcified leiomyoma. No adnexal or pelvic mass. Urinary bladder is unremarkable. Osseous structures the pelvis and lumbar spine are intact. No compression deformities.     IMPRESSION: 1. No aortic inflow stenosis. 2. Moderate grade stenosis of the mid right SFA and patent SFA stent. Patent right popliteal artery with single vessel runoff to the right foot. 3. Diffuse moderate disease of the left SFA with patent popliteal artery and diseased two-vessel runoff to the left foot. Moderate grade stenosis in the proximal left posterior tibial artery. 4. Open wound in the right foot with subcutaneous gas and multiple abscesses in the mid foot along the plantar surface and dorsal subcutaneous tissues. 5. Poorly defined fluid collections in the left foot and in the subcutaneous tissues of the anterior left knee which are concerning for additional abscesses. Electronically signed by: Daryle Darden, MD 04/19/2024 2:45 PM EDT          Workstation ID: [id number]     IR ANGIOGRAM EXTREMITY RIGHT  Result Date: 04/21/2024  Radiology exam is complete. No Radiologist dictation. Please follow up with ordering provider.     CT FOOT LEFT W CONTRAST  Result Date: 04/17/2024  HISTORY: Swelling and pain. TECHNIQUE: Helical acquisition CT images through the left foot was performed with  contrast with multiplanar reformats. COMPARISON: None. FINDINGS: No visualized acute fracture or dislocation. No evidence of periosteal reaction or  bony destruction to suggest osteomyelitis. There is extensive subcutaneous fat edema and stranding within the lateral soft tissues from the level of the mid foot to the forefoot. In particular, there is a prominent area of focal fluid measuring approximately 3.8 x 2.1 cm on image 20, series 5 that extends superficially to abut the skin and may represent a developing abscess. There is no definite visualized enhancing wall.     IMPRESSION: 1. Findings are compatible with cellulitis of the lateral midfoot and forefoot with more focal 3.8 cm area of fluid at the level of the midfoot that is suspicious for an early developing abscess as there is no discrete capsule. 2. No evidence of osteomyelitis. Electronically signed by: Lillia Hug, MD 04/17/2024 11:37 AM EDT          Workstation ID: [id number]     LE ARTERIAL  Result Date: 04/16/2024                                                                                                                                    Version: 1                                                                                                                                   Study ID: [id number]                                                       [facility]                                                       [address] [place], [place]  [address]  Arterial Lower Extremity Report Name: Susan [name], Susan [name] Date: 04/15/2024, 2: 40 PM MRN: [id number]                                                               Patient Location: [facility] Age: 63 Years                                                               DOB: April 19, 1951 (MM/DD/YYYY)                                                                                Gender: Female Performed By: Edilia Romelia Elden Sherrell, RVT                          Account #:: [id number] Ordering Physician: ANASTASIO CAIN A Reason For Study: Peripheral vascular disease.                                                     INTERPRETATION SUMMARY 1. Moderate right lower extremity arterial insufficiency at the level of the iliac-femoral region. 2. Severe left lower extremity arterial insufficiency at the level of the femoral-popliteal region. 3. Digital pressures deferred due to patient's pain. QUALITY/PROCEDURE Arterial Physio ABI [phone]). A bilateral lower extremity arterial exam was performed. Continuous wave Doppler tracings were recorded from the common femoral, popliteal, posterior tibial and dorsalis pedis arteries bilaterally, and resting ankle brachial pressures were measured. Digital pressures deferred due to patient's pain. RIGHT LEG SEGMENTAL Brachial = 131 mmHg. Ankle Pressure (PT) = 81 mmHg. Ankle Pressure (DP) = 83 mmHg. Right ABI = 0.63. The lower extremity ankle brachial index is 0.63 on the right. The right lower extremity waveforms are monophasic in the femoral, the popliteal, the posterior tibial and the dorsalis pedis arteries. RIGHT DIGIT PPG Digit 1 flow is absent. LEFT LEG SEGMENTAL Brachial = 126 mmHg. Ankle (PT) = 59 mmHg. Ankle (DP) = 63 mmHg. Left ABI = 0.48. The lower extremity ankle brachial index is 0.48 on the left. The left lower extremity waveforms are multiphasic in the femoral, and monophasic in the popliteal, the posterior tibial and the dorsalis pedis arteries. LEFT DIGIT PPG Digit 1 flow is absent. ______________________________________________________________________________ Electronically signed by: Dr Blenda Fear, M.D  04/16/2024, 10: 01 PM     US  RETROPERITONEAL COMPLETE  Result Date: 04/15/2024  INDICATION: Acute renal failure. COMPARISON: None.  TECHNIQUE: Transabdominal grayscale and duplex sonography of the retroperitoneum performed. FINDINGS: RIGHT KIDNEY:  10.7 cm in length. (Normal range 9-13 cm) Normal renal parenchymal echogenicity. No obstruction, renal calculi or focal lesion. LEFT KIDNEY: 9.8 cm in length. (Normal range 9-13 cm) Normal renal parenchymal echogenicity.  And interpolar simple-appearing partially exophytic left renal cyst measures 2.6 x 2.6 x 2.1 cm. URINARY BLADDER: Bladder has a pre-void volume of 240.0 mL. (<50 mL WNL) Patient did not void for the exam. Neither ureteral jet(s) identified. No urinary bladder wall thickening or debris within the urinary bladder. (Normal wall thickness <3 mm distended, <5 mm underdistended.) Bladder contains a Foley catheter and is decompressed. PANCREAS:  Visualized pancreas is within normal limits. IVC: Patent. ABDOMINAL AORTA: Normal caliber abdominal aorta. COMMON ILIAC ARTERIES: Suboptimally visualized secondary to interposed bowel gas.     IMPRESSION: 1.  No hydronephrosis. Kidneys unremarkable sonographically with the exception of a partially exophytic simple-appearing interpolar left renal cyst. 2.  Urinary bladder unremarkable sonographically. 3.  Common iliac arteries above the visualized sonographically due to interposed bowel gas Electronically signed by: Alm Conte, MD 04/15/2024 8:34 PM EDT          Workstation ID: [id number]     CT FOOT RIGHT W CONTRAST  Result Date: 04/15/2024  Indication: Right foot infection. Evaluate for abscess. Comparison: None available Technique: Computed tomographic imaging of the right foot was performed following IV contrast administration. Coronal and sagittal reconstructions were created from the initial axial data set and submitted for interpretation. Findings: Complex gas and fluid containing abscess//phlegmon in the subcutaneous lateral hindfoot spanning approximately 4.4 cm craniocaudal by 2 x 1.5 cm transverse. This is positioned posterior to the  peroneal tendons which are intact and not overtly involved. There is subtle loss of bony cortex/rarefaction at the lateral margin of the subjacent calcaneus concerning for osteomyelitis.     IMPRESSION: 1. Subcutaneous phlegmon/abscess lateral right hindfoot and 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 of the subjacent lateral margin of the calcaneus. Electronically signed by: Glade Reeve, MD 04/15/2024 2:16 PM EDT          Workstation ID: [id number]     XR FOOT RIGHT (2 VIEWS)  Result Date: 04/15/2024  Exam: 2 views of the right foot. Clinical indications:  PAIN. RULE OUT OSTEOMYELITIS. Right foot pain and swelling. Comparison:   04/15/2024;  Findings:  No fracture.  No dislocation. No radiopaque foreign bodies. No bone erosions.     Impression:  No fracture. No bone erosions to suggest osteomyelitis. Electronically signed by: Celine Rear, MD 04/15/2024 12:52 AM EDT          Workstation ID: [id number]     XR FOOT LEFT (2 VIEWS)  Result Date: 04/15/2024  Exam: 2 views of the left foot. Clinical indications:  PAIN right foot. Swelling. Possible osteomyelitis. Comparison:  None Findings:  No fracture.  No dislocation. No bone erosions. No radiopaque foreign bodies.     Impression:  No fracture. Electronically signed by: Celine Rear, MD 04/15/2024 12:51 AM EDT          Workstation ID: [id number]     XR CHEST PORTABLE  Result Date: 04/15/2024  Exam: AP portable chest Clinical indication: SEPSIS Comparison: 04/08/2024;  Results:  Left basilar opacity. No pneumothorax. Heart normal.  Mediastinal contours are normal. No free air is seen under the hemidiaphragms.   Osseous structures intact.  IMPRESSION: Left basilar opacity. Developing pneumonia versus atelectasis. Electronically signed by: Celine Rear, MD 04/15/2024 12:41 AM EDT          Workstation ID: [id number]     VL DUP LOWER EXTREMITY VENOUS BILATERAL  Result Date: 04/09/2024                                                                                                                                     Version: 1                                                                                                                                   Study ID: [id number]                                                       [facility]                                                       [address] [place], [place]  [address]                                                      Lower Extremity Venous Duplex Report Name: AKSHITHA, CULMER Date: 04/08/2024, 2:  53 PM MRN: [id number]                                                               Patient Location: ER^ERWT^WT^[facility] Age: 81 Years                                                               DOB: 12-15-1950 (MM/DD/YYYY)                                                                               Gender: Female Performed By: Kylee R. Holloman, RVT                                       Account #:: [id number] Ordering Physician: [facility], [facility] Reason For Study: Lower extremity pain and swelling                                                     INTERPRETATION SUMMARY 1. No evidence of deep vein thrombosis in the bilateral lower extremities. QUALITY/PROCEDURE Complete bilateral venous duplex performed. Quality of the study is good. Location: ER. RIGHT LEG The deep venous system of the right lower extremity was examined using duplex ultrasound. B-mode imaging demonstrated normal compressibility of the right common femoral, femoral, popliteal, posterior tibial and peroneal veins. Doppler flow signals were spontaneous and phasic at all levels. RIGHT SAPHENOUS VEINS Right great saphenous vein at the saphenofemoral junction demonstrates normal compressibility with spontaneous and phasic venous hemodynamics. LEFT LEG The deep venous system of the left lower extremity was examined using duplex  ultrasound. B-mode imaging demonstrated normal compressibility of the left common femoral, femoral, popliteal, posterior tibial and peroneal veins. Doppler flow signals were spontaneous and phasic at all levels. LEFT LEG SAPHENOUS VEINS Left great saphenous vein at the saphenofemoral junction demonstrates normal compressibility with spontaneous and phasic venous hemodynamics. ______________________________________________________________________________ Electronically signed by: Dr Blenda Fear, M.D                       04/09/2024, 8: 57 AM     XR CHEST (2 VW)  Result Date: 04/08/2024  History: Shortness of breath Comparison: None Technique: PA and lateral radiographs of the chest were obtained. Two views, 2 exposures total. Findings: The lungs and pleural spaces are clear. Normal cardiomediastinal silhouette. Degenerative changes of the thoracic spine.     IMPRESSION: No acute cardiopulmonary disease. Electronically signed by: Norleen Gleason 04/08/2024 11:20 AM EDT          Workstation ID: [id number]  I have independently reviewed lab studies and imgaing as well as review of nursing notes and physican notes from the past 24 hours.    Dragon medical dictation software was used for portions of this report. Unintended errors may occur.     Susan Julieanne Meadows, MD  April 29, 2024    [facility]   Office [phone]  Fax:[phone]

## 2024-04-29 NOTE — Progress Notes (Signed)
 PHYSICAL THERAPY TREATMENT   Time  PT Charge Capture  Rehab Caseload Tracker   Cross Keys AM-PAC 6 Clicks Basic Mobility Inpatient Short Form  -      Patient: Susan [name] (73 y.o. female)  Room: 5212/5212    Primary Diagnosis: Foot abscess [L02.619]  Right leg pain [M79.604]  Abscess of right foot [L02.611]   Procedure(s) (LRB):  DEBRIDEMENT RIGHT FOOT WITH GRAFT APPLICATION; APPLICATION OF WOUND VAC;  DRAINAGE OF ABSCESS LEFT FOOT (Bilateral) 4 Days Post-Op  Date of Admission: 04/14/2024   Length of Stay:  14 day(s)  Insurance: Payor: BCBS MEDICARE / Plan: ANTHEM FULL DUAL ADVANTAGE 2 / Product Type: *No Product type* /      Date: 04/29/2024  Time In: 1520       Time Out: 1543   Total Minutes: 23      Isolation:  No active isolations       MDRO: No active infections  Current diet order: ADULT DIET; Regular  ADULT ORAL NUTRITION SUPPLEMENT; Lunch, Dinner; Wound Healing Oral Supplement  ADULT ORAL NUTRITION SUPPLEMENT; Breakfast, Lunch; Standard High Calorie/High Protein Oral Supplement  Diet NPO Exceptions are: Sips of Water  with Meds    Precautions: falls   Ordered weight bearing status: Limit wt. bearing to bilateral LE in surgical shoe with assistance of DME       ASSESSMENT:      Based on the objective data described below, the patient presents with  - generalized muscle weakness affecting function  - active participation in therapeutic exercises  - complaint of B LE pain  - Pt require cga in bed mobility  - demo good sitting balance  - demo fair sitting tolerance  - Pt unable to stand despite max assist from therapist  - Pt demo generalized weakness affecting functional mobility status  Patient's rehabilitation potential for below stated goals: good.    Recommendations:  Recommend continued physical therapy during acute stay. Physical Therapy. Recommend out of bed activity to counteract ill effects of bedrest, with assistance from staff as needed.  Discharge Recommendations: Skilled nursing  facility (SNF): patient will benefit from further therapy at rehab facility to increase strength and endurance to return to prior level of function.  Further Equipment Recommendations for Discharge: DME to be determined at time of discharge from SNF.           PLAN:      Patient will benefit from skilled Physical Therapy intervention to address the above impairments to return to prior level of function. Patient will be seen 3-5 times/week for 1 week.    Patient will achieve PT goals in 1 week.       PLANNED INTERVENTIONS:      Skilled Physical Therapy services will provide functional mobility training, therapeutic exercises, therapeutic activities, patient/caregiver education as indicated.  Skilled Physical Therapy services will modify and progress therapeutic interventions, address functional mobility deficits, address ROM and strength deficits, analyze and cue movement patterns and assess and modify postural abnormalities to reach the stated goals.    COMMUNICATION/EDUCATION:     Education: Ill effects of bedrest, benefits of activity, HEP, role of PT, PT plan of care.    Education provided to: patient  Opportunity for questions and clarification was provided.    Readiness to learn indicated by: verbalized understanding    Barriers to learning/limitations: none    Comprehension: Patient communicated comprehension      SUBJECTIVE:      Patient agreed to PT c/o  pain on B LE  Patient reports 7/10 pain before treatment and 7/10 pain at conclusion of treatment.  Pain Location: B feet    OBJECTIVE DATA SUMMARY:      Orders, labs, and chart reviewed on Janeshia C Red.     Present illness history:   Patient Active Problem List    Diagnosis Date Noted    Foot abscess 04/15/2024    OSA (obstructive sleep apnea)     Dyslipidemia     Asthma     Psychiatric disorder     Chronic obstructive pulmonary disease (HCC)     Bipolar 1 disorder (HCC)     Peripheral neuropathy 06/19/2013    Hypertension 06/19/2013      Previous medical  history:   Past Medical History:   Diagnosis Date    Hyperlipidemia     Hypertension         PATIENT FOUND:     Semi reclined in bed. (+) bed alarm.    COGNITIVE STATUS:     Mental Status:  Oriented x3   Communication:  normal   Follows commands:  follows two step commands/direction   General cognition:  intact   Safety/Judgement:  appropriate awareness of environment and need for assistance       EXTREMITIES ASSESSMENT:      Range Of Motion:    BLE: limited d/t pain    Strength:      BLE: Strength is grossly graded as 2+ /5      Tone  normal      THERAPEUTIC ACTIVITIES; FUNCTIONAL MOBILITY AND BALANCE STATUS:     Patient received/participated in Therapeutic Activities (12 minutes)  and Therapeutic Exercises (12 minutes)    Bed mobility:  Rolling: contact guard; with bed flat;   Sup->sit: contact guard;   Sit->supine:  contact guard; ;     Transfers:  Sit - stand: unable;    Stand - sit: not tested;         Gait Training:  Not tested         Balance:   Static sitting balance:          good       Dynamic sitting balance:     good       Static standing balance:      not tested      Dynamic standing balance: not tested        THERAPEUTIC EXERCISES:      10 reps each, gentle AA/AROM BLE:  ankle pumps, heel slides, hip ab/duction, straight leg raise, glute sets, quad sets    NEUROMUSCULAR RE-EDUCATION:       Balance activities:   Worked to improve reactive postural responses: stable surface, sitting.   Required manual cues for safety and technique.     ACTIVITY TOLERANCE:     - activity is limited by pain    FINAL LOCATION:     Positioned in bed, all needs within reach. Patient agrees to call for assistance. Head of bed elevated. As found. (+) bed alarm.    Thank you for this referral.  Alvetta Hidrogo C. Ludene Stokke, PT

## 2024-04-29 NOTE — Progress Notes (Signed)
 [facility]  PROGRESS NOTE  [name], PA-C      [name], 73 y.o. female  MRN [id number]  [facility]    Date: 04/29/24  Post-Op Day: 8 s/p RLE angio with sfa and ak pop orbital atherectomy, R SFA and AK pop balloon angioplasty  Assessment:   73 y.o. female with known atherosclerosis of native arteries of bilateral lower extremities with wounds on bilateral feet s/p debridement by podiatry    R foot with bulky dressing and wound vac in place  L foot with large dorsal ulceration  2+ R femoral pulse    Plan:   Tentatively plan for LLE angiogram tomorrow  NPO after midnight  Continue DAPT and statin  Discussed with daughter by phone, she requests daily update from team members    Attending Addendum:  ~4cm L dorsal foot wound after debridement w/ podiatry. Severe arterial insufficiency on ABI with multilevel disease on CTA. I think angiogram is indicated to ensure adequate perfusion to heal this wound. Will plan angiogram tomorrow. She is agreeable.     I have seen and evaluated the patient. I agree with the above note with additions/exceptions as noted.    Lonni Alberts, MD   [facility]     Subjective:   73 y.o. female with pain in bilateral feet.    Objective:   Admit weight: Weight - Scale: 81.6 kg (180 lb)  Last recorded weight: Weight - Scale: 81.6 kg (179 lb 14.3 oz)    Temp (24hrs), Avg:97.4 F (36.3 C), Min:97.3 F (36.3 C), Max:97.7 F (36.5 C)    Body mass index is 28.18 kg/m.    Intake/Output Summary (Last 24 hours) at 04/29/2024 1527  Last data filed at 04/29/2024 0918  Gross per 24 hour   Intake --   Output 1400 ml   Net -1400 ml     BP (!) 140/79   Pulse 72   Temp 97.7 F (36.5 C) (Oral)   Resp 18   Ht 1.702 m (5' 7)   Wt 81.6 kg (179 lb 14.3 oz)   SpO2 97%   BMI 28.18 kg/m     General: Awake, alert, and oriented.   HEENT: Normocephalic and atraumatic   Neck: Trachea midline.  No JVD.   Heart: Regular rate  Regular Rhythm  Lungs:  Symmetrical chest expansion. Normal effort   Abdomen: Soft, non-tender  Extremities: No cyanosis or edema.   RLE: bulky wound vac dressing on R foot   LLE: large dorsal ulcer on L foot  Neuro: Alert and oriented x3  Follows basic commands. UE and LE motor and sensory function grossly intact.                  Labs:    Complete Blood Count with Differential  Lab Results   Component Value Date/Time    WBC 9.1 04/29/2024 09:54 AM    RBC 3.18 (L) 04/29/2024 09:54 AM    HCT 29.8 (L) 04/29/2024 09:54 AM    MCV 93.7 04/29/2024 09:54 AM    MCH 29.2 04/29/2024 09:54 AM    MCHC 31.2 04/29/2024 09:54 AM    RDW 55.3 (H) 04/29/2024 09:54 AM    MPV 10.7 (H) 04/29/2024 09:54 AM    MONOS 8.6 04/29/2024 09:54 AM    BASOS 0.7 04/29/2024 09:54 AM       Basic Metabolic Profile  Lab Results   Component Value Date    NA 138 04/29/2024  CO2 28 04/29/2024    BUN 12 04/29/2024    GLUCOSE 108 (H) 04/29/2024    CALCIUM  8.1 (L) 04/29/2024       Hepatic Function Panel  Lab Results   Component Value Date    ALBUMIN 1.8 (L) 04/29/2024    ALKPHOS 82 04/16/2024        Microbiology  Results       Procedure Component Value Units Date/Time    Culture with Smear, Acid Fast Bacillius [[id number]] Collected: 04/28/24 0845    Order Status: Completed Specimen: Other Updated: 04/29/24 1244     Acid Fast Smear       No Acid Fast Bacilli Seen On Concentrated Smear by Fluorochrome stain           Culture Result       Culture In Progress, Weekly Updates To Follow          Narrative:      Add to 7/25 OR culture, discussed with micro 7/28    Culture, Fungus [[id number]] Collected: 04/28/24 0845    Order Status: Completed Specimen: Foot, Left Updated: 04/28/24 1219     Microscopic Observation --        Rare Epithelial Cells  Rare WBC'S  No Organisms Seen  No Fungal Elements Seen       Culture Result       Culture In Progress, Weekly Updates To Follow          Narrative:      Add to 7/25 OR culture, discussed with micro 7/28    Culture, Anaerobic [[id number]]  Collected: 04/25/24 1539    Order Status: Completed Specimen: Swab from Foot Updated: 04/29/24 0819     Culture Result       No Anaerobes Isolated After 4 Days          Narrative:      LEFT FOOT ABCESS    Culture, Wound W Gram Stain [[id number]] Collected: 04/25/24 1539    Order Status: Completed Specimen: Swab from Foot Updated: 04/29/24 0940     Gram Stain Result Rare WBC'S  No Organisms Seen        Culture Result No Growth at 4 days       Narrative:      LEFT FOOT ABCESS    Culture, Anaerobic [[id number]] Collected: 04/17/24 1252    Order Status: Completed Specimen: Swab from Foot Updated: 04/22/24 0854     Culture Result       No Anaerobes Isolated After 5 Days          Narrative:      Right foot wound    Culture, Anaerobic [[id number]] Collected: 04/17/24 1252    Order Status: Completed Specimen: Bone from Foot Updated: 04/22/24 0855     Culture Result       No Anaerobes Isolated After 5 Days          Narrative:      Right Heel bone    Culture, Wound W Gram Stain [[id number]] Collected: 04/17/24 1252    Order Status: Completed Specimen: Swab from Foot Updated: 04/20/24 1330     Gram Stain Result Rare WBC'S  No Organisms Seen        Culture Result No Growth       Narrative:      Right foot wound    Culture, Surgical Wound with Gram Stain  [[id number]] Collected: 04/17/24 1252    Order Status: Completed Specimen: Bone from Foot Updated: 04/20/24 1342  Gram Stain Result Rare WBC'S  No Organisms Seen        Culture Result No Growth       Narrative:      Right Heel bone                 [name], PA-C  07/29/253:27 PM

## 2024-04-29 NOTE — Progress Notes (Signed)
 Subjective:     Her daughter reports that vascular surgeon Dr. Maree told her: For plans left-sided angiogram after left foot abscess I&D.  No left foot abscess ID was done by the podiatry.     Assessment/Plan:     Patient seen for complicated medical problems as listed below :    Right foot abscess   Left foot abscess  S/p I and D in ER  PAD bilateral lower extremities  Chronic hypoxic resp failure  COPD  Possible pneumonia    Plan       Status post right angiogram on 04/21/24, discussed with vascular surgery.  wound VAC removed, tissue clean, good bleeding.  Greatly appreciate assist.  CT showed bilateral foot abscess.  7/17 s/p OR with I&D and placement of wound VAC on right foot-OR cultures negative so far and awaiting bone path. 7/19 CT still showing multiple abscesses in the right midfoot and subcu area.  Left foot also showed multiple abscess, status post debridement of right ankle as well as I&D of left foot abscess, follow OR culture  Patient hyperkalemia resolved.  CTA lower extremities showing moderate PAD  Continue empiric IV vancomycin /cefepime /Flagyl .  Vanco trough  18.9, dose vancomycin  accordingly..  ID consult appreciated  Continue bronchodilators  O2 supplementation  Dvt ppx with heparin  subcu  Discussed with patient   Continue home regimen for otherwise chronic, stable medical conditions as noted above.   Her daughter reports that vascular surgeon Dr. Maree told her: For plans left-sided angiogram after left foot abscess I&D.  Now left foot abscess ID was done by the podiatry.  I spoke to vascular surgery PA and discussed the above issue.  She will discuss with vascular attending.     Reason for continued hospitalization: Follow OR culture        Objective:         Vitals:    04/29/24 0408 04/29/24 0733 04/29/24 0919 04/29/24 1131   BP: (!) 151/79 (!) 164/77 (!) 164/77 (!) 140/79   Pulse: 77 80 80 72   Resp: 19 20  18    Temp: 97.3 F (36.3 C) 97.3 F (36.3 C)  97.7 F (36.5 C)   TempSrc:  Temporal Oral  Oral   SpO2: 97% 100%  97%   Weight:       Height:       EXAM:  GENERAL: in mild distress.   RESPIRATORY: Bilateral BS present. No rales or rhonchi.   CARDIOVASCULAR: S1 and S2 present, regular. No murmur  ABDOMEN: Soft and nontender with positive bowel sounds.   NEUROLOGICAL: Cranial nerves II through XII grossly intact. No focal weakness  Extremities, bilateral lower extremities wound. Vac on right side .     Recent Labs     04/29/24  0954   WBC 9.1   HGB 9.3*   HCT 29.8*   MCV 93.7   PLT 476*     Recent Labs     04/29/24  0954   NA 138   K 4.5   CL 98   CO2 28   ANIONGAP 11   CALCIUM  8.1*   BUN 12   CREATININE 0.90   GLUCOSE 108*     No results for input(s): LABALBU, TP, AST, ALKPHOS, BILITOT, BILIDIR, ALT, GLOB in the last 72 hours.    Invalid input(s): AGRAT  Lab Results   Component Value Date/Time    COLORU Yellow 04/15/2024 03:02 AM    CLARITYU Clear 04/15/2024 03:02 AM    GLUCOSEU  Negative 04/15/2024 03:02 AM    BILIRUBINUR Negative 04/15/2024 03:02 AM    KETUA Negative 04/15/2024 03:02 AM    BLOODU Trace-lysed 04/15/2024 03:02 AM    PHUR 5.5 04/15/2024 03:02 AM    NITRU Negative 04/15/2024 03:02 AM    LEUKOCYTESUR Trace 04/15/2024 03:02 AM     Recent Labs     04/26/24  1838 04/26/24  2045 04/27/24  0809 04/27/24  1129 04/27/24  1723 04/27/24  2031 04/28/24  0800 04/28/24  1134   POCGLU 97 95 73 82 83 94 81 134*         Discussed with patient and family regarding management, prognosis, treatment and complications of the patient's medical conditions in detail. All questions answered  to the satisfaction of those individual(s) who also verbalized understanding of and agreement with the assessment and plan.     Dr. [place], Garnette RAMAN, MD, thank you for allowing us  to participate in the care of this patient.  Please contact me through the hospital if questions arise.  Dragon medical dictation software was used for portions of this report. Unintended errors may occur.     Sergio callander  MD  [facility]  [phone]

## 2024-04-29 NOTE — Plan of Care (Signed)
 Problem: Respiratory - Adult  Goal: Achieves optimal ventilation and oxygenation  Outcome: Progressing     Problem: Pain  Goal: Verbalizes/displays adequate comfort level or baseline comfort level  Outcome: Progressing     Problem: Safety - Adult  Goal: Free from fall injury  Outcome: Progressing     Problem: Skin/Tissue Integrity  Goal: Skin integrity remains intact  Description: 1.  Monitor for areas of redness and/or skin breakdown  2.  Assess vascular access sites hourly  3.  Every 4-6 hours minimum:  Change oxygen saturation probe site  4.  Every 4-6 hours:  If on nasal continuous positive airway pressure, respiratory therapy assess nares and determine need for appliance change or resting period  Outcome: Progressing

## 2024-04-30 ENCOUNTER — Inpatient Hospital Stay: Admit: 2024-04-30 | Payer: Medicare (Managed Care) | Primary: Family Medicine

## 2024-04-30 LAB — RENAL FUNCTION PANEL
Albumin: 1.7 g/dL — ABNORMAL LOW (ref 3.4–5.0)
Anion Gap: 7 mmol/L (ref 5–15)
BUN: 10 mg/dL (ref 9–23)
CO2: 30 meq/L (ref 20–31)
Calcium: 7.9 mg/dL — ABNORMAL LOW (ref 8.7–10.4)
Chloride: 99 meq/L (ref 98–107)
Creatinine: 0.76 mg/dL (ref 0.55–1.02)
GFR African American: >60
GFR Non-African American: >60
Glucose: 79 mg/dL (ref 74–106)
Phosphorus: 3.9 mg/dL (ref 2.4–5.1)
Potassium: 4.1 meq/L (ref 3.5–5.1)
Sodium: 137 meq/L (ref 136–145)

## 2024-04-30 LAB — CBC WITH AUTO DIFFERENTIAL
Basophils: 0.4 % (ref 0–3)
Eosinophils: 1.2 % (ref 0–5)
Hematocrit: 25.8 % — ABNORMAL LOW (ref 35.0–47.0)
Hemoglobin: 8.2 g/dL — ABNORMAL LOW (ref 11.0–16.0)
Immature Granulocytes %: 0.8 % (ref 0.0–3.0)
Lymphocytes: 15 % — ABNORMAL LOW (ref 28–48)
MCH: 29.1 pg (ref 25.4–34.6)
MCHC: 31.8 g/dL (ref 30.0–36.0)
MCV: 91.5 fL (ref 80.0–98.0)
MPV: 10.1 fL — ABNORMAL HIGH (ref 6.0–10.0)
Monocytes: 8.9 % (ref 1–13)
Neutrophils Segmented: 73.7 % — ABNORMAL HIGH (ref 34–64)
Nucleated RBCs: 0 (ref 0–0)
Platelets: 590 1000/mm3 — ABNORMAL HIGH (ref 140–450)
RBC: 2.82 M/uL — ABNORMAL LOW (ref 3.60–5.20)
RDW: 53 — ABNORMAL HIGH (ref 36.4–46.3)
WBC: 9 1000/mm3 (ref 4.0–11.0)

## 2024-04-30 LAB — CULTURE, ANAEROBIC

## 2024-04-30 LAB — CULTURE, WOUND W GRAM STAIN: Culture Result: NO GROWTH

## 2024-04-30 MED ORDER — HEPARIN SODIUM (PORCINE) 1000 UNIT/ML IJ SOLN
1000 | INTRAMUSCULAR | Status: AC | PRN
Start: 2024-04-30 — End: 2024-04-30
  Administered 2024-04-30: 18:00:00 8000 via INTRAVENOUS

## 2024-04-30 MED ORDER — SODIUM CHLORIDE 0.9 % IV SOLN
0.9 | INTRAVENOUS | Status: AC | PRN
Start: 2024-04-30 — End: 2024-04-30
  Administered 2024-04-30: 18:00:00 via INTRA_ARTERIAL

## 2024-04-30 MED ORDER — IODIXANOL 320 MG/ML IV SOLN
320 | Freq: Once | INTRAVENOUS | Status: AC | PRN
Start: 2024-04-30 — End: 2024-04-30
  Administered 2024-04-30: 19:00:00 45 mL via INTRA_ARTERIAL

## 2024-04-30 MED ORDER — LIDOCAINE HCL 1 % IJ SOLN
1 | Freq: Once | INTRAMUSCULAR | Status: DC
Start: 2024-04-30 — End: 2024-04-30

## 2024-04-30 MED ORDER — MIDAZOLAM HCL 2 MG/2ML IJ SOLN
2 | INTRAMUSCULAR | Status: DC | PRN
Start: 2024-04-30 — End: 2024-04-30

## 2024-04-30 MED ORDER — FENTANYL CITRATE (PF) 100 MCG/2ML IJ SOLN
100 | INTRAMUSCULAR | Status: DC
Start: 2024-04-30 — End: 2024-04-30

## 2024-04-30 MED ORDER — NALOXONE HCL 0.4 MG/ML IJ SOLN
0.4 | INTRAMUSCULAR | Status: DC | PRN
Start: 2024-04-30 — End: 2024-04-30

## 2024-04-30 MED ORDER — FENTANYL CITRATE (PF) 100 MCG/2ML IJ SOLN
100 | INTRAMUSCULAR | Status: AC | PRN
Start: 2024-04-30 — End: 2024-04-30
  Administered 2024-04-30 (×3): 25 via INTRAVENOUS
  Administered 2024-04-30: 18:00:00 50 via INTRAVENOUS

## 2024-04-30 MED ORDER — HEPARIN (PORCINE) IN NACL 1000-0.9 UT/500ML-% IV SOLN
1000-0.9 | INTRAVENOUS | Status: DC
Start: 2024-04-30 — End: 2024-04-30

## 2024-04-30 MED ORDER — MIDAZOLAM HCL 2 MG/2ML IJ SOLN
2 | INTRAMUSCULAR | Status: DC
Start: 2024-04-30 — End: 2024-04-30

## 2024-04-30 MED ORDER — FENTANYL CITRATE (PF) 100 MCG/2ML IJ SOLN
100 | INTRAMUSCULAR | Status: DC | PRN
Start: 2024-04-30 — End: 2024-04-30

## 2024-04-30 MED ORDER — MIDAZOLAM HCL 2 MG/2ML IJ SOLN
2 | INTRAMUSCULAR | Status: AC | PRN
Start: 2024-04-30 — End: 2024-04-30
  Administered 2024-04-30 (×2): .5 via INTRAVENOUS
  Administered 2024-04-30: 18:00:00 1 via INTRAVENOUS

## 2024-04-30 MED ORDER — FLUMAZENIL 0.5 MG/5ML IV SOLN
0.5 | INTRAVENOUS | Status: DC | PRN
Start: 2024-04-30 — End: 2024-04-30

## 2024-04-30 MED ORDER — LIDOCAINE HCL 1 % IJ SOLN
1 | INTRAMUSCULAR | Status: AC | PRN
Start: 2024-04-30 — End: 2024-04-30
  Administered 2024-04-30: 18:00:00 4 via INTRADERMAL

## 2024-04-30 MED FILL — METRONIDAZOLE 500 MG/100ML IV SOLN: 500 MG/100ML | INTRAVENOUS | Qty: 100 | Fill #0

## 2024-04-30 MED FILL — CEFEPIME HCL 1 G IJ SOLR: 1 g | INTRAMUSCULAR | Qty: 1000 | Fill #0

## 2024-04-30 MED FILL — CARVEDILOL 6.25 MG PO TABS: 6.25 mg | ORAL | Qty: 2 | Fill #0

## 2024-04-30 MED FILL — SODIUM-POTASSIUM-PHOSPHORUS 160-280-250 MG PO PACK: 160-280-250 mg | ORAL | Qty: 1 | Fill #0

## 2024-04-30 MED FILL — AMLODIPINE BESYLATE 5 MG PO TABS: 5 mg | ORAL | Qty: 1 | Fill #0

## 2024-04-30 MED FILL — IPRATROPIUM-ALBUTEROL 0.5-2.5 (3) MG/3ML IN SOLN: 0.5-2.5 (3) MG/3ML | RESPIRATORY_TRACT | Qty: 3 | Fill #0

## 2024-04-30 MED FILL — MORPHINE SULFATE 2 MG/ML IJ SOLN: 2 mg/mL | INTRAMUSCULAR | Qty: 1 | Fill #0

## 2024-04-30 MED FILL — HEPARIN SODIUM (PORCINE) 5000 UNIT/ML IJ SOLN: 5000 [IU]/mL | INTRAMUSCULAR | Qty: 1 | Fill #0

## 2024-04-30 MED FILL — HEPARIN (PORCINE) IN NACL 1000-0.9 UT/500ML-% IV SOLN: 1000-0.9 UT/500ML-% | INTRAVENOUS | Qty: 1500 | Fill #0

## 2024-04-30 MED FILL — ASPIRIN LOW DOSE 81 MG PO TBEC: 81 mg | ORAL | Qty: 1 | Fill #0

## 2024-04-30 MED FILL — ATORVASTATIN CALCIUM 40 MG PO TABS: 40 mg | ORAL | Qty: 1 | Fill #0

## 2024-04-30 MED FILL — OXYCODONE-ACETAMINOPHEN 7.5-325 MG PO TABS: 7.5-325 mg | ORAL | Qty: 1 | Fill #0

## 2024-04-30 MED FILL — VISIPAQUE 320 MG/ML IV SOLN: 320 mg/mL | INTRAVENOUS | Qty: 100 | Fill #0

## 2024-04-30 MED FILL — VALSARTAN 40 MG PO TABS: 40 mg | ORAL | Qty: 4 | Fill #0

## 2024-04-30 MED FILL — LIDOCAINE HCL 1 % IJ SOLN: 1 % | INTRAMUSCULAR | Qty: 20 | Fill #0

## 2024-04-30 MED FILL — FENTANYL CITRATE (PF) 100 MCG/2ML IJ SOLN: 100 MCG/2ML | INTRAMUSCULAR | Qty: 4 | Fill #0

## 2024-04-30 MED FILL — MIDAZOLAM HCL 2 MG/2ML IJ SOLN: 2 mg/mL | INTRAMUSCULAR | Qty: 2 | Fill #0

## 2024-04-30 MED FILL — CLOPIDOGREL BISULFATE 75 MG PO TABS: 75 mg | ORAL | Qty: 1 | Fill #0

## 2024-04-30 NOTE — Plan of Care (Signed)
 Problem: Respiratory - Adult  Goal: Achieves optimal ventilation and oxygenation  Outcome: Progressing     Problem: Pain  Goal: Verbalizes/displays adequate comfort level or baseline comfort level  Outcome: Progressing     Problem: Safety - Adult  Goal: Free from fall injury  Outcome: Progressing     Problem: Skin/Tissue Integrity  Goal: Skin integrity remains intact  Description: 1.  Monitor for areas of redness and/or skin breakdown  2.  Assess vascular access sites hourly  3.  Every 4-6 hours minimum:  Change oxygen saturation probe site  4.  Every 4-6 hours:  If on nasal continuous positive airway pressure, respiratory therapy assess nares and determine need for appliance change or resting period  Outcome: Progressing

## 2024-04-30 NOTE — Progress Notes (Signed)
 TRANSFER - IN REPORT    Patient transferred to Cardiac Cath Holding area bay 7   Placed on monitor. VSS.   Verbal report received from [place], RN    Report consisted of patient's Situation, Background, Assessment and   Recommendations(SBAR).     Right Femoral/Groin Site CDI, Soft, No Hematoma.    Will continue to monitor patient.            ?

## 2024-04-30 NOTE — Procedures (Signed)
 PROCEDURE NOTE  Date: 04/30/2024      OP NOTE  VASCULAR   Angiogram/Intervention    REMI RESTER  04/30/2024    Pre-Op Dx : left lower extremity arterial insufficiency   Post-Op Dx: Same    Procedure:   1. Ultrasound-guided Right common Femoral Artery Access  2. Aortogram  3. left LE Runoff  4. Atherectomy left SFA with CSI 1.5 classic  5. Balloon angioplasty left SFA with stent x2, 5x40 distal SFA and 6x40 mid-SFA  6. Mynx closure device    Surgeon:  Lonni Alberts, MD    Anesthesia :   Fentanyl  125 mcg Versed  2 mg, 56 min of face to face intraservice time moderate conscious sedation is administered and monitored by an independent and qualified and trained RN/RCIS observed under my direct supervision.    Findings: see below.  Comp: None  EBL: Minimal  Specimen: none  Drains: none  Contrast:  45 ml. Visipaque   Heparin  : 8000 U   Protamine : 0 mg  Access: Right femoral artery    Angiographic Findings :  Aorta: Patent, Renal artery origins patent  Iliacs: patent  Femoral Vessels:   -  CFA : Widely Patent   -  SFA : Diffusely stenotic, widely patent post intervention  -  profunda femoris : Widely Patent   Popliteal: Widely Patent    Tibial Vessels:   -  AT : occluded, reconstitutes from peroneal at the foo  -  TP trunk : Widely Patent   -  peroneal : Widely Patent   -  PT : Widely Patent, but diminutive  2 vessel run-off to foot via patent peroneal and PT     Plan : Will need 2 hours bedrest. OK For discharge on DAPT after recovery complete    Indication: H&P reviewed and imaging reviewed per EMR.    Procedure:     The patient was brought to the angio suite, after the placement of monitoring devices, a timeout, and the administration of conscious sedation, both groins were prepped and draped in normal sterile fashion. Local anesthesia was infiltrated in the skin and subcutaneous tissues of the right groin and the right femoral artery was cannulated with a microneedle under ultrasound guidance. Hard copy image was  stored in PACS.  Microwire was advanced and needle exchanged for a microsheath. Microwire was removed and replaced for a a bentson wire. Microsheath was remove dand exchanged for a 5 [place] sheath. Universal flush catheter was then advanced into the abdominal aorta and aortogram obtained. Run-off films were obtained by selecting the left external iliac system using the Universal flush catheter with findings above.    The decision was made to intervene.  A 33F 45 cm sheath was exchanged over a bentson wire into the left CFA . The patient was fully heparinized.  The lesion was crossed with a command glidewire and spex catheter and angiogram confirmed we were in the lumen. I then exchanged for a viper wire and performed atherectomy of the entire length of the SFA with 1.5 classic CSI.  Percutaneous angioplasty with 5mm x  balloon was performed. A stent was  required for 2 separate flow-limiting dissections. Completion angiogram confirmed excellent result.    Next, a short sheath was exchanged over a wire and secured after flushing and aspirating. A closure device was utilized, mynx.    Patient tolerated procedure well; is going to recovery.     Lonni Alberts, MD    04/30/2024 2:52 PM

## 2024-04-30 NOTE — Progress Notes (Incomplete Revision)
 Subjective:     Her daughter reports that vascular surgeon Dr. Maree told her: For plans left-sided angiogram after left foot abscess I&D.  No left foot abscess I& D was done by the podiatry.  Vascular plan for left-sided angiogram this afternoon    Assessment/Plan:     Patient seen for complicated medical problems as listed below :    Right foot abscess   Left foot abscess  S/p I and D in ER  PAD bilateral lower extremities  Chronic hypoxic resp failure  COPD  Possible pneumonia    Plan       Left knee fluid collection on CTA with some fluctuance and pain on exam. ortho or IR evaluation for potential aspiration.   Status post right angiogram on 04/21/24, discussed with vascular surgery.  wound VAC removed, tissue clean, good bleeding.  I spoke to the vascular surgery.  Plan for left-sided angiogram   CT showed bilateral foot abscess.  7/17 s/p OR with I&D and placement of wound VAC on right foot-OR cultures negative so far and awaiting bone path. 7/19 CT still showing multiple abscesses in the right midfoot and subcu area.  Left foot also showed multiple abscess, status post debridement of right ankle as well as I&D of left foot abscess, follow OR culture  Patient hyperkalemia resolved.  CTA lower extremities showing moderate PAD  Continue empiric IV vancomycin /cefepime /Flagyl .  Vanco trough  18.9, dose vancomycin  accordingly..  ID consult appreciated  Continue bronchodilators  O2 supplementation  Dvt ppx with heparin  subcu  Discussed with patient   Continue home regimen for otherwise chronic, stable medical conditions as noted above.   Her daughter reports that vascular surgeon Dr. Maree told her: For plans left-sided angiogram after left foot abscess I&D.  Now left foot abscess ID was done by the podiatry.  I spoke to vascular surgery PA and discussed the above issue.  She will discuss with vascular attending.     Reason for continued hospitalization: Follow OR culture        Objective:         Vitals:     04/29/24 1924 04/29/24 2300 04/30/24 0513 04/30/24 0825   BP: 132/67 128/63 (!) 148/74 (!) 155/76   Pulse: 74 68 84 79   Resp: 17 18 16 18    Temp: 98.4 F (36.9 C) 97.5 F (36.4 C) 97.9 F (36.6 C) 99 F (37.2 C)   TempSrc: Temporal Temporal Temporal Temporal   SpO2: 96% 100% 94% 95%   Weight:       Height:       EXAM:  GENERAL: in mild distress.   RESPIRATORY: Bilateral BS present. No rales or rhonchi.   CARDIOVASCULAR: S1 and S2 present, regular. No murmur  ABDOMEN: Soft and nontender with positive bowel sounds.   NEUROLOGICAL: Cranial nerves II through XII grossly intact. No focal weakness  Extremities, bilateral lower extremities wound. Vac on right side .     Recent Labs     04/30/24  0646   WBC 9.0   HGB 8.2*   HCT 25.8*   MCV 91.5   PLT 590*     Recent Labs     04/30/24  0646   NA 137   K 4.1   CL 99   CO2 30   ANIONGAP 7   CALCIUM  7.9*   BUN 10   CREATININE 0.76   GLUCOSE 79     No results for input(s): LABALBU, TP, AST, ALKPHOS, BILITOT, BILIDIR, ALT,  GLOB in the last 72 hours.    Invalid input(s): AGRAT  Lab Results   Component Value Date/Time    COLORU Yellow 04/15/2024 03:02 AM    CLARITYU Clear 04/15/2024 03:02 AM    GLUCOSEU Negative 04/15/2024 03:02 AM    BILIRUBINUR Negative 04/15/2024 03:02 AM    KETUA Negative 04/15/2024 03:02 AM    BLOODU Trace-lysed 04/15/2024 03:02 AM    PHUR 5.5 04/15/2024 03:02 AM    NITRU Negative 04/15/2024 03:02 AM    LEUKOCYTESUR Trace 04/15/2024 03:02 AM     Recent Labs     04/27/24  1129 04/27/24  1723 04/27/24  2031 04/28/24  0800 04/28/24  1134   POCGLU 82 83 94 81 134*         Discussed with patient and family regarding management, prognosis, treatment and complications of the patient's medical conditions in detail. All questions answered  to the satisfaction of those individual(s) who also verbalized understanding of and agreement with the assessment and plan.     Dr. [place], Garnette RAMAN, MD, thank you for allowing us  to participate in the care of  this patient.  Please contact me through the hospital if questions arise.  Dragon medical dictation software was used for portions of this report. Unintended errors may occur.     Sergio callander MD  [facility]  [phone]

## 2024-04-30 NOTE — Progress Notes (Signed)
 INFECTIOUS DISEASE FOLLOW UP NOTE     Date of admission: 04/14/2024     Date of consult: April 15, 2024        ABX:      Current abx Prior abx    Cefepime  7/14-16  Flagyl  7/15-15 Vanco 7/14-14      ASSESSMENT:       Right foot abscess  -Unclear cause of swelling and pain that has been present for several months  -Has been evaluated by orthopedic  -Patient seen by [facility] specialist Dr. Curtistine Emperor with last visit 03/11/2024 for right contusion of dorsum of foot, lower extremity edema and right foot pronation deformity  -03/08/2024 MRI right hindfoot no fracture.  Mild multifocal osseous edema?  Altered gait.  And mechanical stress-related changes.  Mild intrinsic foot muscular atrophic and intramuscular edema.  Subcu edema along the dorsum of the foot  -xray no fracture  -S/p I&D by ED, cultures unrevealing  - Patient seen by podiatrist Dr. Roberts, OR I and D done 7/17, cultures unrevealing, path consistent with acute on chronic osteomyelitis     Left foot swelling  -Xray no fracture but CT concerning for abscess  - Status post OR I and D 7/25   Leucocytosis POA   AKI/CKD  -Abnormal creatinine few weeks ago on routine blood work at [facility]   PAD  -Patient has been following with vascular surgeon and last seen in office on 04/08/2024  -12/2023 ABI at [facility] with severe left lower extremity arterial insufficiency at rest and right first digital disease with known proximal arterial insufficiency  -12/2023 s/p right lower extremity angiogram showed single-vessel peroneal runoff with good branches   Ch hypoxic resp failure   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  -Has been following with neurosurgery at [facility] artery aneurysm  -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 and following with  [facility] neurosurgery with plan for continued management   Ax- PCN -as a child   Co-morb- COPD, schizoaffective disorder, HTN, depression,             RECOMMENDATIONS:      Complicated 73 year old female with significant comorbidities coming with bilateral feet swelling, right worse than left    Tmax 99.0, other VSS, 95% room air sat  Labs include creatinine 0.76 from 0.90, WBC 9.0, platelets 590 from 476  Micro 7/25 fungal and AFB left foot smear negative, anaerobic NGTD, routine NG TD; 7/17 wound right foot Gram stain negative, culture NG, ANA NG, right heel bone Gram stain negative, culture negative, ANA NG, 7/15 urine less than 10k, superficial wound culture negative, 7/14 blood culture x 2 negative  Imaging 7/21 IR angio  Path 7/17 right heel acute and chronic osteomyelitis       Assessment     Right foot abscess  - Coming with swelling, pain of right foot   - I&D in ED by provider and cultures negative  - CT concerning for abscess on right foot  - 7/17 s/p OR with I&D and placement of wound VAC -or cultures negative   - Path suggestive of acute and chronic osteomyelitis   - 7/19 CT still showing multiple abscesses in the midfoot and subcu area  - SP 7/25 debridement right foot with graft application application of wound VAC by Dr. Rosamond    Left foot swelling  - Tenderness on dorsal aspect  - 7/17 CT suggestive of cellulitis with  focal area of collection?  Abscess and CTA 7/19 poorly defined collections foot and subcu left knee  - Covering with broad-spectrum antibiotic  - OR drainage of abscess left foot on 7/25 with right foot surgery  - Routine and anaerobic culture no growth to date, AFB and fungal added for completeness     PAD  -Previous history of angio but recent ABI with monophasic waveforms in femoral and plan for angio per vascular  -Vascular study 7/16 moderate right iliac-femoral, severe left arterial insufficiency at femoropopliteal region  -s/p angio 7/21 RLE with right SFA intervention      Bilateral leg edema  - Recent PVL 7/8 was negative for DVT but low threshold to repeat     Leukocytosis -normal now     AKI?  CKD  - Avoid nephrotoxic medications and dose for current creatinine clearance  - Renal ultrasound without any stone or obstruction    Other details as above       Rec:  - Continue cefepime  and Flagyl  for now.   - With all cultures this admission to date negative for MRSA and Enterococcus monitor off vancomycin  pending final culture-Unclear duration on antibiotics at this point but right heel path showed osteomyelitis so may need extended course,?  6-week cefepime  plus ~4-week metronidazoles (if no symptoms of neuropathy/toxicity), so far no positive cultures to guide antibiotic selection, patient with penicillin allergy but tolerating current therapy  - Monitor pending cultures from left foot suspected abscess, added AFB and fungal cultures  -Local wound care per others  - Podiatry follow up  - Left knee fluid collection on CTA with some fluctuance and pain on exam. Recommend ortho evaluation for potential aspiration.   - Keep legs elevated on pillows  - Monitor serial labs  - Monitor for any ADE      Ann Olden, DO  Infectious Disease  [facility]      MICROBIOLOGY:      7/14 Blcx x2 NG              Wd cx NG  7/15 Ucx less than 10K UG F  7/17 anaerobic x 2 NG wound NOS, NG surgical wound NG right heel  7/25 anaerobic no growth to date  Routine Gram stain rare WBC NOS, NGTD  AFB IP  Fungal IP     LINES AND CATHETERS:      PIV     SUMMARY:      Susan [name] is a 73 year old  female with past medical history of COPD, schizoaffective disorder, hypertension, depression, PAD, peripheral neuropathy was admitted on 7/14 with worsening leg pain.     Patient is not a good historian and says lives with her daughter.  She has been almost bedbound for quite some time. Pt has been following with [facility] for basilar artery aneurysm and conservative Mx.  Patient also developed right  leg swelling for which she was referred to Ortho and eventually podiatry.  It was not thought to be the part of her back leading to right leg swelling and neuropathy.  Patient also following with vascular and had procedure done in March with good runoff and did not think was concern for pain and swelling.  Patient last seen by podiatry 03/11/2024 and plan for conservative management.      She was seen in ED on 8 July with leg swelling.  Workup showed negative PVL for DVT and chest x-ray without any significant finding.  She was noted with pitting  edema and was given some Lasix  and asked to follow-up with PCP for adjustment in medications.  Patient returned 7/14 with worsening swelling of the legs, right worse than left.  Workup showed stable vitals, afebrile state.  WBC elevated at 19, Hb 9.8, PLT 354, BUN 34, CR 1.43, AST 17, ALT 14.  Blood cultures were obtained.  Patient had bilateral feet x-ray which showed no fracture or concern for osteo.  Chest x-ray was suggestive of left basilar opacity with developing pneumonia versus atelectasis.  Patient had I&D in the ED.  Podiatry was consulted and CT was ordered.  Wound culture in progress.  Patient on broad-spectrum antibiotic with cefepime  and Vanco.  ID was asked for further evaluation management.       SUBJECTIVE :     Interval notes reviewed. Afebrile overnight. No new complaints. Endorses continued foot pain and L knee pain  OBJECTIVE     BP (!) 155/76   Pulse 79   Temp 99 F (37.2 C) (Temporal)   Resp 18   Ht 1.702 m (5' 7)   Wt 81.6 kg (179 lb 14.3 oz)   SpO2 95%   BMI 28.18 kg/m     Temp (24hrs), Avg:98 F (36.7 C), Min:97.5 F (36.4 C), Max:99 F (37.2 C)    General: Fairly developed and nourished 73 y.o. year-old, female, chronically sick  HEENT: Normocephalic, anicteric sclerae   Neck: Supple   Chest: Symmetrical expansion  Abdomen: Soft, non-tender,non distended, no organomegaly, BS+  Musculoskeletal: Right foot wound VAC  left foot postop  dressing clean dry intact not removed; L  knee with subpatellar effusion, TTP  CNS: AAOx3.  Follows simple commands.  SKIN: No obvious skin lesion or rash. Dry, warm, intact     MEDICATIONS:     Current Facility-Administered Medications   Medication Dose Route Frequency Provider Last Rate Last Admin    valsartan  (DIOVAN ) tablet 160 mg  160 mg Oral BID Josepha Planas, DO   160 mg at 04/30/24 9093    amLODIPine  (NORVASC ) tablet 5 mg  5 mg Oral Daily Josepha Planas, DO   5 mg at 04/30/24 9093    carvedilol  (COREG ) tablet 12.5 mg  12.5 mg Oral BID WC Josepha Planas, DO   12.5 mg at 04/30/24 0906    potassium & sodium phosphates  (PHOS-NAK) 280-160-250 MG packet 250 mg  1 packet Oral 4x Daily Josepha Planas, DO   250 mg at 04/30/24 9088    lidocaine  PF 1 % injection 1 mL  1 mL IntraDERmal Once Vyas, Shruti, DPM        dextrose  50 % IV solution  10-50 mL IntraVENous PRN Josepha Planas, DO   50 mL at 04/25/24 1000    glucagon  injection 1 mg  1 mg IntraMUSCular PRN Josepha Planas, DO        cefepime  (MAXIPIME ) 1,000 mg in sodium chloride  0.9 % 50 mL IVPB (mini-bag)  1,000 mg IntraVENous Q8H Matriano, Norleen Deward DEL, MD   Stopped at 04/30/24 0911    atorvastatin  (LIPITOR ) tablet 40 mg  40 mg Oral Nightly Maree Ladona HERO, MD   40 mg at 04/29/24 2103    aspirin  EC tablet 81 mg  81 mg Oral Daily Maree Ladona HERO, MD   81 mg at 04/30/24 9092    clopidogrel  (PLAVIX ) tablet 75 mg  75 mg Oral Daily Maree Ladona HERO, MD   75 mg at 04/30/24 0906    ipratropium 0.5 mg-albuterol  2.5 mg (DUONEB ) nebulizer solution 1  Dose  1 Dose Inhalation Q4H PRN Sahibzada, Ayaz, MD        senna (SENOKOT) tablet 8.6 mg  1 tablet Oral Daily PRN Matriano, Norleen Deward DEL, MD        docusate sodium  (COLACE) capsule 100 mg  100 mg Oral BID PRN Murrell Norleen Deward DEL, MD        ondansetron  (ZOFRAN ) injection 4 mg  4 mg IntraVENous Q6H PRN Murrell Norleen Deward DEL, MD   4 mg at 04/21/24 1544    acetaminophen  (TYLENOL ) tablet 650 mg  650 mg Oral Q6H PRN Murrell Norleen Deward DEL, MD   650 mg at  04/28/24 2100    melatonin tablet 3 mg  3 mg Oral Nightly PRN Murrell Norleen Deward DEL, MD        naloxone  (NARCAN ) injection 0.4 mg  0.4 mg IntraVENous PRN Murrell Norleen Deward DEL, MD        heparin  (porcine) injection 5,000 Units  5,000 Units SubCUTAneous BID Rosslyn Scrape, MD   5,000 Units at 04/30/24 9092    benzonatate  (TESSALON ) capsule 100 mg  100 mg Oral TID PRN Murrell Norleen Deward DEL, MD        morphine  (PF) injection 1 mg  1 mg IntraVENous Q3H PRN Murrell Norleen Deward DEL, MD   1 mg at 04/30/24 9352    sodium hypochlorite (DAKINS) 0.125 % external solution   Irrigation Daily Anastasio Adine LABOR, DPM   Given at 04/29/24 0920    metroNIDAZOLE  (FLAGYL ) 500 mg in 0.9% NaCl 100 mL IVPB premix  500 mg IntraVENous Q8H Meade Viviane RAMAN, MD   Stopped at 04/30/24 0457    oxyCODONE -acetaminophen  (PERCOCET ) 7.5-325 MG per tablet 1 tablet  1 tablet Oral Q4H PRN Josepha Planas, DO   1 tablet at 04/29/24 1220         Labs: Results:   Chemistry Recent Labs     04/28/24  0658 04/29/24  0954 04/30/24  0646   NA 136 138 137   K 4.5 4.5 4.1   CL 99 98 99   CO2 29 28 30    BUN 15 12 10       CBC w/Diff Recent Labs     04/28/24  0658 04/29/24  0954 04/30/24  0646   WBC 8.5 9.1 9.0   RBC 2.84* 3.18* 2.82*   HGB 8.3* 9.3* 8.2*   HCT 26.9* 29.8* 25.8*   PLT 560* 476* 590*          RADIOLOGY :        Imaging  CTA ABDOMINAL AORTA W BILAT RUNOFF W WO CONTRAST  Addendum Date: 04/24/2024  HISTORY: peripheral artery disease Technique: Noncontrast CT imaging is acquired through the chest for bolus tracking purposes. Enhanced CT imaging through the abdomen, pelvis, and lower extremities is performed during arterial phase of IV contrast menstruation as part of a CTA runoff. Angiographic reconstructions are performed formed at a separate workstation. The patient received an uneventful IV bolus of 100 mL of Omnipaque 350 nonionic contrast as part of the study. 3D angiographic post processing and image rendering was performed by the CT technologist at a  separate workstation including coronal and sagittal maximal intensity projection (MIP) reconstructions. COMPARISON STUDY: none FINDINGS: Vascular structures and lower extremities: Abdominal aorta is normal in caliber and enhancement. Celiac, SMA, and renal arteries are moderately calcified yet patent with no high-grade stenosis. IMA is patent. Mild narrowing of the left common iliac artery. The right common iliac and external  iliac arteries are patent. Hypogastric arteries are patent. On the right, common femoral and profunda are patent. Diffuse mild disease of the right SFA with moderate grade narrowing in the mid SFA just proximal to the patent stent. The right popliteal artery is patent. Single-vessel runoff to the right foot through the peroneal artery. There is gradual diminished enhancement in the anterior and posterior tibial arteries. The ankle. Ulcerations are present in the right foot with subcutaneous gas and a vacuum. 2 cm abscess in the plantar subcutaneous tissues with an air-fluid level noted. Additional 2.4 cm abscess in the dorsal subcutaneous tissues of the mid foot noted with an air-fluid level. On the left, common femoral artery is mildly narrowed secondary to plaque and the profunda is patent. Diffuse moderate disease of the left SFA with partially calcified plaque. Popliteal artery is patent. Moderate grade stenosis at the proximal posterior tibial artery. Two-vessel runoff via the posterior tibial and peroneal arteries. Diminished enhancement noted in the distal anterior tibial artery. Along the lateral aspect of the midfoot there is a 2.5 x 4.6 cm area of low density which may represent a developing abscess or phlegmon. There is also a region of edema in the anterior left lower leg at the level of the ankle. At the level of the knee, there is a irregular shaped fluid collection in the anterolateral knee just lateral to the tibial tuberosity which measures approximately 4.2 cm in diameter.  Bibasilar subsegmental atelectasis. Heart size is borderline enlarged. The liver has a slight nodular contour. The gallbladder is present and contains excreted contrast. The spleen, pancreas, and adrenal glands are within normal notes. 2.6 cm left renal cyst. Kidneys are otherwise normal in morphology with no hydronephrosis. Appendix and small bowel loops are unremarkable. Diverticulosis is noted throughout the colon. No free intraperitoneal fluid or air. Uterus contains a coarsely calcified leiomyoma. No adnexal or pelvic mass. Urinary bladder is unremarkable. Osseous structures the pelvis and lumbar spine are intact. No compression deformities. IMPRESSION: 1. No aortic inflow stenosis. 2. Moderate grade stenosis of the mid right SFA and patent SFA stent. Patent right popliteal artery with single vessel runoff to the right foot. 3. Diffuse moderate disease of the left SFA with patent popliteal artery and diseased two-vessel runoff to the left foot. Moderate grade stenosis in the proximal left posterior tibial artery. 4. Open wound in the right foot with subcutaneous gas and multiple abscesses in the mid foot along the plantar surface and dorsal subcutaneous tissues. 5. Poorly defined fluid collections in the left foot and in the subcutaneous tissues of the anterior left knee which are concerning for additional abscesses. Electronically signed by: Freda Lanes, MD 04/24/2024 5:46 PM EDT          Workstation ID: [id number]     Result Date: 04/24/2024  HISTORY: peripheral artery disease Technique: Noncontrast CT imaging is acquired through the chest for bolus tracking purposes. Enhanced CT imaging through the abdomen, pelvis, and lower extremities is performed during arterial phase of IV contrast menstruation as part of a CTA runoff. Angiographic reconstructions are performed formed at a separate workstation. The patient received an uneventful IV bolus of 100 mL of Omnipaque 350 nonionic contrast as part of the  study. COMPARISON STUDY: none FINDINGS: Vascular structures and lower extremities: Abdominal aorta is normal in caliber and enhancement. Celiac, SMA, and renal arteries are moderately calcified yet patent with no high-grade stenosis. IMA is patent. Mild narrowing of the left common iliac artery. The right common iliac and  external iliac arteries are patent. Hypogastric arteries are patent. On the right, common femoral and profunda are patent. Diffuse mild disease of the right SFA with moderate grade narrowing in the mid SFA just proximal to the patent stent. The right popliteal artery is patent. Single-vessel runoff to the right foot through the peroneal artery. There is gradual diminished enhancement in the anterior and posterior tibial arteries. The ankle. Ulcerations are present in the right foot with subcutaneous gas and a vacuum. 2 cm abscess in the plantar subcutaneous tissues with an air-fluid level noted. Additional 2.4 cm abscess in the dorsal subcutaneous tissues of the mid foot noted with an air-fluid level. On the left, common femoral artery is mildly narrowed secondary to plaque and the profunda is patent. Diffuse moderate disease of the left SFA with partially calcified plaque. Popliteal artery is patent. Moderate grade stenosis at the proximal posterior tibial artery. Two-vessel runoff via the posterior tibial and peroneal arteries. Diminished enhancement noted in the distal anterior tibial artery. Along the lateral aspect of the midfoot there is a 2.5 x 4.6 cm area of low density which may represent a developing abscess or phlegmon. There is also a region of edema in the anterior left lower leg at the level of the ankle. At the level of the knee, there is a irregular shaped fluid collection in the anterolateral knee just lateral to the tibial tuberosity which measures approximately 4.2 cm in diameter. Bibasilar subsegmental atelectasis. Heart size is borderline enlarged. The liver has a slight  nodular contour. The gallbladder is present and contains excreted contrast. The spleen, pancreas, and adrenal glands are within normal notes. 2.6 cm left renal cyst. Kidneys are otherwise normal in morphology with no hydronephrosis. Appendix and small bowel loops are unremarkable. Diverticulosis is noted throughout the colon. No free intraperitoneal fluid or air. Uterus contains a coarsely calcified leiomyoma. No adnexal or pelvic mass. Urinary bladder is unremarkable. Osseous structures the pelvis and lumbar spine are intact. No compression deformities.     IMPRESSION: 1. No aortic inflow stenosis. 2. Moderate grade stenosis of the mid right SFA and patent SFA stent. Patent right popliteal artery with single vessel runoff to the right foot. 3. Diffuse moderate disease of the left SFA with patent popliteal artery and diseased two-vessel runoff to the left foot. Moderate grade stenosis in the proximal left posterior tibial artery. 4. Open wound in the right foot with subcutaneous gas and multiple abscesses in the mid foot along the plantar surface and dorsal subcutaneous tissues. 5. Poorly defined fluid collections in the left foot and in the subcutaneous tissues of the anterior left knee which are concerning for additional abscesses. Electronically signed by: Daryle Darden, MD 04/19/2024 2:45 PM EDT          Workstation ID: [id number]     IR ANGIOGRAM EXTREMITY RIGHT  Result Date: 04/21/2024  Radiology exam is complete. No Radiologist dictation. Please follow up with ordering provider.     CT FOOT LEFT W CONTRAST  Result Date: 04/17/2024  HISTORY: Swelling and pain. TECHNIQUE: Helical acquisition CT images through the left foot was performed with contrast with multiplanar reformats. COMPARISON: None. FINDINGS: No visualized acute fracture or dislocation. No evidence of periosteal reaction or bony destruction to suggest osteomyelitis. There is extensive subcutaneous fat edema and stranding within the lateral soft  tissues from the level of the mid foot to the forefoot. In particular, there is a prominent area of focal fluid measuring approximately 3.8 x 2.1 cm on image  20, series 5 that extends superficially to abut the skin and may represent a developing abscess. There is no definite visualized enhancing wall.     IMPRESSION: 1. Findings are compatible with cellulitis of the lateral midfoot and forefoot with more focal 3.8 cm area of fluid at the level of the midfoot that is suspicious for an early developing abscess as there is no discrete capsule. 2. No evidence of osteomyelitis. Electronically signed by: Lillia Hug, MD 04/17/2024 11:37 AM EDT          Workstation ID: [id number]     LE ARTERIAL  Result Date: 04/16/2024                                                                                                                                    Version: 1                                                                                                                                   Study ID: [id number]                                                       [facility]                                                       [address] [place], [place]  [address]  Arterial Lower Extremity Report Name: Susan [name], Susan [name] Date: 04/15/2024, 2: 11 PM MRN: [id number]                                                               Patient Location: [facility] Age: 25 Years                                                               DOB: [birth year] (MM/DD/YYYY)                                                                               Gender: Female Performed By: Edilia Romelia Elden Sherrell, RVT                          Account #:: [id number] Ordering Physician: ANASTASIO CAIN A Reason For Study: Peripheral vascular  disease.                                                     INTERPRETATION SUMMARY 1. Moderate right lower extremity arterial insufficiency at the level of the iliac-femoral region. 2. Severe left lower extremity arterial insufficiency at the level of the femoral-popliteal region. 3. Digital pressures deferred due to patient's pain. QUALITY/PROCEDURE Arterial Physio ABI [id number]). A bilateral lower extremity arterial exam was performed. Continuous wave Doppler tracings were recorded from the common femoral, popliteal, posterior tibial and dorsalis pedis arteries bilaterally, and resting ankle brachial pressures were measured. Digital pressures deferred due to patient's pain. RIGHT LEG SEGMENTAL Brachial = 131 mmHg. Ankle Pressure (PT) = 81 mmHg. Ankle Pressure (DP) = 83 mmHg. Right ABI = 0.63. The lower extremity ankle brachial index is 0.63 on the right. The right lower extremity waveforms are monophasic in the femoral, the popliteal, the posterior tibial and the dorsalis pedis arteries. RIGHT DIGIT PPG Digit 1 flow is absent. LEFT LEG SEGMENTAL Brachial = 126 mmHg. Ankle (PT) = 59 mmHg. Ankle (DP) = 63 mmHg. Left ABI = 0.48. The lower extremity ankle brachial index is 0.48 on the left. The left lower extremity waveforms are multiphasic in the femoral, and monophasic in the popliteal, the posterior tibial and the dorsalis pedis arteries. LEFT DIGIT PPG Digit 1 flow is absent. ______________________________________________________________________________ Electronically signed by: Dr Blenda Fear, M.D  04/16/2024, 10: 01 PM     US  RETROPERITONEAL COMPLETE  Result Date: 04/15/2024  INDICATION: Acute renal failure. COMPARISON: None. TECHNIQUE: Transabdominal grayscale and duplex sonography of the retroperitoneum performed. FINDINGS: RIGHT KIDNEY:  10.7 cm in length. (Normal range 9-13 cm) Normal renal parenchymal echogenicity. No obstruction, renal calculi or focal lesion. LEFT KIDNEY: 9.8 cm in  length. (Normal range 9-13 cm) Normal renal parenchymal echogenicity.  And interpolar simple-appearing partially exophytic left renal cyst measures 2.6 x 2.6 x 2.1 cm. URINARY BLADDER: Bladder has a pre-void volume of 240.0 mL. (<50 mL WNL) Patient did not void for the exam. Neither ureteral jet(s) identified. No urinary bladder wall thickening or debris within the urinary bladder. (Normal wall thickness <3 mm distended, <5 mm underdistended.) Bladder contains a Foley catheter and is decompressed. PANCREAS:  Visualized pancreas is within normal limits. IVC: Patent. ABDOMINAL AORTA: Normal caliber abdominal aorta. COMMON ILIAC ARTERIES: Suboptimally visualized secondary to interposed bowel gas.     IMPRESSION: 1.  No hydronephrosis. Kidneys unremarkable sonographically with the exception of a partially exophytic simple-appearing interpolar left renal cyst. 2.  Urinary bladder unremarkable sonographically. 3.  Common iliac arteries above the visualized sonographically due to interposed bowel gas Electronically signed by: Alm Conte, MD 04/15/2024 8:34 PM EDT          Workstation ID: [id number]     CT FOOT RIGHT W CONTRAST  Result Date: 04/15/2024  Indication: Right foot infection. Evaluate for abscess. Comparison: None available Technique: Computed tomographic imaging of the right foot was performed following IV contrast administration. Coronal and sagittal reconstructions were created from the initial axial data set and submitted for interpretation. Findings: Complex gas and fluid containing abscess//phlegmon in the subcutaneous lateral hindfoot spanning approximately 4.4 cm craniocaudal by 2 x 1.5 cm transverse. This is positioned posterior to the peroneal tendons which are intact and not overtly involved. There is subtle loss of bony cortex/rarefaction at the lateral margin of the subjacent calcaneus concerning for osteomyelitis.     IMPRESSION: 1. Subcutaneous phlegmon/abscess lateral right hindfoot and  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 of the subjacent lateral margin of the calcaneus. Electronically signed by: Glade Reeve, MD 04/15/2024 2:16 PM EDT          Workstation ID: [id number]     XR FOOT RIGHT (2 VIEWS)  Result Date: 04/15/2024  Exam: 2 views of the right foot. Clinical indications:  PAIN. RULE OUT OSTEOMYELITIS. Right foot pain and swelling. Comparison:   04/15/2024;  Findings:  No fracture.  No dislocation. No radiopaque foreign bodies. No bone erosions.     Impression:  No fracture. No bone erosions to suggest osteomyelitis. Electronically signed by: Celine Rear, MD 04/15/2024 12:52 AM EDT          Workstation ID: [id number]     XR FOOT LEFT (2 VIEWS)  Result Date: 04/15/2024  Exam: 2 views of the left foot. Clinical indications:  PAIN right foot. Swelling. Possible osteomyelitis. Comparison:  None Findings:  No fracture.  No dislocation. No bone erosions. No radiopaque foreign bodies.     Impression:  No fracture. Electronically signed by: Celine Rear, MD 04/15/2024 12:51 AM EDT          Workstation ID: [id number]     XR CHEST PORTABLE  Result Date: 04/15/2024  Exam: AP portable chest Clinical indication: SEPSIS Comparison: 04/08/2024;  Results:  Left basilar opacity. No pneumothorax. Heart normal.  Mediastinal contours are normal. No free air is seen under the hemidiaphragms.   Osseous structures intact.  IMPRESSION: Left basilar opacity. Developing pneumonia versus atelectasis. Electronically signed by: Celine Rear, MD 04/15/2024 12:41 AM EDT          Workstation ID: [id number]     VL DUP LOWER EXTREMITY VENOUS BILATERAL  Result Date: 04/09/2024                                                                                                                                    Version: 1                                                                                                                                   Study ID: [id number]                                                       [facility]                                                       [address] [place], [place]  [address]                                                      Lower Extremity Venous Duplex Report Name: NAYDENE, KAMROWSKI Date: 04/08/2024, 2:  50 PM MRN: [id number]                                                               Patient Location: ER^ERWT^WT^[facility] Age: 26 Years                                                               DOB: 1951/05/19 (MM/DD/YYYY)                                                                               Gender: Female Performed By: Kylee R. Holloman, RVT                                       Account #:: [id number] Ordering Physician: [facility], [facility] Reason For Study: Lower extremity pain and swelling                                                     INTERPRETATION SUMMARY 1. No evidence of deep vein thrombosis in the bilateral lower extremities. QUALITY/PROCEDURE Complete bilateral venous duplex performed. Quality of the study is good. Location: ER. RIGHT LEG The deep venous system of the right lower extremity was examined using duplex ultrasound. B-mode imaging demonstrated normal compressibility of the right common femoral, femoral, popliteal, posterior tibial and peroneal veins. Doppler flow signals were spontaneous and phasic at all levels. RIGHT SAPHENOUS VEINS Right great saphenous vein at the saphenofemoral junction demonstrates normal compressibility with spontaneous and phasic venous hemodynamics. LEFT LEG The deep venous system of the left lower extremity was examined using duplex ultrasound. B-mode imaging demonstrated normal compressibility of the left common femoral, femoral, popliteal, posterior tibial and peroneal veins. Doppler flow signals were spontaneous and phasic at all levels. LEFT LEG SAPHENOUS VEINS Left great saphenous vein at the  saphenofemoral junction demonstrates normal compressibility with spontaneous and phasic venous hemodynamics. ______________________________________________________________________________ Electronically signed by: Dr Blenda Fear, M.D                       04/09/2024, 8: 57 AM     XR CHEST (2 VW)  Result Date: 04/08/2024  History: Shortness of breath Comparison: None Technique: PA and lateral radiographs of the chest were obtained. Two views, 2 exposures total. Findings: The lungs and pleural spaces are clear. Normal cardiomediastinal silhouette. Degenerative changes of the thoracic spine.     IMPRESSION: No acute cardiopulmonary disease. Electronically signed by: Norleen Gleason 04/08/2024 11:20 AM EDT          Workstation ID: [id number]  I have independently reviewed lab studies and imgaing as well as review of nursing notes and physican notes from the past 24 hours.    Dragon medical dictation software was used for portions of this report. Unintended errors may occur.     ANN OLDEN, DO  April 30, 2024    [facility]   Office [phone]  [phone]

## 2024-04-30 NOTE — OR Nursing (Signed)
 Patient positioned supine on procedure table.  CM in place.

## 2024-04-30 NOTE — Wound Image (Signed)
 Attempted to see patient for right foot wound vac dressing change. Patient is scheduled to have a vascular procedure today. Will see patient later as my schedule allows for wound vac dressing change.     Thank you,    Eleanor Carol, RN, BSN  [facility]  Inpatient Wound, Ostomy, Continence Nurse

## 2024-04-30 NOTE — Care Coordination-Inpatient (Signed)
 SNF Approved - Level 1 wound care only  [facility]  [address]  AUTH# [id number]   Valid 04/30/24 to 05/02/24  Phone [phone], ext [phone]  Fax# [phone]  Case Manager - [place] MATSU.  Next review Date 05/02/24  Last day to admit 05/02/24 before 11:59pm

## 2024-04-30 NOTE — Progress Notes (Signed)
 TRANSFER - OUT REPORT:    Verbal report given to Tammy RN on Susan [name]  being transferred to cath holding 7 for routine post-op       Report consisted of patient's Situation, Background, Assessment and   Recommendations(SBAR).     Information from the following report(s) Nurse Handoff Report was reviewed with the receiving nurse.           Lines:   Peripheral IV 04/30/24 Left Antecubital (Active)   Site Assessment Clean, dry & intact 04/30/24 1207   Line Status Flushed;Infusing 04/30/24 1207   Line Care Connections checked and tightened 04/30/24 1207   Phlebitis Assessment No symptoms 04/30/24 1207   Infiltration Assessment 0 04/30/24 1207   Alcohol Cap Used Yes 04/30/24 1207   Dressing Status New dressing applied 04/30/24 1207   Dressing Type Transparent 04/30/24 1207   Dressing Intervention New 04/30/24 1207        Opportunity for questions and clarification was provided.      Patient transported with:  Registered Nurse

## 2024-04-30 NOTE — Progress Notes (Addendum)
 Subjective:     Her daughter reports that vascular surgeon Dr. Maree told her: For plans left-sided angiogram after left foot abscess I&D.  No left foot abscess I& D was done by the podiatry.  Vascular plan for left-sided angiogram this afternoon    Assessment/Plan:     Patient seen for complicated medical problems as listed below :    Right foot abscess   Left foot abscess  S/p I and D in ER  PAD bilateral lower extremities  Chronic hypoxic resp failure  COPD  Possible pneumonia    Plan       Left knee fluid collection on CTA with some fluctuance and pain on exam. ortho or IR evaluation for potential aspiration.   Status post right angiogram on 04/21/24, discussed with vascular surgery.  wound VAC removed, tissue clean, good bleeding.  I spoke to the vascular surgery.  Plan for left-sided angiogram   CT showed bilateral foot abscess.  7/17 s/p OR with I&D and placement of wound VAC on right foot-OR cultures negative so far and awaiting bone path. 7/19 CT still showing multiple abscesses in the right midfoot and subcu area.  Left foot also showed multiple abscess, status post debridement of right ankle as well as I&D of left foot abscess, follow OR culture  Patient hyperkalemia resolved.  CTA lower extremities showing moderate PAD  Continue empiric IV vancomycin /cefepime /Flagyl .  Vanco trough  18.9, dose vancomycin  accordingly..  ID consult appreciated  Continue bronchodilators  O2 supplementation  Dvt ppx with heparin  subcu  Discussed with patient   Continue home regimen for otherwise chronic, stable medical conditions as noted above.   Her daughter reports that vascular surgeon Dr. Maree told her: For plans left-sided angiogram after left foot abscess I&D.  Now left foot abscess ID was done by the podiatry.  I spoke to vascular surgery PA and discussed the above issue.  She will discuss with vascular attending.     Reason for continued hospitalization: Follow OR culture        Objective:         Vitals:     04/29/24 1924 04/29/24 2300 04/30/24 0513 04/30/24 0825   BP: 132/67 128/63 (!) 148/74 (!) 155/76   Pulse: 74 68 84 79   Resp: 17 18 16 18    Temp: 98.4 F (36.9 C) 97.5 F (36.4 C) 97.9 F (36.6 C) 99 F (37.2 C)   TempSrc: Temporal Temporal Temporal Temporal   SpO2: 96% 100% 94% 95%   Weight:       Height:       EXAM:  GENERAL: in mild distress.   RESPIRATORY: Bilateral BS present. No rales or rhonchi.   CARDIOVASCULAR: S1 and S2 present, regular. No murmur  ABDOMEN: Soft and nontender with positive bowel sounds.   NEUROLOGICAL: Cranial nerves II through XII grossly intact. No focal weakness  Extremities, bilateral lower extremities wound. Vac on right side .     Recent Labs     04/30/24  0646   WBC 9.0   HGB 8.2*   HCT 25.8*   MCV 91.5   PLT 590*     Recent Labs     04/30/24  0646   NA 137   K 4.1   CL 99   CO2 30   ANIONGAP 7   CALCIUM  7.9*   BUN 10   CREATININE 0.76   GLUCOSE 79     No results for input(s): LABALBU, TP, AST, ALKPHOS, BILITOT, BILIDIR, ALT,  GLOB in the last 72 hours.    Invalid input(s): AGRAT  Lab Results   Component Value Date/Time    COLORU Yellow 04/15/2024 03:02 AM    CLARITYU Clear 04/15/2024 03:02 AM    GLUCOSEU Negative 04/15/2024 03:02 AM    BILIRUBINUR Negative 04/15/2024 03:02 AM    KETUA Negative 04/15/2024 03:02 AM    BLOODU Trace-lysed 04/15/2024 03:02 AM    PHUR 5.5 04/15/2024 03:02 AM    NITRU Negative 04/15/2024 03:02 AM    LEUKOCYTESUR Trace 04/15/2024 03:02 AM     Recent Labs     04/27/24  1129 04/27/24  1723 04/27/24  2031 04/28/24  0800 04/28/24  1134   POCGLU 82 83 94 81 134*         Discussed with patient and family regarding management, prognosis, treatment and complications of the patient's medical conditions in detail. All questions answered  to the satisfaction of those individual(s) who also verbalized understanding of and agreement with the assessment and plan.     Dr. [place], Garnette RAMAN, MD, thank you for allowing us  to participate in the care of  this patient.  Please contact me through the hospital if questions arise.  Dragon medical dictation software was used for portions of this report. Unintended errors may occur.     Sergio callander MD  [facility]  [phone]

## 2024-04-30 NOTE — Pre Sedation (Signed)
 Sedation Pre-Procedure Note    Patient Name: Susan [name]   Date of Birth:11-29-1950  Room/Bed: 5212/5212  Medical Record Number: [id number]  Date: 04/30/2024   Time: 10:51 AM       Indication:  Left foot wound, PAD    Consent: I have discussed with the patient and/or the patient representative the indication, alternatives, and the possible risks and/or complications of the planned procedure and the anesthesia methods. The patient and/or patient representative appear to understand and agree to proceed.    Vital Signs:   Vitals:    04/30/24 0825   BP: (!) 155/76   Pulse: 79   Resp: 18   Temp: 99 F (37.2 C)   SpO2: 95%       Past Medical History:   has a past medical history of Hyperlipidemia and Hypertension.    Past Surgical History:   has a past surgical history that includes Leg Surgery (Right, 04/17/2024) and Leg Surgery (Bilateral, 04/25/2024).    Medications:   Scheduled Meds:    valsartan   160 mg Oral BID    amLODIPine   5 mg Oral Daily    carvedilol   12.5 mg Oral BID WC    potassium & sodium phosphates   1 packet Oral 4x Daily    lidocaine  PF  1 mL IntraDERmal Once    cefepime   1,000 mg IntraVENous Q8H    atorvastatin   40 mg Oral Nightly    aspirin   81 mg Oral Daily    clopidogrel   75 mg Oral Daily    heparin  (porcine)  5,000 Units SubCUTAneous BID    sodium hypochlorite   Irrigation Daily    metroNIDAZOLE   500 mg IntraVENous Q8H     Continuous Infusions:   PRN Meds: dextrose , glucagon  (rDNA), ipratropium 0.5 mg-albuterol  2.5 mg, senna, docusate sodium , ondansetron , acetaminophen , melatonin, naloxone , benzonatate , morphine , oxyCODONE -acetaminophen   Home Meds:   Prior to Admission medications    Medication Sig Start Date End Date Taking? Authorizing Provider   amLODIPine  (NORVASC ) 5 MG tablet Take 1 tablet by mouth daily   Yes [provider]   furosemide  (LASIX ) 40 MG tablet Take 1 tablet by mouth daily 01/22/24  Yes [provider]   carvedilol  (COREG ) 12.5 MG tablet Take 1 tablet by mouth 2  times daily (with meals) 01/22/24  Yes [provider]   atorvastatin  (LIPITOR ) 80 MG tablet Take 1 tablet by mouth nightly 01/30/24  Yes [provider]   GEMTESA 75 MG TABS tablet Take 1 tablet by mouth daily 01/23/24  Yes [provider]   VRAYLAR 1.5 MG capsule Take 1 capsule by mouth daily   Yes [provider]   clopidogrel  (PLAVIX ) 75 MG tablet Take 1 tablet by mouth daily   Yes [provider]   QUEtiapine (SEROQUEL) 100 MG tablet Take 1 tablet by mouth daily   Yes [provider]   pantoprazole (PROTONIX) 40 MG tablet Take 1 tablet by mouth daily   Yes [provider]   potassium chloride (KLOR-CON) 10 MEQ extended release tablet Take 1 tablet by mouth daily with food   Yes [provider]   valsartan  (DIOVAN ) 160 MG tablet Take 1 tablet by mouth 2 times daily   Yes [provider]   MYRBETRIQ 25 MG TB24 Take 1 tablet by mouth daily   Yes [provider]   DOMINIC BECK 200-62.5-25 MCG/ACT AEPB inhaler Inhale 1 puff into the lungs daily   Yes [provider]  Coumadin Use Last 7 Days:  no  Antiplatelet drug therapy use last 7 days: yes - asa, plavix   Other anticoagulant use last 7 days: no  Additional Medication Information:  See Above      Pre-Sedation Documentation and Exam:   I have personally completed a history, physical exam & review of systems for this patient (see notes).  Vital signs have been reviewed (see flow sheet for vitals).  I have reviewed the patient's history and review of systems.    Mallampati Airway Assessment:  Mallampati Class II - (soft palate, fauces & uvula are visible)    Prior History of Anesthesia Complications:   none    ASA Classification:  Class 3 - A patient with severe systemic disease that limits activity but is not incapacitating    Sedation/ Anesthesia Plan:   intravenous sedation    Medications Planned:   midazolam  (Versed ) intravenously and fentanyl   intravenously    Patient is an appropriate candidate for plan of sedation: yes    Electronically signed by [name], PA-C on 04/30/2024 at 10:51 AM

## 2024-04-30 NOTE — Progress Notes (Signed)
 NUTRITION RECOMMENDATIONS:   When medically feasible, recommend CCHO5 + 2 g Na diet  Resume Glucerna BID (220 kcals, 10 g protein each) upon diet adavnce. Monitor for need to change to Nepro.   Resume Juven BID (provides 95 kcals, 2.5 g collagen protein, 300 mg vitamin C, 9.5 mg zinc each) upon diet advance.   Monitor lytes & replete/restrict prn   Glucomander per protocol for BG control prn   Strict I/Os  Daily wts to trend   Consider multivitamin  Discharge recommendations: Anticipate CCHO as needed, Juven 14 days or until wounds healed      NUTRITION FOLLOW-UP      Current Intake: poor  Diet NPO Exceptions are: Sips of Water  with Meds Per last documentation in flow sheets dated 7/27, pt with  PO Meals Eaten (%): 1 - 25% . Previously on regular diet, currently NPO.   Estimated average intake provides approximately 532 kcal (23%), 25 g protein (24%) daily.      Subjective: Pt off the floor for scheduled procedure. Currently NPO, intake remains poor.      Pertinent Medications: norvasc , aspirin , lipitor  (FDI), coreg  (FDI), maxipime , plavix , heparin , flagyl , phos-nak, diovan   PRN: morphine , percocet      Pertinent Labs: albumin 1.7 L    Current Inpatient Weight Trends:    Admit wt: 81.6 kg on 7/14  Current wt: 81.6 kg on 7/25    Wt Readings from Last 3 Encounters:   04/25/24 81.6 kg (179 lb 14.3 oz)   04/08/24 81.6 kg (180 lb)   12/19/23 88.5 kg (195 lb)      BMI: Body mass index is 28.18 kg/m.                  GI Symptoms/Issues: Last BM 7/28; loss of appetite per flowsheet.   Chewing/Swallowing Issues: None noted.   Skin Integrity: L heel wound, L sacral wound, R heel incision-on wound vac, L/R foot incision  Fluid Accumulation/Edema: Per I/Os, pt is -5.7 L since 7/16.   Edema: Generalized          Physical Assessment: on 7/30 - pt off the floor  Fat Wasting: not assessed  Muscle Wasting:  not assessed    GLIM Criteria Met:  [x]  Reduced food intake or assimilation ( [x]  <50% of EEN >1 week or  []   Any reduction >2  weeks) *suspected   [x]  Inflammation (Specify): albumin 1.7 L    Estimated Daily Nutrition Needs:    81.6 kg   -- []    Admit  [x]  Actual  []  Ideal   2285 - 2693 kcals (28 - 33 kcal/kg)   106 - 122 g protein (1.3 - 1.5 g/kg)   1632 mL fluid (20 ml/kg or per MD)     Assessment of Current MNT: Current NPO order is clinically appropriate, but diet should be advanced as soon as medically feasible to meet nutrition needs and decrease risk of malnutrition.      NUTRITION DIAGNOSIS:     Increased nutrient needs (protein/kcal) related to metabolic demands as evidenced by wounds POA, continues    NUTRITION INTERVENTION / RECOMMENDATIONS:     When medically feasible, recommend CCHO5 + 2 g Na diet  Resume Glucerna BID (220 kcals, 10 g protein each) upon diet adavnce. Monitor for need to change to Nepro.   Resume Juven BID (provides 95 kcals, 2.5 g collagen protein, 300 mg vitamin C, 9.5 mg zinc each) upon diet advance.   Monitor lytes & replete/restrict prn  Glucomander per protocol for BG control prn   Strict I/Os  Daily wts to trend   Consider multivitamin  Discharge recommendations: Anticipate CCHO as needed, Juven 14 days or until wounds healed    NUTRITION MONITORING AND EVALUATION:     Nutrition Level of Care:   moderate    Progress Towards Nutrition Goals: []   Met/Ongoing     []   Progressing Appropriately       [x]   Progressing Slowly     []   Not Progressing    Monitoring and Evaluation: PO intake, wt trends, nutrition related labs, skin integrity, and if applicable, supplement acceptance and/or nutrition support tolerance.     Nutrition Goals:  Pt to meet/tolerate >/= 75% of estimated needs, weight maintenance throughout LOS, BG control <180, Improvement and/or maintenance of skin integrity and nutrition related labs.     Code Status: Full Code      , RD   04/30/2024  Office: [phone]

## 2024-04-30 NOTE — Progress Notes (Signed)
 TRANSFER - OUT REPORT:  ?  Verbal report given to Garrel  on pt being transferred to 5212 for routine progression of care.  ?  Report consisted of patient's Situation, Background, Assessment and   Recommendations(SBAR).   ?  Information from the following report(s) Snapshot was reviewed with the receiving nurse.  Opportunity for questions and clarification was provided.       Right groin site CDI w/out hematoma     All belongings transferred with pt.

## 2024-05-01 ENCOUNTER — Inpatient Hospital Stay: Admit: 2024-05-01 | Payer: Medicare (Managed Care) | Primary: Family Medicine

## 2024-05-01 LAB — CBC WITH AUTO DIFFERENTIAL
Basophils: 0.8 % (ref 0–3)
Eosinophils: 1.2 % (ref 0–5)
Hematocrit: 24.6 % — ABNORMAL LOW (ref 35.0–47.0)
Hemoglobin: 8 g/dL — ABNORMAL LOW (ref 11.0–16.0)
Immature Granulocytes %: 0.7 % (ref 0.0–3.0)
Lymphocytes: 13 % — ABNORMAL LOW (ref 28–48)
MCH: 29.4 pg (ref 25.4–34.6)
MCHC: 32.5 g/dL (ref 30.0–36.0)
MCV: 90.4 fL (ref 80.0–98.0)
MPV: 9.4 fL (ref 6.0–10.0)
Monocytes: 10.1 % (ref 1–13)
Neutrophils Segmented: 74.2 % — ABNORMAL HIGH (ref 34–64)
Nucleated RBCs: 0 (ref 0–0)
Platelets: 519 1000/mm3 — ABNORMAL HIGH (ref 140–450)
RBC: 2.72 M/uL — ABNORMAL LOW (ref 3.60–5.20)
RDW: 53.3 — ABNORMAL HIGH (ref 36.4–46.3)
WBC: 9.2 1000/mm3 (ref 4.0–11.0)

## 2024-05-01 LAB — RENAL FUNCTION PANEL
Albumin: 1.8 g/dL — ABNORMAL LOW (ref 3.4–5.0)
Anion Gap: 8 mmol/L (ref 5–15)
BUN: 10 mg/dL (ref 9–23)
CO2: 30 meq/L (ref 20–31)
Calcium: 7.8 mg/dL — ABNORMAL LOW (ref 8.7–10.4)
Chloride: 100 meq/L (ref 98–107)
Creatinine: 0.78 mg/dL (ref 0.55–1.02)
GFR African American: >60
GFR Non-African American: >60
Glucose: 85 mg/dL (ref 74–106)
Phosphorus: 3.7 mg/dL (ref 2.4–5.1)
Potassium: 4 meq/L (ref 3.5–5.1)
Sodium: 137 meq/L (ref 136–145)

## 2024-05-01 MED ORDER — IOPAMIDOL 61 % IV SOLN
61 | Freq: Once | INTRAVENOUS | Status: AC
Start: 2024-05-01 — End: 2024-05-01
  Administered 2024-05-01: 14:00:00 5 mL

## 2024-05-01 MED ORDER — LIDOCAINE HCL (PF) 1 % IJ SOLN
1 | Freq: Once | INTRAMUSCULAR | Status: AC
Start: 2024-05-01 — End: 2024-05-01
  Administered 2024-05-01: 14:00:00 10 mL via INTRADERMAL

## 2024-05-01 MED FILL — OXYCODONE-ACETAMINOPHEN 7.5-325 MG PO TABS: 7.5-325 mg | ORAL | Qty: 1 | Fill #0

## 2024-05-01 MED FILL — METRONIDAZOLE 500 MG/100ML IV SOLN: 500 MG/100ML | INTRAVENOUS | Qty: 100 | Fill #0

## 2024-05-01 MED FILL — HEPARIN SODIUM (PORCINE) 5000 UNIT/ML IJ SOLN: 5000 [IU]/mL | INTRAMUSCULAR | Qty: 1 | Fill #0

## 2024-05-01 MED FILL — ISOVUE-300 61 % IV SOLN: 61 % | INTRAVENOUS | Qty: 60 | Fill #0

## 2024-05-01 MED FILL — CEFEPIME HCL 1 G IJ SOLR: 1 g | INTRAMUSCULAR | Qty: 1000 | Fill #0

## 2024-05-01 MED FILL — CLOPIDOGREL BISULFATE 75 MG PO TABS: 75 mg | ORAL | Qty: 1 | Fill #0

## 2024-05-01 MED FILL — VALSARTAN 40 MG PO TABS: 40 mg | ORAL | Qty: 4 | Fill #0

## 2024-05-01 MED FILL — SODIUM-POTASSIUM-PHOSPHORUS 160-280-250 MG PO PACK: 160-280-250 mg | ORAL | Qty: 1 | Fill #0

## 2024-05-01 MED FILL — MORPHINE SULFATE 2 MG/ML IJ SOLN: 2 mg/mL | INTRAMUSCULAR | Qty: 1 | Fill #0

## 2024-05-01 MED FILL — CARVEDILOL 6.25 MG PO TABS: 6.25 mg | ORAL | Qty: 2 | Fill #0

## 2024-05-01 MED FILL — AMLODIPINE BESYLATE 5 MG PO TABS: 5 mg | ORAL | Qty: 1 | Fill #0

## 2024-05-01 MED FILL — ASPIRIN LOW DOSE 81 MG PO TBEC: 81 mg | ORAL | Qty: 1 | Fill #0

## 2024-05-01 NOTE — Progress Notes (Signed)
 Subjective:     Her daughter reports that vascular surgeon Dr. Maree told her: For plans left-sided angiogram after left foot abscess I&D.  No left foot abscess I& D was done by the podiatry.  Status post angiogram on the left leg.  Intolerance left MD needle aspiration  Assessment/Plan:     Patient seen for complicated medical problems as listed below :    Right foot abscess /osteomyelitis   7/17 s/p OR with I&D and placement of wound VAC on right foot-OR cultures negative so far  7/19 CT still showing multiple abscesses in the right midfoot and subcu area  Status post right angiogram on 04/21/24, discussed with vascular surgery.  wound VAC removed, tissue clean, good bleeding.  Path : Acute and chronic osteomyelitis   ID consult appreciated  6-week cefepime  plus ~4-week metronidazoles (     Left foot abscess,   CT showed bilateral foot abscess. Left foot abscess  status post I&D by podiatry and left side angiogram on July 30    PAD bilateral lower extremities status post bilateral angiogram, Balloon angioplasty left SFA with stent x2, 5x40 distal SFA and 6x40 mid-SFA    Chronic hypoxic resp failure continue nasal cannula oxygen maintain O2 sat greater than 90%  COPD continue bronchodilator with butyryl and Atrovent  Possible pneumonia on antibiotics  7.   Left knee fluid collection on CTA with some fluctuance and pain on exam.  Podiatry consult IR evaluation,  tried aspiration but unsuccessful due to the patient intolerance       Continue home regimen for otherwise chronic, stable medical conditions as noted above.   Her daughter reports that vascular surgeon Dr. Maree told her: For plans left-sided angiogram after left foot abscess I&D.  Now left foot abscess ID was done by the podiatry.  I spoke to vascular surgery PA and discussed the above issue.  She will discuss with vascular attending.     Reason for continued hospitalization: Follow         Objective:         Vitals:    04/30/24 1935 04/30/24 2231  05/01/24 0529 05/01/24 0754   BP: (!) 143/70 138/65 (!) 140/62 (!) 140/60   Pulse: 73 75 70 81   Resp: 17 16 19 21    Temp: 98.4 F (36.9 C) 97.8 F (36.6 C) 99.3 F (37.4 C) 99.7 F (37.6 C)   TempSrc: Temporal Temporal Temporal Temporal   SpO2: 92% 94% 91% 95%   Weight:       Height:       EXAM:  GENERAL: in mild distress.   RESPIRATORY: Bilateral BS present. No rales or rhonchi.   CARDIOVASCULAR: S1 and S2 present, regular. No murmur  ABDOMEN: Soft and nontender with positive bowel sounds.   NEUROLOGICAL: Cranial nerves II through XII grossly intact. No focal weakness  Extremities, bilateral lower extremities wound. Vac on right side .     Recent Labs     05/01/24  0535   WBC 9.2   HGB 8.0*   HCT 24.6*   MCV 90.4   PLT 519*     Recent Labs     05/01/24  0535   NA 137   K 4.0   CL 100   CO2 30   ANIONGAP 8   CALCIUM  7.8*   BUN 10   CREATININE 0.78   GLUCOSE 85     No results for input(s): LABALBU, TP, AST, ALKPHOS, BILITOT, BILIDIR, ALT, GLOB in the  last 72 hours.    Invalid input(s): AGRAT  Lab Results   Component Value Date/Time    COLORU Yellow 04/15/2024 03:02 AM    CLARITYU Clear 04/15/2024 03:02 AM    GLUCOSEU Negative 04/15/2024 03:02 AM    BILIRUBINUR Negative 04/15/2024 03:02 AM    KETUA Negative 04/15/2024 03:02 AM    BLOODU Trace-lysed 04/15/2024 03:02 AM    PHUR 5.5 04/15/2024 03:02 AM    NITRU Negative 04/15/2024 03:02 AM    LEUKOCYTESUR Trace 04/15/2024 03:02 AM     No results for input(s): POCGLU in the last 72 hours.        Discussed with patient and family regarding management, prognosis, treatment and complications of the patient's medical conditions in detail. All questions answered  to the satisfaction of those individual(s) who also verbalized understanding of and agreement with the assessment and plan.     Dr. [place], Garnette RAMAN, MD, thank you for allowing us  to participate in the care of this patient.  Please contact me through the hospital if questions arise.  Dragon  medical dictation software was used for portions of this report. Unintended errors may occur.     Sergio callander MD  [facility]  [phone]

## 2024-05-01 NOTE — Progress Notes (Signed)
 PHYSICAL THERAPY TREATMENT   Time  PT Charge Capture  Rehab Caseload Tracker   [place] AM-PAC 6 Clicks Basic Mobility Inpatient Short Form  -      Patient: Susan [name] (73 y.o. female)  Room: 5212/5212    Primary Diagnosis: Foot abscess [L02.619]  Right leg pain [M79.604]  Abscess of right foot [L02.611]   Procedure(s) (LRB):  DEBRIDEMENT RIGHT FOOT WITH GRAFT APPLICATION; APPLICATION OF WOUND VAC;  DRAINAGE OF ABSCESS LEFT FOOT (Bilateral) 6 Days Post-Op  Date of Admission: 04/14/2024   Length of Stay:  16 day(s)  Insurance: Payor: BCBS MEDICARE / Plan: ANTHEM FULL DUAL ADVANTAGE 2 / Product Type: *No Product type* /      Date: 05/01/2024  Time In: 1520       Time Out: 1543   Total Minutes: 23      Isolation:  No active isolations       MDRO: No active infections  Current diet order: ADULT DIET; Regular  ADULT ORAL NUTRITION SUPPLEMENT; Breakfast, Lunch, Dinner; Wound Healing Oral Supplement    Precautions: falls   Ordered weight bearing status: Limit wt. bearing to bilateral LE in surgical shoe with assistance of DME       ASSESSMENT:      Based on the objective data described below, the patient presents with  - generalized muscle weakness affecting function  - active participation in therapeutic exercises  - complaint of B LE pain  - Pt require cga in bed mobility  - Pt participated well with her LE exs.   - demo fair long sitting balance  - Pt decline to sit on EOB  - Pt demo generalized weakness affecting functional mobility status  Patient's rehabilitation potential for below stated goals: good.    Recommendations:  Recommend continued physical therapy during acute stay. Physical Therapy. Recommend out of bed activity to counteract ill effects of bedrest, with assistance from staff as needed.  Discharge Recommendations: Skilled nursing facility (SNF): patient will benefit from further therapy at rehab facility to increase strength and endurance to return to prior level of function.  Further  Equipment Recommendations for Discharge: DME to be determined at time of discharge from SNF.           PLAN:      Patient will benefit from skilled Physical Therapy intervention to address the above impairments to return to prior level of function. Patient will be seen 3-5 times/week for 1 week.    Patient will achieve PT goals in 1 week.       PLANNED INTERVENTIONS:      Skilled Physical Therapy services will provide functional mobility training, therapeutic exercises, therapeutic activities, patient/caregiver education as indicated.  Skilled Physical Therapy services will modify and progress therapeutic interventions, address functional mobility deficits, address ROM and strength deficits, analyze and cue movement patterns and assess and modify postural abnormalities to reach the stated goals.    COMMUNICATION/EDUCATION:     Education: Ill effects of bedrest, benefits of activity, HEP, role of PT, PT plan of care.    Education provided to: patient  Opportunity for questions and clarification was provided.    Readiness to learn indicated by: verbalized understanding    Barriers to learning/limitations: none    Comprehension: Patient communicated comprehension      SUBJECTIVE:      Patient agreed to PT c/o pain on R Achilles upon  dorsiflexion   Patient reports 5/10 pain before treatment and 5/10 pain at conclusion  of treatment.  Pain Location: B feet    OBJECTIVE DATA SUMMARY:      Orders, labs, and chart reviewed on Richel C Wecker.     Present illness history:   Patient Active Problem List    Diagnosis Date Noted    Foot abscess 04/15/2024    OSA (obstructive sleep apnea)     Dyslipidemia     Asthma     Psychiatric disorder     Chronic obstructive pulmonary disease (HCC)     Bipolar 1 disorder (HCC)     Peripheral neuropathy 06/19/2013    Hypertension 06/19/2013      Previous medical history:   Past Medical History:   Diagnosis Date    Hyperlipidemia     Hypertension         PATIENT FOUND:     Semi reclined in  bed. (+) bed alarm.    COGNITIVE STATUS:     Mental Status:  Oriented x3   Communication:  normal   Follows commands:  follows two step commands/direction   General cognition:  intact   Safety/Judgement:  appropriate awareness of environment and need for assistance       EXTREMITIES ASSESSMENT:      Range Of Motion:    BLE: limited d/t pain    Strength:      BLE: Strength is grossly graded as 2+ /5      Tone  normal      THERAPEUTIC ACTIVITIES; FUNCTIONAL MOBILITY AND BALANCE STATUS:     Patient received/participated in Therapeutic Activities (10 minutes)  and Therapeutic Exercises (13 minutes)    Bed mobility:  Rolling: contact guard; with bed flat;   Sup->sit: contact guard; via long sitting  Sit->supine:  contact guard; ;     Transfers:  Sit - stand: unable;    Stand - sit: not tested;         Gait Training:  Not tested         Balance:   Static sitting balance:          good     via long sitting  Dynamic sitting balance:     good       Static standing balance:      not tested      Dynamic standing balance: not tested        THERAPEUTIC EXERCISES:      2 x 10 reps, gentle AA/AROM BLE:  ankle pumps, heel slides, hip ab/duction, straight leg raise, glute sets, quad sets    NEUROMUSCULAR RE-EDUCATION:       Balance activities:   Worked to improve reactive postural responses: stable surface, sitting.   Required manual cues for safety and technique.     ACTIVITY TOLERANCE:     - activity is limited by pain    FINAL LOCATION:     Positioned in bed, all needs within reach. Patient agrees to call for assistance. Head of bed elevated. As found. (+) bed alarm.    Thank you for this referral.  Saqib Cazarez C. Per Beagley, PT

## 2024-05-01 NOTE — Op Note (Unsigned)
 [facility]  Inpatient Operation Report  NAME:  Susan [name], Susan [name]  SEX:   F  DATE: 05/01/2024  DOB: 1950/11/30  MR#    [id number]  ROOM:  5212  ACCT#  [id number]    cc: Hezzie Barges MD      PREOPERATIVE DIAGNOSIS:  Left proximal tibia abscess.     POSTOPERATIVE DIAGNOSIS:  Left proximal tibia abscess.     PROCEDURE:  Bedside incision and drainage/aspiration of abscess.     SURGEON:  Hezzie Barges, M.D.     BRIEF HISTORY:  The patient is a 73 year old female with left lower extremity pain and fluctuance over the left proximal tibia.     DESCRIPTION OF PROCEDURE:  After bedside analgesia with 1% lidocaine  over the anterolateral tibia using a 25-gauge needle, the left pre-knee joint abscess was aspirated. Thirty milliliters (30 mL) of pus was removed. I then opened the incision 4 mm and purulent material was removed.      The patient tolerated the procedure well. Dry sterile dressing was applied. Aerobic and anaerobic cultures were sent.         ___________________  Hezzie CHRISTELLA Barges MD   Dictated Ab:Qzopk EMERSON Barges, MD  SB  D: 05/01/2024 15:25:46  T: 05/01/2024 16:11:03  [id number]

## 2024-05-01 NOTE — Progress Notes (Signed)
 [facility]ASC[facility] SPECIALISTS  PROGRESS NOTE  [name], PA-C      [name], 73 y.o. female  MRN [id number]  [facility]    Date: 05/01/24   Post-Op Day: 1 s/p LLE angio with SFA atherectomy and balloon angioplasty left SFA with stent x2, 5x40 distal SFA and 6x40 mid-SFA;   POD 10 s/p RLE angio with sfa and ak pop orbital atherectomy, R SFA and AK pop balloon angioplasty   Assessment/Plan   73 y.o. female with known atherosclerosis of native arteries of bilateral lower extremities with wounds on bilateral feet s/p debridement by podiatry     Bilateral feet with bulky dressing in place  R groin access site c/d  L foot warm, motor/sensation intact  Continue DAPT and statin  Attempted to reach daughter by phone, no answer, voicemail left  No barriers to discharge from vascular perspective    Subjective:   73 y.o. female is sore and upset with her knee aspiration procedure    Objective:   Admit weight: Weight - Scale: 81.6 kg (180 lb)  Last recorded weight: Weight - Scale: 81.6 kg (179 lb 14.3 oz)    Temp (24hrs), Avg:98.8 F (37.1 C), Min:97.8 F (36.6 C), Max:99.7 F (37.6 C)    Body mass index is 28.18 kg/m.    Intake/Output Summary (Last 24 hours) at 05/01/2024 1248  Last data filed at 05/01/2024 0529  Gross per 24 hour   Intake 150 ml   Output 300 ml   Net -150 ml     BP (!) 140/60   Pulse 81   Temp 99.7 F (37.6 C) (Temporal)   Resp 21   Ht 1.702 m (5' 7)   Wt 81.6 kg (179 lb 14.3 oz)   SpO2 95%   BMI 28.18 kg/m     General: Awake, alert, and oriented.   HEENT: Normocephalic and atraumatic   Neck: Trachea midline.  No JVD.   Heart: Regular rate  Regular Rhythm  Lungs: Symmetrical chest expansion. Normal effort   Abdomen: Soft, non-tender  Extremities: No cyanosis or edema. LLE warm  Neuro: Alert and oriented x3  Follows basic commands. UE and LE motor and sensory function intact.    Skin: Warm and dry, R groin access site c/d, bilateral feet with large bulky dressings in  place      Labs:  Complete Blood Count with Differential  Lab Results   Component Value Date/Time    WBC 9.2 05/01/2024 05:35 AM    RBC 2.72 (L) 05/01/2024 05:35 AM    HCT 24.6 (L) 05/01/2024 05:35 AM    MCV 90.4 05/01/2024 05:35 AM    MCH 29.4 05/01/2024 05:35 AM    MCHC 32.5 05/01/2024 05:35 AM    RDW 53.3 (H) 05/01/2024 05:35 AM    MPV 9.4 05/01/2024 05:35 AM    MONOS 10.1 05/01/2024 05:35 AM    BASOS 0.8 05/01/2024 05:35 AM     Basic Metabolic Profile  Lab Results   Component Value Date    NA 137 05/01/2024    CO2 30 05/01/2024    BUN 10 05/01/2024    GLUCOSE 85 05/01/2024    CALCIUM  7.8 (L) 05/01/2024     Hepatic Function Panel  Lab Results   Component Value Date    ALBUMIN 1.8 (L) 05/01/2024    ALKPHOS 82 04/16/2024     Microbiology  Results       Procedure Component Value Units Date/Time  Culture, Body Fluid (with Gram Stain) [[id number]]     Order Status: No result Specimen: Body Fluid from Synovial Fluid     Culture with Smear, Acid Fast Bacillius [[id number]] Collected: 04/28/24 0845    Order Status: Completed Specimen: Other Updated: 04/29/24 1244     Acid Fast Smear       No Acid Fast Bacilli Seen On Concentrated Smear by Fluorochrome stain           Culture Result       Culture In Progress, Weekly Updates To Follow          Narrative:      Add to 7/25 OR culture, discussed with micro 7/28    Culture, Fungus [[id number]] Collected: 04/28/24 0845    Order Status: Completed Specimen: Foot, Left Updated: 04/28/24 1219     Microscopic Observation --        Rare Epithelial Cells  Rare WBC'S  No Organisms Seen  No Fungal Elements Seen       Culture Result       Culture In Progress, Weekly Updates To Follow          Narrative:      Add to 7/25 OR culture, discussed with micro 7/28    Culture, Anaerobic [[id number]] Collected: 04/25/24 1539    Order Status: Completed Specimen: Swab from Foot Updated: 04/30/24 0858     Culture Result       No Anaerobes Isolated After 5 Days          Narrative:      LEFT FOOT  ABCESS    Culture, Wound W Gram Stain [[id number]] Collected: 04/25/24 1539    Order Status: Completed Specimen: Swab from Foot Updated: 04/30/24 1032     Gram Stain Result Rare WBC'S  No Organisms Seen        Culture Result No Growth After 5 Days       Narrative:      LEFT FOOT ABCESS    Culture, Anaerobic [[id number]] Collected: 04/17/24 1252    Order Status: Completed Specimen: Swab from Foot Updated: 04/22/24 0854     Culture Result       No Anaerobes Isolated After 5 Days          Narrative:      Right foot wound    Culture, Anaerobic [[id number]] Collected: 04/17/24 1252    Order Status: Completed Specimen: Bone from Foot Updated: 04/22/24 0855     Culture Result       No Anaerobes Isolated After 5 Days          Narrative:      Right Heel bone    Culture, Wound W Gram Stain [[id number]] Collected: 04/17/24 1252    Order Status: Completed Specimen: Swab from Foot Updated: 04/20/24 1330     Gram Stain Result Rare WBC'S  No Organisms Seen        Culture Result No Growth       Narrative:      Right foot wound    Culture, Surgical Wound with Gram Stain  [[id number]] Collected: 04/17/24 1252    Order Status: Completed Specimen: Bone from Foot Updated: 04/20/24 1342     Gram Stain Result Rare WBC'S  No Organisms Seen        Culture Result No Growth       Narrative:      Right Heel bone              Waddell  MARLA Prince, PA-C  05/02/2511:48 PM

## 2024-05-01 NOTE — Progress Notes (Signed)
 Chaplain Services  Unable to Visit Patient    Start Time: 2047  End Time: 2052    Chaplain attempted to conduct a Consultation and Spiritual Assessment for Susan [name], who is a 72 y.o.,female.  According to the patient's EMR Religious Affiliation is: None.       Patient is with staff, and is not available to be assessed at this time.  No Family present.  Offered prayer remotely on patient's behalf.     Assessment:  Patient has no known religious/cultural needs that will affect patient's preferences in health care.  Patient has no known spiritual or religious issues which require intervention at this time.     Plan:  Chaplains will continue to follow and will provide pastoral care on an as needed/requested basis.  Chaplain recommends bedside caregivers page Duty Chaplain if patient shows signs of acute spiritual or emotional distress.     Hurshel GORMAN Luana Elia  Spiritual Care  [phone]

## 2024-05-01 NOTE — Wound Image (Signed)
 Situation: Wound/Ostomy Consult    Background: Braden score - 16/23. According to the medical record, patient has a PMHx of basal artery aneurysm, peripheral artery disease, schizoaffective disorder, hypertension, depression, peripheral neuropathy, COPD, chronic respiratory failure and uses 2 L home oxygen. Patient admitted with foot abscess. On 04/17/24, patient had right heel irrigation and debridement with wound vac placement. On 04/25/24, patient had debridement of right foot with graft application and wound vac; drainage of abscess of left foot. See H&P/MD notes for full patient history.    Assessment: Patient seen in room #: 5212. Patient sitting up in bed alert and oriented. Right foot with 1+ edema. NPWT dressing intact to right lateral foot wound. There is 100cc serosanguinous drainage in the canister. Dressing removed. Adaptic dressing and some staples came off with dressing removal. Full thickness wound measures 6.5 x 8 x 0.5 cm. Wound base is covered with skin graft, which is stapled to the wound. Periwound is macerated. Wound cleansed and NPWT applied. Kerlix and ace wrap applied to right foot.     Recommendation:   LOCATION: RIGHT LATERAL FOOT - Adaptic dressing placed over skin graft. Granufoam dressing cut to fit and placed into wound, then bridged to right ankle before connecting to 131mm/Hg continuous neg pressure using KCI wound VAC. No leak noted. Pt tolerated with no complaints of pain. Will continue to follow for dressing changes MWF.    Thank you,      Eleanor Carol, BSN, RN   [facility]   Inpatient Wound, Ostomy, Continence Nurse

## 2024-05-01 NOTE — Consults (Unsigned)
 [facility]  CONSULTATION REPORT  NAME:  Susan [name], Susan [name]  SEX:   F  ADMIT: 04/14/2024  DATE OF CONSULT: 05/01/2024  REFERRING PHYSICIAN:   DOB: Feb 01, 1951  MR#    [id number]  ROOM:  5212  ACCT#  [id number]    cc: Hezzie Barges MD      HISTORY OF PRESENT ILLNESS:  The patient is a 73 year old female seen in consultation today for evaluation of a left lower extremity fluctuance. She apparently was admitted for a presumed infection. She apparently had a procedure by Podiatry yesterday with regards to her left lower extremity. Apparently, radiology attempted an aspiration of the left knee and were not successful.     PAST MEDICAL HISTORY:  Right foot abscess, history of COPD, history of basal artery aneurysm, history of schizoaffective disorder, hypertension, depression, peripheral arterial disease, peripheral neuropathy.     FAMILY HISTORY:  Noncontributory.     REVIEW OF SYSTEMS:  HEENT: No coughs or colds.  CARDIAC: No chest pain.  RESPIRATORY: No shortness of breath.  GASTROINTESTINAL: No nausea, vomiting, diarrhea.  GENITOURINARY: No frequency, urgency or dysuria.  MUSCULOSKELETAL: Complaints of left lower extremity pain.  SKIN AND INTEGUMENT: No rashes,  HEMATOLOGIC/LYMPHATIC: No adenopathy.  NEUROLOGICAL: Schizoaffective disorder.     PHYSICAL EXAMINATION:  GENERAL: Reveals a pleasant 73 year old female in no acute distress.  HEENT: Normal limits.  HEART: Regular rate and rhythm.  LUNGS: Clear.  ABDOMEN: Soft, nontender, nondistended.  NEUROLOGIC: Nonfocal. Upper extremity motor exam, deltoids, biceps, triceps, wrist flexors and extensors, finger flexors, extensors, and hand intrinsics 5/5. Lower extremity exam shows hip flexion and extension, hip abduction, adduction, knee flexion and extension, ankle dorsiflexion, plantarflexion, EHL, and FHL are 5/5.  ORTHOPEDIC: Long bone examination of the upper extremities reveals full range of motion of her shoulders, elbows, wrists, and small joints of the hand.  Pelvis is stable to AP, lateral, and vertical compression. Hips, knees, and ankles show full range of motion. In particular, the left knee has no effusion. No joint line tenderness. No swelling. The patient has full range of motion without pain. There is a pre-knee joint soft tissue fluctuance over the anterolateral side 2 cm below the knee joint. Axial spine, cervical, thoracic and lumbar reveals no dorsal tenderness.     CLINICAL IMPRESSION:  Left lower leg abscess below the knee. This is not intraarticular. This was aspirated at the bedside and incision and drained. This will be put on a separate report.     CLINICAL IMPRESSION:  Left lower extremity abscess pre-knee joint.         ___________________  Hezzie CHRISTELLA Barges MD   Dictated Ab:Qzopk EMERSON Barges, MD  SB  D: 05/01/2024 15:23:02  T: 05/01/2024 83:93:74  [id number]

## 2024-05-01 NOTE — Progress Notes (Signed)
 INFECTIOUS DISEASE FOLLOW UP NOTE     Date of admission: 04/14/2024     Date of consult: April 15, 2024        ABX:      Current abx Prior abx    Cefepime  7/14-17  Flagyl  7/15-16 Vanco 7/14-14      ASSESSMENT:       Right foot abscess  -Unclear cause of swelling and pain that has been present for several months  -Has been evaluated by orthopedic  -Patient seen by [facility] specialist Dr. Curtistine Emperor with last visit 03/11/2024 for right contusion of dorsum of foot, lower extremity edema and right foot pronation deformity  -03/08/2024 MRI right hindfoot no fracture.  Mild multifocal osseous edema?  Altered gait.  And mechanical stress-related changes.  Mild intrinsic foot muscular atrophic and intramuscular edema.  Subcu edema along the dorsum of the foot  -xray no fracture  -S/p I&D by ED, cultures unrevealing  - Patient seen by podiatrist Dr. Roberts, OR I and D done 7/17, cultures unrevealing, path consistent with acute on chronic osteomyelitis     L knee effusion  -Concern for potential abscess on CTA. IR attempted aspiration on 7/31 but pt did not tolerate procedure. Monitor closely for worsening.    Left foot swelling  -Xray no fracture but CT concerning for abscess  - Status post OR I and D 7/25   Leucocytosis POA   AKI/CKD  -Abnormal creatinine few weeks ago on routine blood work at [facility]   PAD  -Patient has been following with vascular surgeon and last seen in office on 04/08/2024  -12/2023 ABI at [facility] with severe left lower extremity arterial insufficiency at rest and right first digital disease with known proximal arterial insufficiency  -12/2023 s/p right lower extremity angiogram showed single-vessel peroneal runoff with good branches   Ch hypoxic resp failure   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    Tmax 99.7, heart rate 81, respiratory 21, BP 140 over 60 O295% on 2 L   labs include creatinine 0.78 from 0.76, WBC 9.2 from 9.0, platelets of 519 from 590     micro 7/25 fungal and AFB left foot smear negative, anaerobic NGTD, routine NG TD; 7/17 wound right foot Gram stain negative, culture NG, ANA NG, right heel bone Gram stain negative, culture negative, ANA NG, 7/15 urine less than 10k, superficial wound culture negative, 7/14 blood culture x 2 negative  Imaging 7/21 IR angio  Path 7/17 right heel acute and chronic osteomyelitis       Assessment     Right foot abscess  - Coming with swelling, pain of right foot   - I&D in ED by provider and cultures negative  - CT concerning for abscess on right foot  - 7/17 s/p OR with I&D and placement of wound VAC -or cultures negative   - Path suggestive of acute and chronic osteomyelitis   - 7/19 CT still showing multiple abscesses in  the midfoot and subcu area  - SP 7/25 debridement right foot with graft application application of wound VAC by Dr. Rosamond  -S/P Angiogram/balloon angioplasty and stent placement of L SFA 04/30/24    L knee effusion  -Concern for potential abscess on CTA. IR attempted aspiration on 7/31 but pt did not tolerate procedure. Monitor closely for worsening.     Left foot swelling  - Tenderness on dorsal aspect  - 7/17 CT suggestive of cellulitis with focal area of collection?  Abscess and CTA 7/19 poorly defined collections foot and subcu left knee  - Covering with broad-spectrum antibiotic  - OR drainage of  abscess left foot on 7/25 with right foot surgery  - Routine and anaerobic culture no growth to date, AFB and fungal added for completeness     PAD  -Previous history of angio but recent ABI with monophasic waveforms in femoral and plan for angio per vascular  -Vascular study 7/16 moderate right iliac-femoral, severe left arterial insufficiency at femoropopliteal region  -s/p angio 7/21 RLE with right SFA intervention     Bilateral leg edema  - Recent PVL 7/8 was negative for DVT but low threshold to repeat     Leukocytosis -normal now     AKI?  CKD  - Avoid nephrotoxic medications and dose for current creatinine clearance  - Renal ultrasound without any stone or obstruction    Other details as above       Rec:  - Continue cefepime  and Flagyl  for now.   - With all cultures this admission to date negative for MRSA and Enterococcus monitor off vancomycin  pending final culture-Unclear duration on antibiotics at this point but right heel path showed osteomyelitis so may need extended course,?  6-week cefepime  plus ~4-week metronidazoles (if no symptoms of neuropathy/toxicity), so far no positive cultures to guide antibiotic selection, patient with penicillin allergy but tolerating current therapy  - Monitor pending cultures from left foot suspected abscess, added AFB and fungal cultures  -Local wound care per others  - Podiatry follow up  - Left knee fluid collection on CTA with some fluctuance and pain on exam.  IR attempted to aspirate fluid 7/31 but patient did not tolerate the procedure.  Will need to monitor the knee closely.  - Keep legs elevated on pillows  - Monitor serial labs  - Monitor for any ADE      Ann Olden, DO  Infectious Disease  [facility]      MICROBIOLOGY:      7/14 Blcx x2 NG              Wd cx NG  7/15 Ucx less than 10K UG F  7/17 anaerobic x 2 NG wound NOS, NG surgical wound NG right heel  7/25 anaerobic no growth to date  Routine Gram stain rare WBC NOS, NGTD  AFB  IP  Fungal IP     LINES AND CATHETERS:      PIV     SUMMARY:      Susan [name] is a 73 year old  female with past medical history of COPD, schizoaffective disorder, hypertension, depression, PAD, peripheral neuropathy was admitted on 7/14 with worsening leg pain.     Patient is not a good historian and says lives with her daughter.  She has been almost bedbound for quite some time. Pt has been following with [facility] for basilar artery aneurysm and conservative Mx.  Patient also developed right leg swelling for which she was referred  to Ortho and eventually podiatry.  It was not thought to be the part of her back leading to right leg swelling and neuropathy.  Patient also following with vascular and had procedure done in March with good runoff and did not think was concern for pain and swelling.  Patient last seen by podiatry 03/11/2024 and plan for conservative management.      She was seen in ED on 8 July with leg swelling.  Workup showed negative PVL for DVT and chest x-ray without any significant finding.  She was noted with pitting edema and was given some Lasix  and asked to follow-up with PCP for adjustment in medications.  Patient returned 7/14 with worsening swelling of the legs, right worse than left.  Workup showed stable vitals, afebrile state.  WBC elevated at 19, Hb 9.8, PLT 354, BUN 34, CR 1.43, AST 17, ALT 14.  Blood cultures were obtained.  Patient had bilateral feet x-ray which showed no fracture or concern for osteo.  Chest x-ray was suggestive of left basilar opacity with developing pneumonia versus atelectasis.  Patient had I&D in the ED.  Podiatry was consulted and CT was ordered.  Wound culture in progress.  Patient on broad-spectrum antibiotic with cefepime  and Vanco.  ID was asked for further evaluation management.       SUBJECTIVE :     Interval notes reviewed. Afebrile overnight.  Went for attempted IR aspiration of the knee, did not  tolerate the procedure.  Went for angiogram yesterday with  balloon angioplasty and stent placement in the SFA.  OBJECTIVE     BP (!) 140/60   Pulse 81   Temp 99.7 F (37.6 C) (Temporal)   Resp 21   Ht 1.702 m (5' 7)   Wt 81.6 kg (179 lb 14.3 oz)   SpO2 95%   BMI 28.18 kg/m     Temp (24hrs), Avg:98.9 F (37.2 C), Min:97.8 F (36.6 C), Max:99.7 F (37.6 C)    General: Fairly developed and nourished 73 y.o. year-old, female, chronically sick  HEENT: Normocephalic, anicteric sclerae   Neck: Supple   Chest: Symmetrical expansion  Abdomen: Soft, non-tender,non distended, no organomegaly, BS+  Musculoskeletal: Right foot wound VAC  left foot postop dressing clean dry intact not removed; L  knee with subpatellar effusion, TTP  SKIN: No obvious skin lesion or rash. Dry, warm, intact     MEDICATIONS:     Current Facility-Administered Medications   Medication Dose Route Frequency Provider Last Rate Last Admin    valsartan  (DIOVAN ) tablet 160 mg  160 mg Oral BID Josepha Planas, DO   160 mg at 05/01/24 9160    amLODIPine  (NORVASC ) tablet 5 mg  5 mg Oral Daily Josepha Planas, DO   5 mg at 05/01/24 9160    carvedilol  (COREG ) tablet 12.5 mg  12.5 mg Oral BID WC Josepha Planas, DO   12.5 mg at 05/01/24 0839    potassium & sodium phosphates  (PHOS-NAK) 280-160-250 MG packet 250 mg  1 packet Oral 4x Daily Josepha Planas, DO   250 mg at 05/01/24 0839    lidocaine  PF 1 % injection 1 mL  1 mL IntraDERmal Once Vyas, Shruti, DPM        dextrose  50 % IV solution  10-50 mL IntraVENous PRN Josepha Planas, DO   50 mL at 04/25/24 1000    glucagon  injection 1 mg  1 mg IntraMUSCular PRN Josepha Planas, DO        cefepime  (MAXIPIME ) 1,000 mg in  sodium chloride  0.9 % 50 mL IVPB (mini-bag)  1,000 mg IntraVENous Q8H Matriano, Norleen Deward DEL, MD   Stopped at 05/01/24 0840    atorvastatin  (LIPITOR ) tablet 40 mg  40 mg Oral Nightly Maree Ladona HERO, MD   40 mg at 04/30/24 2013    aspirin  EC tablet 81 mg  81 mg Oral Daily Maree Ladona HERO, MD   81 mg at 05/01/24 1048    clopidogrel  (PLAVIX ) tablet 75 mg  75 mg Oral Daily  Maree Ladona HERO, MD   75 mg at 05/01/24 1048    ipratropium 0.5 mg-albuterol  2.5 mg (DUONEB ) nebulizer solution 1 Dose  1 Dose Inhalation Q4H PRN Rosslyn Scrape, MD        senna (SENOKOT) tablet 8.6 mg  1 tablet Oral Daily PRN Matriano, Norleen Deward DEL, MD        docusate sodium  (COLACE) capsule 100 mg  100 mg Oral BID PRN Murrell Norleen Deward DEL, MD        ondansetron  (ZOFRAN ) injection 4 mg  4 mg IntraVENous Q6H PRN Murrell Norleen Deward DEL, MD   4 mg at 04/21/24 1544    acetaminophen  (TYLENOL ) tablet 650 mg  650 mg Oral Q6H PRN Murrell Norleen Deward DEL, MD   650 mg at 04/28/24 2100    melatonin tablet 3 mg  3 mg Oral Nightly PRN Murrell Norleen Deward DEL, MD        naloxone  (NARCAN ) injection 0.4 mg  0.4 mg IntraVENous PRN Murrell Norleen Deward DEL, MD        heparin  (porcine) injection 5,000 Units  5,000 Units SubCUTAneous BID Rosslyn Scrape, MD   5,000 Units at 05/01/24 1048    benzonatate  (TESSALON ) capsule 100 mg  100 mg Oral TID PRN Murrell Norleen Deward DEL, MD        morphine  (PF) injection 1 mg  1 mg IntraVENous Q3H PRN Murrell Norleen Deward DEL, MD   1 mg at 04/30/24 9352    sodium hypochlorite (DAKINS) 0.125 % external solution   Irrigation Daily Anastasio Adine LABOR, DPM   Given at 04/29/24 0920    metroNIDAZOLE  (FLAGYL ) 500 mg in 0.9% NaCl 100 mL IVPB premix  500 mg IntraVENous Q8H Meade Viviane RAMAN, MD 100 mL/hr at 05/01/24 1049 500 mg at 05/01/24 1049    oxyCODONE -acetaminophen  (PERCOCET ) 7.5-325 MG per tablet 1 tablet  1 tablet Oral Q4H PRN Josepha Planas, DO   1 tablet at 05/01/24 1047         Labs: Results:   Chemistry Recent Labs     04/29/24  0954 04/30/24  0646 05/01/24  0535   NA 138 137 137   K 4.5 4.1 4.0   CL 98 99 100   CO2 28 30 30    BUN 12 10 10       CBC w/Diff Recent Labs     04/29/24  0954 04/30/24  0646 05/01/24  0535   WBC 9.1 9.0 9.2   RBC 3.18* 2.82* 2.72*   HGB 9.3* 8.2* 8.0*   HCT 29.8* 25.8* 24.6*   PLT 476* 590* 519*          RADIOLOGY :        Imaging  XR KNEE ARTHROGRAM LEFT S&I  Result Date:  05/01/2024  INDICATION: left knee effusion  R/O septic knee PROCEDURE: FL KNEE ARTHROGRAM LEFT SANDI TECHNIQUE: The left knee was prepped and draped and locally infiltrated with 1% lidocaine . Despite numerous attempts of the joint space was not  accessed primarily because the patient was intolerant.. COMPARISON: None FINDINGS: Unable to access the joint space. Primarily due to the intolerance of the patient.     IMPRESSION: Unable to access the joint space primarily due to the patient's intolerance to the procedure.. Electronically signed by: Norleen Netters, MD 05/01/2024 10:31 AM EDT          Workstation ID: [id number]     CTA ABDOMINAL AORTA W BILAT RUNOFF W WO CONTRAST  Addendum Date: 04/24/2024  HISTORY: peripheral artery disease Technique: Noncontrast CT imaging is acquired through the chest for bolus tracking purposes. Enhanced CT imaging through the abdomen, pelvis, and lower extremities is performed during arterial phase of IV contrast menstruation as part of a CTA runoff. Angiographic reconstructions are performed formed at a separate workstation. The patient received an uneventful IV bolus of 100 mL of Omnipaque 350 nonionic contrast as part of the study. 3D angiographic post processing and image rendering was performed by the CT technologist at a separate workstation including coronal and sagittal maximal intensity projection (MIP) reconstructions. COMPARISON STUDY: none FINDINGS: Vascular structures and lower extremities: Abdominal aorta is normal in caliber and enhancement. Celiac, SMA, and renal arteries are moderately calcified yet patent with no high-grade stenosis. IMA is patent. Mild narrowing of the left common iliac artery. The right common iliac and external iliac arteries are patent. Hypogastric arteries are patent. On the right, common femoral and profunda are patent. Diffuse mild disease of the right SFA with moderate grade narrowing in the mid SFA just proximal to the patent stent. The right  popliteal artery is patent. Single-vessel runoff to the right foot through the peroneal artery. There is gradual diminished enhancement in the anterior and posterior tibial arteries. The ankle. Ulcerations are present in the right foot with subcutaneous gas and a vacuum. 2 cm abscess in the plantar subcutaneous tissues with an air-fluid level noted. Additional 2.4 cm abscess in the dorsal subcutaneous tissues of the mid foot noted with an air-fluid level. On the left, common femoral artery is mildly narrowed secondary to plaque and the profunda is patent. Diffuse moderate disease of the left SFA with partially calcified plaque. Popliteal artery is patent. Moderate grade stenosis at the proximal posterior tibial artery. Two-vessel runoff via the posterior tibial and peroneal arteries. Diminished enhancement noted in the distal anterior tibial artery. Along the lateral aspect of the midfoot there is a 2.5 x 4.6 cm area of low density which may represent a developing abscess or phlegmon. There is also a region of edema in the anterior left lower leg at the level of the ankle. At the level of the knee, there is a irregular shaped fluid collection in the anterolateral knee just lateral to the tibial tuberosity which measures approximately 4.2 cm in diameter. Bibasilar subsegmental atelectasis. Heart size is borderline enlarged. The liver has a slight nodular contour. The gallbladder is present and contains excreted contrast. The spleen, pancreas, and adrenal glands are within normal notes. 2.6 cm left renal cyst. Kidneys are otherwise normal in morphology with no hydronephrosis. Appendix and small bowel loops are unremarkable. Diverticulosis is noted throughout the colon. No free intraperitoneal fluid or air. Uterus contains a coarsely calcified leiomyoma. No adnexal or pelvic mass. Urinary bladder is unremarkable. Osseous structures the pelvis and lumbar spine are intact. No compression deformities. IMPRESSION: 1. No  aortic inflow stenosis. 2. Moderate grade stenosis of the mid right SFA and patent SFA stent. Patent right popliteal artery with single vessel runoff to the right  foot. 3. Diffuse moderate disease of the left SFA with patent popliteal artery and diseased two-vessel runoff to the left foot. Moderate grade stenosis in the proximal left posterior tibial artery. 4. Open wound in the right foot with subcutaneous gas and multiple abscesses in the mid foot along the plantar surface and dorsal subcutaneous tissues. 5. Poorly defined fluid collections in the left foot and in the subcutaneous tissues of the anterior left knee which are concerning for additional abscesses. Electronically signed by: Freda Lanes, MD 04/24/2024 5:46 PM EDT          Workstation ID: [id number]     Result Date: 04/24/2024  HISTORY: peripheral artery disease Technique: Noncontrast CT imaging is acquired through the chest for bolus tracking purposes. Enhanced CT imaging through the abdomen, pelvis, and lower extremities is performed during arterial phase of IV contrast menstruation as part of a CTA runoff. Angiographic reconstructions are performed formed at a separate workstation. The patient received an uneventful IV bolus of 100 mL of Omnipaque 350 nonionic contrast as part of the study. COMPARISON STUDY: none FINDINGS: Vascular structures and lower extremities: Abdominal aorta is normal in caliber and enhancement. Celiac, SMA, and renal arteries are moderately calcified yet patent with no high-grade stenosis. IMA is patent. Mild narrowing of the left common iliac artery. The right common iliac and external iliac arteries are patent. Hypogastric arteries are patent. On the right, common femoral and profunda are patent. Diffuse mild disease of the right SFA with moderate grade narrowing in the mid SFA just proximal to the patent stent. The right popliteal artery is patent. Single-vessel runoff to the right foot through the peroneal artery. There  is gradual diminished enhancement in the anterior and posterior tibial arteries. The ankle. Ulcerations are present in the right foot with subcutaneous gas and a vacuum. 2 cm abscess in the plantar subcutaneous tissues with an air-fluid level noted. Additional 2.4 cm abscess in the dorsal subcutaneous tissues of the mid foot noted with an air-fluid level. On the left, common femoral artery is mildly narrowed secondary to plaque and the profunda is patent. Diffuse moderate disease of the left SFA with partially calcified plaque. Popliteal artery is patent. Moderate grade stenosis at the proximal posterior tibial artery. Two-vessel runoff via the posterior tibial and peroneal arteries. Diminished enhancement noted in the distal anterior tibial artery. Along the lateral aspect of the midfoot there is a 2.5 x 4.6 cm area of low density which may represent a developing abscess or phlegmon. There is also a region of edema in the anterior left lower leg at the level of the ankle. At the level of the knee, there is a irregular shaped fluid collection in the anterolateral knee just lateral to the tibial tuberosity which measures approximately 4.2 cm in diameter. Bibasilar subsegmental atelectasis. Heart size is borderline enlarged. The liver has a slight nodular contour. The gallbladder is present and contains excreted contrast. The spleen, pancreas, and adrenal glands are within normal notes. 2.6 cm left renal cyst. Kidneys are otherwise normal in morphology with no hydronephrosis. Appendix and small bowel loops are unremarkable. Diverticulosis is noted throughout the colon. No free intraperitoneal fluid or air. Uterus contains a coarsely calcified leiomyoma. No adnexal or pelvic mass. Urinary bladder is unremarkable. Osseous structures the pelvis and lumbar spine are intact. No compression deformities.     IMPRESSION: 1. No aortic inflow stenosis. 2. Moderate grade stenosis of the mid right SFA and patent SFA stent. Patent  right popliteal artery with single  vessel runoff to the right foot. 3. Diffuse moderate disease of the left SFA with patent popliteal artery and diseased two-vessel runoff to the left foot. Moderate grade stenosis in the proximal left posterior tibial artery. 4. Open wound in the right foot with subcutaneous gas and multiple abscesses in the mid foot along the plantar surface and dorsal subcutaneous tissues. 5. Poorly defined fluid collections in the left foot and in the subcutaneous tissues of the anterior left knee which are concerning for additional abscesses. Electronically signed by: Daryle Darden, MD 04/19/2024 2:45 PM EDT          Workstation ID: [id number]     IR ANGIOGRAM EXTREMITY RIGHT  Result Date: 04/21/2024  Radiology exam is complete. No Radiologist dictation. Please follow up with ordering provider.     CT FOOT LEFT W CONTRAST  Result Date: 04/17/2024  HISTORY: Swelling and pain. TECHNIQUE: Helical acquisition CT images through the left foot was performed with contrast with multiplanar reformats. COMPARISON: None. FINDINGS: No visualized acute fracture or dislocation. No evidence of periosteal reaction or bony destruction to suggest osteomyelitis. There is extensive subcutaneous fat edema and stranding within the lateral soft tissues from the level of the mid foot to the forefoot. In particular, there is a prominent area of focal fluid measuring approximately 3.8 x 2.1 cm on image 20, series 5 that extends superficially to abut the skin and may represent a developing abscess. There is no definite visualized enhancing wall.     IMPRESSION: 1. Findings are compatible with cellulitis of the lateral midfoot and forefoot with more focal 3.8 cm area of fluid at the level of the midfoot that is suspicious for an early developing abscess as there is no discrete capsule. 2. No evidence of osteomyelitis. Electronically signed by: Lillia Hug, MD 04/17/2024 11:37 AM EDT          Workstation ID: [id number]      LE ARTERIAL  Result Date: 04/16/2024                                                                                                                                    Version: 1                                                                                                                                   Study ID:  [id number]                                                       [facility]                                                       [address] [place], [place]  [address]                                                           Arterial Lower Extremity Report Name: Susan [name], Susan [name] Date: 04/15/2024, 2: 25 PM MRN: [id number]                                                               Patient Location: [facility] Age: 63 Years                                                               DOB: 03-Nov-1950 (MM/DD/YYYY)                                                                               Gender: Female Performed By: Edilia Romelia Elden Sherrell, RVT                          Account #:: [id number] Ordering Physician: ANASTASIO CAIN A Reason For Study: Peripheral vascular disease.  INTERPRETATION SUMMARY 1. Moderate right lower extremity arterial insufficiency at the level of the iliac-femoral region. 2. Severe left lower extremity arterial insufficiency at the level of the femoral-popliteal region. 3. Digital pressures deferred due to patient's pain. QUALITY/PROCEDURE Arterial Physio ABI [phone]). A bilateral lower extremity arterial exam was performed. Continuous wave Doppler tracings were recorded from the common femoral, popliteal, posterior tibial and dorsalis pedis arteries bilaterally, and resting ankle brachial pressures were measured. Digital pressures deferred due to patient's pain. RIGHT  LEG SEGMENTAL Brachial = 131 mmHg. Ankle Pressure (PT) = 81 mmHg. Ankle Pressure (DP) = 83 mmHg. Right ABI = 0.63. The lower extremity ankle brachial index is 0.63 on the right. The right lower extremity waveforms are monophasic in the femoral, the popliteal, the posterior tibial and the dorsalis pedis arteries. RIGHT DIGIT PPG Digit 1 flow is absent. LEFT LEG SEGMENTAL Brachial = 126 mmHg. Ankle (PT) = 59 mmHg. Ankle (DP) = 63 mmHg. Left ABI = 0.48. The lower extremity ankle brachial index is 0.48 on the left. The left lower extremity waveforms are multiphasic in the femoral, and monophasic in the popliteal, the posterior tibial and the dorsalis pedis arteries. LEFT DIGIT PPG Digit 1 flow is absent. ______________________________________________________________________________ Electronically signed by: Dr Blenda Fear, M.D                       04/16/2024, 10: 01 PM     US  RETROPERITONEAL COMPLETE  Result Date: 04/15/2024  INDICATION: Acute renal failure. COMPARISON: None. TECHNIQUE: Transabdominal grayscale and duplex sonography of the retroperitoneum performed. FINDINGS: RIGHT KIDNEY:  10.7 cm in length. (Normal range 9-13 cm) Normal renal parenchymal echogenicity. No obstruction, renal calculi or focal lesion. LEFT KIDNEY: 9.8 cm in length. (Normal range 9-13 cm) Normal renal parenchymal echogenicity.  And interpolar simple-appearing partially exophytic left renal cyst measures 2.6 x 2.6 x 2.1 cm. URINARY BLADDER: Bladder has a pre-void volume of 240.0 mL. (<50 mL WNL) Patient did not void for the exam. Neither ureteral jet(s) identified. No urinary bladder wall thickening or debris within the urinary bladder. (Normal wall thickness <3 mm distended, <5 mm underdistended.) Bladder contains a Foley catheter and is decompressed. PANCREAS:  Visualized pancreas is within normal limits. IVC: Patent. ABDOMINAL AORTA: Normal caliber abdominal aorta. COMMON ILIAC ARTERIES: Suboptimally visualized secondary to interposed  bowel gas.     IMPRESSION: 1.  No hydronephrosis. Kidneys unremarkable sonographically with the exception of a partially exophytic simple-appearing interpolar left renal cyst. 2.  Urinary bladder unremarkable sonographically. 3.  Common iliac arteries above the visualized sonographically due to interposed bowel gas Electronically signed by: Alm Conte, MD 04/15/2024 8:34 PM EDT          Workstation ID: [id number]     CT FOOT RIGHT W CONTRAST  Result Date: 04/15/2024  Indication: Right foot infection. Evaluate for abscess. Comparison: None available Technique: Computed tomographic imaging of the right foot was performed following IV contrast administration. Coronal and sagittal reconstructions were created from the initial axial data set and submitted for interpretation. Findings: Complex gas and fluid containing abscess//phlegmon in the subcutaneous lateral hindfoot spanning approximately 4.4 cm craniocaudal by 2 x 1.5 cm transverse. This is positioned posterior to the peroneal tendons which are intact and not overtly involved. There is subtle loss of bony cortex/rarefaction at the lateral margin of the subjacent calcaneus concerning for osteomyelitis.     IMPRESSION: 1. 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 Electronically signed by: Glade Reeve, MD 04/15/2024 2:16 PM EDT          Workstation ID: [id number]     XR FOOT RIGHT (2 VIEWS)  Result Date: 04/15/2024  Exam: 2 views of the right foot. Clinical indications:  PAIN. RULE OUT OSTEOMYELITIS. Right foot pain and swelling. Comparison:   04/15/2024;  Findings:  No fracture.  No dislocation. No radiopaque foreign bodies. No bone erosions.     Impression:  No fracture. No bone erosions to suggest osteomyelitis. Electronically signed by: Celine Rear, MD 04/15/2024 12:52 AM EDT          Workstation ID: [id number]     XR FOOT LEFT (2 VIEWS)  Result Date:  04/15/2024  Exam: 2 views of the left foot. Clinical indications:  PAIN right foot. Swelling. Possible osteomyelitis. Comparison:  None Findings:  No fracture.  No dislocation. No bone erosions. No radiopaque foreign bodies.     Impression:  No fracture. Electronically signed by: Celine Rear, MD 04/15/2024 12:51 AM EDT          Workstation ID: [id number]     XR CHEST PORTABLE  Result Date: 04/15/2024  Exam: AP portable chest Clinical indication: SEPSIS Comparison: 04/08/2024;  Results:  Left basilar opacity. No pneumothorax. Heart normal.  Mediastinal contours are normal. No free air is seen under the hemidiaphragms.   Osseous structures intact.     IMPRESSION: Left basilar opacity. Developing pneumonia versus atelectasis. Electronically signed by: Celine Rear, MD 04/15/2024 12:41 AM EDT          Workstation ID: [id number]     VL DUP LOWER EXTREMITY VENOUS BILATERAL  Result Date: 04/09/2024                                                                                                                                    Version: 1                                                                                                                                   Study ID: [id number]  [facility]                                                       [address] [place], [place]  [address]                                                      Lower Extremity Venous Duplex Report Name: KARENANN, MCGRORY Date: 04/08/2024, 2: 73 PM MRN: [id number]                                                               Patient Location: ER^[facility]^WT^[facility] Age: 40 Years                                                               DOB: [birth year] (MM/DD/YYYY)                                                                               Gender:  Female Performed By: Kylee R. Holloman, RVT                                       Account #:: [id number] Ordering Physician: [facility], ADIT B Reason For Study: Lower extremity pain and swelling                                                     INTERPRETATION SUMMARY 1. No evidence of deep vein thrombosis in the bilateral lower extremities. QUALITY/PROCEDURE Complete bilateral venous duplex performed. Quality of the study is good. Location: ER. RIGHT LEG The deep venous system of  the right lower extremity was examined using duplex ultrasound. B-mode imaging demonstrated normal compressibility of the right common femoral, femoral, popliteal, posterior tibial and peroneal veins. Doppler flow signals were spontaneous and phasic at all levels. RIGHT SAPHENOUS VEINS Right great saphenous vein at the saphenofemoral junction demonstrates normal compressibility with spontaneous and phasic venous hemodynamics. LEFT LEG The deep venous system of the left lower extremity was examined using duplex ultrasound. B-mode imaging demonstrated normal compressibility of the left common femoral, femoral, popliteal, posterior tibial and peroneal veins. Doppler flow signals were spontaneous and phasic at all levels. LEFT LEG SAPHENOUS VEINS Left great saphenous vein at the saphenofemoral junction demonstrates normal compressibility with spontaneous and phasic venous hemodynamics. ______________________________________________________________________________ Electronically signed by: Dr Blenda Fear, M.D                       04/09/2024, 8: 57 AM     XR CHEST (2 VW)  Result Date: 04/08/2024  History: Shortness of breath Comparison: None Technique: PA and lateral radiographs of the chest were obtained. Two views, 2 exposures total. Findings: The lungs and pleural spaces are clear. Normal cardiomediastinal silhouette. Degenerative changes of the thoracic spine.     IMPRESSION: No acute cardiopulmonary disease. Electronically signed by: Norleen Gleason  04/08/2024 11:20 AM EDT          Workstation ID: [id number]        I have independently reviewed lab studies and imgaing as well as review of nursing notes and physican notes from the past 24 hours.    Dragon medical dictation software was used for portions of this report. Unintended errors may occur.     ANN OLDEN, DO  May 01, 2024    [facility]   Office Phone:[phone]  Fax:[phone]

## 2024-05-01 NOTE — Care Coordination-Inpatient (Signed)
 Physician's Certification Medical Necessity Statement For  Non-Emergency Ambulance Service    Patient: Susan [name] Age: 73 y.o. Sex: female    Date of Birth: November 10, 1950 Admit Date: 04/14/2024 PCP: Susan Garnette RAMAN, MD   MRN: [id number]  CSN: [id number]  Room: 5212/5212     Chief Complaint:   Chief Complaint   Patient presents with    Foot Swelling    Fatigue      Height/Weight: Body mass index is 28.18 kg/m. Body mass index is 28.18 kg/m.   Weight:     Weight - Scale: 81.6 kg (179 lb 14.3 oz)  Weight Source:      Transport From:  [x] Hospital    [] SNF   [] Residence   [] Other:   Transport To:      [] Hospital    [x] SNF   [] Residence   [] Other:   Address:    [facility]    Medicare: AN[employer]M FULL DUAL ADVANTAGE 2  Transport Date: 05/02/2024  Origin: [facility]  Destination: [facility]    Is the Patient's stay covered under Medicare Part A (PPS/DRG?)  []  Yes    []  No     Closest appropriate facility?  []  Yes    []  No     If no, why was the patient transported to another facility?   If hospital to hospital transfer, describe services needed at 2nd facility not available at 1st facility:   If hospice Pt, is this transport related to Pt's terminal illness? []  Yes    []  No   Describe:     SECTION II - MEDICAL NECESSITY QUESTIONNAIRE  Ambulance transportation is medically necessary only if other means of transport are contraindicated or would be potentially   Harmful to the patient. To meet this requirement, the patient must be either bed confined or suffer from condition such   that transport by means other than an ambulance is contraindicated by the patient's condition.The following questions   must be addressed by the healthcare professional signing below for this form to be valid:    1) Describe the MEDICAL CONDITION space (physical and/or mental) of this patient AT THE TIME OF AMBULANCE TRANSPORT       that requires the patient to be transported in an ambulance, and why transport in an ambulance, and why transport by        other means is contraindicated by the patient's condition:  Impaired mobility due to recent right foot procedure (6 days post-op), bilateral leg pain and neuropathy     2) Is this patient bed confined as defined below?  [x]  Yes    []  No           To be bed confined the patient must satisfy all 3 of the following criteria:           (1) unable to get up from bed without assistance; AND           (2) unable to ambulate; AND           (3) unable to sit in a chair or wheelchair.    3) Can this patient safely be transported by car or wheelchair van (I.e., may safely sit during transport, without an attendant or monitoring?)      []  Yes    [x]  No    4) In addition to completing questions 1-3 above, please check any of the following conditions that apply*:       *Note: Supporting documentation for any boxes checked  must be maintained in the patient's medical records    Patient requires ambulance transportation due to the following condition:  [] Airway control or positioning required en route  [] Altered Mental Status, Etiology:   [] Amputation of lower extremity, Site:    (BKA / AKA / Other)  [] Asphyxia or Hypoxemia, Etiology:   [] Cancer, Site:   [] Cardiac or Hemodynamic Monitoring required en route  [] Cerebrovascular disease, WITH: Cognitive Defects  [] Cerebrovascular disease, WITH: Hemiparesis / Hemiplegia  [] Cerebrovascular disease, WITH: Monoplegia of a lower limb  [] Chest wall injury  [] Contractures of extremities, Site:   [] Decubitus ulcer, Site:   [] Fracture, Site:   [] Head Injury  [] IV Fluid Management, Medications being administered:   [] PIV:   []  Yes    []  No  [] Oxygen required, patient cannot self-administer, Reason:   [] Oxygen requiring titrated therapy en route  [] Patient Safety: Danger to self or others - flight risk / monitoring  [x] Patient Safety: Risk of falling off wheelchair or stretcher while in motion  [] Restraints required:  [] Verbal      [] Physical    [] Chemical  [] Special handling en route to  reduce pain  [] Special handling en route for patient positioning  [] Special handling en route for Isolation, Type:     Diagnosis:   [] Suctioning required en route  [] Torso or Trunk injury  [] Ventilator Dependent  [x] Other:  Impaired mobility due to recent right foot procedure (6 days post-op), bilateral leg pain and neuropathy     SECTION III - SIGNATURE OF PHYSICIAN OR OTHER AUTHORIZED HEALTHCARE PROFESSIONAL  I certify that the above information is accurate based on my evaluation of this patient, and that the medical necessity provisions   of 42 CFR 410.40 (e)(1) are met, requiring that this patient be transported by ambulance.  I understand this information will be used by   the centers for Medicare and Medicaid Services (CMS) to support the determination of medical necessity for ambulance services.  I represent   that I am the beneficiary's attending physician; or an employee of the beneficiary's attending physician, or the hospital or facility where the   beneficiary is being treated and from which the beneficiary is being transported; that I have personal knowledge of the beneficiary's   condition at the time of transport; and that I meet all Medicare regulations and applicable state licensure laws for the credentialed indicated.    [] If this box is checked, I also certify that the patient is physically or mentally incapable of signing the ambulance services claim form and   that the institution with which I am affiliated has furnished care, services or assistance to the patient.  My signature below was made on behalf   of the patient pursuant to 14 RQM575.63(a)(5).  In accordance with 42 [id number], the specific reason(s) that the patient is physically   or mentally incapable of signing the claim form is as follows.    Electronically signed by:  Susan [name]  05/02/2024     Name: _________________________Title: ______________________  Facility: [facility]  Phone Number: [phone]  Date: 05/02/2024     Please call  to arrange transportation: [phone]  [email or web address]

## 2024-05-01 NOTE — Progress Notes (Signed)
 OCCUPATIONAL THERAPY EVALUATION   Acknowledge Orders  Time  OT Charge Capture  Rehab Caseload Tracker    [facility] AM-PAC 6 Clicks Daily Activity Inpatient Short Form    Patient: Susan [name] (73 y.o. female)  Room: 5212/5212    Primary Diagnosis: Foot abscess [L02.619]  Right leg pain [M79.604]  Abscess of right foot [L02.611]   Procedure(s) (LRB):  DEBRIDEMENT RIGHT FOOT WITH GRAFT APPLICATION; APPLICATION OF WOUND VAC;  DRAINAGE OF ABSCESS LEFT FOOT (Bilateral) 6 Days Post-Op  Date of Admission: 04/14/2024   Length of Stay:  16 day(s)  Insurance: Payor: BCBS MEDICARE / Plan: ANTHEM FULL DUAL ADVANTAGE 2 / Product Type: *No Product type* /      Date: 05/01/2024  Time In: 1351        Time Out: 1438       Total Minutes: 47   Treatment time: 32 minutes  Treatment included: therapeutic exercise, therapeutic activity, energy conservation, functional mobility retraining, functional balance retraining, ADI retraining, activity tolerance, education for positioning, adaptive equipment training and/or educational instruction during/immediately following OT evaluation    Isolation:  No active isolations       MDRO: No active infections    Precautions: falls   Ordered weight bearing status:   -Per podiatry on 7/27 Limited WB to bilateral LE (feet) in sx shoe with assistance of cane  -Per Vascular MD C. Murter  on 7/31 WBAT     Current diet order: ADULT DIET; Regular  ADULT ORAL NUTRITION SUPPLEMENT; Breakfast, Lunch, Dinner; Wound Healing Oral Supplement    ASSESSMENT   Based on the objective data described below, the patient presents with     -  no active participation in functional mobility/OOB activities due to wound care Rn exchanging wound vac  -  decreased independence/ability to perform basic ADLs/IADLs  -  decreased independence in functional mobility   -  generalized muscle weakness affecting function in ADLs  -Completed an in depth chart review and consulted with vascular MD and primary Rn  -patient  completed UB therapeutic activity utilizing music  -patient completed oral and facial hygiene from supported sitting in bed after set-up with items.   -Education and encouraged to actively conduct her upper body care including bed level bath.  -Pt overwhelmed about set of circumstance. OT provided active listening and emotional support.   -She agreeable to working with therapy in-conjunction with on going medical intervention to avoid further deconditioning.      Patient will benefit from skilled occupational therapy intervention to address the above impairments.    Patient's rehabilitation potential is considered to be Good for below stated goals.     Recommendations:  Recommend continued occupational therapy during acute stay.   Recommend out of bed activity to counteract ill effects of bedrest, with assistance from staff as needed.    Discharge Recommendations:   -  Skilled nursing facility (SNF):  Would benefit to improve independence in ADLs, strength, activity tolerance and balance to ensure a successful and sustainable return to prior level of function.    FUNCTIONAL ASSESSMENT  AM-PAC Inpatient Daily Activity Raw Score: 16 (05/01/24 1448)    -  SNF:  Current research shows that an AM-PAC score of 17 or less is not associated with a discharge to the patient's home setting.  Based on an AM-PAC score and their current ADL deficits; it is recommended that the patient have 3-5 sessions per week of Occupational Therapy at d/c to increase the patient's independence.  This AMPAC score should be considered in conjunction with interdisciplinary team recommendations to determine the most appropriate discharge setting. Patient's social support, diagnosis, medical stability, and prior level of function should also be taken into consideration.    Equipment Recommendations for Discharge:   -  DME needs to be determined at time of d/c from rehab     PRIOR LEVEL OF FUNCTION     Information was obtained by: patient,  physical therapy evaluation  Home environment:  lives with her daughter and son in law in a 2 story home, 1st floor bedroom, 2 steps to enter  Prior level of function: progressive decline in mobility with last ambulation in June of 2025. Has been essentially bedbound at home 2/2 B LE pain  Home equipment: rollator, WC (broken?)  -Per Pt's report her daughter helped her with ADLs and her grand-daughter helped her with walking back in June prior to rapid declined       PLAN      Patient will be followed by occupational therapy to address goals while hospitalized as patient's status and schedule permit.   Patient to be seen 2-3 days x 4 weeks for Adaptive equipment, ADI training, activity tolerance, functional balance training, functional mobility training, therapeutic exercise, therapeutic activity, patient/caregiver education and training, home exercise program, and neuromuscular re-education     Patient and/or family have participated as able in goal setting and plan of care.    GOALS     OT goals initiated 05/01/2024 and will be met by patient within 10 sessions     -  patient will transfer into sitting at EOB with moderate assistance and complete grooming after set-up  - patient will tolerate a dynamic upper body activity >30 minutes seated at EOB to maximize her UB AROM, strength and included her dynamic sitting balance needed for ADLs and help with functional transfers.   -Patient will tolerate 1-2 minutes bouts of standing using RW and bilateral sx shoes in prep to help with OOB transfers.   -Per podiatry on 7/27 Limited WB to bilateral LE (feet) in sx shoe with assistance of cane    EDUCATION/COMMUNICATION     Barriers to learning/limitations: None    Education provided un:[id number] on (+) role of OT, (+) OT plan of care, (+) Instructed patient in the benefits of maintaining activity tolerance, functional mobility, and independence with self care tasks during acute stay  to ensure safe return home and to  baseline. Encouraged patient to increase frequency and duration OOB, be out of bed for all meals, perform daily ADLs (as approved by RN/MD regarding bathing etc), and performing functional mobility to/from bathroom with staff assistance as needed., (+) instructed patient on the importance of activity while hospitalized to prevent a decline in function, (+) encouraged patient to sit up in chair for 45 (+) minutes or as tolerated 2-3 times a day, with staff assistance as needed, (+) the importance of maintaining UE muscle strength and activity tolerance while hospitalized to prevent a decline in function    Educational handouts issued: none this session    Patient / family response to education: verbalized and demonstrated understanding    SUBJECTIVE     Patient I just feel like I need to focus on healing first.     Pain Assessment: bilateral pain     OBJECTIVE DATA SUMMARY     Orders, labs, and chart reviewed on Jouri C Fredericksen. Communicated with nursing staff. Patient cleared to participate in Occupational Therapy evaluation.  Patient was admitted to the hospital on 04/14/2024 with   Chief Complaint   Patient presents with    Foot Swelling    Fatigue     Present illness history:   Patient Active Problem List    Diagnosis Date Noted    Foot abscess 04/15/2024    OSA (obstructive sleep apnea)     Dyslipidemia     Asthma     Psychiatric disorder     Chronic obstructive pulmonary disease (HCC)     Bipolar 1 disorder (HCC)     Peripheral neuropathy 06/19/2013    Hypertension 06/19/2013      Previous medical history:   Past Medical History:   Diagnosis Date    Hyperlipidemia     Hypertension        PATIENT FOUND     Reclined in bed with wound care at bedside     COGNITIVE STATUS        Mental status:  Orientation: Patient is oriented to person, place, month and year  Communication: normal, speech and language intact  Attention Span:  normal  General Cognition:  intact   Follows commands: intact  Safety/Judgement:  appropriate awareness of environment and need for assistance, appropriate awareness of environment  Hearing:   grossly intact  Vision:   grossly intact    UPPER EXTREMITY ASSESSMENT      Dominance:right  RIGHT: active range of motion is WNL  Strength: grossly graded   LEFT:   active range of motion is WNL  Strength: grossly graded       ACTIVITIES OF DAILY LIVING (10 Minutes)     Based on direct observation, simulation and clinical assessment.  (clincial judgement based on: activity tolerance, balance, safety awareness, cognition, functional strength/ROM/coordination of all extremities)    Eating:           - set-up, bed level   Grooming:     - bed level , set-up  UB bathing:   - moderate assistance  LB bathing:   -  maximum assistance  UB dressing: - moderate assistance  LB dressing: - maximum assistance, total assistance  Toileting:       - total assistance    Treatment:   -completed oral and facial hygiene from supported sitting in bed      THERAPEUTIC ACTIVITIES: FUNCTIONAL MOBILITY AND BALANCE STATUS   (22 Minutes)     Treatment:    -  Therapeutic activity included upper extremities AROM with music therapy:  Warm-up  Shoulder rows  Chest presses   Overhead extensions  ABD/ADD horizontal extensions  Lateral hip sways  Cool-down stretches     ACTIVITY TOLERANCE     - good tolerance to activity during OT session  - motivated to increase activity  - no apparent distress    FINAL LOCATION     Patient positioned in bed, all needs within reach, agrees to call for assistance, (+) chair exit alarm - attached to nurse call system    Thank you for this referral.  STEPHANIE L LEMELLE, OTR/L  May 01, 2024

## 2024-05-01 NOTE — Care Coordination-Inpatient (Addendum)
 Transport to: [facility]  Reason for transport: Impaired mobility due to recent right foot procedure (6 days post-op), bilateral leg pain and neuropathy  Transport set up with: [facility]   Time/Date: 05/02/2024 @ 1600  D/C Summary loaded: Pending  Nurse/CM notified: Yes  Envelope delivered: Floor notified  Insurance verified on face sheet: Yes  Auth needed: No  Auth #:

## 2024-05-01 NOTE — Care Coordination-Inpatient (Addendum)
 SNF Approved  [facility]  [address]  AUTH# [id number]  Valid 04/30/24 to 05/02/24  Phone [phone], ext [phone]  Fax# [phone]  Case Manager - [place]  Next review Date 05/02/24  Last day to admit 05/02/24 before 11:59pm

## 2024-05-02 LAB — CBC WITH AUTO DIFFERENTIAL
Basophils: 0.5 % (ref 0–3)
Eosinophils: 1.2 % (ref 0–5)
Hematocrit: 24.6 % — ABNORMAL LOW (ref 35.0–47.0)
Hemoglobin: 8 g/dL — ABNORMAL LOW (ref 11.0–16.0)
Immature Granulocytes %: 1.1 % (ref 0.0–3.0)
Lymphocytes: 18.6 % — ABNORMAL LOW (ref 28–48)
MCH: 29.6 pg (ref 25.4–34.6)
MCHC: 32.5 g/dL (ref 30.0–36.0)
MCV: 91.1 fL (ref 80.0–98.0)
MPV: 10.2 fL — ABNORMAL HIGH (ref 6.0–10.0)
Monocytes: 13.3 % — ABNORMAL HIGH (ref 1–13)
Neutrophils Segmented: 65.3 % — ABNORMAL HIGH (ref 34–64)
Nucleated RBCs: 0 (ref 0–0)
Platelets: 483 1000/mm3 — ABNORMAL HIGH (ref 140–450)
RBC: 2.7 M/uL — ABNORMAL LOW (ref 3.60–5.20)
RDW: 54.5 — ABNORMAL HIGH (ref 36.4–46.3)
WBC: 7.6 1000/mm3 (ref 4.0–11.0)

## 2024-05-02 LAB — CELL COUNT WITH DIFFERENTIAL, BODY FLUID
Body Fluid Wbc: 102550 10*3
Lymphocytes, Body Fluid: 11 %
Monocytes, Body Fluid: 2 %
SEGMENTED NEUTROPHILS, BODY FLUID: 87 %

## 2024-05-02 LAB — RENAL FUNCTION PANEL
Albumin: 1.9 g/dL — ABNORMAL LOW (ref 3.4–5.0)
Anion Gap: 10 mmol/L (ref 5–15)
BUN: 8 mg/dL — ABNORMAL LOW (ref 9–23)
CO2: 27 meq/L (ref 20–31)
Calcium: 7.7 mg/dL — ABNORMAL LOW (ref 8.7–10.4)
Chloride: 99 meq/L (ref 98–107)
Creatinine: 0.8 mg/dL (ref 0.55–1.02)
GFR African American: >60
GFR Non-African American: >60
Glucose: 73 mg/dL — ABNORMAL LOW (ref 74–106)
Phosphorus: 3.4 mg/dL (ref 2.4–5.1)
Potassium: 4.1 meq/L (ref 3.5–5.1)
Sodium: 136 meq/L (ref 136–145)

## 2024-05-02 LAB — CRYSTALS, BODY FLUID: CRYSTALS, BODY FLUID: NOT DETECTED

## 2024-05-02 MED ORDER — CEFEPIME (MAXIPIME) INFUSION (OUTPATIENT ONLY)
Freq: Three times a day (TID) | INTRAMUSCULAR | 0.00 refills | Status: AC
Start: 2024-05-02 — End: 2024-05-30

## 2024-05-02 MED ORDER — POTASSIUM CHLORIDE ER 10 MEQ PO TBCR
10 | Freq: Every day | ORAL | 1.00 refills | 30.00000 days | Status: DC
Start: 2024-05-02 — End: 2024-09-02

## 2024-05-02 MED ORDER — SODIUM CHLORIDE FLUSH 0.9 % IV SOLN
0.9 | Freq: Every day | INTRAVENOUS | Status: DC
Start: 2024-05-02 — End: 2024-05-02
  Administered 2024-05-02: 17:00:00 10 mL

## 2024-05-02 MED ORDER — ATORVASTATIN CALCIUM 80 MG PO TABS
80 | Freq: Every evening | ORAL | 1.00 refills | 90.00000 days | Status: AC
Start: 2024-05-02 — End: ?

## 2024-05-02 MED ORDER — ASPIRIN 81 MG PO TBEC
81 | Freq: Every day | ORAL | 1.00 refills | 30.00000 days | Status: AC
Start: 2024-05-02 — End: ?

## 2024-05-02 MED ORDER — PANTOPRAZOLE SODIUM 40 MG PO TBEC
40 | Freq: Every day | ORAL | 1.00 refills | 30.00000 days | Status: AC
Start: 2024-05-02 — End: ?

## 2024-05-02 MED ORDER — AMLODIPINE BESYLATE 5 MG PO TABS
5 | Freq: Every day | ORAL | 1.00 refills | 60.00000 days | Status: DC
Start: 2024-05-02 — End: 2024-09-02

## 2024-05-02 MED ORDER — OXYCODONE-ACETAMINOPHEN 7.5-325 MG PO TABS
7.5-325 | ORAL_TABLET | ORAL | 0 refills | 7.00000 days | Status: AC | PRN
Start: 2024-05-02 — End: 2024-05-07

## 2024-05-02 MED ORDER — LIDOCAINE HCL (PF) 1 % IJ SOLN
1 | Freq: Once | INTRAMUSCULAR | 0.00 refills | 10.00000 days | Status: AC
Start: 2024-05-02 — End: 2024-05-02

## 2024-05-02 MED ORDER — IPRATROPIUM-ALBUTEROL 0.5-2.5 (3) MG/3ML IN SOLN
0.5-2.5 | RESPIRATORY_TRACT | 1.00 refills | 30.00000 days | Status: AC | PRN
Start: 2024-05-02 — End: ?

## 2024-05-02 MED ORDER — SENNOSIDES 8.6 MG PO TABS
8.6 | Freq: Every day | ORAL | 0.00 refills | 30.00000 days | Status: AC | PRN
Start: 2024-05-02 — End: 2024-06-01

## 2024-05-02 MED ORDER — TRELEGY ELLIPTA 200-62.5-25 MCG/ACT IN AEPB
200-62.5-25 | Freq: Every day | RESPIRATORY_TRACT | 3.00 refills | 30.00000 days | Status: AC
Start: 2024-05-02 — End: ?

## 2024-05-02 MED ORDER — QUETIAPINE FUMARATE 100 MG PO TABS
100 | Freq: Every evening | ORAL | 1.00 refills | 60.00000 days | Status: DC
Start: 2024-05-02 — End: 2024-09-02

## 2024-05-02 MED ORDER — MYRBETRIQ 25 MG PO TB24
25 | Freq: Every day | ORAL | 2.00 refills | 60.00000 days | Status: AC
Start: 2024-05-02 — End: ?

## 2024-05-02 MED ORDER — VALSARTAN 160 MG PO TABS
160 | Freq: Two times a day (BID) | ORAL | 1.00 refills | 90.00000 days | Status: DC
Start: 2024-05-02 — End: 2024-09-02

## 2024-05-02 MED ORDER — CLOPIDOGREL BISULFATE 75 MG PO TABS
75 | Freq: Every day | ORAL | 2.00 refills | 30.00000 days | Status: AC
Start: 2024-05-02 — End: ?

## 2024-05-02 MED ORDER — DAKINS (1/4 STRENGTH) 0.125 % EX SOLN
0.125 | Freq: Every day | CUTANEOUS | 2 refills | Status: AC
Start: 2024-05-02 — End: ?

## 2024-05-02 MED ORDER — CARVEDILOL 12.5 MG PO TABS
12.5 | Freq: Two times a day (BID) | ORAL | 2.00 refills | 30.00000 days | Status: AC
Start: 2024-05-02 — End: ?

## 2024-05-02 MED ORDER — METRONIDAZOLE 500 MG/100ML IV SOLN
500 | Freq: Three times a day (TID) | INTRAVENOUS | 0.00 refills | Status: AC
Start: 2024-05-02 — End: 2024-05-16

## 2024-05-02 MED ORDER — FUROSEMIDE 20 MG PO TABS
20 | Freq: Every day | ORAL | 1.00 refills | 30.00000 days | Status: DC
Start: 2024-05-02 — End: 2024-09-02

## 2024-05-02 MED ORDER — SODIUM CHLORIDE FLUSH 0.9 % IV SOLN
0.9 | INTRAVENOUS | Status: DC | PRN
Start: 2024-05-02 — End: 2024-05-02

## 2024-05-02 MED ORDER — DSS 100 MG PO CAPS
100 | Freq: Two times a day (BID) | ORAL | Status: DC | PRN
Start: 2024-05-02 — End: 2024-09-02

## 2024-05-02 MED FILL — SODIUM CHLORIDE FLUSH 0.9 % IV SOLN: 0.9 % | INTRAVENOUS | Qty: 10 | Fill #0

## 2024-05-02 MED FILL — CEFEPIME HCL 1 G IJ SOLR: 1 g | INTRAMUSCULAR | Qty: 1000 | Fill #0

## 2024-05-02 MED FILL — ATORVASTATIN CALCIUM 40 MG PO TABS: 40 mg | ORAL | Qty: 1 | Fill #0

## 2024-05-02 MED FILL — CLOPIDOGREL BISULFATE 75 MG PO TABS: 75 mg | ORAL | Qty: 1 | Fill #0

## 2024-05-02 MED FILL — SODIUM-POTASSIUM-PHOSPHORUS 160-280-250 MG PO PACK: 160-280-250 mg | ORAL | Qty: 1 | Fill #0

## 2024-05-02 MED FILL — HEPARIN SODIUM (PORCINE) 5000 UNIT/ML IJ SOLN: 5000 [IU]/mL | INTRAMUSCULAR | Qty: 1 | Fill #0

## 2024-05-02 MED FILL — VALSARTAN 40 MG PO TABS: 40 mg | ORAL | Qty: 4 | Fill #0

## 2024-05-02 MED FILL — METRONIDAZOLE 500 MG/100ML IV SOLN: 500 MG/100ML | INTRAVENOUS | Qty: 100 | Fill #0

## 2024-05-02 MED FILL — AMLODIPINE BESYLATE 5 MG PO TABS: 5 mg | ORAL | Qty: 1 | Fill #0

## 2024-05-02 MED FILL — ASPIRIN LOW DOSE 81 MG PO TBEC: 81 mg | ORAL | Qty: 1 | Fill #0

## 2024-05-02 MED FILL — OXYCODONE-ACETAMINOPHEN 7.5-325 MG PO TABS: 7.5-325 mg | ORAL | Qty: 1 | Fill #0

## 2024-05-02 MED FILL — MORPHINE SULFATE 2 MG/ML IJ SOLN: 2 mg/mL | INTRAMUSCULAR | Qty: 1 | Fill #0

## 2024-05-02 MED FILL — CARVEDILOL 6.25 MG PO TABS: 6.25 mg | ORAL | Qty: 2 | Fill #0

## 2024-05-02 NOTE — Discharge Instructions (Signed)
DISCHARGE SUMMARY from Nurse    PATIENT INSTRUCTIONS:  If you experience any of the following symptoms ***, please follow up with ***.    *  Please give a list of your current medications to your Primary Care Provider.    *  Please update this list whenever your medications are discontinued, doses are      changed, or new medications (including over-the-counter products) are added.    *  Please carry medication information at all times in case of emergency situations.    These are general instructions for a healthy lifestyle:    No smoking/ No tobacco products/ Avoid exposure to second hand smoke  Surgeon General's Warning:  Quitting smoking now greatly reduces serious risk to your health.    Obesity, smoking, and sedentary lifestyle greatly increases your risk for illness    A healthy diet, regular physical exercise & weight monitoring are important for maintaining a healthy lifestyle    You may be retaining fluid if you have a history of heart failure or if you experience any of the following symptoms:  Weight gain of 3 pounds or more overnight or 5 pounds in a week, increased swelling in our hands or feet or shortness of breath while lying flat in bed.  Please call your doctor as soon as you notice any of these symptoms; do not wait until your next office visit.

## 2024-05-02 NOTE — Care Coordination-Inpatient (Signed)
 [facility]   Care Management Discharge Note    Patient Name: Susan [name]     MRN: [id number]   Age: 73 y.o.   Sex: female                   Primary Care Physician To, Garnette RAMAN, MD  Unit/Room: 5212/5212  Admit Date: 04/14/2024   Length of stay: 17  Admitting Diagnosis: Foot abscess [L02.619]  Right leg pain [M79.604]  Abscess of right foot [L02.611]   Primary Payer [employer] FULL DUAL ADVANTAGE 2  Current Attending Physician: Onita Pill, MD    Discharge Date:  05/02/2024          IMM Given and Documented:  yes  Was LTSS or PASSR Requested: Patient was provided with the 2nd Important Message from Medicare (IMM) on the day of discharge and was informed of their right to remain in the hospital for up to four (4) hours after receipt of the IMM. The patient acknowledged understanding of this option and elected to proceed with discharge prior to the end of the 4-hour period. no  Confirmed LTSS or PASSR uploaded to Careers information officer: no    Discharge Plan: SNF    Complete if Discharging to a facility, ALF, or Group Home    Facility name and confirmed acceptance with (LINK/Phone Call- List person spoken to):  [facility]  Freedom of choice for agency received from patient or appointed decision maker:  yes    Confirmed facility bed availability and agreed upon DC Date/Time with (contact name): KEITH,    Complete if Discharging with Home Health    Home Health agency name and confirmed acceptance with:       Freedom of choice for facility received from patient:      Home health order placed and routed to agency; F2F complete for government insurance?      SOC Date and Confirmed With:     Patient confirmed active PCP to utilize home health services:      D/C Medications sent to  pharmacy    DME ordered for Discharge (including O2 & Wound vac):   WOUND VAC ORDERED FOR RIGHT FOOT ,     Confirmed delivery with: FR[facility]O    Dialysis?      Schedule (location/day/chair time/transport method):   If new,  patient provided with center information/schedule (yes/no):       [facility], [facility], [facility], Prior Auth Med, or referral to [facility] placed?        Method of Transportation and Time if scheduled:  MEDICAL  1600          Patient/Family, RN, physician aware of transport time:  yes     FAMILY AWARE OF PICK UP TIME AND DC PLANS     SN TO CALL [phone] TO GIVE REPORT            Signed:   Karna Scull , Case Manager  May 02, 2024  2:03 PM  Department Phone: [phone]

## 2024-05-02 NOTE — Progress Notes (Addendum)
 INFECTIOUS DISEASE FOLLOW UP NOTE     Date of admission: 04/14/2024     Date of consult: April 15, 2024        ABX:      Current abx Prior abx    Cefepime  7/14-18  Flagyl  7/15-17 Vanco 7/14-14      ASSESSMENT:       Right foot abscess  -Unclear cause of swelling and pain that has been present for several months  -Has been evaluated by orthopedic  -Patient seen by [facility] specialist Dr. Curtistine Emperor with last visit 03/11/2024 for right contusion of dorsum of foot, lower extremity edema and right foot pronation deformity  -03/08/2024 MRI right hindfoot no fracture.  Mild multifocal osseous edema?  Altered gait.  And mechanical stress-related changes.  Mild intrinsic foot muscular atrophic and intramuscular edema.  Subcu edema along the dorsum of the foot  -xray no fracture  -S/p I&D by ED, cultures unrevealing  - Patient seen by podiatrist Dr. Roberts, OR I and D done 7/17, cultures unrevealing, path consistent with acute on chronic osteomyelitis     L knee effusion  -Concern for potential abscess on CTA. IR attempted aspiration on 7/31 but pt did not tolerate procedure. Ortho then aspirated abscess below knee 7/31.  Cultures NGTD   Left foot swelling  -Xray no fracture but CT concerning for abscess  - Status post OR I and D 7/25   Leucocytosis POA   AKI/CKD  -Abnormal creatinine few weeks ago on routine blood work at [facility]   PAD  -Patient has been following with vascular surgeon and last seen in office on 04/08/2024  -12/2023 ABI at [facility] with severe left lower extremity arterial insufficiency at rest and right first digital disease with known proximal arterial insufficiency  -12/2023 s/p right lower extremity angiogram showed single-vessel peroneal runoff with good branches   Ch hypoxic resp failure   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    Tmax 98.9, HR 79, BP 119/70, RR 20, 96% O2  labs include creatinine 0.80 WBC 7.6  Micro 7/25 fungal and AFB left foot smear negative, anaerobic NGTD, routine NG TD; 7/17 wound right foot Gram stain negative, culture NG, ANA NG, right heel bone Gram stain negative, culture negative, ANA NG, 7/15 urine less than 10k, superficial wound culture negative, 7/14 blood culture x 2 negative, 7/31 knee abscess cultures NGTD  Path 7/17 right heel acute and chronic osteomyelitis       Assessment     Right foot abscess  - Coming with swelling, pain of right foot   - I&D in ED by provider and cultures negative  - CT concerning for abscess on right foot  - 7/17 s/p OR with I&D and placement of wound VAC -or cultures negative   - Path suggestive of acute and chronic osteomyelitis   - 7/19 CT still showing multiple abscesses in the midfoot and subcu area  - SP 7/25 debridement right foot with  graft application application of wound VAC by Dr. Rosamond  -S/P Angiogram/balloon angioplasty and stent placement of L SFA 04/30/24    L subpatellar knee abscess  -Concern for potential abscess on CTA. IR attempted intra-articular aspiration on 7/31 but pt did not tolerate procedure. Ortho aspirated abscess below knee 7/31, cultures NGTD    Left foot swelling  - Tenderness on dorsal aspect  - 7/17 CT suggestive of cellulitis with focal area of collection?  Abscess and CTA 7/19 poorly defined collections foot and subcu left knee  - Covering with broad-spectrum antibiotics  - OR  drainage of abscess left foot on 7/25 with right foot surgery  - Routine and anaerobic culture no growth to date, AFB and fungal added for completeness     PAD  -Previous history of angio but recent ABI with monophasic waveforms in femoral and plan for angio per vascular  -Vascular study 7/16 moderate right iliac-femoral, severe left arterial insufficiency at femoropopliteal region  -s/p angio 7/21 RLE with right SFA intervention     Bilateral leg edema  - Recent PVL 7/8 was negative for DVT but low threshold to repeat     Leukocytosis -normal now     AKI?  CKD  - Avoid nephrotoxic medications and dose for current creatinine clearance  - Renal ultrasound without any stone or obstruction    Other details as above       Rec:  - Continue cefepime  and Flagyl  for now.   - With all cultures this admission to date negative for MRSA and Enterococcus Vanco was stopped earlier this admission. Given path findings of osteomyelitis recommend 6-week cefepime  plus ~4-week metronidazole  (if no symptoms of neuropathy/toxicity--none so far). So far no positive cultures to guide antibiotic selection, patient with penicillin allergy but tolerating current therapy.  - Monitor pending cultures from left foot abscess (including AFB and fungal) and L knee subpatellar abscess  from 7/31  -Local wound care per others  - Podiatry follow up  - Fluid collection seen on CT and exam below left knee aspirated by ortho 7/31. Cultures pending. No concern for intra articular abscess per ortho.   - Keep legs elevated on pillows  - Monitor serial labs  - Monitor for any ADE  -Risks/benefits of PICC d/w pt and she is amenable.       OPAT  Antibiotic with stop dates  Cefepime  1g q8h until 05/26/24  PO Flagyl  500mg  Q8H until 05/16/24  Indication: Osteomyelitis of right heel  PICC care per protocol (10ml flush with NS pre and post infusion. 3 ml heparin  flush (10 units/ml) post infusion or every 24 hours)  Discontinue PICC line after completion of Abx   CBC  diff, CMP, ESR, CRP Q weekly while on IV Abx  Lab results to be faxed to: [phone]  Call [phone] for questions/concerns  ID Follow Up: 05/08/24 at 9:30AM with [facility]  [address]  [place], [place]   [address]                           Ann Olden, DO  Infectious Disease  [facility]      MICROBIOLOGY:      7/14 Blcx x2 NG              Wd cx NG  7/15 Ucx less than 10K UG F  7/17 anaerobic x 2 NG wound NOS, NG surgical  wound NG right heel  7/25 anaerobic no growth to date  Routine Gram stain rare WBC NOS, NGTD  AFB IP  Fungal IP     LINES AND CATHETERS:      PIV     SUMMARY:      Susan [name] is a 73 year old  female with past medical history of COPD, schizoaffective disorder, hypertension, depression, PAD, peripheral neuropathy was admitted on 7/14 with worsening leg pain.     Patient is not a good historian and says lives with her daughter.  She has been almost bedbound for quite some time. Pt has been following with Nsx for basilar artery aneurysm and conservative Mx.  Patient also developed right leg swelling for which she was referred to Ortho and eventually podiatry.  It was not thought to be the part of her back leading to right leg swelling and neuropathy.  Patient also following with vascular and had procedure done in March with good runoff and did not think was concern for pain and swelling.  Patient last seen by podiatry 03/11/2024 and plan for conservative management.      She was seen in ED on 8 July with leg swelling.  Workup showed negative PVL for DVT and chest x-ray without any significant finding.  She was noted with pitting edema and was given some Lasix  and asked to follow-up with PCP for adjustment in medications.  Patient returned 7/14 with worsening swelling of the legs, right worse than left.  Workup showed stable vitals, afebrile state.  WBC elevated at 19, Hb 9.8, PLT 354, BUN 34, CR 1.43, AST 17, ALT 14.   Blood cultures were obtained.  Patient had bilateral feet x-ray which showed no fracture or concern for osteo.  Chest x-ray was suggestive of left basilar opacity with developing pneumonia versus atelectasis.  Patient had I&D in the ED.  Podiatry was consulted and CT was ordered.  Wound culture in progress.  Patient on broad-spectrum antibiotic with cefepime  and Vanco.  ID was asked for further evaluation management.       SUBJECTIVE :     Interval notes reviewed. Afebrile overnight.  Ortho aspirated abscess below knee-pus found. Cultures sent and are pending. No new complaints.   OBJECTIVE     BP 119/70   Pulse 79   Temp 98.1 F (36.7 C) (Temporal)   Resp 20   Ht 1.702 m (5' 7)   Wt 81.6 kg (179 lb 14.3 oz)   SpO2 96%   BMI 28.18 kg/m     Temp (24hrs), Avg:98.4 F (36.9 C), Min:98.1 F (36.7 C), Max:98.9 F (37.2 C)    General: Fairly developed and nourished 73 y.o. year-old, female, chronically sick  HEENT: Normocephalic, anicteric sclerae   Neck: Supple   Chest: Symmetrical expansion  Abdomen: Soft, non-tender,non distended, no organomegaly, BS+  Musculoskeletal: Right foot wound VAC  left foot postop dressing clean dry intact not removed; L  knee wrapped with subpatellar bandage,    SKIN: No obvious skin lesion or rash. Dry, warm, intact     MEDICATIONS:     Current Facility-Administered Medications   Medication Dose Route Frequency Provider Last Rate Last Admin    valsartan  (DIOVAN ) tablet 160 mg  160 mg Oral BID Josepha Planas, DO   160 mg at 05/01/24 2025    amLODIPine  (NORVASC ) tablet 5 mg  5 mg Oral Daily Josepha Planas, DO   5 mg at 05/01/24 0839    carvedilol  (COREG ) tablet 12.5 mg  12.5  mg Oral BID WC Josepha Planas, DO   12.5 mg at 05/01/24 1736    potassium & sodium phosphates  (PHOS-NAK) 280-160-250 MG packet 250 mg  1 packet Oral 4x Daily Josepha Planas, DO   250 mg at 05/01/24 2033    lidocaine  PF 1 % injection 1 mL  1 mL IntraDERmal Once Vyas, Shruti, DPM        dextrose  50 % IV solution  10-50 mL  IntraVENous PRN Josepha Planas, DO   50 mL at 04/25/24 1000    glucagon  injection 1 mg  1 mg IntraMUSCular PRN Josepha Planas, DO        cefepime  (MAXIPIME ) 1,000 mg in sodium chloride  0.9 % 50 mL IVPB (mini-bag)  1,000 mg IntraVENous Q8H Matriano, Norleen Deward DEL, MD 12.5 mL/hr at 05/02/24 0432 1,000 mg at 05/02/24 0432    atorvastatin  (LIPITOR ) tablet 40 mg  40 mg Oral Nightly Maree Ladona HERO, MD   40 mg at 05/01/24 2025    aspirin  EC tablet 81 mg  81 mg Oral Daily Maree Ladona HERO, MD   81 mg at 05/01/24 1048    clopidogrel  (PLAVIX ) tablet 75 mg  75 mg Oral Daily Maree Ladona HERO, MD   75 mg at 05/01/24 1048    ipratropium 0.5 mg-albuterol  2.5 mg (DUONEB ) nebulizer solution 1 Dose  1 Dose Inhalation Q4H PRN Sahibzada, Ayaz, MD        senna (SENOKOT) tablet 8.6 mg  1 tablet Oral Daily PRN Matriano, Norleen Deward DEL, MD        docusate sodium  (COLACE) capsule 100 mg  100 mg Oral BID PRN Matriano, Norleen Deward DEL, MD        ondansetron  (ZOFRAN ) injection 4 mg  4 mg IntraVENous Q6H PRN Matriano, John Paul H, MD   4 mg at 04/21/24 1544    acetaminophen  (TYLENOL ) tablet 650 mg  650 mg Oral Q6H PRN Matriano, John Paul H, MD   650 mg at 04/28/24 2100    melatonin tablet 3 mg  3 mg Oral Nightly PRN Matriano, Norleen Deward DEL, MD        naloxone  (NARCAN ) injection 0.4 mg  0.4 mg IntraVENous PRN Matriano, John Paul H, MD        heparin  (porcine) injection 5,000 Units  5,000 Units SubCUTAneous BID Sahibzada, Ayaz, MD   5,000 Units at 05/01/24 2025    benzonatate  (TESSALON ) capsule 100 mg  100 mg Oral TID PRN Murrell Norleen Deward DEL, MD        morphine  (PF) injection 1 mg  1 mg IntraVENous Q3H PRN Matriano, John Paul H, MD   1 mg at 05/01/24 1244    sodium hypochlorite (DAKINS) 0.125 % external solution   Irrigation Daily Anastasio Adine LABOR, DPM   Given at 04/29/24 0920    metroNIDAZOLE  (FLAGYL ) 500 mg in 0.9% NaCl 100 mL IVPB premix  500 mg IntraVENous Q8H Desai, Aarti S, MD   Stopped at 05/02/24 0432    oxyCODONE -acetaminophen  (PERCOCET ) 7.5-325 MG per  tablet 1 tablet  1 tablet Oral Q4H PRN Josepha Planas, DO   1 tablet at 05/01/24 1736         Labs: Results:   Chemistry Recent Labs     04/30/24  0646 05/01/24  0535 05/02/24  0521   NA 137 137 136   K 4.1 4.0 4.1   CL 99 100 99   CO2 30 30 27    BUN 10 10 8*  CBC w/Diff Recent Labs     04/30/24  0646 05/01/24  0535 05/02/24  0521   WBC 9.0 9.2 7.6   RBC 2.82* 2.72* 2.70*   HGB 8.2* 8.0* 8.0*   HCT 25.8* 24.6* 24.6*   PLT 590* 519* 483*          RADIOLOGY :        Imaging  XR KNEE ARTHROGRAM LEFT S&I  Result Date: 05/01/2024  INDICATION: left knee effusion  R/O septic knee PROCEDURE: FL KNEE ARTHROGRAM LEFT SANDI TECHNIQUE: The left knee was prepped and draped and locally infiltrated with 1% lidocaine . Despite numerous attempts of the joint space was not accessed primarily because the patient was intolerant.. COMPARISON: None FINDINGS: Unable to access the joint space. Primarily due to the intolerance of the patient.     IMPRESSION: Unable to access the joint space primarily due to the patient's intolerance to the procedure.. Electronically signed by: Norleen Netters, MD 05/01/2024 10:31 AM EDT          Workstation ID: [id number]     CTA ABDOMINAL AORTA W BILAT RUNOFF W WO CONTRAST  Addendum Date: 04/24/2024  HISTORY: peripheral artery disease Technique: Noncontrast CT imaging is acquired through the chest for bolus tracking purposes. Enhanced CT imaging through the abdomen, pelvis, and lower extremities is performed during arterial phase of IV contrast menstruation as part of a CTA runoff. Angiographic reconstructions are performed formed at a separate workstation. The patient received an uneventful IV bolus of 100 mL of Omnipaque 350 nonionic contrast as part of the study. 3D angiographic post processing and image rendering was performed by the CT technologist at a separate workstation including coronal and sagittal maximal intensity projection (MIP) reconstructions. COMPARISON STUDY: none FINDINGS: Vascular  structures and lower extremities: Abdominal aorta is normal in caliber and enhancement. Celiac, SMA, and renal arteries are moderately calcified yet patent with no high-grade stenosis. IMA is patent. Mild narrowing of the left common iliac artery. The right common iliac and external iliac arteries are patent. Hypogastric arteries are patent. On the right, common femoral and profunda are patent. Diffuse mild disease of the right SFA with moderate grade narrowing in the mid SFA just proximal to the patent stent. The right popliteal artery is patent. Single-vessel runoff to the right foot through the peroneal artery. There is gradual diminished enhancement in the anterior and posterior tibial arteries. The ankle. Ulcerations are present in the right foot with subcutaneous gas and a vacuum. 2 cm abscess in the plantar subcutaneous tissues with an air-fluid level noted. Additional 2.4 cm abscess in the dorsal subcutaneous tissues of the mid foot noted with an air-fluid level. On the left, common femoral artery is mildly narrowed secondary to plaque and the profunda is patent. Diffuse moderate disease of the left SFA with partially calcified plaque. Popliteal artery is patent. Moderate grade stenosis at the proximal posterior tibial artery. Two-vessel runoff via the posterior tibial and peroneal arteries. Diminished enhancement noted in the distal anterior tibial artery. Along the lateral aspect of the midfoot there is a 2.5 x 4.6 cm area of low density which may represent a developing abscess or phlegmon. There is also a region of edema in the anterior left lower leg at the level of the ankle. At the level of the knee, there is a irregular shaped fluid collection in the anterolateral knee just lateral to the tibial tuberosity which measures approximately 4.2 cm in diameter. Bibasilar subsegmental atelectasis. Heart size is borderline enlarged. The liver  has a slight nodular contour. The gallbladder is present and  contains excreted contrast. The spleen, pancreas, and adrenal glands are within normal notes. 2.6 cm left renal cyst. Kidneys are otherwise normal in morphology with no hydronephrosis. Appendix and small bowel loops are unremarkable. Diverticulosis is noted throughout the colon. No free intraperitoneal fluid or air. Uterus contains a coarsely calcified leiomyoma. No adnexal or pelvic mass. Urinary bladder is unremarkable. Osseous structures the pelvis and lumbar spine are intact. No compression deformities. IMPRESSION: 1. No aortic inflow stenosis. 2. Moderate grade stenosis of the mid right SFA and patent SFA stent. Patent right popliteal artery with single vessel runoff to the right foot. 3. Diffuse moderate disease of the left SFA with patent popliteal artery and diseased two-vessel runoff to the left foot. Moderate grade stenosis in the proximal left posterior tibial artery. 4. Open wound in the right foot with subcutaneous gas and multiple abscesses in the mid foot along the plantar surface and dorsal subcutaneous tissues. 5. Poorly defined fluid collections in the left foot and in the subcutaneous tissues of the anterior left knee which are concerning for additional abscesses. Electronically signed by: Freda Lanes, MD 04/24/2024 5:46 PM EDT          Workstation ID: [id number]     Result Date: 04/24/2024  HISTORY: peripheral artery disease Technique: Noncontrast CT imaging is acquired through the chest for bolus tracking purposes. Enhanced CT imaging through the abdomen, pelvis, and lower extremities is performed during arterial phase of IV contrast menstruation as part of a CTA runoff. Angiographic reconstructions are performed formed at a separate workstation. The patient received an uneventful IV bolus of 100 mL of Omnipaque 350 nonionic contrast as part of the study. COMPARISON STUDY: none FINDINGS: Vascular structures and lower extremities: Abdominal aorta is normal in caliber and enhancement. Celiac,  SMA, and renal arteries are moderately calcified yet patent with no high-grade stenosis. IMA is patent. Mild narrowing of the left common iliac artery. The right common iliac and external iliac arteries are patent. Hypogastric arteries are patent. On the right, common femoral and profunda are patent. Diffuse mild disease of the right SFA with moderate grade narrowing in the mid SFA just proximal to the patent stent. The right popliteal artery is patent. Single-vessel runoff to the right foot through the peroneal artery. There is gradual diminished enhancement in the anterior and posterior tibial arteries. The ankle. Ulcerations are present in the right foot with subcutaneous gas and a vacuum. 2 cm abscess in the plantar subcutaneous tissues with an air-fluid level noted. Additional 2.4 cm abscess in the dorsal subcutaneous tissues of the mid foot noted with an air-fluid level. On the left, common femoral artery is mildly narrowed secondary to plaque and the profunda is patent. Diffuse moderate disease of the left SFA with partially calcified plaque. Popliteal artery is patent. Moderate grade stenosis at the proximal posterior tibial artery. Two-vessel runoff via the posterior tibial and peroneal arteries. Diminished enhancement noted in the distal anterior tibial artery. Along the lateral aspect of the midfoot there is a 2.5 x 4.6 cm area of low density which may represent a developing abscess or phlegmon. There is also a region of edema in the anterior left lower leg at the level of the ankle. At the level of the knee, there is a irregular shaped fluid collection in the anterolateral knee just lateral to the tibial tuberosity which measures approximately 4.2 cm in diameter. Bibasilar subsegmental atelectasis. Heart size is borderline enlarged. The  liver has a slight nodular contour. The gallbladder is present and contains excreted contrast. The spleen, pancreas, and adrenal glands are within normal notes. 2.6 cm  left renal cyst. Kidneys are otherwise normal in morphology with no hydronephrosis. Appendix and small bowel loops are unremarkable. Diverticulosis is noted throughout the colon. No free intraperitoneal fluid or air. Uterus contains a coarsely calcified leiomyoma. No adnexal or pelvic mass. Urinary bladder is unremarkable. Osseous structures the pelvis and lumbar spine are intact. No compression deformities.     IMPRESSION: 1. No aortic inflow stenosis. 2. Moderate grade stenosis of the mid right SFA and patent SFA stent. Patent right popliteal artery with single vessel runoff to the right foot. 3. Diffuse moderate disease of the left SFA with patent popliteal artery and diseased two-vessel runoff to the left foot. Moderate grade stenosis in the proximal left posterior tibial artery. 4. Open wound in the right foot with subcutaneous gas and multiple abscesses in the mid foot along the plantar surface and dorsal subcutaneous tissues. 5. Poorly defined fluid collections in the left foot and in the subcutaneous tissues of the anterior left knee which are concerning for additional abscesses. Electronically signed by: Daryle Darden, MD 04/19/2024 2:45 PM EDT          Workstation ID: [id number]     IR ANGIOGRAM EXTREMITY RIGHT  Result Date: 04/21/2024  Radiology exam is complete. No Radiologist dictation. Please follow up with ordering provider.     CT FOOT LEFT W CONTRAST  Result Date: 04/17/2024  HISTORY: Swelling and pain. TECHNIQUE: Helical acquisition CT images through the left foot was performed with contrast with multiplanar reformats. COMPARISON: None. FINDINGS: No visualized acute fracture or dislocation. No evidence of periosteal reaction or bony destruction to suggest osteomyelitis. There is extensive subcutaneous fat edema and stranding within the lateral soft tissues from the level of the mid foot to the forefoot. In particular, there is a prominent area of focal fluid measuring approximately 3.8 x 2.1 cm on  image 20, series 5 that extends superficially to abut the skin and may represent a developing abscess. There is no definite visualized enhancing wall.     IMPRESSION: 1. Findings are compatible with cellulitis of the lateral midfoot and forefoot with more focal 3.8 cm area of fluid at the level of the midfoot that is suspicious for an early developing abscess as there is no discrete capsule. 2. No evidence of osteomyelitis. Electronically signed by: Lillia Hug, MD 04/17/2024 11:37 AM EDT          Workstation ID: [id number]     LE ARTERIAL  Result Date: 04/16/2024                                                                                                                                    Version: 1  Study ID: [id number]                                                       [facility]                                                       [address] [place], [place]  [address]                                                           Arterial Lower Extremity Report Name: Susan [name], Susan [name] Date: 04/15/2024, 2: 13 PM MRN: [id number]                                                               Patient Location: [facility] Age: 69 Years                                                               DOB: Mar 31, 1951 (MM/DD/YYYY)                                                                               Gender: Female Performed By: Edilia Romelia Elden Sherrell, RVT                          Account #:: [id number] Ordering Physician: ANASTASIO CAIN A Reason For Study: Peripheral vascular disease.  INTERPRETATION SUMMARY 1. Moderate right lower extremity arterial insufficiency at the level of  the iliac-femoral region. 2. Severe left lower extremity arterial insufficiency at the level of the femoral-popliteal region. 3. Digital pressures deferred due to patient's pain. QUALITY/PROCEDURE Arterial Physio ABI [id number]). A bilateral lower extremity arterial exam was performed. Continuous wave Doppler tracings were recorded from the common femoral, popliteal, posterior tibial and dorsalis pedis arteries bilaterally, and resting ankle brachial pressures were measured. Digital pressures deferred due to patient's pain. RIGHT LEG SEGMENTAL Brachial = 131 mmHg. Ankle Pressure (PT) = 81 mmHg. Ankle Pressure (DP) = 83 mmHg. Right ABI = 0.63. The lower extremity ankle brachial index is 0.63 on the right. The right lower extremity waveforms are monophasic in the femoral, the popliteal, the posterior tibial and the dorsalis pedis arteries. RIGHT DIGIT PPG Digit 1 flow is absent. LEFT LEG SEGMENTAL Brachial = 126 mmHg. Ankle (PT) = 59 mmHg. Ankle (DP) = 63 mmHg. Left ABI = 0.48. The lower extremity ankle brachial index is 0.48 on the left. The left lower extremity waveforms are multiphasic in the femoral, and monophasic in the popliteal, the posterior tibial and the dorsalis pedis arteries. LEFT DIGIT PPG Digit 1 flow is absent. ______________________________________________________________________________ Electronically signed by: Dr Blenda Fear, M.D                       04/16/2024, 10: 01 PM     US  RETROPERITONEAL COMPLETE  Result Date: 04/15/2024  INDICATION: Acute renal failure. COMPARISON: None. TECHNIQUE: Transabdominal grayscale and duplex sonography of the retroperitoneum performed. FINDINGS: RIGHT KIDNEY:  10.7 cm in length. (Normal range 9-13 cm) Normal renal parenchymal echogenicity. No obstruction, renal calculi or focal lesion. LEFT KIDNEY: 9.8 cm in length. (Normal range 9-13 cm) Normal renal parenchymal echogenicity.  And interpolar simple-appearing partially exophytic left renal cyst measures 2.6 x 2.6 x  2.1 cm. URINARY BLADDER: Bladder has a pre-void volume of 240.0 mL. (<50 mL WNL) Patient did not void for the exam. Neither ureteral jet(s) identified. No urinary bladder wall thickening or debris within the urinary bladder. (Normal wall thickness <3 mm distended, <5 mm underdistended.) Bladder contains a Foley catheter and is decompressed. PANCREAS:  Visualized pancreas is within normal limits. IVC: Patent. ABDOMINAL AORTA: Normal caliber abdominal aorta. COMMON ILIAC ARTERIES: Suboptimally visualized secondary to interposed bowel gas.     IMPRESSION: 1.  No hydronephrosis. Kidneys unremarkable sonographically with the exception of a partially exophytic simple-appearing interpolar left renal cyst. 2.  Urinary bladder unremarkable sonographically. 3.  Common iliac arteries above the visualized sonographically due to interposed bowel gas Electronically signed by: Alm Conte, MD 04/15/2024 8:34 PM EDT          Workstation ID: [id number]     CT FOOT RIGHT W CONTRAST  Result Date: 04/15/2024  Indication: Right foot infection. Evaluate for abscess. Comparison: None available Technique: Computed tomographic imaging of the right foot was performed following IV contrast administration. Coronal and sagittal reconstructions were created from the initial axial data set and submitted for interpretation. Findings: Complex gas and fluid containing abscess//phlegmon in the subcutaneous lateral hindfoot spanning approximately 4.4 cm craniocaudal by 2 x 1.5 cm transverse. This is positioned posterior to the peroneal tendons which are intact and not overtly involved. There is subtle loss of bony cortex/rarefaction at the lateral margin of the subjacent calcaneus concerning for osteomyelitis.     IMPRESSION: 1. 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 Electronically signed by: Glade Reeve, MD 04/15/2024 2:16 PM EDT          Workstation ID:  [id number]     XR FOOT RIGHT (2 VIEWS)  Result Date: 04/15/2024  Exam: 2 views of the right foot. Clinical indications:  PAIN. RULE OUT OSTEOMYELITIS. Right foot pain and swelling. Comparison:   04/15/2024;  Findings:  No fracture.  No dislocation. No radiopaque foreign bodies. No bone erosions.     Impression:  No fracture. No bone erosions to suggest osteomyelitis. Electronically signed by: Celine Rear, MD 04/15/2024 12:52 AM EDT          Workstation ID: [id number]     XR FOOT LEFT (2 VIEWS)  Result Date: 04/15/2024  Exam: 2 views of the left foot. Clinical indications:  PAIN right foot. Swelling. Possible osteomyelitis. Comparison:  None Findings:  No fracture.  No dislocation. No bone erosions. No radiopaque foreign bodies.     Impression:  No fracture. Electronically signed by: Celine Rear, MD 04/15/2024 12:51 AM EDT          Workstation ID: [id number]     XR CHEST PORTABLE  Result Date: 04/15/2024  Exam: AP portable chest Clinical indication: SEPSIS Comparison: 04/08/2024;  Results:  Left basilar opacity. No pneumothorax. Heart normal.  Mediastinal contours are normal. No free air is seen under the hemidiaphragms.   Osseous structures intact.     IMPRESSION: Left basilar opacity. Developing pneumonia versus atelectasis. Electronically signed by: Celine Rear, MD 04/15/2024 12:41 AM EDT          Workstation ID: [id number]     VL DUP LOWER EXTREMITY VENOUS BILATERAL  Result Date: 04/09/2024                                                                                                                                    Version: 1                                                                                                                                   Study ID: [id number]  [facility]                                                       [address] [place], [place]  [address]                                                      Lower Extremity Venous Duplex Report Name: Susan [name], Susan [name] Date: 04/08/2024, 2: 36 PM MRN: [id number]                                                               Patient Location: ER^[facility]^WT^[facility] Age: 54 Years                                                               DOB: [birth year] (MM/DD/YYYY)                                                                               Gender: Female Performed By: Kylee R. Holloman, RVT                                       Account #:: [id number] Ordering Physician: [facility], ADIT B Reason For Study: Lower extremity pain and swelling                                                     INTERPRETATION SUMMARY 1. No evidence of deep vein thrombosis in the bilateral lower extremities. QUALITY/PROCEDURE Complete bilateral venous duplex performed. Quality of the study is good. Location: ER. RIGHT LEG The deep venous system of  the right lower extremity was examined using duplex ultrasound. B-mode imaging demonstrated normal compressibility of the right common femoral, femoral, popliteal, posterior tibial and peroneal veins. Doppler flow signals were spontaneous and phasic at all levels. RIGHT SAPHENOUS VEINS Right great saphenous vein at the saphenofemoral junction demonstrates normal compressibility with spontaneous and phasic venous hemodynamics. LEFT LEG The deep venous system of the left lower extremity was examined using duplex ultrasound. B-mode imaging demonstrated normal compressibility of the left common femoral, femoral, popliteal, posterior tibial and peroneal veins. Doppler flow signals were spontaneous and phasic at all levels. LEFT LEG SAPHENOUS VEINS Left great saphenous vein at the saphenofemoral junction demonstrates normal compressibility with spontaneous and phasic venous hemodynamics.  ______________________________________________________________________________ Electronically signed by: Dr Blenda Fear, M.D                       04/09/2024, 8: 57 AM     XR CHEST (2 VW)  Result Date: 04/08/2024  History: Shortness of breath Comparison: None Technique: PA and lateral radiographs of the chest were obtained. Two views, 2 exposures total. Findings: The lungs and pleural spaces are clear. Normal cardiomediastinal silhouette. Degenerative changes of the thoracic spine.     IMPRESSION: No acute cardiopulmonary disease. Electronically signed by: Norleen Gleason 04/08/2024 11:20 AM EDT          Workstation ID: [id number]        I have independently reviewed lab studies and imgaing as well as review of nursing notes and physican notes from the past 24 hours.    Dragon medical dictation software was used for portions of this report. Unintended errors may occur.     ANN OLDEN, DO  May 02, 2024    [facility] Infectious [facility]   Office [phone]  [phone]

## 2024-05-02 NOTE — Procedures (Signed)
 PROCEDURE NOTE  Date: 05/02/2024   Name: Susan [name]  Date of Birth: [birth year]    Picc line    Date/Time: 05/02/2024 11:45 AM    Performed by: Alona Carola HERO, RN  Authorized by: Onita Pill, MD  Consent: Written consent obtained.  Risks and benefits: risks, benefits and alternatives were discussed  Consent given by: patient  Patient understanding: patient states understanding of the procedure being performed  Patient consent: the patient's understanding of the procedure matches consent given  Procedure consent: procedure consent matches procedure scheduled  Patient identity confirmed: verbally with patient, arm band and hospital-assigned identification number  Time out: Immediately prior to procedure a time out was called to verify the correct patient, procedure, equipment, support staff and site/side marked as required.  Indications: vascular access    Anesthesia:  Local Anesthetic: lidocaine  1% without epinephrine  Anesthetic total: 4 mL    Sedation:  Patient sedated: no    Preparation: skin prepped with ChloraPrep  Skin prep agent dried: skin prep agent completely dried prior to procedure  Sterile barriers: all five maximum sterile barriers used - cap, mask, sterile gown, sterile gloves, and large sterile sheet  Hand hygiene: hand hygiene performed prior to central venous catheter insertion  Location: right brachial vein.  Patient position: flat  Catheter size: 5 Fr  Ultrasound guidance: yes  Sterile ultrasound techniques: sterile gel and sterile probe covers were used  Number of attempts: 1  Successful placement: yes  Post-procedure: dressing applied  Assessment: blood return through all ports  Patient tolerance: patient tolerated the procedure well with no immediate complications

## 2024-05-02 NOTE — Plan of Care (Signed)
 Problem: Respiratory - Adult  Goal: Achieves optimal ventilation and oxygenation  Outcome: Progressing     Problem: Pain  Goal: Verbalizes/displays adequate comfort level or baseline comfort level  Outcome: Progressing     Problem: Safety - Adult  Goal: Free from fall injury  Outcome: Progressing

## 2024-05-02 NOTE — Discharge Summary (Signed)
 Discharge Summary       Patient: Susan [name] Age: 73 y.o. DOB: [birth year] MR#: [id number] SSN: [id number]  PCP on record: To, Garnette RAMAN, MD  Admit date: 04/14/2024  Discharge date: 05/02/2024    Admission Diagnoses:     Foot abscess [L02.619]  Right leg pain [M79.604]  Abscess of right foot [L02.611]    -    Discharge Diagnoses:     Right foot abscess and acute osteomyelitis  Left foot abscess  Left foot swelling  PAD  Chronic hypoxic resp failure  COPD  Possible pneumonia  Bipolar disorder    Medical History   Per H&P,  Susan [name] is a 73 y.o. year old female who presents with  foot pain.  Patient is a very pleasant 73 year old female with peripheral artery disease, COPD, chronic respiratory failure uses 2 L home oxygen, comes in with foot pain.  Patient reports she has had pain on both feet for 2 to 3 months, follows with vascular surgery.  She had an MRI done as an outpatient last month.  However in the past few days there was increasing pain, and then yesterday she noted swelling on her right foot/ankle lateral side.  She felt some chills but denies any fever.  There was no trauma on that foot.  Patient presented in ER and noted to have an abscess on her right lateral foot which was I&D.  Patient started on IV antibiotics and podiatry was consulted.  Patient reports her breathing is at baseline, however she had some cough started few hours ago.    Hospital Course by Problem     Patient seen for complicated medical problems as listed below :     Right foot abscess /osteomyelitis   7/17 s/p OR with I&D and placement of wound VAC on right foot-OR cultures negative so far  7/19 CT still showing multiple abscesses in the right midfoot and subcu area  Status post right angiogram on 04/21/24, discussed with vascular surgery.  wound VAC removed, tissue clean, good bleeding.  Path : Acute and chronic osteomyelitis   ID consult appreciated  6-week cefepime  plus ~4-week metronidazoles     PICC line ordered, follow-up  with ID as outpatient  Continue wound vac and wound care in the rehab     Left foot abscess,   CT showed bilateral foot abscess. Left foot abscess  status post I&D by podiatry and left side angiogram on July 30, continue to wound care     PAD bilateral lower extremities status post bilateral angiogram, Balloon angioplasty left SFA with stent x2, 5x40 distal SFA and 6x40 mid-SFA, continue Plavix  and aspirin  follow-up with vascular surgeon as outpatient     Chronic hypoxic resp failure continue nasal cannula oxygen maintain O2 sat greater than 90%  COPD continue bronchodilator with butyryl and Atrovent  Possible pneumonia complete antibiotics  7.   Left knee fluid collection on CTA with some fluctuance and pain on exam.  Orthopedic consult  Aspiration fluids no bacteria and no WBC.Left lower leg abscess below the knee. This is not intraarticular. This was aspirated at the bedside and incision and drained.     8.  Bipolar disorder continue Seroquel  100 mg nightly at home    continue home regimen for otherwise chronic, stable medical conditions as noted above.     Stable transfer to the rehab with wound care and IV antibiotics          Today's examination of the patient revealed:  Objective:   VS: BP 119/70   Pulse 79   Temp 98.1 F (36.7 C) (Temporal)   Resp 20   Ht 1.702 m (5' 7)   Wt 81.6 kg (179 lb 14.3 oz)   SpO2 96%   BMI 28.18 kg/m    Tmax/24hrs: Temp (24hrs), Avg:98.4 F (36.9 C), Min:98.1 F (36.7 C), Max:98.9 F (37.2 C)     Input/Output:   Intake/Output Summary (Last 24 hours) at 05/02/2024 0851  Last data filed at 05/02/2024 0439  Gross per 24 hour   Intake --   Output 600 ml   Net -600 ml       General appearance: no distress  Eyes: sclera anicteric  ENT: no oral lesions, thyroid normal  Skin: no spider angiomata, jaundice,   Respiratory: clear to auscultation bilaterally  Cardiovascular: regular heart rate, no murmurs, no JVD  Abdomen: soft, non-tender, no rebound  Extremities: no muscle  wasting, no gross arthritic changes bilateral foot wounds,   GU: not examined  Neurologic: alert and oriented, cranial nerves grossly intact,         Labs:      Lab Results   Component Value Date/Time    NA 136 05/02/2024 05:21 AM    K 4.1 05/02/2024 05:21 AM    CL 99 05/02/2024 05:21 AM    CO2 27 05/02/2024 05:21 AM    BUN 8 05/02/2024 05:21 AM    CREATININE 0.80 05/02/2024 05:21 AM    GLUCOSE 73 05/02/2024 05:21 AM    CALCIUM  7.7 05/02/2024 05:21 AM       Lab Results   Component Value Date    WBC 7.6 05/02/2024    HGB 8.0 (L) 05/02/2024    HCT 24.6 (L) 05/02/2024    MCV 91.1 05/02/2024    PLT 483 (H) 05/02/2024    RBC 2.70 (L) 05/02/2024    MCH 29.6 05/02/2024    MCHC 32.5 05/02/2024    RDW 54.5 (H) 05/02/2024             Additional Data Reviewed:          Xray Result (most recent):  XR FOOT LEFT (2 VIEWS) 04/15/2024    Narrative  Exam: 2 views of the left foot.    Clinical indications:  PAIN right foot. Swelling. Possible osteomyelitis.    Comparison:  None    Findings:  No fracture.  No dislocation. No bone erosions.    No radiopaque foreign bodies.    Impression  Impression:  No fracture.    Electronically signed by: Celine Rear, MD 04/15/2024 12:51 AM EDT  Workstation ID: [id number]                CT Result (most recent):  CTA ABDOMINAL AORTA W BILAT RUNOFF W WO CONTRAST 04/19/2024    Addendum 04/24/2024  5:46 PM  HISTORY: peripheral artery disease    Technique: Noncontrast CT imaging is acquired through the chest for bolus  tracking purposes. Enhanced CT imaging through the abdomen, pelvis, and lower  extremities is performed during arterial phase of IV contrast menstruation as  part of a CTA runoff. Angiographic reconstructions are performed formed at a  separate workstation. The patient received an uneventful IV bolus of 100 mL of  Omnipaque 350 nonionic contrast as part of the study. 3D angiographic post  processing and image rendering was performed by the CT technologist at a  separate workstation  including coronal and sagittal maximal intensity projection  (MIP) reconstructions.  COMPARISON STUDY: none  FINDINGS:  Vascular structures and lower extremities:  Abdominal aorta is normal in caliber and enhancement. Celiac, SMA, and renal  arteries are moderately calcified yet patent with no high-grade stenosis. IMA is  patent. Mild narrowing of the left common iliac artery. The right common iliac  and external iliac arteries are patent. Hypogastric arteries are patent.    On the right, common femoral and profunda are patent. Diffuse mild disease of  the right SFA with moderate grade narrowing in the mid SFA just proximal to the  patent stent. The right popliteal artery is patent. Single-vessel runoff to the  right foot through the peroneal artery. There is gradual diminished enhancement  in the anterior and posterior tibial arteries. The ankle. Ulcerations are  present in the right foot with subcutaneous gas and a vacuum. 2 cm abscess in  the plantar subcutaneous tissues with an air-fluid level noted. Additional 2.4  cm abscess in the dorsal subcutaneous tissues of the mid foot noted with an  air-fluid level.    On the left, common femoral artery is mildly narrowed secondary to plaque and  the profunda is patent. Diffuse moderate disease of the left SFA with partially  calcified plaque. Popliteal artery is patent. Moderate grade stenosis at the  proximal posterior tibial artery. Two-vessel runoff via the posterior tibial and  peroneal arteries. Diminished enhancement noted in the distal anterior tibial  artery. Along the lateral aspect of the midfoot there is a 2.5 x 4.6 cm area of  low density which may represent a developing abscess or phlegmon. There is also  a region of edema in the anterior left lower leg at the level of the ankle. At  the level of the knee, there is a irregular shaped fluid collection in the  anterolateral knee just lateral to the tibial tuberosity which measures  approximately 4.2 cm in  diameter.    Bibasilar subsegmental atelectasis. Heart size is borderline enlarged. The liver  has a slight nodular contour. The gallbladder is present and contains excreted  contrast. The spleen, pancreas, and adrenal glands are within normal notes. 2.6  cm left renal cyst. Kidneys are otherwise normal in morphology with no  hydronephrosis.    Appendix and small bowel loops are unremarkable. Diverticulosis is noted  throughout the colon. No free intraperitoneal fluid or air. Uterus contains a  coarsely calcified leiomyoma. No adnexal or pelvic mass. Urinary bladder is  unremarkable.    Osseous structures the pelvis and lumbar spine are intact. No compression  deformities.    IMPRESSION:  1. No aortic inflow stenosis.  2. Moderate grade stenosis of the mid right SFA and patent SFA stent. Patent  right popliteal artery with single vessel runoff to the right foot.  3. Diffuse moderate disease of the left SFA with patent popliteal artery and  diseased two-vessel runoff to the left foot. Moderate grade stenosis in the  proximal left posterior tibial artery.  4. Open wound in the right foot with subcutaneous gas and multiple abscesses in  the mid foot along the plantar surface and dorsal subcutaneous tissues.  5. Poorly defined fluid collections in the left foot and in the subcutaneous  tissues of the anterior left knee which are concerning for additional abscesses.      Electronically signed by: Freda Lanes, MD 04/24/2024 5:46 PM EDT  Workstation ID: [id number]    Narrative  HISTORY: peripheral artery disease    Technique: Noncontrast CT imaging is acquired through the chest for  bolus  tracking purposes. Enhanced CT imaging through the abdomen, pelvis, and lower  extremities is performed during arterial phase of IV contrast menstruation as  part of a CTA runoff. Angiographic reconstructions are performed formed at a  separate workstation. The patient received an uneventful IV bolus of 100 mL of  Omnipaque 350  nonionic contrast as part of the study.    COMPARISON STUDY: none  FINDINGS:  Vascular structures and lower extremities:  Abdominal aorta is normal in caliber and enhancement. Celiac, SMA, and renal  arteries are moderately calcified yet patent with no high-grade stenosis. IMA is  patent. Mild narrowing of the left common iliac artery. The right common iliac  and external iliac arteries are patent. Hypogastric arteries are patent.    On the right, common femoral and profunda are patent. Diffuse mild disease of  the right SFA with moderate grade narrowing in the mid SFA just proximal to the  patent stent. The right popliteal artery is patent. Single-vessel runoff to the  right foot through the peroneal artery. There is gradual diminished enhancement  in the anterior and posterior tibial arteries. The ankle. Ulcerations are  present in the right foot with subcutaneous gas and a vacuum. 2 cm abscess in  the plantar subcutaneous tissues with an air-fluid level noted. Additional 2.4  cm abscess in the dorsal subcutaneous tissues of the mid foot noted with an  air-fluid level.    On the left, common femoral artery is mildly narrowed secondary to plaque and  the profunda is patent. Diffuse moderate disease of the left SFA with partially  calcified plaque. Popliteal artery is patent. Moderate grade stenosis at the  proximal posterior tibial artery. Two-vessel runoff via the posterior tibial and  peroneal arteries. Diminished enhancement noted in the distal anterior tibial  artery. Along the lateral aspect of the midfoot there is a 2.5 x 4.6 cm area of  low density which may represent a developing abscess or phlegmon. There is also  a region of edema in the anterior left lower leg at the level of the ankle. At  the level of the knee, there is a irregular shaped fluid collection in the  anterolateral knee just lateral to the tibial tuberosity which measures  approximately 4.2 cm in diameter.    Bibasilar subsegmental  atelectasis. Heart size is borderline enlarged. The liver  has a slight nodular contour. The gallbladder is present and contains excreted  contrast. The spleen, pancreas, and adrenal glands are within normal notes. 2.6  cm left renal cyst. Kidneys are otherwise normal in morphology with no  hydronephrosis.    Appendix and small bowel loops are unremarkable. Diverticulosis is noted  throughout the colon. No free intraperitoneal fluid or air. Uterus contains a  coarsely calcified leiomyoma. No adnexal or pelvic mass. Urinary bladder is  unremarkable.    Osseous structures the pelvis and lumbar spine are intact. No compression  deformities.    Impression  IMPRESSION:  1. No aortic inflow stenosis.  2. Moderate grade stenosis of the mid right SFA and patent SFA stent. Patent  right popliteal artery with single vessel runoff to the right foot.  3. Diffuse moderate disease of the left SFA with patent popliteal artery and  diseased two-vessel runoff to the left foot. Moderate grade stenosis in the  proximal left posterior tibial artery.  4. Open wound in the right foot with subcutaneous gas and multiple abscesses in  the mid foot along the plantar surface and dorsal subcutaneous tissues.  5. Poorly defined fluid collections in the left foot and in the subcutaneous  tissues of the anterior left knee which are concerning for additional abscesses.      Electronically signed by: Daryle Darden, MD 04/19/2024 2:45 PM EDT  Workstation ID: [id number]             Condition:     Stable  Diet:   Cardiac diet  Disposition:    [] Home   [] Home with Home Health   [x] SNF/NH   [] Rehab   [] Home with family   [] Alternate Facility:____________________      Discharge Medications:          Medication List        START taking these medications      aspirin  81 MG EC tablet  Take 1 tablet by mouth daily     cefepime  infusion  Commonly known as: MAXIPIME   Infuse 1,000 mg intravenously in the morning and 1,000 mg at noon and 1,000 mg in the  evening. Do all this for 28 days. Compound per protocol.     docusate 100 MG Caps  Commonly known as: COLACE, DULCOLAX  Take 100 mg by mouth 2 times daily as needed for Constipation     ipratropium 0.5 mg-albuterol  2.5 mg 0.5-2.5 (3) MG/3ML Soln nebulizer solution  Commonly known as: DUONEB   Inhale 3 mLs into the lungs every 4 hours as needed for Shortness of Breath     lidocaine  PF 1 % Soln injection  Inject 1 mL into the skin once for 1 dose     metroNIDAZOLE  500 MG/100ML Soln IVPB  Commonly known as: FLAGYL   Infuse 500 mg intravenously in the morning and 500 mg at noon and 500 mg in the evening. Do all this for 14 days.     oxyCODONE -acetaminophen  7.5-325 MG per tablet  Commonly known as: PERCOCET   Take 1 tablet by mouth every 4 hours as needed for Pain for up to 5 days. Max Daily Amount: 6 tablets     senna 8.6 MG tablet  Commonly known as: SENOKOT  Take 1 tablet by mouth daily as needed (constipation)     sodium hypochlorite 0.125 % Soln external solution  Commonly known as: DAKINS  Apply topically daily            CHANGE how you take these medications      furosemide  20 MG tablet  Commonly known as: LASIX   Take 1 tablet by mouth daily  What changed:   medication strength  how much to take     QUEtiapine  100 MG tablet  Commonly known as: SEROQUEL   Take 1 tablet by mouth nightly  What changed: when to take this            CONTINUE taking these medications      amLODIPine  5 MG tablet  Commonly known as: NORVASC   Take 1 tablet by mouth daily     atorvastatin  80 MG tablet  Commonly known as: LIPITOR   Take 1 tablet by mouth nightly     carvedilol  12.5 MG tablet  Commonly known as: COREG   Take 1 tablet by mouth 2 times daily (with meals)     clopidogrel  75 MG tablet  Commonly known as: PLAVIX   Take 1 tablet by mouth daily     Myrbetriq  25 MG Tb24  Generic drug: mirabegron   Take 1 tablet by mouth daily     pantoprazole  40 MG tablet  Commonly known as: PROTONIX   Take 1 tablet by mouth  daily     potassium chloride  10  MEQ extended release tablet  Commonly known as: KLOR-CON   Take 1 tablet by mouth daily with food     Trelegy Ellipta  200-62.5-25 MCG/ACT Aepb inhaler  Generic drug: fluticasone-umeclidin-vilant  Inhale 1 puff into the lungs daily     valsartan  160 MG tablet  Commonly known as: DIOVAN   Take 1 tablet by mouth 2 times daily            STOP taking these medications      Gemtesa 75 MG Tabs tablet  Generic drug: vibegron     Vraylar 1.5 MG capsule  Generic drug: cariprazine hcl               Where to Get Your Medications        Information about where to get these medications is not yet available    Ask your nurse or doctor about these medications  amLODIPine  5 MG tablet  aspirin  81 MG EC tablet  atorvastatin  80 MG tablet  carvedilol  12.5 MG tablet  cefepime  infusion  clopidogrel  75 MG tablet  docusate 100 MG Caps  furosemide  20 MG tablet  ipratropium 0.5 mg-albuterol  2.5 mg 0.5-2.5 (3) MG/3ML Soln nebulizer solution  lidocaine  PF 1 % Soln injection  metroNIDAZOLE  500 MG/100ML Soln IVPB  Myrbetriq  25 MG Tb24  oxyCODONE -acetaminophen  7.5-325 MG per tablet  pantoprazole  40 MG tablet  potassium chloride  10 MEQ extended release tablet  QUEtiapine  100 MG tablet  senna 8.6 MG tablet  sodium hypochlorite 0.125 % Soln external solution  Trelegy Ellipta  200-62.5-25 MCG/ACT Aepb inhaler  valsartan  160 MG tablet            Follow-up Appointments and instructions:   Your PCP: To, Garnette RAMAN, MD, within 3-5 days    Follow-up Information       Follow up With Specialties Details Why Contact Info    [facility]   [address]  [place]  [place]  [address]  [phone]    Meade Viviane RAMAN, MD Infectious Diseases Follow up in 2 week(s)  [address] [facility] [place] [address]  [phone]      Anastasio Adine LABOR, DPM Podiatry, Podiatry Follow up in 2 week(s)  [address]  [address]  [place] [address]  [phone]                        Consults:  Podiatry, orthopedic, and ID and vascular surgery    Treatment Team: Treatment Team:   Onita Pill, MD  Delfino Harlene BRAVO, DPM  Anastasio Adine LABOR, DPM  Desai, Aarti S, MD  Post, Susan Fear, Munjor, DPM  Leblanc, Kenyona D, RN  Onita Pill, MD  Sheela Adriana BROCKS., PT  Mathew Jeannett RAMAN, RN  Rana Wade DASEN, RN    Significant Diagnostic Studies:     Please follow-up on tests/labs that are still pending:    Thank you Dr To, Garnette RAMAN, MD for allowing us  to participate in the care of Susan [name]     [facility] medical dictation software was used for portions of this report.  Unintended voice transcription errors may have occurred.)      >42 minutes spent coordinating this discharge (review instructions/follow-up, prescriptions)    Signed:    [facility]  PILL ONITA, MD  05/02/2024  8:51 AM

## 2024-05-06 LAB — CULTURE, ANAEROBIC

## 2024-05-06 LAB — CULTURE, BODY FLUID (WITH GRAM STAIN): Culture Result: NO GROWTH

## 2024-05-08 ENCOUNTER — Inpatient Hospital Stay: Admit: 2024-05-08 | Payer: MEDICARE | Primary: Family Medicine

## 2024-05-08 ENCOUNTER — Inpatient Hospital Stay: Admit: 2024-05-08 | Payer: Medicare (Managed Care) | Primary: Family Medicine

## 2024-05-08 ENCOUNTER — Encounter

## 2024-05-08 DIAGNOSIS — M009 Pyogenic arthritis, unspecified: Principal | ICD-10-CM

## 2024-05-09 ENCOUNTER — Inpatient Hospital Stay: Admit: 2024-05-09 | Payer: MEDICARE | Primary: Family Medicine

## 2024-05-09 DIAGNOSIS — M86162 Other acute osteomyelitis, left tibia and fibula: Principal | ICD-10-CM

## 2024-05-09 MED ORDER — IOPAMIDOL 61 % IV SOLN
61 | Freq: Once | INTRAVENOUS | Status: AC | PRN
Start: 2024-05-09 — End: 2024-05-09
  Administered 2024-05-09: 22:00:00 85 mL via INTRAVENOUS

## 2024-05-09 MED FILL — ISOVUE-300 61 % IV SOLN: 61 % | INTRAVENOUS | Qty: 85 | Fill #0

## 2024-05-11 LAB — CULTURE, WOUND W GRAM STAIN: Culture Result: NO GROWTH

## 2024-05-22 ENCOUNTER — Inpatient Hospital Stay: Admit: 2024-05-22 | Payer: Medicare (Managed Care) | Primary: Family Medicine

## 2024-05-27 ENCOUNTER — Encounter

## 2024-05-27 ENCOUNTER — Inpatient Hospital Stay
Admit: 2024-05-27 | Payer: Medicare (Managed Care) | Attending: Student in an Organized Health Care Education/Training Program | Primary: Family Medicine

## 2024-05-27 DIAGNOSIS — I70244 Atherosclerosis of native arteries of left leg with ulceration of heel and midfoot: Principal | ICD-10-CM

## 2024-05-29 LAB — CULTURE, FUNGUS: Microscopic Observation: NONE SEEN

## 2024-07-02 LAB — CULTURE WITH SMEAR, ACID FAST BACILLIUS

## 2024-07-15 ENCOUNTER — Encounter

## 2024-07-16 ENCOUNTER — Ambulatory Visit: Payer: Medicare (Managed Care) | Primary: Family Medicine

## 2024-07-22 ENCOUNTER — Encounter

## 2024-07-30 ENCOUNTER — Inpatient Hospital Stay
Admit: 2024-07-30 | Payer: Medicare (Managed Care) | Attending: Student in an Organized Health Care Education/Training Program | Primary: Family Medicine

## 2024-07-30 DIAGNOSIS — I70244 Atherosclerosis of native arteries of left leg with ulceration of heel and midfoot: Principal | ICD-10-CM

## 2024-07-30 DIAGNOSIS — I70234 Atherosclerosis of native arteries of right leg with ulceration of heel and midfoot: Secondary | ICD-10-CM

## 2024-08-01 ENCOUNTER — Inpatient Hospital Stay: Admit: 2024-08-01 | Payer: Medicare (Managed Care) | Primary: Family Medicine

## 2024-08-01 DIAGNOSIS — L97515 Non-pressure chronic ulcer of other part of right foot with muscle involvement without evidence of necrosis: Principal | ICD-10-CM

## 2024-08-08 ENCOUNTER — Inpatient Hospital Stay: Admit: 2024-08-08 | Payer: Medicare (Managed Care) | Primary: Family Medicine

## 2024-08-08 DIAGNOSIS — L97822 Non-pressure chronic ulcer of other part of left lower leg with fat layer exposed: Principal | ICD-10-CM

## 2024-08-15 ENCOUNTER — Encounter: Primary: Family Medicine

## 2024-08-15 ENCOUNTER — Inpatient Hospital Stay: Admit: 2024-08-15 | Payer: Medicare (Managed Care) | Primary: Family Medicine

## 2024-08-15 DIAGNOSIS — I70234 Atherosclerosis of native arteries of right leg with ulceration of heel and midfoot: Principal | ICD-10-CM

## 2024-08-27 ENCOUNTER — Emergency Department: Payer: Medicare (Managed Care) | Primary: Family Medicine

## 2024-08-27 ENCOUNTER — Emergency Department: Admit: 2024-08-27 | Payer: Medicare (Managed Care) | Primary: Family Medicine

## 2024-08-27 ENCOUNTER — Inpatient Hospital Stay
Admission: EM | Admit: 2024-08-27 | Discharge: 2024-09-02 | Disposition: A | Discharge status: Home Health | Payer: Medicare (Managed Care) | Attending: Internal Medicine | Admitting: Internal Medicine

## 2024-08-27 DIAGNOSIS — I959 Hypotension, unspecified: Principal | ICD-10-CM

## 2024-08-27 DIAGNOSIS — A419 Sepsis, unspecified organism: Secondary | ICD-10-CM

## 2024-08-27 LAB — POCT GLUCOSE: POC Glucose: 129 mg/dL — ABNORMAL HIGH (ref 65–105)

## 2024-08-27 LAB — COMPREHENSIVE METABOLIC PANEL
ALT: <7 U/L — ABNORMAL LOW (ref 10–49)
AST: 16 U/L (ref 0.0–33.9)
Albumin: 2.6 g/dL — ABNORMAL LOW (ref 3.4–5.0)
Alkaline Phosphatase: 81 U/L (ref 46–116)
Anion Gap: 9 mmol/L (ref 5–15)
BUN: 27 mg/dL — ABNORMAL HIGH (ref 9–23)
CO2: 28 meq/L (ref 20–31)
Calcium: 9.5 mg/dL (ref 8.7–10.4)
Chloride: 95 meq/L — ABNORMAL LOW (ref 98–107)
Creatinine: 2.49 mg/dL — ABNORMAL HIGH (ref 0.55–1.02)
GFR African American: 24
GFR Non-African American: 20
Glucose: 119 mg/dL — ABNORMAL HIGH (ref 74–106)
Potassium: 4.5 meq/L (ref 3.5–5.1)
Sodium: 132 meq/L — ABNORMAL LOW (ref 136–145)
Total Bilirubin: 0.4 mg/dL (ref 0.30–1.20)
Total Protein: 8.5 g/dL — ABNORMAL HIGH (ref 5.7–8.2)

## 2024-08-27 LAB — CBC WITH AUTO DIFFERENTIAL
Basophils: 0.3 % (ref 0–3)
Eosinophils: 0.7 % (ref 0–5)
Hematocrit: 27.4 % — ABNORMAL LOW (ref 35.0–47.0)
Hemoglobin: 8.8 g/dL — ABNORMAL LOW (ref 11.0–16.0)
Immature Granulocytes %: 0.3 % (ref 0.0–3.0)
Lymphocytes: 21.3 % — ABNORMAL LOW (ref 28–48)
MCH: 29.3 pg (ref 25.4–34.6)
MCHC: 32.1 g/dL (ref 30.0–36.0)
MCV: 91.3 fL (ref 80.0–98.0)
MPV: 9.4 fL (ref 6.0–10.0)
Monocytes: 8.9 % (ref 1–13)
Neutrophils Segmented: 68.5 % — ABNORMAL HIGH (ref 34–64)
Nucleated RBCs: 0 (ref 0–0)
Platelets: 446 1000/mm3 (ref 140–450)
RBC: 3 M/uL — ABNORMAL LOW (ref 3.60–5.20)
RDW: 51.5 — ABNORMAL HIGH (ref 36.4–46.3)
WBC: 7.1 1000/mm3 (ref 4.0–11.0)

## 2024-08-27 LAB — TROPONIN
Troponin, High Sensitivity: 5 ng/L (ref 0–34)
Troponin, High Sensitivity: 4 ng/L (ref 0–34)

## 2024-08-27 LAB — PROCALCITONIN: Procalcitonin: 0.2 ng/mL (ref 0.00–0.50)

## 2024-08-27 LAB — EKG 12-LEAD
Atrial Rate: 80 {beats}/min
Calculated P Axis: 52 degrees
Calculated R Axis: 37 degrees
Calculated T Axis: 34 degrees
DIAGNOSIS, 93000: NORMAL
P-R Interval: 174 ms
Q-T Interval: 378 ms
QRS Duration: 76 ms
QTC Calculation (Bezet): 435 ms
Ventricular Rate: 80 {beats}/min

## 2024-08-27 LAB — LACTATE, SEPSIS: Lactate: 1.5 mmol/L (ref 0.5–2.2)

## 2024-08-27 MED ORDER — CARVEDILOL 6.25 MG PO TABS
6.25 | Freq: Two times a day (BID) | ORAL | Status: DC
Start: 2024-08-27 — End: 2024-08-27

## 2024-08-27 MED ORDER — TIOTROPIUM BROMIDE 2.5 MCG/ACT IN AERS
2.5 | Freq: Every day | RESPIRATORY_TRACT | Status: DC
Start: 2024-08-27 — End: 2024-09-02
  Administered 2024-08-27 – 2024-08-31 (×5): 2 via RESPIRATORY_TRACT

## 2024-08-27 MED ORDER — FLUTICASONE FUROATE-VILANTEROL 100-25 MCG/ACT IN AEPB
100-25 | Freq: Every day | RESPIRATORY_TRACT | Status: DC
Start: 2024-08-27 — End: 2024-09-02
  Administered 2024-08-27 – 2024-08-31 (×5): 1 via RESPIRATORY_TRACT

## 2024-08-27 MED ORDER — ACETAMINOPHEN 650 MG RE SUPP
650 | Freq: Four times a day (QID) | RECTAL | Status: DC | PRN
Start: 2024-08-27 — End: 2024-09-02

## 2024-08-27 MED ORDER — BUDESONIDE-FORMOTEROL FUMARATE 80-4.5 MCG/ACT IN AERO
80-4.5 | Freq: Two times a day (BID) | RESPIRATORY_TRACT | Status: DC
Start: 2024-08-27 — End: 2024-08-27

## 2024-08-27 MED ORDER — ALBUMIN HUMAN 25 % IV SOLN
25 | Freq: Four times a day (QID) | INTRAVENOUS | Status: AC
Start: 2024-08-27 — End: 2024-08-27
  Administered 2024-08-27 – 2024-08-28 (×2): 25 g via INTRAVENOUS

## 2024-08-27 MED ORDER — NORMAL SALINE FLUSH 0.9 % IV SOLN
0.9 | INTRAVENOUS | Status: DC | PRN
Start: 2024-08-27 — End: 2024-09-02

## 2024-08-27 MED ORDER — ONDANSETRON 4 MG PO TBDP
4 | Freq: Three times a day (TID) | ORAL | Status: DC | PRN
Start: 2024-08-27 — End: 2024-09-02

## 2024-08-27 MED ORDER — ENOXAPARIN SODIUM 30 MG/0.3ML IJ SOSY
30 | Freq: Every day | INTRAMUSCULAR | Status: DC
Start: 2024-08-27 — End: 2024-09-02
  Administered 2024-08-28 – 2024-09-02 (×6): 30 mg via SUBCUTANEOUS

## 2024-08-27 MED ORDER — SODIUM CHLORIDE 0.9 % IV BOLUS
0.9 | Freq: Once | INTRAVENOUS | Status: AC
Start: 2024-08-27 — End: 2024-08-27
  Administered 2024-08-27: 19:00:00 1000 mL via INTRAVENOUS

## 2024-08-27 MED ORDER — VANCOMYCIN INTERMITTENT DOSING (PLACEHOLDER)
INTRAVENOUS | Status: DC
Start: 2024-08-27 — End: 2024-09-02

## 2024-08-27 MED ORDER — STERILE WATER FOR INJECTION (MIXTURES ONLY)
2 | Freq: Once | INTRAVENOUS | Status: AC
Start: 2024-08-27 — End: 2024-08-27
  Administered 2024-08-27: 19:00:00 2000 mg via INTRAVENOUS

## 2024-08-27 MED ORDER — CARVEDILOL 6.25 MG PO TABS
6.25 | Freq: Two times a day (BID) | ORAL | Status: DC
Start: 2024-08-27 — End: 2024-08-30

## 2024-08-27 MED ORDER — MIDODRINE HCL 5 MG PO TABS
5 | Freq: Three times a day (TID) | ORAL | Status: DC
Start: 2024-08-27 — End: 2024-09-02
  Administered 2024-08-28 – 2024-08-30 (×5): 10 mg via ORAL

## 2024-08-27 MED ORDER — MIDODRINE HCL 5 MG PO TABS
5 | Freq: Once | ORAL | Status: AC
Start: 2024-08-27 — End: 2024-08-27
  Administered 2024-08-27: 21:00:00 10 mg via ORAL

## 2024-08-27 MED ORDER — CLOPIDOGREL BISULFATE 75 MG PO TABS
75 | Freq: Every day | ORAL | Status: DC
Start: 2024-08-27 — End: 2024-09-02
  Administered 2024-08-27 – 2024-09-02 (×7): 75 mg via ORAL

## 2024-08-27 MED ORDER — SODIUM CHLORIDE 0.9 % IV SOLN
0.9 | INTRAVENOUS | Status: DC | PRN
Start: 2024-08-27 — End: 2024-09-02

## 2024-08-27 MED ORDER — PANTOPRAZOLE SODIUM 40 MG PO TBEC
40 | Freq: Every day | ORAL | Status: DC
Start: 2024-08-27 — End: 2024-09-02
  Administered 2024-08-28 – 2024-09-02 (×6): 40 mg via ORAL

## 2024-08-27 MED ORDER — POLYETHYLENE GLYCOL 3350 17 G PO PACK
17 | Freq: Every day | ORAL | Status: DC | PRN
Start: 2024-08-27 — End: 2024-09-02
  Administered 2024-08-30: 17:00:00 17 g via ORAL

## 2024-08-27 MED ORDER — MIRABEGRON ER 25 MG PO TB24
25 | Freq: Every day | ORAL | Status: DC
Start: 2024-08-27 — End: 2024-09-02
  Administered 2024-08-28 – 2024-09-02 (×6): 25 mg via ORAL

## 2024-08-27 MED ORDER — ONDANSETRON HCL 4 MG/2ML IJ SOLN
4 | Freq: Four times a day (QID) | INTRAMUSCULAR | Status: DC | PRN
Start: 2024-08-27 — End: 2024-09-02
  Administered 2024-08-29 – 2024-08-31 (×4): 4 mg via INTRAVENOUS

## 2024-08-27 MED ORDER — NORMAL SALINE FLUSH 0.9 % IV SOLN
0.9 | Freq: Two times a day (BID) | INTRAVENOUS | Status: DC
Start: 2024-08-27 — End: 2024-09-02
  Administered 2024-08-28 (×2): 10 mL via INTRAVENOUS
  Administered 2024-08-29: 14:00:00 5 mL via INTRAVENOUS
  Administered 2024-08-29 – 2024-09-02 (×9): 10 mL via INTRAVENOUS

## 2024-08-27 MED ORDER — CEFEPIME HCL 2 G IV SOLR
2 | INTRAVENOUS | Status: DC
Start: 2024-08-27 — End: 2024-08-27

## 2024-08-27 MED ORDER — IPRATROPIUM-ALBUTEROL 0.5-2.5 (3) MG/3ML IN SOLN
0.5-2.5 | RESPIRATORY_TRACT | Status: DC | PRN
Start: 2024-08-27 — End: 2024-09-02

## 2024-08-27 MED ORDER — SODIUM CHLORIDE 0.9 % IV BOLUS
0.9 | Freq: Once | INTRAVENOUS | Status: AC
Start: 2024-08-27 — End: 2024-08-27
  Administered 2024-08-27: 18:00:00 1000 mL via INTRAVENOUS

## 2024-08-27 MED ORDER — SODIUM CHLORIDE 0.9 % IV SOLN (MINI-BAG)
0.9 | Freq: Once | INTRAVENOUS | Status: DC
Start: 2024-08-27 — End: 2024-08-27

## 2024-08-27 MED ORDER — ASPIRIN 81 MG PO TBEC
81 | Freq: Every day | ORAL | Status: DC
Start: 2024-08-27 — End: 2024-09-02
  Administered 2024-08-28 – 2024-09-02 (×6): 81 mg via ORAL

## 2024-08-27 MED ORDER — VANCOMYCIN 1500 MG NS 500 ML (PREMIX) IVPB
Freq: Once | Status: AC
Start: 2024-08-27 — End: 2024-08-27
  Administered 2024-08-27: 20:00:00 1500 mg via INTRAVENOUS

## 2024-08-27 MED ORDER — VALSARTAN 80 MG PO TABS
80 | Freq: Two times a day (BID) | ORAL | Status: DC
Start: 2024-08-27 — End: 2024-09-02

## 2024-08-27 MED ORDER — ATORVASTATIN CALCIUM 40 MG PO TABS
40 | Freq: Every evening | ORAL | Status: DC
Start: 2024-08-27 — End: 2024-09-02
  Administered 2024-08-28 – 2024-09-02 (×6): 80 mg via ORAL

## 2024-08-27 MED ORDER — ACETAMINOPHEN 325 MG PO TABS
325 | Freq: Four times a day (QID) | ORAL | Status: DC | PRN
Start: 2024-08-27 — End: 2024-09-02
  Administered 2024-08-28 – 2024-08-31 (×4): 650 mg via ORAL

## 2024-08-27 MED ORDER — AMLODIPINE BESYLATE 5 MG PO TABS
5 | Freq: Every day | ORAL | Status: DC
Start: 2024-08-27 — End: 2024-08-30

## 2024-08-27 MED ORDER — VANCOMYCIN INTERMITTENT DOSING (PLACEHOLDER)
Freq: Once | INTRAVENOUS | Status: DC
Start: 2024-08-27 — End: 2024-08-30

## 2024-08-27 MED ORDER — SODIUM CHLORIDE 0.9 % IV SOLN (MINI-BAG)
0.9 | INTRAVENOUS | Status: DC
Start: 2024-08-27 — End: 2024-08-30
  Administered 2024-08-28 – 2024-08-29 (×2): 1000 mg via INTRAVENOUS

## 2024-08-27 MED FILL — MYRBETRIQ 25 MG PO TB24: 25 mg | ORAL | Qty: 1 | Fill #0

## 2024-08-27 MED FILL — CEFEPIME HCL 2 G IV SOLR: 2 g | INTRAVENOUS | Qty: 2 | Fill #0

## 2024-08-27 MED FILL — VANCOMYCIN 1500 MG NS 500 ML (PREMIX) IVPB: Qty: 500 | Fill #0

## 2024-08-27 MED FILL — ALBUKED 25 25 % IV SOLN: 25 % | INTRAVENOUS | Qty: 100 | Fill #0

## 2024-08-27 MED FILL — BREO ELLIPTA 100-25 MCG/ACT IN AEPB: 100-25 MCG/ACT | RESPIRATORY_TRACT | Qty: 28 | Fill #0

## 2024-08-27 MED FILL — CLOPIDOGREL BISULFATE 75 MG PO TABS: 75 mg | ORAL | Qty: 1 | Fill #0

## 2024-08-27 MED FILL — SPIRIVA RESPIMAT 2.5 MCG/ACT IN AERS: 2.5 MCG/ACT | RESPIRATORY_TRACT | Qty: 4 | Fill #0

## 2024-08-27 MED FILL — MIDODRINE HCL 5 MG PO TABS: 5 mg | ORAL | Qty: 2 | Fill #0

## 2024-08-27 NOTE — ED Notes (Signed)
"  Patient brought back to room 30, patient placed on cardiac monitor. Patient started on IV fluids for low BP, patient blood cultures and EKG obtained. Patient has wounds to both feet and documented and shown to MD. Patient foot wounds rewrapped.      Joesph Rosalynn LABOR, RN  08/27/24 1414    "

## 2024-08-27 NOTE — ED Notes (Signed)
"  TRANSFER - OUT REPORT:    Verbal report given to [facility] nurse on Susan [name] being transferred to [id number] for routine progression of patient care       Report consisted of patient's Situation, Background, Assessment and   Recommendations(SBAR).     Information from the following report(s) Nurse Handoff Report, Index, ED Encounter Summary, ED SBAR, Adult Overview, MAR, Recent Results, and Neuro Assessment was reviewed with the receiving nurse.  Kinder Assessment: Presents to emergency department  because of falls (Syncope, seizure, or loss of consciousness): No, Age > 70: Yes, Altered Mental Status, Intoxication with alcohol or substance confusion (Disorientation, impaired judgment, poor safety awaremess, or inability to follow instructions): Yes, Impaired Mobility: Ambulates or transfers with assistive devices or assistance; Unable to ambulate or transer.: Yes, Nursing Judgement: Yes  Lines:   Peripheral IV 08/27/24 Right Antecubital (Active)   Site Assessment Clean, dry & intact 08/27/24 1246   Line Status Blood return noted;Normal saline locked;Flushed;Specimen collected 08/27/24 1246   Line Care Connections checked and tightened 08/27/24 1246   Phlebitis Assessment No symptoms 08/27/24 1246   Infiltration Assessment 0 08/27/24 1246   Alcohol Cap Used No 08/27/24 1246   Dressing Status New dressing applied 08/27/24 1246   Dressing Type Transparent 08/27/24 1246   Dressing Intervention New 08/27/24 1246       Peripheral IV 08/27/24 Left Antecubital (Active)        Medication(s) sent with patient from pharmacy and placed in tamper-evident security bag: None to send  If 'Yes,' list the name(s) of the medication(s): n/a    Patient belongings: Belongings sent to floor    Opportunity for questions and clarification was provided.      Patient transported with:  Monitor and Registered Nurse           Joesph Rosalynn LABOR, RN  08/27/24 1934    "

## 2024-08-27 NOTE — Progress Notes (Signed)
"   [facility] Pharmacy Consult: Vancomycin  Dosing - Initial  [name]  73 y.o.  female   Current Allergies. Penicillins   Consult ordered by: Dr. Lonnie   Indication:  Skin & Soft tissue infection (SSTI)   Other Antimicrobials:  Cefepime    Relevant Labs:      Height  Ht Readings from Last 1 Encounters:   08/27/24 1.702 m (5' 7)          Weight Wt Readings from Last 1 Encounters:   08/27/24 63 kg (139 lb)        Serum Creatinine Lab Results   Component Value Date    CREATININE 2.49 (H) 08/27/2024    CREATININE 0.80 05/02/2024       Creatinine Clearance Estimated Creatinine Clearance: 20 mL/min (A) (based on SCr of 2.49 mg/dL (H)).   BUN Lab Results   Component Value Date/Time    BUN 27 08/27/2024 12:42 PM      WBC Lab Results   Component Value Date/Time    WBC 7.1 08/27/2024 12:42 PM      Temp 97.7 F (36.5 C) (Oral)     Assessment/Plan   Loading dose: Vancomycin  1500 mg as a one time dose.    Maintenance dose: Start Vancomycin  dosing per level w/ level tomorrow 11/27 AM labs.   TDM goal: 10-20 mcg/mL  AUC/MIC goal: 400-600 mcg*hr/mL  Vancomycin  level to be collected on: 08/28/2024 prior to the administering additional dose. Pt w/ severe AKI so need to see if pt clearing vancomycin  before scheduling doses. Baseline Scr ~ 0.8    Pharmacy to follow daily and will make changes to dose and/or frequency based on clinical status.  Thanks,  DARREN MCGLAUGHLIN, [facility]   August 27, 2024   [facility]  [phone]     "

## 2024-08-27 NOTE — ED Triage Notes (Signed)
"  Pt arrives ambulatory, placed in hospital [facility] for safety, with c/o hypotension and intermittent lightheadedness this AM. Was checked X2-3 times at doctors this morning and advised to come to ED. Pt normally takes hypertension medications. Followed by wound care.     BP 89/60- MAP 70.   BS- 129.     Past Medical History:   Diagnosis Date    Hyperlipidemia     Hypertension        "

## 2024-08-27 NOTE — ED Notes (Signed)
"  Patient medicated per MAR orders, patient on cardiac monitor. Patient has no requests at this time, patient has call bell within reach.      Joesph Rosalynn LABOR, RN  08/27/24 1827    "

## 2024-08-27 NOTE — H&P (Addendum)
 "Hospitalist Admission History and Physical    NAME:  Susan [name]   DOB:   [birth year]   MRN:   [id number]     PCP:  To, Garnette RAMAN, MD  Admission Date/Time:  08/27/2024 5:10 PM  Anticipated Date of Discharge: 08/30/2024  Anticipated Disposition (home, SNF) : HH         Assessment/Plan:      Principal Problem:    Hypotension  Resolved Problems:    * No resolved hospital problems. *       Assessment /Plan   1.Foot cellulitis   Foot wounds at baseline   BSA   S/p right foot surgery in the past for osteo , has had Left foot abscess , and I/D   Podiatry consulted       2. Hypotension  r/o sepsis   Fluid boluses - 2 L given   IVF   Midodrine    IV albumin    Procal , lactic       3. AKI   IVF     4. COPD   Oxygen , nebs   Keep sat more than 92     5. PAD   Bilateral Angiogram , has had angioplasty  and left sfa with stent       6. Bipolar disorder                 Risk of deterioration:  [] Low    [x] Moderate  [] High    Prophylaxis:  [x] Lovenox   [] Coumadin  [] Hep SQ  [] SCDs  [] H2B/PPI[] Eliquis [] Xarelto     Disposition:  [] Home w/ Family   [x] HH PT,OT,RN   [] SNF/LTC   [] SAH/Rehab       Vent. ratePR intervalQRS durationQTQTcBP-R-T Axis   BPM  ms  ms     Normal sinus rhythm  Normal ECG  When compared with ECG of 14-Apr-2024 20:04,  No significant change was found  Confirmed by Karoline Charleston ([id number]) on 08/27/2024 4:36:01 PM        Right ABI - 0.90 , Digit to brachial index - 0.39       Subjective:   CHIEF COMPLAINT:    Chief Complaint   Patient presents with    Hypotension       HISTORY OF PRESENT ILLNESS:     Susan [name] is a 73 y.o. Black / African American female who presents with hypotension   He has h/o copd with chronic hypoxic resp failure , had admission in August for right foot abscess/osteomyelitis requiring antibiotics and wound VAC at that time    Patient has had some generalized fatigue but otherwise denies any chest pain, shortness of breath, fevers, chills, cough, congestion, abdominal pain, nausea, vomiting   Has  had some chronic leg pain including her right foot , right leg from knee downwards sec to pad   Pt has B/l Foot wounds , followed by wound care   She does have h/o hypertension , is on 3 anti hypertensive         Past Medical History:   Diagnosis Date    Hyperlipidemia     Hypertension         Past Surgical History:   Procedure Laterality Date    LEG SURGERY Right 04/17/2024    RIGHT HEEL IRRIGATION AND DEBRIDEMENT WITH WOUND VAC APPLICATION performed by Rosamond Inches, DPM at [facility] MAIN OR    LEG SURGERY Bilateral 04/25/2024    DEBRIDEMENT RIGHT FOOT WITH GRAFT APPLICATION; APPLICATION OF WOUND VAC;  DRAINAGE OF ABSCESS LEFT FOOT performed by Rosamond Inches, DPM at [facility] MAIN OR       Social History     Tobacco Use    Smoking status: Former     Types: Cigarettes    Smokeless tobacco: Never   Substance Use Topics    Alcohol use: Not Currently        Family History    Family History  Medical History Relation Name Comments   Cancer Father       No Known Problems Mother         Family History  Relation Name Status Comments   Father   Deceased     Mother          Allergies   Allergen Reactions    Penicillins Other (See Comments)     States unknown reaction     Mother told her as a child she was allergic                REVIEW OF SYSTEMS:    As in hpi     Objective:   VITALS:    BP 91/62   Pulse 75   Temp 97.7 F (36.5 C) (Oral)   Resp 14   Ht 1.702 m (5' 7)   Wt 63 kg (139 lb)   SpO2 99%   BMI 21.77 kg/m   Temp (24hrs), Avg:97.7 F (36.5 C), Min:97.7 F (36.5 C), Max:97.7 F (36.5 C)      PHYSICAL EXAM: (Seen with PPE , gloves , gown , mask n95 and goggles )  General:    Chronic sick looking    Head:   Normocephalic, without obvious abnormality, atraumatic.  Eyes:   Conjunctivae clear, anicteric sclerae.  Pupils are equal  Nose:  Nares normal. No drainage or sinus tenderness.  Throat:    Lips, mucosa, and tongue normal.  No Thrush  Neck:  Supple, symmetrical,  no adenopathy, thyroid: non tender    no carotid bruit  and no JVD.  Back:    Symmetric,  No CVA tenderness.  Lungs:   Clear to auscultation bilaterally.  No Wheezing or Rhonchi. No rales.  Chest wall:  No tenderness or deformity. No Accessory muscle use.  Heart:   Regular rate and rhythm,  no murmur, rub or gallop.  Abdomen:   Soft, non-tender. Not distended.  Bowel sounds normal. No masses  Extremities: Wounds In feet B/L , dopplerable pulses , wound right calcaneus , left anterior foot , left lower leg   Small necrotic   Wrapped right foot   Skin:     Texture, turgor normal. No rashes or lesions.  Not Jaundiced  Psych:  Anxious   Neurologic: EOMs intact. No facial asymmetry. No aphasia or slurred speech. Normal    strength, Alert and oriented X 3.                   I discussed the patient with the emergency room physician about the necessity to admit the patient to the hospital    I reviewed patient's history previous chart and laboratory results during this admission while making the diagnosis and plan for admission to the hospital      BSA  medication therapy which will require intensive monitoring     LAB DATA REVIEWED:    CBC:  Recent Labs     08/27/24  1242   WBC 7.1   RBC 3.00*   HGB 8.8*   HCT 27.4*  MCV 91.3   RDW 51.5*   PLT 446     CHEMISTRIES:  Recent Labs     08/27/24  1242   NA 132*   K 4.5   CL 95*   CO2 28   BUN 27*   CREATININE 2.49*   GLUCOSE 119*     PT/INR:No results for input(s): PROTIME, INR in the last 72 hours.  APTT:No results for input(s): APTT in the last 72 hours.  LIVER PROFILE:  Recent Labs     08/27/24  1242   AST 16.0   ALT <7*   BILITOT 0.40   ALKPHOS 81         IMAGING RESULTS:    XR CHEST (2 VW)  Result Date: 08/27/2024  IMPRESSION:  No radiographic evidence of acute cardiopulmonary disease. Electronically signed by: Lynwood Harden, MD 08/27/2024 12:56 PM EST          Workstation ID: [id number]       I have spent 43 minutes of critical care time that involved lab review, consultations with specialist, family decision-making,  and documentation. This time however did not include time spent on doing procedures. During this entire length of time I was immediately available to the patient. The reason for providing this level of medical care for this critically ill patient was due a critical illness that impaired one or more vital organ systems such that there was a high probability of imminent or life threatening deterioration in the patients condition. This care involved high complexity decision making to assess, manipulate, and support vital system functions, to treat vital organ system failure and to prevent further life threatening deterioration of the patient's condition         Care Plan discussed with:     [x] Patient   [] Family    [] ED Care Manager  [] ED Doc   [] Specialist :          ___________________________________________________  Admitting Physician: ALVIRA LOOSEN, MD     Dragon medical dictation software was used for portions of this report.  Unintended voice transcription errors may have occurred.          "

## 2024-08-27 NOTE — ED Provider Notes (Addendum)
 "  [facility]  Emergency Department Treatment Report      Patient: Susan [name] Age: 73 y.o. Sex: female    Date of Birth: Nov 06, 1950 Admit Date: 08/27/2024 PCP: Olympia Garnette RAMAN, MD   MRN: [id number]  CSN: [id number]     Room: ER30/ER30 Time Dictated: 6:16 PM      Chief Complaint   Chief Complaint   Patient presents with    Hypotension       History of Present Illness   73 y.o. female with a past medical history of hypertension, hyperlipidemia, chronic respiratory failure on 2 L of home oxygen, COPD, PAD, admission in August for right foot abscess/osteomyelitis requiring antibiotics and wound VAC at that time, presenting to the emergency department for hypotension.  She has been having blood pressures trending downwards for the last couple of weeks.  She has been on 3 blood pressure medications, taken off amlodipine  about 1 week ago, still on valsartan , carvedilol .  Patient has had some generalized fatigue but otherwise denies any chest pain, shortness of breath, fevers, chills, cough, congestion, abdominal pain, nausea, vomiting.  Since her hospitalization she has had some chronic leg pain including her right foot, right leg from knee downwards which she attributes to her PAD.  Daughter is bedside, states that the wounds to her bilateral foot are being addressed by wound care and appear to be healing well.    Review of Systems   ROS   As outlined in HPI    Past Medical/Surgical History     Past Medical History:   Diagnosis Date    Hyperlipidemia     Hypertension      Past Surgical History:   Procedure Laterality Date    LEG SURGERY Right 04/17/2024    RIGHT HEEL IRRIGATION AND DEBRIDEMENT WITH WOUND VAC APPLICATION performed by Rosamond Inches, DPM at [facility] MAIN OR    LEG SURGERY Bilateral 04/25/2024    DEBRIDEMENT RIGHT FOOT WITH GRAFT APPLICATION; APPLICATION OF WOUND VAC;  DRAINAGE OF ABSCESS LEFT FOOT performed by Rosamond Inches, DPM at [facility] MAIN OR       Social History     Social History      Socioeconomic History    Marital status: Legally Separated     Spouse name: Not on file    Number of children: Not on file    Years of education: Not on file    Highest education level: Not on file   Occupational History    Not on file   Tobacco Use    Smoking status: Former     Types: Cigarettes    Smokeless tobacco: Never   Substance and Sexual Activity    Alcohol use: Not Currently    Drug use: Never    Sexual activity: Not Currently   Other Topics Concern    Not on file   Social History Narrative    Not on file     Social Drivers of Health     Financial Resource Strain: Not on file   Food Insecurity: No Food Insecurity (04/16/2024)    Hunger Vital Sign     Worried About Running Out of Food in the Last Year: Never true     Ran Out of Food in the Last Year: Never true   Transportation Needs: No Transportation Needs (04/16/2024)    PRAPARE - Therapist, Art (Medical): No     Lack of Transportation (Non-Medical): No   Physical  Activity: Not on file   Stress: Not on file   Social Connections: Not on file   Intimate Partner Violence: Not on file   Housing Stability: Low Risk  (04/16/2024)    Housing Stability Vital Sign     Unable to Pay for Housing in the Last Year: No     Number of Times Moved in the Last Year: 0     Homeless in the Last Year: No       Family History   No family history on file.    Current Medications     Current Facility-Administered Medications   Medication Dose Route Frequency Provider Last Rate Last Admin    [Held by provider] amLODIPine  (NORVASC ) tablet 5 mg  5 mg Oral Daily Verma, Sourabh, MD        [START ON 08/28/2024] aspirin  EC tablet 81 mg  81 mg Oral Daily Verma, Sourabh, MD        atorvastatin  (LIPITOR ) tablet 80 mg  80 mg Oral QHS Verma, Sourabh, MD        clopidogrel  (PLAVIX ) tablet 75 mg  75 mg Oral Daily Verma, Sourabh, MD        ipratropium 0.5 mg-albuterol  2.5 mg (DUONEB ) nebulizer solution 1 Dose  1 Dose Inhalation Q4H PRN Verma, Sourabh, MD         NOREEN ON 08/28/2024] mirabegron  (MYRBETRIQ ) extended release tablet 25 mg  25 mg Oral Daily Verma, Sourabh, MD        NOREEN ON 08/28/2024] pantoprazole  (PROTONIX ) tablet 40 mg  40 mg Oral Daily Verma, Sourabh, MD        tiotropium bromide  (SPIRIVA  RESPIMAT) 2.5 MCG/ACT inhaler 2 puff  2 puff Inhalation Daily RT Lonnie Passy, MD        [facility] by provider] valsartan  (DIOVAN ) tablet 160 mg  160 mg Oral BID Verma, Sourabh, MD        sodium chloride  flush 0.9 % injection 5-40 mL  5-40 mL IntraVENous 2 times per day Lonnie Passy, MD        sodium chloride  flush 0.9 % injection 5-40 mL  5-40 mL IntraVENous PRN Lonnie Passy, MD        0.9 % sodium chloride  infusion   IntraVENous PRN Verma, Sourabh, MD        NOREEN ON 08/28/2024] enoxaparin  Sodium (LOVENOX ) injection 30 mg  30 mg SubCUTAneous Daily Verma, Sourabh, MD        ondansetron  (ZOFRAN -ODT) disintegrating tablet 4 mg  4 mg Oral Q8H PRN Lonnie Passy, MD        Or    ondansetron  (ZOFRAN ) injection 4 mg  4 mg IntraVENous Q6H PRN Verma, Sourabh, MD        polyethylene glycol (GLYCOLAX ) packet 17 g  17 g Oral Daily PRN Lonnie Passy, MD        acetaminophen  (TYLENOL ) tablet 650 mg  650 mg Oral Q6H PRN Lonnie Passy, MD        Or    acetaminophen  (TYLENOL ) suppository 650 mg  650 mg Rectal Q6H PRN Lonnie Passy, MD        NOREEN ON 08/28/2024] midodrine  (PROAMATINE ) tablet 10 mg  10 mg Oral TID WC Verma, Sourabh, MD        [START ON 08/28/2024] cefepime  (MAXIPIME ) 1,000 mg in sodium chloride  0.9 % 50 mL IVPB (mini-bag)  1,000 mg IntraVENous Q24H Lonnie Passy, MD        fluticasone  furoate-vilanterol (BREO ELLIPTA ) 100-25 MCG/ACT inhaler 1 puff  1  puff Inhalation Daily RT Lonnie Passy, MD        albumin  human 25% IV solution 25 g  25 g IntraVENous Q6H Lonnie Passy, MD        [START ON 08/28/2024] carvedilol  (COREG ) tablet 6.25 mg  6.25 mg Oral BID WC Lonnie Passy, MD        NOREEN ON 08/28/2024] *Draw vancomycin  trough on 11/27 w/ AM labs @ 0600  1 each  Other Once Lonnie Passy, MD        *Vancomycin  dosing per pharmacy ([facility])  1 each Other RX Placeholder Lonnie Passy, MD         Current Outpatient Medications   Medication Sig Dispense Refill    TRELEGY ELLIPTA  200-62.5-25 MCG/ACT AEPB inhaler Inhale 1 puff into the lungs daily      atorvastatin  (LIPITOR ) 80 MG tablet Take 1 tablet by mouth nightly      valsartan  (DIOVAN ) 160 MG tablet Take 1 tablet by mouth 2 times daily      QUEtiapine  (SEROQUEL ) 100 MG tablet Take 1 tablet by mouth nightly      carvedilol  (COREG ) 12.5 MG tablet Take 1 tablet by mouth 2 times daily (with meals)      amLODIPine  (NORVASC ) 5 MG tablet Take 1 tablet by mouth daily      furosemide  (LASIX ) 20 MG tablet Take 1 tablet by mouth daily      potassium chloride  (KLOR-CON ) 10 MEQ extended release tablet Take 1 tablet by mouth daily with food      clopidogrel  (PLAVIX ) 75 MG tablet Take 1 tablet by mouth daily      pantoprazole  (PROTONIX ) 40 MG tablet Take 1 tablet by mouth daily      MYRBETRIQ  25 MG TB24 Take 1 tablet by mouth daily      docusate sodium  (COLACE, DULCOLAX) 100 MG CAPS Take 100 mg by mouth 2 times daily as needed for Constipation      sodium hypochlorite (DAKINS) 0.125 % SOLN external solution Apply topically daily  2    ipratropium 0.5 mg-albuterol  2.5 mg (DUONEB ) 0.5-2.5 (3) MG/3ML SOLN nebulizer solution Inhale 3 mLs into the lungs every 4 hours as needed for Shortness of Breath      aspirin  81 MG EC tablet Take 1 tablet by mouth daily         Allergies     Allergies   Allergen Reactions    Penicillins Other (See Comments)     States unknown reaction     Mother told her as a child she was allergic       Physical Exam     ED Triage Vitals [08/27/24 1207]   BP Girls Systolic BP Percentile Girls Diastolic BP Percentile Boys Systolic BP Percentile Boys Diastolic BP Percentile Temp Temp Source Pulse   (!) 89/60 -- -- -- -- 97.7 F (36.5 C) Oral 74      Respirations SpO2 Height Weight       16 100 % 1.702 m (5' 7) --           Physical Exam  Vitals and nursing note reviewed.   Constitutional:       Appearance: Normal appearance.      Comments: Chronically ill-appearing   HENT:      Head: Normocephalic and atraumatic.   Cardiovascular:      Rate and Rhythm: Normal rate and regular rhythm.      Pulses: Normal pulses.      Heart sounds: Normal heart sounds.  No murmur heard.     No friction rub. No gallop.   Pulmonary:      Effort: Pulmonary effort is normal. No respiratory distress.      Breath sounds: Normal breath sounds. No wheezing or rales.   Abdominal:      General: Abdomen is flat. There is no distension.      Palpations: Abdomen is soft.      Tenderness: There is no abdominal tenderness. There is no guarding.   Musculoskeletal:         General: Normal range of motion.      Cervical back: Normal range of motion.   Skin:     General: Skin is warm.      Capillary Refill: Capillary refill takes less than 2 seconds.      Comments: , Wounds present to bilateral feet dopplerable pulses to DP region   Neurological:      General: No focal deficit present.      Mental Status: She is alert. Mental status is at baseline.                     Impression and Management Plan   73 year old female presenting to the emergency department for hypotension.     Considered sepsis, infection, dehydration, AKI, electrolyte abnormality, medication side effect, UTI, cellulitis, osteomyelitis, etc.    Will obtain sepsis workup, start on fluids, broadly covered with antibiotics, reassess.  CBC without leukocytosis, chronic anemia noted with a hemoglobin of 8.8.  CMP with a significant AKI, creatinine to 2.49 from a normal baseline.  Mild hyponatremia noted to 132.  Lactic, procalcitonin normal.  EKG nonischemic.  Troponin normal.  Blood cultures collected, pending urinalysis.  Chest x-ray unremarkable.  No localizing symptoms, no chest pain, cough, shortness of breath, abdominal pain, do not think we need CT imaging at this time of chest abdomen  pelvis.  Patient with easily dopplerable pulses to bilateral legs in the setting of PAD, wounds noted, see picture above, appear to be superficial skin, does not appear infected, no purulent drainage.    Blood pressure is improving with fluids here, also given dose of midodrine .  Will admit for further workup and management given her ongoing hypotension and AKI.  Discussed with Dr. Lonnie who is agreeable for admission for further workup and management.  We discussed patient's ongoing symptoms.     Attempted to reach patient's podiatrist, Dr. Arthor Fear, have not received a callback from podiatry group.      Diagnostic Studies   Lab:   Results for orders placed or performed during the hospital encounter of 08/27/24   Culture, Blood 2    Specimen: Blood    Narrative    Source:->Blood   XR CHEST (2 VW)    Narrative    CLINICAL INDICATION: Chest pain Pain.    TECHNIQUE:  PA and lateral chest radiographs.    FINDINGS:      The heart size is within normal limits. The mediastinal contours are  unremarkable.    The lungs are clear and the costophrenic angles are sharp.    The osseous structures of the thorax are intact. There is a disc space narrowing  and bridging osteophytes at several levels in the thoracic spine.    There is no significant change compared with the 04/15/2024 chest x-ray.      Impression    IMPRESSION:      No radiographic evidence of acute cardiopulmonary disease.    Electronically  signed by: Lynwood Harden, MD 08/27/2024 12:56 PM EST            Workstation ID: [id number]     Comprehensive Metabolic Panel   Result Value Ref Range    Potassium 4.5 3.5 - 5.1 mEq/L    Chloride 95 (L) 98 - 107 mEq/L    Sodium 132 (L) 136 - 145 mEq/L    CO2 28 20 - 31 mEq/L    Glucose 119 (H) 74 - 106 mg/dl    BUN 27 (H) 9 - 23 mg/dl    Creatinine 7.50 (H) 0.55 - 1.02 mg/dl    GFR African American 24.0      GFR Non-African American 20      Calcium  9.5 8.7 - 10.4 mg/dl    Anion Gap 9 5 - 15 mmol/L    AST 16.0 0.0 - 33.9 U/L     ALT <7 (L) 10 - 49 U/L    Alkaline Phosphatase 81 46 - 116 U/L    Total Bilirubin 0.40 0.30 - 1.20 mg/dl    Total Protein 8.5 (H) 5.7 - 8.2 gm/dl    Albumin  2.6 (L) 3.4 - 5.0 gm/dl   CBC with Auto Differential   Result Value Ref Range    WBC 7.1 4.0 - 11.0 1000/mm3    RBC 3.00 (L) 3.60 - 5.20 M/uL    Hemoglobin 8.8 (L) 11.0 - 16.0 gm/dl    Hematocrit 72.5 (L) 35.0 - 47.0 %    MCV 91.3 80.0 - 98.0 fL    MCH 29.3 25.4 - 34.6 pg    MCHC 32.1 30.0 - 36.0 gm/dl    Platelets 553 859 - 450 1000/mm3    MPV 9.4 6.0 - 10.0 fL    RDW 51.5 (H) 36.4 - 46.3      Nucleated RBCs 0 0 - 0      Immature Granulocytes % 0.3 0.0 - 3.0 %    Neutrophils Segmented 68.5 (H) 34 - 64 %    Lymphocytes 21.3 (L) 28 - 48 %    Monocytes 8.9 1 - 13 %    Eosinophils 0.7 0 - 5 %    Basophils 0.3 0 - 3 %   Troponin   Result Value Ref Range    Troponin, High Sensitivity 5 0 - 34 ng/L   Troponin   Result Value Ref Range    Troponin, High Sensitivity 4 0 - 34 ng/L   Procalcitonin   Result Value Ref Range    Procalcitonin 0.20 0.00 - 0.50 ng/ml   Lactate, Sepsis   Result Value Ref Range    Lactate 1.5 0.5 - 2.2 mmol/L   POCT Glucose   Result Value Ref Range    POC Glucose 129 (H) 65 - 105 mg/dL   EKG 12 Lead   Result Value Ref Range    Ventricular Rate 80 BPM    Atrial Rate 80 BPM    P-R Interval 174 ms    QRS Duration 76 ms    Q-T Interval 378 ms    QTC Calculation (Bezet) 435 ms    Calculated P Axis 52 degrees    Calculated R Axis 37 degrees    Calculated T Axis 34 degrees    DIAGNOSIS, 93000       Normal sinus rhythm  Normal ECG  When compared with ECG of 14-Apr-2024 20:04,  No significant change was found  Confirmed by Karoline Charleston ([id number]) on 08/27/2024 4:36:01 PM  Medications   amLODIPine  (NORVASC ) tablet 5 mg ( Oral Automatically Held 08/30/24 0900)   aspirin  EC tablet 81 mg (has no administration in time range)   atorvastatin  (LIPITOR ) tablet 80 mg (has no administration in time range)   clopidogrel  (PLAVIX ) tablet 75 mg (has no  administration in time range)   ipratropium 0.5 mg-albuterol  2.5 mg (DUONEB ) nebulizer solution 1 Dose (has no administration in time range)   mirabegron  (MYRBETRIQ ) extended release tablet 25 mg (has no administration in time range)   pantoprazole  (PROTONIX ) tablet 40 mg (has no administration in time range)   tiotropium bromide  (SPIRIVA  RESPIMAT) 2.5 MCG/ACT inhaler 2 puff (has no administration in time range)   valsartan  (DIOVAN ) tablet 160 mg ( Oral Automatically Held 08/30/24 2100)   sodium chloride  flush 0.9 % injection 5-40 mL (has no administration in time range)   sodium chloride  flush 0.9 % injection 5-40 mL (has no administration in time range)   0.9 % sodium chloride  infusion (has no administration in time range)   enoxaparin  Sodium (LOVENOX ) injection 30 mg (has no administration in time range)   ondansetron  (ZOFRAN -ODT) disintegrating tablet 4 mg (has no administration in time range)     Or   ondansetron  (ZOFRAN ) injection 4 mg (has no administration in time range)   polyethylene glycol (GLYCOLAX ) packet 17 g (has no administration in time range)   acetaminophen  (TYLENOL ) tablet 650 mg (has no administration in time range)     Or   acetaminophen  (TYLENOL ) suppository 650 mg (has no administration in time range)   midodrine  (PROAMATINE ) tablet 10 mg (has no administration in time range)   cefepime  (MAXIPIME ) 1,000 mg in sodium chloride  0.9 % 50 mL IVPB (mini-bag) (has no administration in time range)   fluticasone  furoate-vilanterol (BREO ELLIPTA ) 100-25 MCG/ACT inhaler 1 puff (has no administration in time range)   albumin  human 25% IV solution 25 g (has no administration in time range)   carvedilol  (COREG ) tablet 6.25 mg (has no administration in time range)   *Draw vancomycin  trough on 11/27 w/ AM labs @ 0600 (has no administration in time range)   *Vancomycin  dosing per pharmacy ([facility]) (has no administration in time range)   sodium chloride  0.9 % bolus 1,000 mL (0 mLs IntraVENous Stopped 08/27/24  1602)   sodium chloride  0.9 % bolus 1,000 mL (1,000 mLs IntraVENous New Bag 08/27/24 1408)   ceFEPIme  (MAXIPIME ) 2,000 mg in sterile water  20 mL IV syringe (2,000 mg IntraVENous Given 08/27/24 1403)   vancomycin  (VANCOCIN ) 1500 mg in sodium chloride  0.9% 500 mL IVPB (1,500 mg IntraVENous New Bag 08/27/24 1504)   midodrine  (PROAMATINE ) tablet 10 mg (10 mg Oral Given 08/27/24 1606)       Critical Care    Performed by: Gasper Hough, MD  Authorized by: Gasper Hough, MD    Critical care provider statement:     Critical care time (minutes):  35    Critical care time was exclusive of:  Separately billable procedures and treating other patients and teaching time    Critical care was necessary to treat or prevent imminent or life-threatening deterioration of the following conditions:  Dehydration and metabolic crisis    Critical care was time spent personally by me on the following activities:  Development of treatment plan with patient or surrogate, blood draw for specimens, evaluation of patient's response to treatment, examination of patient, obtaining history from patient or surrogate, ordering and performing treatments and interventions, ordering and review of laboratory studies, ordering and  review of radiographic studies, pulse oximetry and re-evaluation of patient's condition    I assumed direction of critical care for this patient from another provider in my specialty: no      Care discussed with: admitting provider        My interpretation of imaging shows CXR with no consolidation or pneumothorax by my interpretation, patient declined foot XR    EKG shows sinus rhythm with a rate of 80 without any significant ST segment changes per my interpretation    Telemetry sinus rhythm on monitor by my interpretation    Medical Decision Making/ED Course        Medical Decision Making  Amount and/or Complexity of Data Reviewed  Labs: ordered.  Radiology: ordered.  ECG/medicine tests: ordered.    Risk  Prescription drug  management.  Decision regarding hospitalization.        RECORD REVIEW:  I reviewed the patient's previous records here at [facility] and available outside facilities and note that reviewed discharge summary from 05/02/2024, at that time patient admitted for foot pain, had foot abscess with osteomyelitis requiring I&D, placement of wound VAC, requiring cefepime , metronidazole  for several weeks afterwards    COMORBIDITIES impacting Evaluation and Management: see above    Severe exacerbation or progression of chronic illness:    Threat to body function without evaluation and management:      Social Determinants impacting Evaluation and Management:     Final Diagnosis       ICD-10-CM    1. Hypotension, unspecified hypotension type  I95.9       2. AKI (acute kidney injury)  N17.9       3. Open wound of foot excluding toes  S91.309A            Disposition   Admit    Odella Memo, MD  August 27, 2024  6:16 PM     *Portions of this electronic record were dictated using Dragon voice recognition software.  Unintended errors in translation may occur.    My signature above authenticates this document and my orders, the final    diagnosis (es), discharge prescription (s), and instructions in the Epic    record.  If you have any questions please contact [phone].     Nursing notes have been reviewed by the physician/ advanced practice    Clinician.               Memo Odella, MD  08/27/24 Susan [name]       Memo Odella, MD  08/27/24 1824    "

## 2024-08-27 NOTE — Progress Notes (Signed)
"  Patient should not be given any mood altering medications or sleeping pills considering patient presented with hypotension concern about sepsis needing IV fluid boluses albumin  and midodrine     No sleeping feel tonight unless blood pressure improves above systolic 120 and MAP above 65      "

## 2024-08-27 NOTE — ED Notes (Signed)
"  Patient refused xray.      Joesph Rosalynn LABOR, RN  08/27/24 1525    "

## 2024-08-28 LAB — CBC WITH AUTO DIFFERENTIAL
Basophils: 0.3 % (ref 0–3)
Eosinophils: 1.3 % (ref 0–5)
Hematocrit: 20.8 % — CL (ref 35.0–47.0)
Hemoglobin: 6.8 g/dL — CL (ref 11.0–16.0)
Immature Granulocytes %: 0.2 % (ref 0.0–3.0)
Lymphocytes: 17.3 % — ABNORMAL LOW (ref 28–48)
MCH: 29.6 pg (ref 25.4–34.6)
MCHC: 32.7 g/dL (ref 30.0–36.0)
MCV: 90.4 fL (ref 80.0–98.0)
MPV: 9.4 fL (ref 6.0–10.0)
Monocytes: 10.2 % (ref 1–13)
Neutrophils Segmented: 70.7 % — ABNORMAL HIGH (ref 34–64)
Nucleated RBCs: 0 (ref 0–0)
Platelets: 334 1000/mm3 (ref 140–450)
RBC: 2.3 M/uL — ABNORMAL LOW (ref 3.60–5.20)
RDW: 51.1 — ABNORMAL HIGH (ref 36.4–46.3)
WBC: 6.4 1000/mm3 (ref 4.0–11.0)

## 2024-08-28 LAB — IRON AND TIBC
% SATURATION: 8 % — ABNORMAL LOW (ref 20–45)
Iron: 10 ug/dL — ABNORMAL LOW (ref 50–170)
TIBC: 133 ug/dL — ABNORMAL LOW (ref 250–425)

## 2024-08-28 LAB — FOLATE: Folate: 4.3 ng/mL — ABNORMAL LOW (ref 5.38–24.00)

## 2024-08-28 LAB — COMPREHENSIVE METABOLIC PANEL W/ REFLEX TO MG FOR LOW K
ALT: <7 U/L — ABNORMAL LOW (ref 10–49)
AST: 10 U/L (ref 0.0–33.9)
Albumin: 2.8 g/dL — ABNORMAL LOW (ref 3.4–5.0)
Alkaline Phosphatase: 56 U/L (ref 46–116)
Anion Gap: 11 mmol/L (ref 5–15)
BUN: 23 mg/dL (ref 9–23)
CO2: 24 meq/L (ref 20–31)
Calcium: 8.8 mg/dL (ref 8.7–10.4)
Chloride: 102 meq/L (ref 98–107)
Creatinine: 1.69 mg/dL — ABNORMAL HIGH (ref 0.55–1.02)
Glucose: 103 mg/dL (ref 74–106)
Potassium: 3.9 meq/L (ref 3.5–5.1)
Sodium: 137 meq/L (ref 136–145)
Total Bilirubin: 0.3 mg/dL (ref 0.30–1.20)
Total Protein: 6.9 g/dL (ref 5.7–8.2)

## 2024-08-28 LAB — VITAMIN B12: Vitamin B-12: 501 pg/mL (ref 211–911)

## 2024-08-28 LAB — LACTATE, SEPSIS: Lactate: 1 mmol/L (ref 0.5–2.2)

## 2024-08-28 LAB — ABO/RH: ABO/Rh: A POS

## 2024-08-28 LAB — HEMOGLOBIN AND HEMATOCRIT
Hematocrit: 21.6 % — ABNORMAL LOW (ref 35.0–47.0)
Hemoglobin: 7 g/dL — ABNORMAL LOW (ref 11.0–16.0)

## 2024-08-28 LAB — FERRITIN: Ferritin: 265.7 ng/mL (ref 7.3–270.7)

## 2024-08-28 LAB — ANTIBODY SCREEN: Antibody Screen: NEGATIVE

## 2024-08-28 LAB — VANCOMYCIN LEVEL, TROUGH: Vancomycin Tr: 19.5 ug/mL — ABNORMAL HIGH (ref 5.0–10.0)

## 2024-08-28 MED ORDER — SODIUM CHLORIDE 0.9 % IV SOLN
0.9 | INTRAVENOUS | Status: DC | PRN
Start: 2024-08-28 — End: 2024-09-02

## 2024-08-28 MED ORDER — VANCOMYCIN INTERMITTENT DOSING (PLACEHOLDER)
Freq: Once | INTRAVENOUS | Status: AC
Start: 2024-08-28 — End: 2024-08-29
  Administered 2024-08-29: 14:00:00 1

## 2024-08-28 MED ORDER — HYDROCODONE-ACETAMINOPHEN 5-325 MG PO TABS
5-325 | Freq: Four times a day (QID) | ORAL | Status: DC | PRN
Start: 2024-08-28 — End: 2024-09-02
  Administered 2024-08-28 – 2024-09-02 (×13): 1 via ORAL

## 2024-08-28 MED ORDER — VANCOMYCIN (VANCOCIN) 1000 MG IN SODIUM CHLORIDE 0.9% 250 ML IVPB
Freq: Once | Status: AC
Start: 2024-08-28 — End: 2024-08-28
  Administered 2024-08-28: 14:00:00 1000 mg via INTRAVENOUS

## 2024-08-28 MED FILL — PANTOPRAZOLE SODIUM 40 MG PO TBEC: 40 mg | ORAL | Qty: 1 | Fill #0

## 2024-08-28 MED FILL — CLOPIDOGREL BISULFATE 75 MG PO TABS: 75 mg | ORAL | Qty: 1 | Fill #0

## 2024-08-28 MED FILL — SODIUM CHLORIDE 0.9 % IV SOLN: 0.9 % | INTRAVENOUS | Qty: 50 | Fill #0

## 2024-08-28 MED FILL — ACETAMINOPHEN 325 MG PO TABS: 325 mg | ORAL | Qty: 2 | Fill #0

## 2024-08-28 MED FILL — CEFEPIME HCL 1 G IJ SOLR: 1 g | INTRAMUSCULAR | Qty: 1000 | Fill #0

## 2024-08-28 MED FILL — ATORVASTATIN CALCIUM 40 MG PO TABS: 40 mg | ORAL | Qty: 2 | Fill #0

## 2024-08-28 MED FILL — ASPIRIN LOW DOSE 81 MG PO TBEC: 81 mg | ORAL | Qty: 1 | Fill #0

## 2024-08-28 MED FILL — LOVENOX 30 MG/0.3ML IJ SOSY: 30 MG/0.3ML | INTRAMUSCULAR | Qty: 0.3 | Fill #0

## 2024-08-28 MED FILL — ALBUKED 25 25 % IV SOLN: 25 % | INTRAVENOUS | Qty: 100 | Fill #0

## 2024-08-28 MED FILL — MIRABEGRON ER 25 MG PO TB24: 25 mg | ORAL | Qty: 1 | Fill #0

## 2024-08-28 MED FILL — MIDODRINE HCL 5 MG PO TABS: 5 mg | ORAL | Qty: 2 | Fill #0

## 2024-08-28 MED FILL — BD POSIFLUSH 0.9 % IV SOLN: 0.9 % | INTRAVENOUS | Qty: 40 | Fill #0

## 2024-08-28 MED FILL — VANCOMYCIN (VANCOCIN) 1000 MG IN SODIUM CHLORIDE 0.9% 250 ML IVPB: Qty: 250 | Fill #0

## 2024-08-28 MED FILL — HYDROCODONE-ACETAMINOPHEN 5-325 MG PO TABS: 5-325 mg | ORAL | Qty: 1 | Fill #0

## 2024-08-28 NOTE — Consults (Signed)
 "  [facility]    CONSULTATION    Assessment:   Susan [name] is a 73 y.o. female Currently admitted with findings of hypotension which improved with hydration.  Concerns for sepsis.  Bilateral lower extremity wounds are stable without evidence of acute infectious process.  There is no leukocytosis, she is afebrile at this time          Plan:   Patient was examined bedside. Chart reviewed  Bilateral wounds are stable without evidence of acute infectious process.  The wound beds are granular.  There is no deep tracking probing or tunneling to bone.  Low concern for acute infectious process.  Given her essentially bedbound status she does need diligent offloading of her heels this can either be done by floating her heels with pillows beneath the calf or using Prevalon boots.  This should be done at all times  Cultures: Pending, ID  Dressings: Continue with Exufiber/Aquacel Ag and a dry sterile dressing  WBAT   Recommend consultation to the wound care team for further wound care management.  No surgical plans at this time  Thank you for allowing me to participate in the care of this patient.    Subjective:   Susan [name] for bilateral foot wounds.  Follows with Dr. Rosamond dpm outpatient She  is a 73 y.o. female with PMH significant for as stated above, who was admitted on 08/27/2024 for Hypotension [I95.9]  AKI (acute kidney injury) [N17.9]  Hypotension, unspecified hypotension type [I95.9]  Open wound of foot excluding toes [S91.309A] .       Allergies   Allergen Reactions    Penicillins Other (See Comments)     States unknown reaction     Mother told her as a child she was allergic       No outpatient medications have been marked as taking for the 08/27/24 encounter [facility] Encounter).        Current Facility-Administered Medications:     [START ON 08/29/2024] vanc trough level on 11/28 at 0800, 1 each, Other, Once, Lonnie Passy, MD    0.9 % sodium chloride  infusion, ,  IntraVENous, PRN, Mehmood, Khalid, MD    HYDROcodone -acetaminophen  (NORCO ) 5-325 MG per tablet 1 tablet, 1 tablet, Oral, Q6H PRN, Mehmood, Khalid, MD, 1 tablet at 08/28/24 1735    [Held by provider] amLODIPine  (NORVASC ) tablet 5 mg, 5 mg, Oral, Daily, Lonnie Passy, MD    aspirin  EC tablet 81 mg, 81 mg, Oral, Daily, Lonnie Passy, MD, 81 mg at 08/28/24 9177    atorvastatin  (LIPITOR ) tablet 80 mg, 80 mg, Oral, QHS, Verma, Sourabh, MD, 80 mg at 08/27/24 2110    clopidogrel  (PLAVIX ) tablet 75 mg, 75 mg, Oral, Daily, Verma, Sourabh, MD, 75 mg at 08/28/24 9177    ipratropium 0.5 mg-albuterol  2.5 mg (DUONEB ) nebulizer solution 1 Dose, 1 Dose, Inhalation, Q4H PRN, Verma, Sourabh, MD    mirabegron  (MYRBETRIQ ) extended release tablet 25 mg, 25 mg, Oral, Daily, Verma, Sourabh, MD, 25 mg at 08/28/24 9177    pantoprazole  (PROTONIX ) tablet 40 mg, 40 mg, Oral, Daily, Verma, Sourabh, MD, 40 mg at 08/28/24 9177    [DISCONTINUED] budesonide -formoterol  (SYMBICORT ) 80-4.5 MCG/ACT inhaler 2 puff, 2 puff, Inhalation, BID RT **AND** tiotropium bromide  (SPIRIVA  RESPIMAT) 2.5 MCG/ACT inhaler 2 puff, 2 puff, Inhalation, Daily RT, Verma, Sourabh, MD, 2 puff at 08/28/24 9177    [Held by provider] valsartan  (DIOVAN ) tablet 160 mg, 160 mg, Oral, BID, Lonnie Passy, MD  sodium chloride  flush 0.9 % injection 5-40 mL, 5-40 mL, IntraVENous, 2 times per day, Lonnie Passy, MD, 10 mL at 08/28/24 0826    sodium chloride  flush 0.9 % injection 5-40 mL, 5-40 mL, IntraVENous, PRN, Lonnie Passy, MD    0.9 % sodium chloride  infusion, , IntraVENous, PRN, Verma, Sourabh, MD    enoxaparin  Sodium (LOVENOX ) injection 30 mg, 30 mg, SubCUTAneous, Daily, Lonnie Passy, MD, 30 mg at 08/28/24 9166    ondansetron  (ZOFRAN -ODT) disintegrating tablet 4 mg, 4 mg, Oral, Q8H PRN **OR** ondansetron  (ZOFRAN ) injection 4 mg, 4 mg, IntraVENous, Q6H PRN, Lonnie Passy, MD    polyethylene glycol (GLYCOLAX ) packet 17 g, 17 g, Oral, Daily PRN, Lonnie Passy, MD     acetaminophen  (TYLENOL ) tablet 650 mg, 650 mg, Oral, Q6H PRN, 650 mg at 08/28/24 0534 **OR** acetaminophen  (TYLENOL ) suppository 650 mg, 650 mg, Rectal, Q6H PRN, Lonnie Passy, MD    midodrine  (PROAMATINE ) tablet 10 mg, 10 mg, Oral, TID WC, Lonnie Passy, MD, 10 mg at 08/28/24 9177    cefepime  (MAXIPIME ) 1,000 mg in sodium chloride  0.9 % 50 mL IVPB (mini-bag), 1,000 mg, IntraVENous, Q24H, Lonnie Passy, MD, Stopped at 08/28/24 1741    fluticasone  furoate-vilanterol (BREO ELLIPTA ) 100-25 MCG/ACT inhaler 1 puff, 1 puff, Inhalation, Daily RT, Lonnie Passy, MD, 1 puff at 08/28/24 9177    [Held by provider] carvedilol  (COREG ) tablet 6.25 mg, 6.25 mg, Oral, BID WC, Lonnie Passy, MD    *Draw vancomycin  trough on 11/27 w/ AM labs @ 0600, 1 each, Other, Once, Lonnie Passy, MD    *Vancomycin  dosing per pharmacy ([facility]), 1 each, Other, RX Placeholder, Lonnie Passy, MD    Past Medical History:   Diagnosis Date    Hyperlipidemia     Hypertension      Past Surgical History:   Procedure Laterality Date    LEG SURGERY Right 04/17/2024    RIGHT HEEL IRRIGATION AND DEBRIDEMENT WITH WOUND VAC APPLICATION performed by Rosamond Inches, DPM at [facility] MAIN OR    LEG SURGERY Bilateral 04/25/2024    DEBRIDEMENT RIGHT FOOT WITH GRAFT APPLICATION; APPLICATION OF WOUND VAC;  DRAINAGE OF ABSCESS LEFT FOOT performed by Rosamond Inches, DPM at [facility] MAIN OR     No family history on file.  Social History     Socioeconomic History    Marital status: Legally Separated   Tobacco Use    Smoking status: Former     Types: Cigarettes    Smokeless tobacco: Never   Substance and Sexual Activity    Alcohol use: Not Currently    Drug use: Never    Sexual activity: Not Currently     Social Drivers of Health     Food Insecurity: No Food Insecurity (08/27/2024)    Hunger Vital Sign     Worried About Running Out of Food in the Last Year: Never true     Ran Out of Food in the Last Year: Never true   Transportation Needs: No Transportation Needs (08/27/2024)     PRAPARE - Therapist, Art (Medical): No     Lack of Transportation (Non-Medical): No   Housing Stability: Low Risk  (08/27/2024)    Housing Stability Vital Sign     Unable to Pay for Housing in the Last Year: No     Number of Times Moved in the Last Year: 0     Homeless in the Last Year: No           REVIEW  OF SYSTEMS:     Constitutional: negative for chills   Skin: negative for itching   HENT: negative for headaches   Eyes: negative for blurred vision   Cardiovascular: negative for chest pain   Respiratory: negative for shortness of breath   Gastointestinal: negative for abdominal pain   Genitourinary: not assessed   Musculoskeletal: negative for falls   Endo: negative for cold intolerance.   Heme: negative for anemia   Allergies: negative for hives   Neurological: negative for dizziness   Psychiatric:  not assessed           Vitals:    08/28/24 1800 08/28/24 1805 08/28/24 1815 08/28/24 1930   BP: 122/66  124/66 (!) 159/78   Pulse: 75  74 81   Resp: 14 14 13 12    Temp:   97.5 F (36.4 C) 97.4 F (36.3 C)   TempSrc:    Temporal   SpO2: 98%  97% 98%   Weight:       Height:             Focused Physical Exam:    Derm:               Bilateral lower extremity wounds with granular wound base and serosanguineous drainage.  No deep tracking probing or tunneling  No periwound erythema or proximal streaking.    Vascular Exam:  1+ pedal pulses    Neurological Exam:  Sensation is intact to light touch    Orthopedic:  Achilles tendon contractures bilaterally    Recent Labs     08/28/24  0545 08/28/24  0812   WBC 6.4  --    HCT 20.8* 21.6*   MCV 90.4  --      Recent Labs     08/28/24  0545   NA 137   CO2 24   ANIONGAP 11   CALCIUM  8.8   BUN 23   GLUCOSE 103     Recent Labs     08/28/24  0545   ALBUMIN  2.8*   ALKPHOS 56     No results for input(s): SOURCEUR in the last 72 hours.    Invalid input(s): UGLU, UBILIRUBIN, UKET, USPG, UOCB, UPH, UPSC, UUROBILISQ, UNITR, URINELEUKOC  No  results found for: GLU, GLUCPOC        Dorise Later, DPM   [facility]     "

## 2024-08-28 NOTE — Progress Notes (Signed)
"  [facility] Dosing Services: Vancomycin  Progress Note    This consult is provided for this 73 y.o. year old female for vancomycin  dosing.    Vancomycin  indication: Skin and soft tissue infection    Day of therapy: 2    Ht Readings from Last 1 Encounters:   08/27/24 1.702 m (5' 7)        Wt Readings from Last 1 Encounters:   08/27/24 63 kg (139 lb)        Previous Regimen Dose by level   Last Level 19.5   Other Current Antibiotics Cefepime    Serum Creatinine Lab Results   Component Value Date/Time    CREATININE 1.69 08/28/2024 05:45 AM      Creatinine Clearance Estimated Creatinine Clearance: 29 mL/min (A) (based on SCr of 1.69 mg/dL (H)).   BUN Lab Results   Component Value Date/Time    BUN 23 08/28/2024 05:45 AM      WBC Lab Results   Component Value Date/Time    WBC 6.4 08/28/2024 05:45 AM      H/H Lab Results   Component Value Date/Time    HGB 6.8 08/28/2024 05:45 AM      Platelets Lab Results   Component Value Date/Time    PLT 334 08/28/2024 05:45 AM      Temp 99 F (37.2 C) (Temporal)     Trough goal: 10-20 mcg/mL  AUC/MIC goal: 400-600 mcg*hr/mL    Plan and Dose Adjustments: Vancomycin  level is extrapolated to 19.5 mcg/mL around 13 hours after and therapeutic. Will order once dose of 1,000 mg (~15 mg/kg) at 0900 on 11/27. Will obtain repeat level on 11/28 at 0800.    Continue to monitor. Pharmacy to follow daily and will make changes to dose and/or frequency based on clinical status.    Thank you for this consult.    Cecilia Margarito Cramp  Pharmacist       "

## 2024-08-28 NOTE — "Consent " (Signed)
"  Informed Consent for Blood Component Transfusion Note    I have discussed with the patient and daughter the rationale for blood component transfusion; its benefits in treating or preventing fatigue, organ damage, or death; and its risk which includes mild transfusion reactions, rare risk of blood borne infection, or more serious but rare reactions. I have discussed the alternatives to transfusion, including the risk and consequences of not receiving transfusion. The patient and daughter had an opportunity to ask questions and had agreed to proceed with transfusion of blood components.    Electronically signed by BELYNDA HOOF, MD on 08/28/24 at 1:35 PM EST  "

## 2024-08-28 NOTE — Progress Notes (Signed)
 "Medicine Progress Note    Date of Admission: 08/27/2024  Length of Stay: 1    Assessment And Plan     Suspect sepsis  Cellulitis right foot  Acute worsening of chronic anemia  AKI  COPD  Chronic hypoxic respiratory failure  Bipolar disorder  PAD    Treatment plan  Patient admitted with suspect sepsis.  Patient was hypotensive admission.  Improved with IV hydration.  Continue IV antibiotic.  Follow-up on culture.  Wound care consulted.  Infectious disease consulted.  CBC WBC 6.4 hemoglobin 6.8 platelets 3 39,000.  Type and cross transfuse 1 unit packed RBC.  Check iron studies, B12 and folate.  Check stool for occult blood.  Continue Protonix .  BMP sodium 137 potassium 3.5 BUN 23 creatinine 1.69.  Daughter updated at bedside.  Continue IV fluids.  Continue oxygen.      Discussed with patient. Updates regarding diagnose, tests results, prognosis, treatment  of the patient's medical conditions discussed in detail     Subjective:     Complaining of pain in both feet.  Hypertension is better now.  Still feeling very weak.     Objective:     Patient Vitals for the past 12 hrs:   Temp Pulse Resp BP SpO2   08/28/24 1200 -- 80 17 (!) 159/74 94 %   08/28/24 1100 -- 76 11 127/65 100 %   08/28/24 1043 97.1 F (36.2 C) 72 15 132/71 99 %   08/28/24 1000 -- 74 14 (!) 99/54 99 %   08/28/24 0900 -- 72 15 132/71 99 %   08/28/24 0800 -- 72 13 (!) 105/58 100 %   08/28/24 0734 97.1 F (36.2 C) -- -- -- --   08/28/24 0700 -- 86 12 132/68 98 %   08/28/24 0600 -- 88 20 (!) 101/46 99 %   08/28/24 0430 99 F (37.2 C) -- -- -- --   08/28/24 0400 -- 83 15 (!) 109/56 98 %   08/28/24 0200 -- 76 16 (!) 95/44 97 %         Intake/Output Summary (Last 24 hours) at 08/28/2024 1335  Last data filed at 08/27/2024 1936  Gross per 24 hour   Intake 1100 ml   Output --   Net 1100 ml     Constitutional: Awake and alert, NAD  HENT: atraumatic, normocephalic, oropharynx clear    Eyes:   conjunctiva normal  Neck:   supple and trachea normal  Cardiovascular:   Regular rate and rhythm, heart sounds normal, intact distal pulses  Pulmonary/Chest Wall: breath sounds normal and effort normal  Abdominal:      appearance normal, soft, non-tender upon palpation,bowel sounds normal.  Neurological:   awake, alert and oriented, CN intact, generalized weakness..  Extremities:   Right foot dressing.          Recent Results (from the past 48 hours)   POCT Glucose    Collection Time: 08/27/24 12:05 PM   Result Value Ref Range    POC Glucose 129 (H) 65 - 105 mg/dL   EKG 12 Lead    Collection Time: 08/27/24 12:37 PM   Result Value Ref Range    Ventricular Rate 80 BPM    Atrial Rate 80 BPM    P-R Interval 174 ms    QRS Duration 76 ms    Q-T Interval 378 ms    QTC Calculation (Bezet) 435 ms    Calculated P Axis 52 degrees    Calculated R Axis  37 degrees    Calculated T Axis 34 degrees    DIAGNOSIS, 93000       Normal sinus rhythm  Normal ECG  When compared with ECG of 14-Apr-2024 20:04,  No significant change was found  Confirmed by Karoline Charleston ([id number]) on 08/27/2024 4:36:01 PM     Comprehensive Metabolic Panel    Collection Time: 08/27/24 12:42 PM   Result Value Ref Range    Potassium 4.5 3.5 - 5.1 mEq/L    Chloride 95 (L) 98 - 107 mEq/L    Sodium 132 (L) 136 - 145 mEq/L    CO2 28 20 - 31 mEq/L    Glucose 119 (H) 74 - 106 mg/dl    BUN 27 (H) 9 - 23 mg/dl    Creatinine 7.50 (H) 0.55 - 1.02 mg/dl    GFR African American 24.0      GFR Non-African American 20      Calcium  9.5 8.7 - 10.4 mg/dl    Anion Gap 9 5 - 15 mmol/L    AST 16.0 0.0 - 33.9 U/L    ALT <7 (L) 10 - 49 U/L    Alkaline Phosphatase 81 46 - 116 U/L    Total Bilirubin 0.40 0.30 - 1.20 mg/dl    Total Protein 8.5 (H) 5.7 - 8.2 gm/dl    Albumin  2.6 (L) 3.4 - 5.0 gm/dl   CBC with Auto Differential    Collection Time: 08/27/24 12:42 PM   Result Value Ref Range    WBC 7.1 4.0 - 11.0 1000/mm3    RBC 3.00 (L) 3.60 - 5.20 M/uL    Hemoglobin 8.8 (L) 11.0 - 16.0 gm/dl    Hematocrit 72.5 (L) 35.0 - 47.0 %    MCV 91.3 80.0 - 98.0 fL    MCH  29.3 25.4 - 34.6 pg    MCHC 32.1 30.0 - 36.0 gm/dl    Platelets 553 859 - 450 1000/mm3    MPV 9.4 6.0 - 10.0 fL    RDW 51.5 (H) 36.4 - 46.3      Nucleated RBCs 0 0 - 0      Immature Granulocytes % 0.3 0.0 - 3.0 %    Neutrophils Segmented 68.5 (H) 34 - 64 %    Lymphocytes 21.3 (L) 28 - 48 %    Monocytes 8.9 1 - 13 %    Eosinophils 0.7 0 - 5 %    Basophils 0.3 0 - 3 %   Troponin    Collection Time: 08/27/24 12:42 PM   Result Value Ref Range    Troponin, High Sensitivity 5 0 - 34 ng/L   Culture, Blood 2    Collection Time: 08/27/24  1:30 PM    Specimen: Antecubital, Left   Result Value Ref Range    BLOOD CULTURE RESULT Culture In Progress, Daily Updates To Follow     Culture, Blood 1    Collection Time: 08/27/24  1:40 PM    Specimen: Blood   Result Value Ref Range    BLOOD CULTURE RESULT Culture In Progress, Daily Updates To Follow     Procalcitonin    Collection Time: 08/27/24  1:40 PM   Result Value Ref Range    Procalcitonin 0.20 0.00 - 0.50 ng/ml   Lactate, Sepsis    Collection Time: 08/27/24  1:40 PM   Result Value Ref Range    Lactate 1.5 0.5 - 2.2 mmol/L   Troponin    Collection Time: 08/27/24  2:07 PM  Result Value Ref Range    Troponin, High Sensitivity 4 0 - 34 ng/L   Lactate, Sepsis    Collection Time: 08/27/24  9:12 PM   Result Value Ref Range    Lactate 1.0 0.5 - 2.2 mmol/L   CBC with Auto Differential    Collection Time: 08/28/24  5:45 AM   Result Value Ref Range    WBC 6.4 4.0 - 11.0 1000/mm3    RBC 2.30 (L) 3.60 - 5.20 M/uL    Hemoglobin 6.8 (LL) 11.0 - 16.0 gm/dl    Hematocrit 79.1 (LL) 35.0 - 47.0 %    MCV 90.4 80.0 - 98.0 fL    MCH 29.6 25.4 - 34.6 pg    MCHC 32.7 30.0 - 36.0 gm/dl    Platelets 665 859 - 450 1000/mm3    MPV 9.4 6.0 - 10.0 fL    RDW 51.1 (H) 36.4 - 46.3      Nucleated RBCs 0 0 - 0      Immature Granulocytes % 0.2 0.0 - 3.0 %    Neutrophils Segmented 70.7 (H) 34 - 64 %    Lymphocytes 17.3 (L) 28 - 48 %    Monocytes 10.2 1 - 13 %    Eosinophils 1.3 0 - 5 %    Basophils 0.3 0 - 3 %    Comprehensive Metabolic Panel w/ Reflex to MG    Collection Time: 08/28/24  5:45 AM   Result Value Ref Range    Potassium 3.9 3.5 - 5.1 mEq/L    Chloride 102 98 - 107 mEq/L    Sodium 137 136 - 145 mEq/L    CO2 24 20 - 31 mEq/L    Glucose 103 74 - 106 mg/dl    BUN 23 9 - 23 mg/dl    Creatinine 8.30 (H) 0.55 - 1.02 mg/dl    Calcium  8.8 8.7 - 10.4 mg/dl    Anion Gap 11 5 - 15 mmol/L    AST 10.0 0.0 - 33.9 U/L    ALT <7 (L) 10 - 49 U/L    Alkaline Phosphatase 56 46 - 116 U/L    Total Bilirubin 0.30 0.30 - 1.20 mg/dl    Total Protein 6.9 5.7 - 8.2 gm/dl    Albumin  2.8 (L) 3.4 - 5.0 gm/dl   Vancomycin  Level, Trough    Collection Time: 08/28/24  5:45 AM   Result Value Ref Range    Vancomycin  Tr 19.5 (H) 5.0 - 10.0 mcg/ml   Hemoglobin and Hematocrit    Collection Time: 08/28/24  8:12 AM   Result Value Ref Range    Hemoglobin 7.0 (L) 11.0 - 16.0 gm/dl    Hematocrit 78.3 (L) 35.0 - 47.0 %   Iron and TIBC    Collection Time: 08/28/24  8:12 AM   Result Value Ref Range    Iron 10 (L) 50 - 170 mcg/dl    TIBC 866 (L) 749 - 574 mcg/dl    % SATURATION 8 (L) 20 - 45 %   Folate    Collection Time: 08/28/24  8:12 AM   Result Value Ref Range    Folate 4.30 (L) 5.38 - 24.00 ng/ml   Ferritin    Collection Time: 08/28/24  8:12 AM   Result Value Ref Range    Ferritin 265.7 7.3 - 270.7 ng/ml   Vitamin B12    Collection Time: 08/28/24  8:12 AM   Result Value Ref Range    Vitamin B-12 501 211 -  911 pg/ml         Labs in Last 3 months: No results found for: TSH, VITD25, PSA, INR, GLUF, NTPROBNP, LABA1C, MALBCR       Lipids: No results for input(s): CHOL, HDL in the last 72 hours.    Invalid input(s): LDLCALCU  INR: No results for input(s): INR in the last 72 hours.        Microbiology-  Urine Cx: No results found for: LABURIN  Blood Cx: No results found for: BC  Sputum Cx: No results found for: RESPCULTURE  Gram Stain:   Lab Results   Component Value Date    LABGRAM  05/08/2024     Few Epithelial Cells  Rare  WBC'S  No Organisms Seen       PNA PCR: No results found for: PNPCRPNL  COVID19: No results found for: COVID19  Legionella Ag: No results found for: LEGIONELLAANTIGEN  Strep Ag: No results for input(s): STREPNEUMAGU in the last 72 hours.    Radiology reports as per the Radiologist  Radiology: XR CHEST (2 VW)  Result Date: 08/27/2024  CLINICAL INDICATION: Chest pain Pain. TECHNIQUE:  PA and lateral chest radiographs. FINDINGS:  The heart size is within normal limits. The mediastinal contours are unremarkable. The lungs are clear and the costophrenic angles are sharp. The osseous structures of the thorax are intact. There is a disc space narrowing and bridging osteophytes at several levels in the thoracic spine. There is no significant change compared with the 04/15/2024 chest x-ray.     IMPRESSION:  No radiographic evidence of acute cardiopulmonary disease. Electronically signed by: Lynwood Harden, MD 08/27/2024 12:56 PM EST          Workstation ID: [id number]                  No results found for this or any previous visit.      No results found for this or any previous visit.          Current Facility-Administered Medications   Medication Dose Route Frequency    [START ON 08/29/2024] vanc trough level on 11/28 at 0800  1 each Other Once    0.9 % sodium chloride  infusion   IntraVENous PRN    [Held by provider] amLODIPine  (NORVASC ) tablet 5 mg  5 mg Oral Daily    aspirin  EC tablet 81 mg  81 mg Oral Daily    atorvastatin  (LIPITOR ) tablet 80 mg  80 mg Oral QHS    clopidogrel  (PLAVIX ) tablet 75 mg  75 mg Oral Daily    ipratropium 0.5 mg-albuterol  2.5 mg (DUONEB ) nebulizer solution 1 Dose  1 Dose Inhalation Q4H PRN    mirabegron  (MYRBETRIQ ) extended release tablet 25 mg  25 mg Oral Daily    pantoprazole  (PROTONIX ) tablet 40 mg  40 mg Oral Daily    tiotropium bromide  (SPIRIVA  RESPIMAT) 2.5 MCG/ACT inhaler 2 puff  2 puff Inhalation Daily RT    [Held by provider] valsartan  (DIOVAN ) tablet 160 mg  160 mg Oral BID     sodium chloride  flush 0.9 % injection 5-40 mL  5-40 mL IntraVENous 2 times per day    sodium chloride  flush 0.9 % injection 5-40 mL  5-40 mL IntraVENous PRN    0.9 % sodium chloride  infusion   IntraVENous PRN    enoxaparin  Sodium (LOVENOX ) injection 30 mg  30 mg SubCUTAneous Daily    ondansetron  (ZOFRAN -ODT) disintegrating tablet 4 mg  4 mg Oral Q8H PRN    Or  ondansetron  (ZOFRAN ) injection 4 mg  4 mg IntraVENous Q6H PRN    polyethylene glycol (GLYCOLAX ) packet 17 g  17 g Oral Daily PRN    acetaminophen  (TYLENOL ) tablet 650 mg  650 mg Oral Q6H PRN    Or    acetaminophen  (TYLENOL ) suppository 650 mg  650 mg Rectal Q6H PRN    midodrine  (PROAMATINE ) tablet 10 mg  10 mg Oral TID WC    cefepime  (MAXIPIME ) 1,000 mg in sodium chloride  0.9 % 50 mL IVPB (mini-bag)  1,000 mg IntraVENous Q24H    fluticasone  furoate-vilanterol (BREO ELLIPTA ) 100-25 MCG/ACT inhaler 1 puff  1 puff Inhalation Daily RT    [Held by provider] carvedilol  (COREG ) tablet 6.25 mg  6.25 mg Oral BID WC    *Draw vancomycin  trough on 11/27 w/ AM labs @ 0600  1 each Other Once    *Vancomycin  dosing per pharmacy ([facility])  1 each Other RX Placeholder            Dragon medical dictation software was used for portions of this report. Unintended errors may occur.   BELYNDA HOOF, MD  08/28/2024             "

## 2024-08-28 NOTE — Consults (Signed)
 "           INFECTIOUS DISEASE CONSULT NOTE         Requested by: Dr. Curt    Reason for consult:Foot ulcer    Date of admission: 08/27/2024    Date of consult: August 28, 2024      ABX:     Current abx Prior abx    Vancomycin  11/26-1  Cefepime  11/26-0      ASSESSMENT:       Hypotension   Chronic LE wounds   Hx of R foot abscess/ostemyelitis with negative cultures July 2025  -Previously tx with Cefepime  x 6 weeks and Flagyl  x 4 weeks   AKI                       RECOMMENDATIONS:     73 year old female with history of PAD, right foot osteomyelitis presented to the hospital 08/27/2024 due to hypotension.  Her blood pressures were being addressed as an outpatient.  Endorses decreased p.o. intake.  ID consulted due to chronic foot wound.  Overall the foot wound appears to be healing, but does have a small amount of drainage and warmth on palpation so we will obtain a culture.  Less likely that infection is causing her hypotension given that she is afebrile with normal WBC, normal procalcitonin.    - Follow-up wound culture.  Recommend podiatry and wound care consult.  Check ESR/CRP.  Do not anticipate that she will require a long course of antibiotics.  Can continue Vanco and cefepime  renally adjusted for now. Discussed with daughter at bedside.                    Ann Olden, DO  Infectious Disease  [facility]      MICROBIOLOGY:     Microbiology Results  08/27/24 BCX NGTD    LINES AND CATHETERS:       HPI:   73 year old female with history of hypertension, COPD on 2 L of home oxygen, right foot abscess/osteomyelitis presented to the hospital 08/27/2024 due to hypotension.  States her blood pressure has been getting lower for the past few weeks.  Initially was on 3 blood pressure medications and was taken off of 1 a week prior to arrival.  Denies any other symptoms.  Has chronic leg pain.  Per daughter at bedside the wounds in her bilateral feet are being addressed by wound care and appear  to be healing well.    Was hospitalized in July/August 2025 due to a right foot abscess.  Had I&D done and cultures were unrevealing.  Pathology was consistent with acute on chronic osteomyelitis.  She was ultimately treated empirically with 6 weeks of cefepime  and 4 weeks of metronidazole .  Has been following up with wound care since and been doing well.    Currently on vancomycin  and cefepime  which was started on admission.  No fevers or leukocytosis.    Past medical history:     Past Medical History:   Diagnosis Date    Hyperlipidemia     Hypertension        Past Surgical History:   Procedure Laterality Date    LEG SURGERY Right 04/17/2024    RIGHT HEEL IRRIGATION AND DEBRIDEMENT WITH WOUND VAC APPLICATION performed by Rosamond Inches, DPM at [facility] MAIN OR    LEG SURGERY Bilateral 04/25/2024    DEBRIDEMENT RIGHT FOOT WITH GRAFT APPLICATION; APPLICATION OF WOUND VAC;  DRAINAGE OF ABSCESS LEFT  FOOT performed by Rosamond Inches, DPM at [facility] MAIN OR        Social History:     Social History     Socioeconomic History    Marital status: Legally Separated     Spouse name: Not on file    Number of children: Not on file    Years of education: Not on file    Highest education level: Not on file   Occupational History    Not on file   Tobacco Use    Smoking status: Former     Types: Cigarettes    Smokeless tobacco: Never   Substance and Sexual Activity    Alcohol use: Not Currently    Drug use: Never    Sexual activity: Not Currently   Other Topics Concern    Not on file   Social History Narrative    Not on file     Social Drivers of Health     Financial Resource Strain: Not on file   Food Insecurity: No Food Insecurity (08/27/2024)    Hunger Vital Sign     Worried About Running Out of Food in the Last Year: Never true     Ran Out of Food in the Last Year: Never true   Transportation Needs: No Transportation Needs (08/27/2024)    PRAPARE - Therapist, Art (Medical): No     Lack of Transportation  (Non-Medical): No   Physical Activity: Not on file   Stress: Not on file   Social Connections: Not on file   Intimate Partner Violence: Not on file   Housing Stability: Low Risk  (08/27/2024)    Housing Stability Vital Sign     Unable to Pay for Housing in the Last Year: No     Number of Times Moved in the Last Year: 0     Homeless in the Last Year: No       Family History:   No family history on file.  Denies family history of recurrent infections  Allergies:     Allergies   Allergen Reactions    Penicillins Other (See Comments)     States unknown reaction     Mother told her as a child she was allergic     Tolerates cephalosporins    Home Medications:   Medications Prior to Admission: TRELEGY ELLIPTA  200-62.5-25 MCG/ACT AEPB inhaler, Inhale 1 puff into the lungs daily  atorvastatin  (LIPITOR ) 80 MG tablet, Take 1 tablet by mouth nightly  valsartan  (DIOVAN ) 160 MG tablet, Take 1 tablet by mouth 2 times daily  QUEtiapine  (SEROQUEL ) 100 MG tablet, Take 1 tablet by mouth nightly  carvedilol  (COREG ) 12.5 MG tablet, Take 1 tablet by mouth 2 times daily (with meals)  amLODIPine  (NORVASC ) 5 MG tablet, Take 1 tablet by mouth daily  furosemide  (LASIX ) 20 MG tablet, Take 1 tablet by mouth daily  potassium chloride  (KLOR-CON ) 10 MEQ extended release tablet, Take 1 tablet by mouth daily with food  clopidogrel  (PLAVIX ) 75 MG tablet, Take 1 tablet by mouth daily  pantoprazole  (PROTONIX ) 40 MG tablet, Take 1 tablet by mouth daily  MYRBETRIQ  25 MG TB24, Take 1 tablet by mouth daily  docusate sodium  (COLACE, DULCOLAX) 100 MG CAPS, Take 100 mg by mouth 2 times daily as needed for Constipation  sodium hypochlorite (DAKINS) 0.125 % SOLN external solution, Apply topically daily  ipratropium 0.5 mg-albuterol  2.5 mg (DUONEB ) 0.5-2.5 (3) MG/3ML SOLN nebulizer solution, Inhale 3 mLs into the lungs  every 4 hours as needed for Shortness of Breath  aspirin  81 MG EC tablet, Take 1 tablet by mouth daily     Current Medications:     Current  Facility-Administered Medications   Medication Dose Route Frequency Provider Last Rate Last Admin    vancomycin  (VANCOCIN ) 1000 mg in sodium chloride  0.9% 250 mL IVPB  1,000 mg IntraVENous Once Lonnie Passy, MD        NOREEN ON 08/29/2024] vanc trough level on 11/28 at 0800  1 each Other Once Lonnie Passy, MD        [facility] by provider] amLODIPine  (NORVASC ) tablet 5 mg  5 mg Oral Daily Verma, Sourabh, MD        aspirin  EC tablet 81 mg  81 mg Oral Daily Verma, Sourabh, MD        atorvastatin  (LIPITOR ) tablet 80 mg  80 mg Oral QHS Verma, Sourabh, MD   80 mg at 08/27/24 2110    clopidogrel  (PLAVIX ) tablet 75 mg  75 mg Oral Daily Verma, Sourabh, MD   75 mg at 08/27/24 1820    ipratropium 0.5 mg-albuterol  2.5 mg (DUONEB ) nebulizer solution 1 Dose  1 Dose Inhalation Q4H PRN Verma, Sourabh, MD        mirabegron  (MYRBETRIQ ) extended release tablet 25 mg  25 mg Oral Daily Verma, Sourabh, MD        pantoprazole  (PROTONIX ) tablet 40 mg  40 mg Oral Daily Verma, Sourabh, MD        tiotropium bromide  (SPIRIVA  RESPIMAT) 2.5 MCG/ACT inhaler 2 puff  2 puff Inhalation Daily RT Lonnie Passy, MD   2 puff at 08/27/24 1824    [Held by provider] valsartan  (DIOVAN ) tablet 160 mg  160 mg Oral BID Lonnie Passy, MD        sodium chloride  flush 0.9 % injection 5-40 mL  5-40 mL IntraVENous 2 times per day Lonnie Passy, MD   10 mL at 08/28/24 0155    sodium chloride  flush 0.9 % injection 5-40 mL  5-40 mL IntraVENous PRN Lonnie Passy, MD        0.9 % sodium chloride  infusion   IntraVENous PRN Verma, Sourabh, MD        enoxaparin  Sodium (LOVENOX ) injection 30 mg  30 mg SubCUTAneous Daily Verma, Sourabh, MD        ondansetron  (ZOFRAN -ODT) disintegrating tablet 4 mg  4 mg Oral Q8H PRN Lonnie Passy, MD        Or    ondansetron  (ZOFRAN ) injection 4 mg  4 mg IntraVENous Q6H PRN Lonnie Passy, MD        polyethylene glycol (GLYCOLAX ) packet 17 g  17 g Oral Daily PRN Lonnie Passy, MD        acetaminophen  (TYLENOL ) tablet 650 mg  650 mg  Oral Q6H PRN Lonnie Passy, MD   650 mg at 08/28/24 0534    Or    acetaminophen  (TYLENOL ) suppository 650 mg  650 mg Rectal Q6H PRN Lonnie Passy, MD        midodrine  (PROAMATINE ) tablet 10 mg  10 mg Oral TID WC Verma, Sourabh, MD        cefepime  (MAXIPIME ) 1,000 mg in sodium chloride  0.9 % 50 mL IVPB (mini-bag)  1,000 mg IntraVENous Q24H Lonnie Passy, MD        fluticasone  furoate-vilanterol (BREO ELLIPTA ) 100-25 MCG/ACT inhaler 1 puff  1 puff Inhalation Daily RT Lonnie Passy, MD   1 puff at 08/27/24 1823    [Held by provider] carvedilol  (  COREG ) tablet 6.25 mg  6.25 mg Oral BID WC Lonnie Passy, MD        *Draw vancomycin  trough on 11/27 w/ AM labs @ 0600  1 each Other Once Lonnie Passy, MD        *Vancomycin  dosing per pharmacy (DPL)  1 each Other RX Placeholder Lonnie Passy, MD           Review of Systems:   12 points ROS done and negative except as per HPI      Physical Exam:    Vitals  Temp (24hrs), Avg:98.3 F (36.8 C), Min:97.7 F (36.5 C), Max:99 F (37.2 C)    BP 132/68   Pulse 86   Temp 99 F (37.2 C) (Temporal)   Resp 12   Ht 1.702 m (5' 7)   Wt 67.5 kg (148 lb 14.4 oz)   SpO2 98%   BMI 23.32 kg/m     General: Well-developed, 73 y.o. year-old, female, appears uncomfortable from LE pain  HEENT: Normocephalic, anicteric sclerae, Pupils equal, round reactive to light, no oropharyngeal lesions.    Neck: Supple,    Chest: Symmetrical expansion  Lungs: Clear to auscultation bilaterally, no dullness  Heart: Regular rhythm,   Abdomen: Soft, non-tender,non distended, no organomegaly, BS+  Musculoskeletal: No edema. No clubbing or cyanosis  SKIN: Chronic lower extremity foot wounds.  See photos.  Small amount of drainage noted on dressing with some surrounding warmth and tenderness to palpation around wound on R heel.         Labs: Results:   Chemistry Recent Labs     08/27/24  1242 08/28/24  0545   NA 132* 137   K 4.5 3.9   CL 95* 102   CO2 28 24   BUN 27* 23      CBC w/Diff Recent Labs      08/27/24  1242 08/28/24  0545   WBC 7.1 6.4   RBC 3.00* 2.30*   HGB 8.8* 6.8*   HCT 27.4* 20.8*   PLT 446 334      Microbiology Invalid input(s): CULT           RADIOLOGY     CXR 08/27/24  IMPRESSION:      No radiographic evidence of acute cardiopulmonary disease.     ---------------------------------------------------------------------------------------------------------------  I have independently examined the patient and reviewed all lab studies and imaging as well as review of nursing notes and physican notes from the past 24 hours. The plan of care has been discussed with the patient/relative and all questions are answered.     Dragon medical dictation software was used for portions of this report. Unintended errors may occur.     ANN OLDEN, DO  08/28/2024    [facility]   Office Phone:[phone]  Fax:[phone]     "

## 2024-08-28 NOTE — Plan of Care (Signed)
"    Problem: Safety - Adult  Goal: Free from fall injury  Outcome: Progressing     "

## 2024-08-28 NOTE — Progress Notes (Signed)
"  Chaplain Services  Unable to Visit Patient    Start Time: [phone]  End Time: 0959    Chaplain attempted to conduct a Consultation and Spiritual Assessment for Susan [name], who is a 73 y.o.,female.  According to the patient's EMR Religious Affiliation is: None.     Patient is asleep and is not available to be assessed at this time.  Offered prayer remotely on patient's behalf.     Assessment:  Patient has no known religious/cultural needs that will affect patient's preferences in health care.  Patient has no known spiritual or religious issues which require intervention at this time.     Plan:  Chaplains will continue to follow and will provide pastoral care on an as needed/requested basis.  Chaplain recommends bedside caregivers page Duty Chaplain if patient shows signs of acute spiritual or emotional distress.     Dakhari Zuver [facility]   Chaplain  Spiritual Care  [phone]  "

## 2024-08-28 NOTE — Progress Notes (Signed)
 Risk and benefits of blood transfer and discuss with patient and daughter at bedside. They are agreeable with blood transfusion.

## 2024-08-28 NOTE — Plan of Care (Signed)
"    Problem: Safety - Adult  Goal: Free from fall injury  08/28/2024 1041 by Loletta [employer], RN  Outcome: Progressing  08/28/2024 0645 by Anice Fredrickson, RN  Outcome: Progressing     Problem: Pain  Goal: Verbalizes/displays adequate comfort level or baseline comfort level  Outcome: Progressing     "

## 2024-08-29 LAB — PREPARE RBC (CROSSMATCH)
Blood Type: 6200
Dispense Status Blood Bank: TRANSFUSED
Specimen Expiration: 202511302359
Unit Issue Date/Time: 202511271537

## 2024-08-29 LAB — HEMOGLOBIN AND HEMATOCRIT
Hematocrit: 26 % — ABNORMAL LOW (ref 35.0–47.0)
Hemoglobin: 8.7 g/dL — ABNORMAL LOW (ref 11.0–16.0)

## 2024-08-29 LAB — CBC WITH AUTO DIFFERENTIAL
Basophils: 0.3 % (ref 0–3)
Eosinophils: 1.6 % (ref 0–5)
Hematocrit: 27.8 % — ABNORMAL LOW (ref 35.0–47.0)
Hemoglobin: 9.1 g/dL — ABNORMAL LOW (ref 11.0–16.0)
Immature Granulocytes %: 0.3 % (ref 0.0–3.0)
Lymphocytes: 22 % — ABNORMAL LOW (ref 28–48)
MCH: 29.5 pg (ref 25.4–34.6)
MCHC: 32.7 g/dL (ref 30.0–36.0)
MCV: 90.3 fL (ref 80.0–98.0)
MPV: 9.7 fL (ref 6.0–10.0)
Monocytes: 9.6 % (ref 1–13)
Neutrophils Segmented: 66.2 % — ABNORMAL HIGH (ref 34–64)
Nucleated RBCs: 0 (ref 0–0)
Platelets: 426 1000/mm3 (ref 140–450)
RBC: 3.08 M/uL — ABNORMAL LOW (ref 3.60–5.20)
RDW: 51.2 — ABNORMAL HIGH (ref 36.4–46.3)
WBC: 6.3 1000/mm3 (ref 4.0–11.0)

## 2024-08-29 LAB — BASIC METABOLIC PANEL
Anion Gap: 9 mmol/L (ref 5–15)
BUN: 14 mg/dL (ref 9–23)
CO2: 23 meq/L (ref 20–31)
Calcium: 9.2 mg/dL (ref 8.7–10.4)
Chloride: 101 meq/L (ref 98–107)
Creatinine: 1.2 mg/dL — ABNORMAL HIGH (ref 0.55–1.02)
GFR African American: 57
GFR Non-African American: 47
Glucose: 133 mg/dL — ABNORMAL HIGH (ref 74–106)
Potassium: 3.9 meq/L (ref 3.5–5.1)
Sodium: 133 meq/L — ABNORMAL LOW (ref 136–145)

## 2024-08-29 LAB — C-REACTIVE PROTEIN: CRP: 109.7 mg/L — ABNORMAL HIGH (ref 0.0–5.0)

## 2024-08-29 LAB — MRSA BY PCR: MRSA PCR screen: POSITIVE — AB

## 2024-08-29 LAB — VANCOMYCIN LEVEL, TROUGH: Vancomycin Tr: 19.4 ug/mL — ABNORMAL HIGH (ref 5.0–10.0)

## 2024-08-29 LAB — SEDIMENTATION RATE: Sed Rate, Automated: 82 mm/h — ABNORMAL HIGH (ref 0–30)

## 2024-08-29 MED ORDER — PLACEHOLDER
Freq: Once | Status: DC
Start: 2024-08-29 — End: 2024-08-30

## 2024-08-29 MED ORDER — SODIUM CHLORIDE 0.9 % IV SOLN
0.9 | Freq: Every day | INTRAVENOUS | Status: DC
Start: 2024-08-29 — End: 2024-08-29

## 2024-08-29 MED ORDER — VANCOMYCIN 750 MG IN NS 250 ML (PREMIX) IVPB
Freq: Once | Status: AC
Start: 2024-08-29 — End: 2024-08-29
  Administered 2024-08-29: 18:00:00 750 mg via INTRAVENOUS

## 2024-08-29 MED ORDER — PROCHLORPERAZINE EDISYLATE 10 MG/2ML IJ SOLN
10 | Freq: Four times a day (QID) | INTRAMUSCULAR | Status: AC | PRN
Start: 2024-08-29 — End: 2024-08-30
  Administered 2024-08-29 – 2024-08-30 (×2): 5 mg via INTRAVENOUS

## 2024-08-29 MED ORDER — FOLIC ACID 5 MG/ML IJ SOLN
5 | Freq: Every day | INTRAMUSCULAR | Status: AC
Start: 2024-08-29 — End: 2024-09-02
  Administered 2024-08-29 – 2024-09-02 (×5): 1 mg via INTRAVENOUS

## 2024-08-29 MED FILL — MIDODRINE HCL 5 MG PO TABS: 5 mg | ORAL | Qty: 2 | Fill #0

## 2024-08-29 MED FILL — ONDANSETRON HCL 4 MG/2ML IJ SOLN: 4 MG/2ML | INTRAMUSCULAR | Qty: 2 | Fill #0

## 2024-08-29 MED FILL — ATORVASTATIN CALCIUM 40 MG PO TABS: 40 mg | ORAL | Qty: 2 | Fill #0

## 2024-08-29 MED FILL — CLOPIDOGREL BISULFATE 75 MG PO TABS: 75 mg | ORAL | Qty: 1 | Fill #0

## 2024-08-29 MED FILL — FOLIC ACID 5 MG/ML IJ SOLN: 5 mg/mL | INTRAMUSCULAR | Qty: 0.2 | Fill #0

## 2024-08-29 MED FILL — HYDROCODONE-ACETAMINOPHEN 5-325 MG PO TABS: 5-325 mg | ORAL | Qty: 1 | Fill #0

## 2024-08-29 MED FILL — ENOXAPARIN SODIUM 30 MG/0.3ML IJ SOSY: 30 MG/0.3ML | INTRAMUSCULAR | Qty: 0.3 | Fill #0

## 2024-08-29 MED FILL — PROCHLORPERAZINE EDISYLATE 10 MG/2ML IJ SOLN: 10 MG/2ML | INTRAMUSCULAR | Qty: 2 | Fill #0

## 2024-08-29 MED FILL — ACETAMINOPHEN 325 MG PO TABS: 325 mg | ORAL | Qty: 2 | Fill #0

## 2024-08-29 MED FILL — ASPIRIN 81 MG PO TBEC: 81 mg | ORAL | Qty: 1 | Fill #0

## 2024-08-29 MED FILL — CEFEPIME HCL 1 G IJ SOLR: 1 g | INTRAMUSCULAR | Qty: 1000 | Fill #0

## 2024-08-29 MED FILL — VANCOMYCIN 750 MG IN NS 250 ML (PREMIX) IVPB: Qty: 250 | Fill #0

## 2024-08-29 MED FILL — PANTOPRAZOLE SODIUM 40 MG PO TBEC: 40 mg | ORAL | Qty: 1 | Fill #0

## 2024-08-29 MED FILL — MIRABEGRON ER 25 MG PO TB24: 25 mg | ORAL | Qty: 1 | Fill #0

## 2024-08-29 NOTE — Plan of Care (Signed)
"    Problem: Safety - Adult  Goal: Free from fall injury  Outcome: Progressing     "

## 2024-08-29 NOTE — Care Coordination (Signed)
 "Susan [name] Initial Assessment        Home/Support/Situation prior to admission: Home with family assistance and HHC/ is going to ask daughter the name of Agency    Current family/patient discharge plan or goals of care: Patient is in agreement to be discharged back to home with daughter as caregiver and ROC        List DME prior to admission: WC, HB, RW        Home address, phone number, insurance, emergency contacts verified:  yes        PCP:  To, Garnette RAMAN, MD     PCP Confirmed Active: yes                         Agreeable to [facility] OP Pharmacy for DC medications: no    Refill Pharmacy: [facility]                Dialysis patient?  no     Active Medicaid?  yes               Is patient planning to go straight LTC at DC?  no      Agreeable to [facility] and Supportive Care if [facility] is recommended at DC (list alternate agency if agreeable to outside agency):TBD        Agreeable to post-acute care facility if recommended at DC? No       08/29/24 1617   Service Assessment   Information Provided By Patient   Patient Orientation Alert;Oriented;Person;Place;Situation   Cognition Short Term Memory Deficit   Primary Caregiver Self;Family   Support Systems Children   Patient's Healthcare Decision Maker is: Legal Next of Kin  (daughter/ Susan [name]/ [phone])   PCP Verified by CM Yes   Last Visit to PCP Within last 3 months   Prior Functional Level Assistance with the following:;Bathing;Dressing;Toileting;Feeding;Cooking;Housework;Shopping;Mobility   History of falls? 0   Current Functional Level at Time of Initial Assessment Assistance with the following:;Bathing;Dressing;Toileting;Mobility   Can patient return to prior living arrangement Yes   Ability to make needs known: Good   Family able to assist with home care needs: Yes   Receives Help From [facility]   Current Community Resources [facility]  (Daughter is caregiver. Patient believes she is a participant is Engineer, site)   Social/Functional History   Prior Level of Assist for ADLs Needs assistance   Toileting Independent   Prior Level of Assist for [employer] Needs assistance   Homemaking Responsibilities No   Ambulation Assistance Needs assistance   Active Driver No   Patient's Driver Info daughter   Dealer   Occupation Retired   Surveyor, Minerals Prior to Acute Admission House   Lives With Family   Current Services Prior To Admission Horticulturist, Commercial   Current DME Prior to [facility];Wheelchair - Manual   Potential Assistance Needed N/A   Potential Assistance Obtaining Medications No   Patient expects to be discharged to: Home with Home Care  (ROC/ Patient unsure what name of agency is, she will ask daughter)   Home Layout Two level;Performs ADL's on one level;Able to Live on Main level with bedroom/bathroom   Bathroom Accessibility Accessible   Services At/After Discharge   Who will provide transportation at discharge? Family   Condition of Participation: Discharge Planning   The Patient and/or Patient Representative was  provided with a Choice of Provider? Patient   The Patient and/Or Patient Representative agree with the Discharge Plan? Yes   Freedom of Choice list was provided with basic dialogue that supports the patient's individualized plan of care/goals, treatment preferences, and shares the quality data associated with the providers?  Yes   Type of List Provided HH         Signed:   SHELIA JORDAN , Case [name]  August 29, 2024  4:25 PM  Department Phone: [phone]   "

## 2024-08-29 NOTE — Progress Notes (Signed)
"  [facility] Consult: Vancomycin  Dosing - Follow up  Susan [name]  73 y.o.  female  MRN: [id number]        Day # 3 of Vancomycin  maintenance therapy   Indication: Bone and Joint Infection Bilateral wounds are stable without evidence of acute infectious process. The wound beds are granular. There is no deep tracking probing or tunneling to bone. Low concern for acute infectious process.   Other Antimicrobials:  Cefepime   Vancomycin  dose prior to level drawn: Vancomycin  1000mg  IV - 24 hr level   Vancomycin  24hr level: 19.5 mcg/mL which makes this Vancomycin  level therapeutic  AUC/MIC goal: 400-600 mcg*hr/mL  Microbiology data: Blood CX no growth@ 24hr, wound cx in progress   Assessment & Plan     Planning to adjust regimen to Vancomycin  750 mg IV every 24 hours.    Labs / Follow up     Trough History Lab Results   Component Value Date/Time    VANCOTROUGH 19.5 (H) 08/28/2024 05:45 AM      Serum Creatinine Lab Results   Component Value Date    CREATININE 1.69 (H) 08/28/2024    CREATININE 2.49 (H) 08/27/2024      Creatinine Clearance Estimated Creatinine Clearance: 29 mL/min (A) (based on SCr of 1.69 mg/dL (H)).   BUN Lab Results   Component Value Date/Time    BUN 23 08/28/2024 05:45 AM      WBC Lab Results   Component Value Date/Time    WBC 6.4 08/28/2024 05:45 AM        Next Vancomycin  level: 08/30/2024    Pharmacy to make changes to dose and/or frequency based on clinical status.  Susan [name] [facility]   August 29, 2024   [facility]  [phone]     "

## 2024-08-29 NOTE — Progress Notes (Signed)
 "                                                                                                                                                  INFECTIOUS DISEASE FOLLOW UP NOTE       Requested by: Dr. Curt     Reason for consult:Foot ulcer     Date of admission: 08/27/2024     Date of consult: August 28, 2024       ANTIBIOTICS:       Current abx Prior abx    Vancomycin  11/26-2  Cefepime  11/26-2          ASSESSMENT :        Hypotension   Chronic LE wounds   Hx of R foot abscess/ostemyelitis with negative cultures July 2025  -Previously tx with Cefepime  x 6 weeks and Flagyl  x 4 weeks   AKI           RECOMMENDATIONS :     73 year old female with history of PAD, right foot osteomyelitis presented to the hospital 08/27/2024 due to hypotension.  Her blood pressures were being addressed as an outpatient.  Endorses decreased p.o. intake.  ID consulted due to chronic foot wound.  Overall the foot wound appears to be healing, but did have a small amount of drainage and warmth on palpation 11/27 so obtained a culture.  Less likely that infection is causing her hypotension given that she is afebrile with normal WBC, normal procalcitonin.     - Follow-up wound culture and blood cultures.  Podiatry following.  Check ESR/CRP.  Do not anticipate that she will require a long course of antibiotics.  Can continue Vanco and cefepime  renally adjusted for now. Likely short course of 5 days, can switch to PO on discharge based on wound culture results.             ANN OLDEN, DO  August 29, 2024    [facility]   Office Phone:[phone]  Fax:[phone]      MICROBIOLOGY:   Microbiology Results  08/27/24 BCX NGTD  08/28/24 Wound culture pending    LINES/CATHETERS:   PIV    SUMMARY:   73 year old female with history of hypertension, COPD on 2 L of home oxygen, right foot abscess/osteomyelitis presented to the hospital 08/27/2024 due to hypotension.  States her blood pressure has been getting  lower for the past few weeks.  Initially was on 3 blood pressure medications and was taken off of 1 a week prior to arrival.  Denies any other symptoms.  Has chronic leg pain.  Per daughter at bedside the wounds in her bilateral feet are being addressed by wound care and appear to be healing well.     Was hospitalized in July/August 2025 due to a right foot abscess.  Had I&D done and cultures were unrevealing.  Pathology was consistent with acute on chronic osteomyelitis.  She was ultimately treated empirically with 6 weeks of cefepime  and 4 weeks of metronidazole .  Has been following up with wound care since and been doing well.     Currently on vancomycin  and cefepime  which was started on admission.  No fevers or leukocytosis.    SUBJECTIVE :     Interval notes reviewed.    Endorses continued leg pain    OBJECTIVE     BP (!) 92/49   Pulse 89   Temp 97.6 F (36.4 C) (Temporal)   Resp 19   Ht 1.702 m (5' 7)   Wt 67.5 kg (148 lb 14.4 oz)   SpO2 100%   BMI 23.32 kg/m     Temp (24hrs), Avg:97.5 F (36.4 C), Min:97.1 F (36.2 C), Max:97.8 F (36.6 C)      General: Well-developed, 73 y.o. year-old, female, appears uncomfortable from LE pain  HEENT: Normocephalic, anicteric sclerae, Pupils equal, round reactive to light, no oropharyngeal lesions.    Neck: Supple,    Chest: Symmetrical expansion  Lungs: Clear to auscultation bilaterally, no dullness  Heart: Regular rhythm,   Abdomen: Soft, non-tender,non distended, no organomegaly, BS+  Musculoskeletal: No edema. No clubbing or cyanosis  SKIN: Chronic lower extremity foot wounds.  See photos.  No drainage today. Some surrounding warmth and tenderness to palpation around wound on R heel.       MEDICATIONS:     Current Facility-Administered Medications   Medication Dose Route Frequency Provider Last Rate Last Admin    prochlorperazine  (COMPAZINE ) injection 5 mg  5 mg IntraVENous Q6H PRN Dannielle Curtistine LABOR, MD   5 mg at 08/29/24 0018    folic acid  injection 1 mg   1 mg IntraVENous Daily Mehmood, Khalid, MD        vancomycin  (VANCOCIN ) 750 mg in sodium chloride  0.9 % 250 mL IVPB  750 mg IntraVENous Once Lonnie Passy, MD        NOREEN ON 08/30/2024] Vancomycin  Level Due   Other Once Lonnie Passy, MD        0.9 % sodium chloride  infusion   IntraVENous PRN Mehmood, Khalid, MD        HYDROcodone -acetaminophen  (NORCO ) 5-325 MG per tablet 1 tablet  1 tablet Oral Q6H PRN Mehmood, Belynda, MD   1 tablet at 08/29/24 0514    [Held by provider] amLODIPine  (NORVASC ) tablet 5 mg  5 mg Oral Daily Verma, Sourabh, MD        aspirin  EC tablet 81 mg  81 mg Oral Daily Verma, Sourabh, MD   81 mg at 08/29/24 9164    atorvastatin  (LIPITOR ) tablet 80 mg  80 mg Oral QHS Verma, Sourabh, MD   80 mg at 08/28/24 2016    clopidogrel  (PLAVIX ) tablet 75 mg  75 mg Oral Daily Verma, Sourabh, MD   75 mg at 08/29/24 0835    ipratropium 0.5 mg-albuterol  2.5 mg (DUONEB ) nebulizer solution 1 Dose  1 Dose Inhalation Q4H PRN Verma, Sourabh, MD        mirabegron  (MYRBETRIQ ) extended release tablet 25 mg  25 mg Oral Daily Verma, Sourabh, MD   25 mg at 08/29/24 0835    pantoprazole  (PROTONIX ) tablet 40 mg  40 mg Oral Daily Verma, Sourabh, MD   40 mg at 08/29/24 9164    tiotropium bromide  (SPIRIVA  RESPIMAT) 2.5 MCG/ACT inhaler 2 puff  2 puff Inhalation Daily RT Lonnie Passy, MD   2 puff at 08/29/24 [phone]    [  Held by provider] valsartan  (DIOVAN ) tablet 160 mg  160 mg Oral BID Lonnie Passy, MD        sodium chloride  flush 0.9 % injection 5-40 mL  5-40 mL IntraVENous 2 times per day Lonnie Passy, MD   5 mL at 08/29/24 0844    sodium chloride  flush 0.9 % injection 5-40 mL  5-40 mL IntraVENous PRN Lonnie Passy, MD        0.9 % sodium chloride  infusion   IntraVENous PRN Lonnie Passy, MD        enoxaparin  Sodium (LOVENOX ) injection 30 mg  30 mg SubCUTAneous Daily Lonnie Passy, MD   30 mg at 08/29/24 9164    ondansetron  (ZOFRAN -ODT) disintegrating tablet 4 mg  4 mg Oral Q8H PRN Lonnie Passy, MD        Or     ondansetron  (ZOFRAN ) injection 4 mg  4 mg IntraVENous Q6H PRN Lonnie Passy, MD   4 mg at 08/28/24 2100    polyethylene glycol (GLYCOLAX ) packet 17 g  17 g Oral Daily PRN Lonnie Passy, MD        acetaminophen  (TYLENOL ) tablet 650 mg  650 mg Oral Q6H PRN Lonnie Passy, MD   650 mg at 08/29/24 9071    Or    acetaminophen  (TYLENOL ) suppository 650 mg  650 mg Rectal Q6H PRN Lonnie Passy, MD        midodrine  (PROAMATINE ) tablet 10 mg  10 mg Oral TID WC Lonnie Passy, MD   10 mg at 08/29/24 0835    cefepime  (MAXIPIME ) 1,000 mg in sodium chloride  0.9 % 50 mL IVPB (mini-bag)  1,000 mg IntraVENous Q24H Lonnie Passy, MD   Stopped at 08/28/24 1741    fluticasone  furoate-vilanterol (BREO ELLIPTA ) 100-25 MCG/ACT inhaler 1 puff  1 puff Inhalation Daily RT Lonnie Passy, MD   1 puff at 08/29/24 0842    [Held by provider] carvedilol  (COREG ) tablet 6.25 mg  6.25 mg Oral BID WC Lonnie Passy, MD        *Draw vancomycin  trough on 11/27 w/ AM labs @ 0600  1 each Other Once Lonnie Passy, MD        *Vancomycin  dosing per pharmacy ([facility])  1 each Other RX Placeholder Lonnie Passy, MD             Labs: Results:   Chemistry Recent Labs     08/27/24  1242 08/28/24  0545   NA 132* 137   K 4.5 3.9   CL 95* 102   CO2 28 24   BUN 27* 23      CBC w/Diff Recent Labs     08/27/24  1242 08/28/24  0545 08/28/24  0812 08/28/24  2045   WBC 7.1 6.4  --   --    RBC 3.00* 2.30*  --   --    HGB 8.8* 6.8* 7.0* 8.7*   HCT 27.4* 20.8* 21.6* 26.0*   PLT 446 334  --   --                  RADIOLOGY :      CXR 08/27/24  IMPRESSION:      No radiographic evidence of acute cardiopulmonary disease.     I have independently reviewed lab studies and imaging as well as review of nursing notes and physican notes from the past 24 hours.    Dragon medical dictation software was used for portions of this report. Unintended errors may occur.                "

## 2024-08-29 NOTE — Wound Care (Signed)
"  Received a Wound Care consult for BLE. I attempted to see patient. I introduced myself to patient and explained the purpose of my visit. Patient states, they just looked at it. Come back later. Patient has orders for dressing change written by podiatrist, Dr. Dorise Later on 08/28/24 - Dressings: Continue with Exufiber/Aquacel Ag and a dry sterile dressing. The Wound Care team will attempt to see patient as our schedule allows. Priamry Nurse notified.    Thank you,      Eleanor Carol, BSN, RN   [facility]   Inpatient Wound, Ostomy, Continence Nurse            "

## 2024-08-29 NOTE — Progress Notes (Signed)
"  Peripheral Vascular Lab Preliminary : Ankle Brachial Index     1.  Mild arterial insufficiency in the right  lower extremity.   2. Moderate arterial insufficiency in the left lower extremity.   3. Digital insufficiency is present in the left lower extremity. Could not obtain right digital pressure due to patient pain, flow is present in the right first digit.     Right ABI = 0.87  Left ABI = 0.71    Final report to follow  [facility] RVT  "

## 2024-08-29 NOTE — Progress Notes (Signed)
 "Medicine Progress Note    Date of Admission: 08/27/2024  Length of Stay: 2    Assessment And Plan     Suspect sepsis  Cellulitis right foot  Acute worsening of chronic anemia  AKI  COPD  Chronic hypoxic respiratory failure  Bipolar disorder  PAD    Treatment plan  Patient admitted with suspect sepsis.  Patient was hypotensive admission.  Improved with IV hydration.  Continue IV antibiotic.  Follow-up on culture.  Wound care consulted.  Infectious disease consulted.  Hemoglobin improved after transfusion.  Repeat CBC tomorrow.  PVL lower extremity ordered.  Infectious disease and podiatry consulted.  Check iron studies, B12 and folate.  Check stool for occult blood.  Continue Protonix .  BMP sodium 133 potassium 3.9 BUN 14 creatinine 1.20.  Daughter updated at bedside.  Continue IV fluids.  Continue oxygen.      Discussed with patient. Updates regarding diagnose, tests results, prognosis, treatment  of the patient's medical conditions discussed in detail     Subjective:     Complaining of pain in both feet.  Hypertension is better now.  Still feeling very weak.     Objective:     Patient Vitals for the past 12 hrs:   Temp Pulse Resp BP SpO2   08/29/24 1600 -- 62 11 (!) 117/56 100 %   08/29/24 1400 -- 65 14 (!) 97/50 --   08/29/24 1249 -- -- 18 -- --   08/29/24 1200 -- 56 13 (!) 114/51 100 %   08/29/24 1130 97.4 F (36.3 C) -- -- -- --   08/29/24 0800 97.6 F (36.4 C) 89 19 (!) 92/49 100 %   08/29/24 0730 97.7 F (36.5 C) -- -- -- --         Intake/Output Summary (Last 24 hours) at 08/29/2024 1838  Last data filed at 08/29/2024 9141  Gross per 24 hour   Intake 480 ml   Output 300 ml   Net 180 ml     Constitutional: Awake and alert, NAD  HENT: atraumatic, normocephalic, oropharynx clear    Eyes:   conjunctiva normal  Neck:   supple and trachea normal  Cardiovascular:  Regular rate and rhythm, heart sounds normal, intact distal pulses  Pulmonary/Chest Wall: breath sounds normal and effort normal  Abdominal:       appearance normal, soft, non-tender upon palpation,bowel sounds normal.  Neurological:   awake, alert and oriented, CN intact, generalized weakness..  Extremities:   Right foot dressing.          Recent Results (from the past 48 hours)   Lactate, Sepsis    Collection Time: 08/27/24  9:12 PM   Result Value Ref Range    Lactate 1.0 0.5 - 2.2 mmol/L   CBC with Auto Differential    Collection Time: 08/28/24  5:45 AM   Result Value Ref Range    WBC 6.4 4.0 - 11.0 1000/mm3    RBC 2.30 (L) 3.60 - 5.20 M/uL    Hemoglobin 6.8 (LL) 11.0 - 16.0 gm/dl    Hematocrit 79.1 (LL) 35.0 - 47.0 %    MCV 90.4 80.0 - 98.0 fL    MCH 29.6 25.4 - 34.6 pg    MCHC 32.7 30.0 - 36.0 gm/dl    Platelets 665 859 - 450 1000/mm3    MPV 9.4 6.0 - 10.0 fL    RDW 51.1 (H) 36.4 - 46.3      Nucleated RBCs 0 0 - 0  Immature Granulocytes % 0.2 0.0 - 3.0 %    Neutrophils Segmented 70.7 (H) 34 - 64 %    Lymphocytes 17.3 (L) 28 - 48 %    Monocytes 10.2 1 - 13 %    Eosinophils 1.3 0 - 5 %    Basophils 0.3 0 - 3 %   Comprehensive Metabolic Panel w/ Reflex to MG    Collection Time: 08/28/24  5:45 AM   Result Value Ref Range    Potassium 3.9 3.5 - 5.1 mEq/L    Chloride 102 98 - 107 mEq/L    Sodium 137 136 - 145 mEq/L    CO2 24 20 - 31 mEq/L    Glucose 103 74 - 106 mg/dl    BUN 23 9 - 23 mg/dl    Creatinine 8.30 (H) 0.55 - 1.02 mg/dl    Calcium  8.8 8.7 - 10.4 mg/dl    Anion Gap 11 5 - 15 mmol/L    AST 10.0 0.0 - 33.9 U/L    ALT <7 (L) 10 - 49 U/L    Alkaline Phosphatase 56 46 - 116 U/L    Total Bilirubin 0.30 0.30 - 1.20 mg/dl    Total Protein 6.9 5.7 - 8.2 gm/dl    Albumin  2.8 (L) 3.4 - 5.0 gm/dl   Vancomycin  Level, Trough    Collection Time: 08/28/24  5:45 AM   Result Value Ref Range    Vancomycin  Tr 19.5 (H) 5.0 - 10.0 mcg/ml   Hemoglobin and Hematocrit    Collection Time: 08/28/24  8:12 AM   Result Value Ref Range    Hemoglobin 7.0 (L) 11.0 - 16.0 gm/dl    Hematocrit 78.3 (L) 35.0 - 47.0 %   Iron and TIBC    Collection Time: 08/28/24  8:12 AM   Result  Value Ref Range    Iron 10 (L) 50 - 170 mcg/dl    TIBC 866 (L) 749 - 574 mcg/dl    % SATURATION 8 (L) 20 - 45 %   Folate    Collection Time: 08/28/24  8:12 AM   Result Value Ref Range    Folate 4.30 (L) 5.38 - 24.00 ng/ml   Ferritin    Collection Time: 08/28/24  8:12 AM   Result Value Ref Range    Ferritin 265.7 7.3 - 270.7 ng/ml   Vitamin B12    Collection Time: 08/28/24  8:12 AM   Result Value Ref Range    Vitamin B-12 501 211 - 911 pg/ml   Culture, Anaerobic    Collection Time: 08/28/24 11:30 AM    Specimen: Other   Result Value Ref Range    Culture Result Culture In Progress, Daily Updates To Follow     Culture, Wound W Gram Stain    Collection Time: 08/28/24 11:30 AM    Specimen: Wound   Result Value Ref Range    Gram Stain Result Rare WBC'S  No Organisms Seen        Isolate (A)       Moderate  Staphylococcus Species Isolated  Identification And Susceptibility To Follow     PREPARE RBC (CROSSMATCH), 1 Units    Collection Time: 08/28/24 12:30 PM   Result Value Ref Range    Blood unit identifier Red Blood Cells      Unit Number [id number]      Crossmatch Result Compatible      Dispense Status Blood Bank Transfused      Product Code Blood Bank [id number]  Blood Type 6200      Unit Issue Date/Time [id number]      Specimen Expiration [id number]     ABO/RH    Collection Time: 08/28/24 12:51 PM   Result Value Ref Range    ABO/Rh A Rh Positive     ANTIBODY SCREEN    Collection Time: 08/28/24 12:51 PM   Result Value Ref Range    Antibody Screen NEG     Hemoglobin and Hematocrit    Collection Time: 08/28/24  8:45 PM   Result Value Ref Range    Hemoglobin 8.7 (L) 11.0 - 16.0 gm/dl    Hematocrit 73.9 (L) 35.0 - 47.0 %   CBC with Auto Differential    Collection Time: 08/29/24  9:27 AM   Result Value Ref Range    WBC 6.3 4.0 - 11.0 1000/mm3    RBC 3.08 (L) 3.60 - 5.20 M/uL    Hemoglobin 9.1 (L) 11.0 - 16.0 gm/dl    Hematocrit 72.1 (L) 35.0 - 47.0 %    MCV 90.3 80.0 - 98.0 fL    MCH 29.5 25.4 - 34.6 pg    MCHC 32.7  30.0 - 36.0 gm/dl    Platelets 573 859 - 450 1000/mm3    MPV 9.7 6.0 - 10.0 fL    RDW 51.2 (H) 36.4 - 46.3      Nucleated RBCs 0 0 - 0      Immature Granulocytes % 0.3 0.0 - 3.0 %    Neutrophils Segmented 66.2 (H) 34 - 64 %    Lymphocytes 22.0 (L) 28 - 48 %    Monocytes 9.6 1 - 13 %    Eosinophils 1.6 0 - 5 %    Basophils 0.3 0 - 3 %   Basic Metabolic Panel    Collection Time: 08/29/24  9:27 AM   Result Value Ref Range    Potassium 3.9 3.5 - 5.1 mEq/L    Chloride 101 98 - 107 mEq/L    Sodium 133 (L) 136 - 145 mEq/L    CO2 23 20 - 31 mEq/L    Glucose 133 (H) 74 - 106 mg/dl    BUN 14 9 - 23 mg/dl    Creatinine 8.79 (H) 0.55 - 1.02 mg/dl    GFR African American 57.0      GFR Non-African American 47      Calcium  9.2 8.7 - 10.4 mg/dl    Anion Gap 9 5 - 15 mmol/L   Sedimentation Rate    Collection Time: 08/29/24  9:27 AM   Result Value Ref Range    Sed Rate, Automated 82 (H) 0 - 30 mm/Hr   C-Reactive Protein    Collection Time: 08/29/24  9:27 AM   Result Value Ref Range    CRP 109.7 (H) 0.0 - 5.0 mg/L   Vancomycin  Level, Trough    Collection Time: 08/29/24  9:27 AM   Result Value Ref Range    Vancomycin  Tr 19.4 (H) 5.0 - 10.0 mcg/ml   MRSA by PCR    Collection Time: 08/29/24 12:27 PM    Specimen: Nasal   Result Value Ref Range    MRSA PCR screen POSITIVE (A) NEGATIVE           Labs in Last 3 months: No results found for: TSH, VITD25, PSA, INR, GLUF, NTPROBNP, LABA1C, MALBCR       Lipids: No results for input(s): CHOL, HDL in the last 72 hours.    Invalid input(s): LDLCALCU  INR: No results for input(s): INR in the last 72 hours.        Microbiology-  Urine Cx: No results found for: LABURIN  Blood Cx: No results found for: BC  Sputum Cx: No results found for: RESPCULTURE  Gram Stain:   Lab Results   Component Value Date    LABGRAM Rare WBC'S  No Organisms Seen   08/28/2024     PNA PCR: No results found for: PNPCRPNL  COVID19: No results found for: COVID19  Legionella Ag: No results found  for: LEGIONELLAANTIGEN  Strep Ag: No results for input(s): STREPNEUMAGU in the last 72 hours.    Radiology reports as per the Radiologist  Radiology: XR CHEST (2 VW)  Result Date: 08/27/2024  CLINICAL INDICATION: Chest pain Pain. TECHNIQUE:  PA and lateral chest radiographs. FINDINGS:  The heart size is within normal limits. The mediastinal contours are unremarkable. The lungs are clear and the costophrenic angles are sharp. The osseous structures of the thorax are intact. There is a disc space narrowing and bridging osteophytes at several levels in the thoracic spine. There is no significant change compared with the 04/15/2024 chest x-ray.     IMPRESSION:  No radiographic evidence of acute cardiopulmonary disease. Electronically signed by: Lynwood Harden, MD 08/27/2024 12:56 PM EST          Workstation ID: [id number]                  No results found for this or any previous visit.      No results found for this or any previous visit.          Current Facility-Administered Medications   Medication Dose Route Frequency    prochlorperazine  (COMPAZINE ) injection 5 mg  5 mg IntraVENous Q6H PRN    folic acid  injection 1 mg  1 mg IntraVENous Daily    [START ON 08/30/2024] Vancomycin  Level Due   Other Once    0.9 % sodium chloride  infusion   IntraVENous PRN    HYDROcodone -acetaminophen  (NORCO ) 5-325 MG per tablet 1 tablet  1 tablet Oral Q6H PRN    [Held by provider] amLODIPine  (NORVASC ) tablet 5 mg  5 mg Oral Daily    aspirin  EC tablet 81 mg  81 mg Oral Daily    atorvastatin  (LIPITOR ) tablet 80 mg  80 mg Oral QHS    clopidogrel  (PLAVIX ) tablet 75 mg  75 mg Oral Daily    ipratropium 0.5 mg-albuterol  2.5 mg (DUONEB ) nebulizer solution 1 Dose  1 Dose Inhalation Q4H PRN    mirabegron  (MYRBETRIQ ) extended release tablet 25 mg  25 mg Oral Daily    pantoprazole  (PROTONIX ) tablet 40 mg  40 mg Oral Daily    tiotropium bromide  (SPIRIVA  RESPIMAT) 2.5 MCG/ACT inhaler 2 puff  2 puff Inhalation Daily RT    [Held by provider]  valsartan  (DIOVAN ) tablet 160 mg  160 mg Oral BID    sodium chloride  flush 0.9 % injection 5-40 mL  5-40 mL IntraVENous 2 times per day    sodium chloride  flush 0.9 % injection 5-40 mL  5-40 mL IntraVENous PRN    0.9 % sodium chloride  infusion   IntraVENous PRN    enoxaparin  Sodium (LOVENOX ) injection 30 mg  30 mg SubCUTAneous Daily    ondansetron  (ZOFRAN -ODT) disintegrating tablet 4 mg  4 mg Oral Q8H PRN    Or    ondansetron  (ZOFRAN ) injection 4 mg  4 mg IntraVENous Q6H PRN    polyethylene glycol (GLYCOLAX ) packet 17  g  17 g Oral Daily PRN    acetaminophen  (TYLENOL ) tablet 650 mg  650 mg Oral Q6H PRN    Or    acetaminophen  (TYLENOL ) suppository 650 mg  650 mg Rectal Q6H PRN    midodrine  (PROAMATINE ) tablet 10 mg  10 mg Oral TID WC    cefepime  (MAXIPIME ) 1,000 mg in sodium chloride  0.9 % 50 mL IVPB (mini-bag)  1,000 mg IntraVENous Q24H    fluticasone  furoate-vilanterol (BREO ELLIPTA ) 100-25 MCG/ACT inhaler 1 puff  1 puff Inhalation Daily RT    [Held by provider] carvedilol  (COREG ) tablet 6.25 mg  6.25 mg Oral BID WC    *Draw vancomycin  trough on 11/27 w/ AM labs @ 0600  1 each Other Once    *Vancomycin  dosing per pharmacy ([facility])  1 each Other RX Placeholder            Dragon medical dictation software was used for portions of this report. Unintended errors may occur.   BELYNDA HOOF, MD  08/29/2024             "

## 2024-08-29 NOTE — Progress Notes (Signed)
"  Chaplain Services  Unable to Visit Patient    Start Time: 1400  End Time: 1405    Chaplain attempted to conduct a Consultation and Spiritual Assessment for Susan [name], who is a 73 y.o.,female.  According to the patient's EMR Religious Affiliation is: None.     Patient is with staff and is not available to be assessed at this time.  Family present.  Offered prayer remotely on patient and families behalf.     Assessment:  Patient has no known religious/cultural needs that will affect patient's preferences in health care.  Patient has no known spiritual or religious issues which require intervention at this time.     Plan:  Chaplains will continue to follow and will provide pastoral care on an as needed/requested basis.  Chaplain recommends bedside caregivers page Duty Chaplain if patient shows signs of acute spiritual or emotional distress.     KEVEN JINNY ANGELO Elia  Spiritual Care  [phone]  "

## 2024-08-30 LAB — CBC WITH AUTO DIFFERENTIAL
Basophils: 0.4 % (ref 0–3)
Eosinophils: 1.9 % (ref 0–5)
Hematocrit: 27.4 % — ABNORMAL LOW (ref 35.0–47.0)
Hemoglobin: 8.9 g/dL — ABNORMAL LOW (ref 11.0–16.0)
Immature Granulocytes %: 0.4 % (ref 0.0–3.0)
Lymphocytes: 25.5 % — ABNORMAL LOW (ref 28–48)
MCH: 29.2 pg (ref 25.4–34.6)
MCHC: 32.5 g/dL (ref 30.0–36.0)
MCV: 89.8 fL (ref 80.0–98.0)
MPV: 9.2 fL (ref 6.0–10.0)
Monocytes: 9.7 % (ref 1–13)
Neutrophils Segmented: 62.1 % (ref 34–64)
Nucleated RBCs: 0 (ref 0–0)
Platelets: 427 1000/mm3 (ref 140–450)
RBC: 3.05 M/uL — ABNORMAL LOW (ref 3.60–5.20)
RDW: 50.5 — ABNORMAL HIGH (ref 36.4–46.3)
WBC: 7.2 1000/mm3 (ref 4.0–11.0)

## 2024-08-30 LAB — BASIC METABOLIC PANEL
Anion Gap: 10 mmol/L (ref 5–15)
BUN: 16 mg/dL (ref 9–23)
CO2: 25 meq/L (ref 20–31)
Calcium: 9.2 mg/dL (ref 8.7–10.4)
Chloride: 103 meq/L (ref 98–107)
Creatinine: 1.09 mg/dL — ABNORMAL HIGH (ref 0.55–1.02)
GFR African American: >60
GFR Non-African American: 52
Glucose: 90 mg/dL (ref 74–106)
Potassium: 4.1 meq/L (ref 3.5–5.1)
Sodium: 138 meq/L (ref 136–145)

## 2024-08-30 LAB — VANCOMYCIN LEVEL, TROUGH: Vancomycin Tr: 18.3 ug/mL — ABNORMAL HIGH (ref 5.0–10.0)

## 2024-08-30 MED ORDER — AMLODIPINE BESYLATE 5 MG PO TABS
5 | Freq: Every day | ORAL | Status: DC
Start: 2024-08-30 — End: 2024-09-02
  Administered 2024-08-30 – 2024-09-01 (×3): 5 mg via ORAL

## 2024-08-30 MED ORDER — VANCOMYCIN INTERMITTENT DOSING (PLACEHOLDER)
Freq: Once | INTRAVENOUS | Status: DC
Start: 2024-08-30 — End: 2024-09-01

## 2024-08-30 MED ORDER — VANCOMYCIN HCL 1 G IV SOLR
1 | INTRAVENOUS | Status: DC
Start: 2024-08-30 — End: 2024-08-31
  Administered 2024-08-30: 19:00:00 750 mg via INTRAVENOUS

## 2024-08-30 MED ORDER — CARVEDILOL 6.25 MG PO TABS
6.25 | Freq: Two times a day (BID) | ORAL | Status: DC
Start: 2024-08-30 — End: 2024-09-01
  Administered 2024-08-30 – 2024-09-01 (×4): 6.25 mg via ORAL

## 2024-08-30 MED FILL — PROCHLORPERAZINE EDISYLATE 10 MG/2ML IJ SOLN: 10 MG/2ML | INTRAMUSCULAR | Qty: 2 | Fill #0

## 2024-08-30 MED FILL — CLOPIDOGREL BISULFATE 75 MG PO TABS: 75 mg | ORAL | Qty: 1 | Fill #0

## 2024-08-30 MED FILL — MIDODRINE HCL 5 MG PO TABS: 5 mg | ORAL | Qty: 2 | Fill #0

## 2024-08-30 MED FILL — ATORVASTATIN CALCIUM 40 MG PO TABS: 40 mg | ORAL | Qty: 2 | Fill #0

## 2024-08-30 MED FILL — FOLIC ACID 5 MG/ML IJ SOLN: 5 mg/mL | INTRAMUSCULAR | Qty: 0.2 | Fill #0

## 2024-08-30 MED FILL — AMLODIPINE BESYLATE 5 MG PO TABS: 5 mg | ORAL | Qty: 1 | Fill #0

## 2024-08-30 MED FILL — ONDANSETRON HCL 4 MG/2ML IJ SOLN: 4 MG/2ML | INTRAMUSCULAR | Qty: 2 | Fill #0

## 2024-08-30 MED FILL — CARVEDILOL 6.25 MG PO TABS: 6.25 mg | ORAL | Qty: 1 | Fill #0

## 2024-08-30 MED FILL — HYDROCODONE-ACETAMINOPHEN 5-325 MG PO TABS: 5-325 mg | ORAL | Qty: 1 | Fill #0

## 2024-08-30 MED FILL — PANTOPRAZOLE SODIUM 40 MG PO TBEC: 40 mg | ORAL | Qty: 1 | Fill #0

## 2024-08-30 MED FILL — MIRABEGRON ER 25 MG PO TB24: 25 mg | ORAL | Qty: 1 | Fill #0

## 2024-08-30 MED FILL — ACETAMINOPHEN 325 MG PO TABS: 325 mg | ORAL | Qty: 2 | Fill #0

## 2024-08-30 MED FILL — VANCOMYCIN HCL 1 G IV SOLR: 1 g | INTRAVENOUS | Qty: 750 | Fill #0

## 2024-08-30 MED FILL — LOVENOX 30 MG/0.3ML IJ SOSY: 30 MG/0.3ML | INTRAMUSCULAR | Qty: 0.3 | Fill #0

## 2024-08-30 MED FILL — ASPIRIN LOW DOSE 81 MG PO TBEC: 81 mg | ORAL | Qty: 1 | Fill #0

## 2024-08-30 NOTE — Progress Notes (Signed)
"  PAGER ID: [phone]  MESSAGE: Room: 6628 Truly, Stankiewicz. MD Mehmood. DX sepsis, Pain from Wounds in Both feet. Patient states Norco  is not helping for pain and cannot rest due to pain. Requesting something for Sleep. EXT: [id number]   "

## 2024-08-30 NOTE — Progress Notes (Signed)
 "  NUTRITION RECOMMENDATIONS:   Recommend continue 2g sodium diet  Recommend Mighty Shake BID (provides 200 kcal, 7 g protein each)   Monitor electrolytes and replete as needed  Recommend weights 3x weekly to trend   Discharge recommendations: regular diet    NUTRITION INITIAL EVALUATION    NUTRITION ASSESSMENT:  Consult: wounds     Principal Diagnosis:  Hypotension     PMH:   Past Medical History:   Diagnosis Date    Hyperlipidemia     Hypertension        Pertinent Medications:  Lipitor (FDI), coreg (FDI), folic acid , protonix , zofran  PRN    Pertinent Labs:  reviewed.  Lab Results   Component Value Date    NA 138 08/30/2024    K 4.1 08/30/2024    CL 103 08/30/2024    CO2 25 08/30/2024    BUN 16 08/30/2024    CREATININE 1.09 (H) 08/30/2024    GLUCOSE 90 08/30/2024    CALCIUM  9.2 08/30/2024    BILITOT 0.30 08/28/2024    ALKPHOS 56 08/28/2024    AST 10.0 08/28/2024    ALT <7 (L) 08/28/2024    LABGLOM 52 08/30/2024    GFRAA >60.0 08/30/2024     Anthropometrics:  Height:   Ht Readings from Last 3 Encounters:   08/27/24 1.702 m (5' 7)   04/25/24 1.702 m (5' 7)   04/08/24 1.702 m (5' 7)      Weight:   Wt Readings from Last 3 Encounters:   08/30/24 67.5 kg (148 lb 12.8 oz)   04/25/24 81.6 kg (179 lb 14.3 oz)   04/08/24 81.6 kg (180 lb)     BMI: Body mass index is 23.31 kg/m.               UBW: per EMR, 81.6 kg  Wt Change: Showing -17.3% wt loss x 4 months per EMR   Current Diet Order: ADULT DIET; Regular; Low Sodium (2 gm)  GI Symptoms/Issues: none noted per MD  Chewing/Swallowing Issues: none noted, regular diet   Skin Integrity: wound to L foot, vascular ulcer to R foot, wound to LLE  Fluid Accumulation/Edema Documentation per Nursing Flowsheets:   Edema: None        Per I/Os, pt is +0.8 L since admit.    Physical Assessment: deferred, pt unavailable     Diet and Intake History:   Food Allergies:  NKFA    Subjective:   unable to obtain diet hx, pt w/ other providers at time of visit.      Current Appetite/PO Intake:  Most recent documentation in flow sheets dated 11/28, pt with  PO Meals Eaten (%): 76 - 100% intake.   Provides approximately 1700 kcal (100%), 90 g protein (100%) daily.      GLIM Criteria Met:   [x]  Weight Loss  ([]  >5-10 % in 6 mos / 10-10% >6 mos) or ([x]  >10% in 6 mos / >20% >6 mos)    [x]  Inflammation (Specify): Alb 2.8(L)    Estimated Daily Nutrition Needs:      67.5 kg   -- []    Admit  [x]  Actual  []  Ideal   1687 - 2025 kcals (25-30 kcal/kg)   68 - 81 g protein (1-1.2 g/kg)   1687 mL fluid (25 ml/kg or per MD)     Assessment of Current MNT:   Diet is clinically appropriate and current intake is adequate to meet estimated nutritional needs.       NUTRITION  DIAGNOSIS:     Unintentional weight loss related to unknown etiology as evidenced by -17.3% wt loss x 4 months.     NUTRITION INTERVENTION / RECOMMENDATIONS:     Recommend continue 2g sodium diet  Recommend Mighty Shake BID (provides 200 kcal, 7 g protein each)   Monitor electrolytes and replete as needed  Recommend weights 3x weekly to trend   Discharge recommendations: regular diet    NUTRITION MONITORING AND EVALUATION:     Nutrition Level of Care:   high    Monitoring and Evaluation: PO intake, wt trends, nutrition related labs, skin integrity, and if applicable, supplement acceptance and/or nutrition support tolerance.     Nutrition Goals:  Pt to meet/tolerate >/= 75% of estimated needs, weight maintenance throughout LOS, BG control <180, Improvement and/or maintenance of skin integrity and nutrition related labs.     Code Status: Full Code     TINNIE LOISE ROSA, RD   08/30/24   Office: Ext [phone]    "

## 2024-08-30 NOTE — Progress Notes (Signed)
"  TRANSFER - IN REPORT:    Telephone report received from [place] on Susan [name]  being received from SDU for routine progression of patient care      Report consisted of patient's Situation, Background, Assessment and   Recommendations(SBAR).     Information from the following report(s) Adult Overview, Surgery Report, MAR, Recent Results, Cardiac Rhythm NSR, Quality Measures, and Neuro Assessment was reviewed with the receiving nurse.    Opportunity for questions and clarification was provided.      Assessment completed upon patient's arrival to unit and care assumed.   "

## 2024-08-30 NOTE — Plan of Care (Signed)
"    Problem: Safety - Adult  Goal: Free from fall injury  08/30/2024 2306 by Delores Tinnie Knee, RN  Outcome: Progressing  08/30/2024 1820 by Germaine Era HERO, LPN  Outcome: Progressing     Problem: Pain  Goal: Verbalizes/displays adequate comfort level or baseline comfort level  08/30/2024 2306 by Delores Tinnie Knee, RN  Outcome: Progressing  08/30/2024 1820 by Germaine Era HERO, LPN  Outcome: Progressing     Problem: Skin/Tissue Integrity  Goal: Skin integrity remains intact  Description: 1.  Monitor for areas of redness and/or skin breakdown  2.  Assess vascular access sites hourly  3.  Every 4-6 hours minimum:  Change oxygen saturation probe site  4.  Every 4-6 hours:  If on nasal continuous positive airway pressure, respiratory therapy assess nares and determine need for appliance change or resting period  08/30/2024 2306 by Delores Tinnie Knee, RN  Outcome: Progressing  08/30/2024 1820 by Germaine Era HERO, LPN  Outcome: Progressing     Problem: Pain  Goal: Verbalizes/displays adequate comfort level or baseline comfort level  08/30/2024 2306 by Delores Tinnie Knee, RN  Outcome: Progressing  08/30/2024 1820 by Germaine Era HERO, LPN  Outcome: Progressing     "

## 2024-08-30 NOTE — Progress Notes (Signed)
"-  Transferred safely and Endorsed accordingly at bedside.  "

## 2024-08-30 NOTE — Plan of Care (Signed)
"    Problem: Safety - Adult  Goal: Free from fall injury  Outcome: Progressing     Problem: Pain  Goal: Verbalizes/displays adequate comfort level or baseline comfort level  Outcome: Progressing     Problem: Skin/Tissue Integrity  Goal: Skin integrity remains intact  Description: 1.  Monitor for areas of redness and/or skin breakdown  2.  Assess vascular access sites hourly  3.  Every 4-6 hours minimum:  Change oxygen saturation probe site  4.  Every 4-6 hours:  If on nasal continuous positive airway pressure, respiratory therapy assess nares and determine need for appliance change or resting period  Outcome: Progressing     "

## 2024-08-30 NOTE — Progress Notes (Signed)
"  Wound cultures growing Staph aureus.  MRSA nares positive.  Cefepime  discontinued.  Continue vancomycin .  Follow-up sensitivities.  Continue wound care.  Please call if any ID related questions.    Ann Olden, DO  Infectious Disease  [facility]    "

## 2024-08-30 NOTE — Progress Notes (Signed)
"  [facility] Consult: Vancomycin  Dosing - Follow up  Susan [name]  73 y.o.  female  MRN: [id number]        Day # 3 of Vancomycin  maintenance therapy   Indication: Pneumonia(Community Acquired)   Other Antimicrobials:  none  Vancomycin  dose prior to level drawn: Vancomycin  dosing per level    Vancomycin  trough level: 18.3 mcg/mL which makes this Vancomycin  level therapeutic  AUC/MIC goal: 500-600 mcg*hr/mL  Microbiology data: MRSA PCR +, wound corynebacterium   Assessment & Plan     Planning to adjust regimen to Vancomycin  750 mg IV every 24 hours.    Labs / Follow up     Trough History Lab Results   Component Value Date/Time    VANCOTROUGH 18.3 (H) 08/30/2024 10:09 AM      Serum Creatinine Lab Results   Component Value Date    CREATININE 1.09 (H) 08/30/2024    CREATININE 1.20 (H) 08/29/2024      Creatinine Clearance Estimated Creatinine Clearance: 45 mL/min (A) (based on SCr of 1.09 mg/dL (H)).   BUN Lab Results   Component Value Date/Time    BUN 16 08/30/2024 01:35 AM      WBC Lab Results   Component Value Date/Time    WBC 7.2 08/30/2024 01:35 AM        Next Vancomycin  level: 09/01/2024@1200     [name], PharmD, MPH, BCPS  Clinical Pharmacy Specialist I, Infectious Disease  [phone]    "

## 2024-08-30 NOTE — Progress Notes (Signed)
 "Medicine Progress Note    Date of Admission: 08/27/2024  Length of Stay: 3    Assessment And Plan     Suspect sepsis  Cellulitis right foot  Acute worsening of chronic anemia  AKI  COPD  Chronic hypoxic respiratory failure  Bipolar disorder  PAD    Treatment plan  Patient admitted with suspect sepsis.  Patient was hypotensive admission.  Improved with IV hydration.  Wound culture positive for MRSA.  Continue vancomycin .  Discussed with ID.  Contact isolation.  CBC today WBC 7.2 hemoglobin 8.9 platelets 4 21,000.  CRP 109.  Podiatry following.  BMP sodium 138 potassium 4.1 BUN 16 creatinine 1.09.  Blood pressure is elevated.  I will restart patient on Norvasc  and Coreg .  Hold midodrine .  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  Wound care consulted.  Infectious disease and podiatry consulted.  B12 541, iron saturation 8, ferritin 265, TIBC 133  Check stool for occult blood.  Continue Protonix .      Discontinue IV fluids  Continue oxygen.      Discussed with patient. Updates regarding diagnose, tests results, prognosis, treatment  of the patient's medical conditions discussed in detail     Subjective:     Still complaining of pain in the feet but better.  Blood pressure elevated now.  Feeling weak and tired.  Feeling nauseous.     Objective:     Patient Vitals for the past 12 hrs:   Temp Pulse Resp BP SpO2   08/30/24 1300 -- 68 16 (!) 162/85 96 %   08/30/24 1201 -- 74 25 (!) 180/86 98 %   08/30/24 1130 97.5 F (36.4 C) -- -- -- --   08/30/24 0928 -- -- 19 -- --   08/30/24 0730 98.7 F (37.1 C) -- -- -- --   08/30/24 0500 -- 51 15 (!) 156/69 --   08/30/24 0400 97.5 F (36.4 C) (!) 49 12 137/80 99 %   08/30/24 0300 -- (!) 47 10 (!) 141/73 --         Intake/Output Summary (Last 24 hours) at 08/30/2024 1408  Last data filed at 08/30/2024 1130  Gross per 24 hour   Intake 225 ml   Output 640 ml   Net -415 ml     Constitutional: Awake and alert, NAD  HENT: atraumatic,  normocephalic, oropharynx clear    Eyes:   conjunctiva normal  Neck:   supple and trachea normal  Cardiovascular: S1, S2  Pulmonary/Chest Wall: breath sounds normal and effort normal  Abdominal:      appearance normal, soft, non-tender upon palpation,bowel sounds normal.  Neurological:   awake, alert and oriented, CN intact, generalized weakness..  Extremities: Dressing on both feet.          Recent Results (from the past 48 hours)   Hemoglobin and Hematocrit    Collection Time: 08/28/24  8:45 PM   Result Value Ref Range    Hemoglobin 8.7 (L) 11.0 - 16.0 gm/dl    Hematocrit 73.9 (L) 35.0 - 47.0 %   CBC with Auto Differential    Collection Time: 08/29/24  9:27 AM   Result Value Ref Range    WBC 6.3 4.0 - 11.0 1000/mm3    RBC 3.08 (L) 3.60 - 5.20 M/uL    Hemoglobin 9.1 (L) 11.0 - 16.0 gm/dl    Hematocrit 72.1 (L) 35.0 - 47.0 %    MCV 90.3 80.0 - 98.0 fL  MCH 29.5 25.4 - 34.6 pg    MCHC 32.7 30.0 - 36.0 gm/dl    Platelets 573 859 - 450 1000/mm3    MPV 9.7 6.0 - 10.0 fL    RDW 51.2 (H) 36.4 - 46.3      Nucleated RBCs 0 0 - 0      Immature Granulocytes % 0.3 0.0 - 3.0 %    Neutrophils Segmented 66.2 (H) 34 - 64 %    Lymphocytes 22.0 (L) 28 - 48 %    Monocytes 9.6 1 - 13 %    Eosinophils 1.6 0 - 5 %    Basophils 0.3 0 - 3 %   Basic Metabolic Panel    Collection Time: 08/29/24  9:27 AM   Result Value Ref Range    Potassium 3.9 3.5 - 5.1 mEq/L    Chloride 101 98 - 107 mEq/L    Sodium 133 (L) 136 - 145 mEq/L    CO2 23 20 - 31 mEq/L    Glucose 133 (H) 74 - 106 mg/dl    BUN 14 9 - 23 mg/dl    Creatinine 8.79 (H) 0.55 - 1.02 mg/dl    GFR African American 57.0      GFR Non-African American 47      Calcium  9.2 8.7 - 10.4 mg/dl    Anion Gap 9 5 - 15 mmol/L   Sedimentation Rate    Collection Time: 08/29/24  9:27 AM   Result Value Ref Range    Sed Rate, Automated 82 (H) 0 - 30 mm/Hr   C-Reactive Protein    Collection Time: 08/29/24  9:27 AM   Result Value Ref Range    CRP 109.7 (H) 0.0 - 5.0 mg/L   Vancomycin  Level, Trough     Collection Time: 08/29/24  9:27 AM   Result Value Ref Range    Vancomycin  Tr 19.4 (H) 5.0 - 10.0 mcg/ml   MRSA by PCR    Collection Time: 08/29/24 12:27 PM    Specimen: Nasal   Result Value Ref Range    MRSA PCR screen POSITIVE (A) NEGATIVE     CBC with Auto Differential    Collection Time: 08/30/24  1:35 AM   Result Value Ref Range    WBC 7.2 4.0 - 11.0 1000/mm3    RBC 3.05 (L) 3.60 - 5.20 M/uL    Hemoglobin 8.9 (L) 11.0 - 16.0 gm/dl    Hematocrit 72.5 (L) 35.0 - 47.0 %    MCV 89.8 80.0 - 98.0 fL    MCH 29.2 25.4 - 34.6 pg    MCHC 32.5 30.0 - 36.0 gm/dl    Platelets 572 859 - 450 1000/mm3    MPV 9.2 6.0 - 10.0 fL    RDW 50.5 (H) 36.4 - 46.3      Nucleated RBCs 0 0 - 0      Immature Granulocytes % 0.4 0.0 - 3.0 %    Neutrophils Segmented 62.1 34 - 64 %    Lymphocytes 25.5 (L) 28 - 48 %    Monocytes 9.7 1 - 13 %    Eosinophils 1.9 0 - 5 %    Basophils 0.4 0 - 3 %   Basic Metabolic Panel    Collection Time: 08/30/24  1:35 AM   Result Value Ref Range    Potassium 4.1 3.5 - 5.1 mEq/L    Chloride 103 98 - 107 mEq/L    Sodium 138 136 - 145 mEq/L    CO2  25 20 - 31 mEq/L    Glucose 90 74 - 106 mg/dl    BUN 16 9 - 23 mg/dl    Creatinine 8.90 (H) 0.55 - 1.02 mg/dl    GFR African American >60.0      GFR Non-African American 52      Calcium  9.2 8.7 - 10.4 mg/dl    Anion Gap 10 5 - 15 mmol/L   Vancomycin  Level, Trough    Collection Time: 08/30/24 10:09 AM   Result Value Ref Range    Vancomycin  Tr 18.3 (H) 5.0 - 10.0 mcg/ml         Labs in Last 3 months: No results found for: TSH, VITD25, PSA, INR, GLUF, NTPROBNP, LABA1C, MALBCR       Lipids: No results for input(s): CHOL, HDL in the last 72 hours.    Invalid input(s): LDLCALCU  INR: No results for input(s): INR in the last 72 hours.        Microbiology-  Urine Cx: No results found for: LABURIN  Blood Cx: No results found for: BC  Sputum Cx: No results found for: RESPCULTURE  Gram Stain:   Lab Results   Component Value Date    LABGRAM Rare WBC'S  No  Organisms Seen   08/28/2024     PNA PCR: No results found for: PNPCRPNL  COVID19: No results found for: COVID19  Legionella Ag: No results found for: LEGIONELLAANTIGEN  Strep Ag: No results for input(s): STREPNEUMAGU in the last 72 hours.    Radiology reports as per the Radiologist  Radiology: XR CHEST (2 VW)  Result Date: 08/27/2024  CLINICAL INDICATION: Chest pain Pain. TECHNIQUE:  PA and lateral chest radiographs. FINDINGS:  The heart size is within normal limits. The mediastinal contours are unremarkable. The lungs are clear and the costophrenic angles are sharp. The osseous structures of the thorax are intact. There is a disc space narrowing and bridging osteophytes at several levels in the thoracic spine. There is no significant change compared with the 04/15/2024 chest x-ray.     IMPRESSION:  No radiographic evidence of acute cardiopulmonary disease. Electronically signed by: Lynwood Harden, MD 08/27/2024 12:56 PM EST          Workstation ID: [id number]                  No results found for this or any previous visit.      No results found for this or any previous visit.          Current Facility-Administered Medications   Medication Dose Route Frequency    vancomycin  (VANCOCIN ) 750 mg in sodium chloride  0.9 % 250 mL IVPB  750 mg IntraVENous Q24H    [START ON 09/01/2024] vanc- Draw Vancomycin  Trough at 12/1@1200   1 each Other Once    amLODIPine  (NORVASC ) tablet 5 mg  5 mg Oral Daily    carvedilol  (COREG ) tablet 6.25 mg  6.25 mg Oral BID WC    folic acid  injection 1 mg  1 mg IntraVENous Daily    0.9 % sodium chloride  infusion   IntraVENous PRN    HYDROcodone -acetaminophen  (NORCO ) 5-325 MG per tablet 1 tablet  1 tablet Oral Q6H PRN    aspirin  EC tablet 81 mg  81 mg Oral Daily    atorvastatin  (LIPITOR ) tablet 80 mg  80 mg Oral QHS    clopidogrel  (PLAVIX ) tablet 75 mg  75 mg Oral Daily    ipratropium 0.5 mg-albuterol  2.5 mg (DUONEB ) nebulizer solution 1 Dose  1 Dose Inhalation Q4H PRN    mirabegron   (MYRBETRIQ ) extended release tablet 25 mg  25 mg Oral Daily    pantoprazole  (PROTONIX ) tablet 40 mg  40 mg Oral Daily    tiotropium bromide  (SPIRIVA  RESPIMAT) 2.5 MCG/ACT inhaler 2 puff  2 puff Inhalation Daily RT    [Held by provider] valsartan  (DIOVAN ) tablet 160 mg  160 mg Oral BID    sodium chloride  flush 0.9 % injection 5-40 mL  5-40 mL IntraVENous 2 times per day    sodium chloride  flush 0.9 % injection 5-40 mL  5-40 mL IntraVENous PRN    0.9 % sodium chloride  infusion   IntraVENous PRN    enoxaparin  Sodium (LOVENOX ) injection 30 mg  30 mg SubCUTAneous Daily    ondansetron  (ZOFRAN -ODT) disintegrating tablet 4 mg  4 mg Oral Q8H PRN    Or    ondansetron  (ZOFRAN ) injection 4 mg  4 mg IntraVENous Q6H PRN    polyethylene glycol (GLYCOLAX ) packet 17 g  17 g Oral Daily PRN    acetaminophen  (TYLENOL ) tablet 650 mg  650 mg Oral Q6H PRN    Or    acetaminophen  (TYLENOL ) suppository 650 mg  650 mg Rectal Q6H PRN    [Held by provider] midodrine  (PROAMATINE ) tablet 10 mg  10 mg Oral TID WC    fluticasone  furoate-vilanterol (BREO ELLIPTA ) 100-25 MCG/ACT inhaler 1 puff  1 puff Inhalation Daily RT    *Vancomycin  dosing per pharmacy (DPL)  1 each Other RX Placeholder            Dragon medical dictation software was used for portions of this report. Unintended errors may occur.   BELYNDA HOOF, MD  08/30/2024             "

## 2024-08-31 LAB — CBC WITH AUTO DIFFERENTIAL
Basophils: 0.5 % (ref 0–3)
Eosinophils: 0.6 % (ref 0–5)
Hematocrit: 29.7 % — ABNORMAL LOW (ref 35.0–47.0)
Hemoglobin: 9.8 g/dL — ABNORMAL LOW (ref 11.0–16.0)
Immature Granulocytes %: 0.3 % (ref 0.0–3.0)
Lymphocytes: 16 % — ABNORMAL LOW (ref 28–48)
MCH: 29.5 pg (ref 25.4–34.6)
MCHC: 33 g/dL (ref 30.0–36.0)
MCV: 89.5 fL (ref 80.0–98.0)
MPV: 9.1 fL (ref 6.0–10.0)
Monocytes: 7.4 % (ref 1–13)
Neutrophils Segmented: 75.2 % — ABNORMAL HIGH (ref 34–64)
Nucleated RBCs: 0 (ref 0–0)
Platelets: 444 1000/mm3 (ref 140–450)
RBC: 3.32 M/uL — ABNORMAL LOW (ref 3.60–5.20)
RDW: 50.1 — ABNORMAL HIGH (ref 36.4–46.3)
WBC: 6.6 1000/mm3 (ref 4.0–11.0)

## 2024-08-31 LAB — BASIC METABOLIC PANEL
Anion Gap: 8 mmol/L (ref 5–15)
BUN: 10 mg/dL (ref 9–23)
CO2: 26 meq/L (ref 20–31)
Calcium: 9.2 mg/dL (ref 8.7–10.4)
Chloride: 99 meq/L (ref 98–107)
Creatinine: 0.83 mg/dL (ref 0.55–1.02)
GFR African American: >60
GFR Non-African American: >60
Glucose: 86 mg/dL (ref 74–106)
Potassium: 3.5 meq/L (ref 3.5–5.1)
Sodium: 133 meq/L — ABNORMAL LOW (ref 136–145)

## 2024-08-31 MED ORDER — MELATONIN 3 MG PO TABS
3 | Freq: Once | ORAL | Status: AC
Start: 2024-08-31 — End: 2024-08-31
  Administered 2024-09-01: 04:00:00 6 mg via ORAL

## 2024-08-31 MED ORDER — VANCOMYCIN 750 MG IN NS 250 ML (PREMIX) IVPB
Status: DC
Start: 2024-08-31 — End: 2024-09-01
  Administered 2024-08-31: 18:00:00 750 mg via INTRAVENOUS

## 2024-08-31 MED ORDER — POTASSIUM CHLORIDE CRYS ER 20 MEQ PO TBCR
20 | Freq: Every day | ORAL | Status: DC
Start: 2024-08-31 — End: 2024-09-02
  Administered 2024-08-31 – 2024-09-02 (×3): 20 meq via ORAL

## 2024-08-31 MED FILL — ASPIRIN LOW DOSE 81 MG PO TBEC: 81 mg | ORAL | Qty: 1 | Fill #0

## 2024-08-31 MED FILL — VANCOMYCIN 750 MG IN NS 250 ML (PREMIX) IVPB: Qty: 250 | Fill #0

## 2024-08-31 MED FILL — HYDROCODONE-ACETAMINOPHEN 5-325 MG PO TABS: 5-325 mg | ORAL | Qty: 1 | Fill #0

## 2024-08-31 MED FILL — CLOPIDOGREL BISULFATE 75 MG PO TABS: 75 mg | ORAL | Qty: 1 | Fill #0

## 2024-08-31 MED FILL — MIRABEGRON ER 25 MG PO TB24: 25 mg | ORAL | Qty: 1 | Fill #0

## 2024-08-31 MED FILL — MELATONIN 3 MG PO TABS: 3 mg | ORAL | Qty: 2 | Fill #0

## 2024-08-31 MED FILL — VANCOMYCIN HCL 1 G IV SOLR: 1 g | INTRAVENOUS | Qty: 750 | Fill #0

## 2024-08-31 MED FILL — ONDANSETRON HCL 4 MG/2ML IJ SOLN: 4 MG/2ML | INTRAMUSCULAR | Qty: 2 | Fill #0

## 2024-08-31 MED FILL — PANTOPRAZOLE SODIUM 40 MG PO TBEC: 40 mg | ORAL | Qty: 1 | Fill #0

## 2024-08-31 MED FILL — FOLIC ACID 5 MG/ML IJ SOLN: 5 mg/mL | INTRAMUSCULAR | Qty: 0.2 | Fill #0

## 2024-08-31 MED FILL — AMLODIPINE BESYLATE 5 MG PO TABS: 5 mg | ORAL | Qty: 1 | Fill #0

## 2024-08-31 MED FILL — SODIUM CHLORIDE FLUSH 0.9 % IV SOLN: 0.9 % | INTRAVENOUS | Qty: 10 | Fill #0

## 2024-08-31 MED FILL — CARVEDILOL 6.25 MG PO TABS: 6.25 mg | ORAL | Qty: 1 | Fill #0

## 2024-08-31 MED FILL — ATORVASTATIN CALCIUM 40 MG PO TABS: 40 mg | ORAL | Qty: 2 | Fill #0

## 2024-08-31 MED FILL — ACETAMINOPHEN 325 MG PO TABS: 325 mg | ORAL | Qty: 2 | Fill #0

## 2024-08-31 MED FILL — LOVENOX 30 MG/0.3ML IJ SOSY: 30 MG/0.3ML | INTRAMUSCULAR | Qty: 0.3 | Fill #0

## 2024-08-31 MED FILL — KLOR-CON M20 20 MEQ PO TBCR: 20 meq | ORAL | Qty: 1 | Fill #0

## 2024-08-31 NOTE — Progress Notes (Signed)
 "Medicine Progress Note    Date of Admission: 08/27/2024  Length of Stay: 4    Assessment And Plan     Sepsis, POA  Cellulitis right foot due to MRSA  Acute worsening of chronic anemia  AKI  COPD  Chronic hypoxic respiratory failure  Bipolar disorder  PAD    Treatment plan  Patient admitted with suspect sepsis.  Patient was hypotensive admission.  Improved with IV hydration.  Blood pressure normalized.  Patient restarted on Norvasc .  Wound culture positive for MRSA.  Continue vancomycin .  Discussed with ID.  Continue IV vancomycin  while in the hospital and switch to oral Zyvox  on discharge.  Contact isolation.  CRP 109.  Podiatry following.  Patient received 1 unit packed RBC.  Hemoglobin improving with IV iron.  CBC today WBC 6.6 hemoglobin 9.8 platelets 4 44,000.  AKI resolved.  BMP sodium 133 potassium 3.5 BUN 10 creatinine 0.83.  Blood pressure is better controlled with Norvasc  and Coreg  now..    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  Wound care consulted.  B12 541, iron saturation 8, ferritin 265, TIBC 133  Check stool for occult blood.  Continue Protonix .      Discontinue IV fluids  Continue oxygen.  Spoke at length with patient daughter and updated.  Skilled nursing was discussed with patient and daughter would prefer to go home.      Discussed with patient. Updates regarding diagnose, tests results, prognosis, treatment  of the patient's medical conditions discussed in detail     Subjective:     Complaining of foot pain more on the left foot.  BP is better.  Still feeling weak.     Objective:     Patient Vitals for the past 12 hrs:   Temp Pulse Resp BP SpO2   08/31/24 1007 -- -- 18 -- --   08/31/24 0813 98.1 F (36.7 C) 90 18 122/66 98 %   08/31/24 0527 97.9 F (36.6 C) 86 17 122/67 99 %       No intake or output data in the 24 hours ending 08/31/24 1419    Constitutional: Awake and alert, NAD  Cardiovascular: S1, S2 no added sounds  Pulmonary/Chest  Wall: breath sounds normal and effort normal  Abdominal:      appearance normal, soft, non-tender upon palpation,bowel sounds normal.  Neurological:   awake, alert and oriented, CN intact, generalized weakness..  Extremities: Dressing on both feet.  Left foot is slightly swollen.          Recent Results (from the past 48 hours)   CBC with Auto Differential    Collection Time: 08/30/24  1:35 AM   Result Value Ref Range    WBC 7.2 4.0 - 11.0 1000/mm3    RBC 3.05 (L) 3.60 - 5.20 M/uL    Hemoglobin 8.9 (L) 11.0 - 16.0 gm/dl    Hematocrit 72.5 (L) 35.0 - 47.0 %    MCV 89.8 80.0 - 98.0 fL    MCH 29.2 25.4 - 34.6 pg    MCHC 32.5 30.0 - 36.0 gm/dl    Platelets 572 859 - 450 1000/mm3    MPV 9.2 6.0 - 10.0 fL    RDW 50.5 (H) 36.4 - 46.3      Nucleated RBCs 0 0 - 0      Immature Granulocytes % 0.4 0.0 - 3.0 %    Neutrophils Segmented 62.1 34 - 64 %    Lymphocytes 25.5 (  L) 28 - 48 %    Monocytes 9.7 1 - 13 %    Eosinophils 1.9 0 - 5 %    Basophils 0.4 0 - 3 %   Basic Metabolic Panel    Collection Time: 08/30/24  1:35 AM   Result Value Ref Range    Potassium 4.1 3.5 - 5.1 mEq/L    Chloride 103 98 - 107 mEq/L    Sodium 138 136 - 145 mEq/L    CO2 25 20 - 31 mEq/L    Glucose 90 74 - 106 mg/dl    BUN 16 9 - 23 mg/dl    Creatinine 8.90 (H) 0.55 - 1.02 mg/dl    GFR African American >60.0      GFR Non-African American 52      Calcium  9.2 8.7 - 10.4 mg/dl    Anion Gap 10 5 - 15 mmol/L   Vancomycin  Level, Trough    Collection Time: 08/30/24 10:09 AM   Result Value Ref Range    Vancomycin  Tr 18.3 (H) 5.0 - 10.0 mcg/ml   CBC with Auto Differential    Collection Time: 08/31/24  7:45 AM   Result Value Ref Range    WBC 6.6 4.0 - 11.0 1000/mm3    RBC 3.32 (L) 3.60 - 5.20 M/uL    Hemoglobin 9.8 (L) 11.0 - 16.0 gm/dl    Hematocrit 70.2 (L) 35.0 - 47.0 %    MCV 89.5 80.0 - 98.0 fL    MCH 29.5 25.4 - 34.6 pg    MCHC 33.0 30.0 - 36.0 gm/dl    Platelets 555 859 - 450 1000/mm3    MPV 9.1 6.0 - 10.0 fL    RDW 50.1 (H) 36.4 - 46.3      Nucleated RBCs 0  0 - 0      Immature Granulocytes % 0.3 0.0 - 3.0 %    Neutrophils Segmented 75.2 (H) 34 - 64 %    Lymphocytes 16.0 (L) 28 - 48 %    Monocytes 7.4 1 - 13 %    Eosinophils 0.6 0 - 5 %    Basophils 0.5 0 - 3 %   Basic Metabolic Panel    Collection Time: 08/31/24  7:45 AM   Result Value Ref Range    Potassium 3.5 3.5 - 5.1 mEq/L    Chloride 99 98 - 107 mEq/L    Sodium 133 (L) 136 - 145 mEq/L    CO2 26 20 - 31 mEq/L    Glucose 86 74 - 106 mg/dl    BUN 10 9 - 23 mg/dl    Creatinine 9.16 9.44 - 1.02 mg/dl    GFR African American >60.0      GFR Non-African American >60      Calcium  9.2 8.7 - 10.4 mg/dl    Anion Gap 8 5 - 15 mmol/L         Labs in Last 3 months: No results found for: TSH, VITD25, PSA, INR, GLUF, NTPROBNP, LABA1C, MALBCR       Lipids: No results for input(s): CHOL, HDL in the last 72 hours.    Invalid input(s): LDLCALCU  INR: No results for input(s): INR in the last 72 hours.        Microbiology-  Urine Cx: No results found for: LABURIN  Blood Cx: No results found for: BC  Sputum Cx: No results found for: RESPCULTURE  Gram Stain:   Lab Results   Component Value Date    LABGRAM  Rare WBC'S  No Organisms Seen   08/28/2024     PNA PCR: No results found for: PNPCRPNL  COVID19: No results found for: COVID19  Legionella Ag: No results found for: LEGIONELLAANTIGEN  Strep Ag: No results for input(s): STREPNEUMAGU in the last 72 hours.    Radiology reports as per the Radiologist  Radiology: XR CHEST (2 VW)  Result Date: 08/27/2024  CLINICAL INDICATION: Chest pain Pain. TECHNIQUE:  PA and lateral chest radiographs. FINDINGS:  The heart size is within normal limits. The mediastinal contours are unremarkable. The lungs are clear and the costophrenic angles are sharp. The osseous structures of the thorax are intact. There is a disc space narrowing and bridging osteophytes at several levels in the thoracic spine. There is no significant change compared with the 04/15/2024 chest x-ray.      IMPRESSION:  No radiographic evidence of acute cardiopulmonary disease. Electronically signed by: Lynwood Harden, MD 08/27/2024 12:56 PM EST          Workstation ID: [id number]                  No results found for this or any previous visit.      No results found for this or any previous visit.          Current Facility-Administered Medications   Medication Dose Route Frequency    vancomycin  (VANCOCIN ) 750 mg in sodium chloride  0.9 % 250 mL IVPB  750 mg IntraVENous Q24H    [START ON 09/01/2024] vanc- Draw Vancomycin  Trough at 12/1@1200   1 each Other Once    amLODIPine  (NORVASC ) tablet 5 mg  5 mg Oral Daily    carvedilol  (COREG ) tablet 6.25 mg  6.25 mg Oral BID WC    melatonin tablet 6 mg  6 mg Oral Once    folic acid  injection 1 mg  1 mg IntraVENous Daily    0.9 % sodium chloride  infusion   IntraVENous PRN    HYDROcodone -acetaminophen  (NORCO ) 5-325 MG per tablet 1 tablet  1 tablet Oral Q6H PRN    aspirin  EC tablet 81 mg  81 mg Oral Daily    atorvastatin  (LIPITOR ) tablet 80 mg  80 mg Oral QHS    clopidogrel  (PLAVIX ) tablet 75 mg  75 mg Oral Daily    ipratropium 0.5 mg-albuterol  2.5 mg (DUONEB ) nebulizer solution 1 Dose  1 Dose Inhalation Q4H PRN    mirabegron  (MYRBETRIQ ) extended release tablet 25 mg  25 mg Oral Daily    pantoprazole  (PROTONIX ) tablet 40 mg  40 mg Oral Daily    tiotropium bromide  (SPIRIVA  RESPIMAT) 2.5 MCG/ACT inhaler 2 puff  2 puff Inhalation Daily RT    [Held by provider] valsartan  (DIOVAN ) tablet 160 mg  160 mg Oral BID    sodium chloride  flush 0.9 % injection 5-40 mL  5-40 mL IntraVENous 2 times per day    sodium chloride  flush 0.9 % injection 5-40 mL  5-40 mL IntraVENous PRN    0.9 % sodium chloride  infusion   IntraVENous PRN    enoxaparin  Sodium (LOVENOX ) injection 30 mg  30 mg SubCUTAneous Daily    ondansetron  (ZOFRAN -ODT) disintegrating tablet 4 mg  4 mg Oral Q8H PRN    Or    ondansetron  (ZOFRAN ) injection 4 mg  4 mg IntraVENous Q6H PRN    polyethylene glycol (GLYCOLAX ) packet 17 g  17  g Oral Daily PRN    acetaminophen  (TYLENOL ) tablet 650 mg  650 mg Oral Q6H PRN    Or  acetaminophen  (TYLENOL ) suppository 650 mg  650 mg Rectal Q6H PRN    [Held by provider] midodrine  (PROAMATINE ) tablet 10 mg  10 mg Oral TID WC    fluticasone  furoate-vilanterol (BREO ELLIPTA ) 100-25 MCG/ACT inhaler 1 puff  1 puff Inhalation Daily RT    *Vancomycin  dosing per pharmacy ([facility])  1 each Other RX Placeholder            Dragon medical dictation software was used for portions of this report. Unintended errors may occur.   BELYNDA HOOF, MD  08/31/2024             "

## 2024-08-31 NOTE — Progress Notes (Signed)
 "                                                                                                                                                  INFECTIOUS DISEASE FOLLOW UP NOTE       Requested by: Dr. Curt     Reason for consult:Foot ulcer     Date of admission: 08/27/2024     Date of consult: August 28, 2024       ANTIBIOTICS:       Current abx Prior abx    Vancomycin  11/26-4    Cefepime  11/26-2        ASSESSMENT :        Hypotension  -Suspect more from decreased PO intake, anemia, BP meds instead of overt infection   Chronic LE wounds  -Wound cultures with MRSA and Corynebacterium.   -Wound appears to be healing with wound care.   -Not deep and not probing to bone   Hx of R foot abscess/ostemyelitis with negative cultures July 2025  -Previously tx with Cefepime  x 6 weeks and Flagyl  x 4 weeks   AKI           RECOMMENDATIONS :     73 year old female with history of PAD, right foot osteomyelitis presented to the hospital 08/27/2024 due to hypotension.  Her blood pressures were being addressed as an outpatient.  Endorses decreased p.o. intake.  ID consulted due to chronic foot wound.  Overall the foot wound appears to be healing, but did have a small amount of drainage and warmth on palpation 11/27 so obtained a culture which is growing MRSA and Corynebacterium species.  Less likely that infection is causing her hypotension given that she is afebrile with normal WBC, normal procalcitonin. Podiatry following.      - Can continue Vancomycin  in the hospital, on discharge switch to PO Linezolid  600mg  BID to complete a week of therapy for superimposed skin/soft tissue infection. Continue follow up with podiatry and wound care. Expect that her wound healing will be poor due to arterial insufficiency.     -ID will sign off. Please call if needed.              ANN OLDEN, DO  August 31, 2024    [facility]   Office Phone:[phone]  Fax:[phone]      MICROBIOLOGY:   Microbiology  Results  08/27/24 BCX NGTD  08/28/24 Wound culture pending    LINES/CATHETERS:   PIV    SUMMARY:   73 year old female with history of hypertension, COPD on 2 L of home oxygen, right foot abscess/osteomyelitis presented to the hospital 08/27/2024 due to hypotension.  States her blood pressure has been getting lower for the past few weeks.  Initially was on 3 blood pressure medications and was taken off of 1 a week prior to arrival.  Denies any other symptoms.  Has chronic leg pain.  Per daughter at bedside the wounds in her bilateral feet are being addressed by wound care and appear to be healing well.     Was hospitalized in July/August 2025 due to a right foot abscess.  Had I&D done and cultures were unrevealing.  Pathology was consistent with acute on chronic osteomyelitis.  She was ultimately treated empirically with 6 weeks of cefepime  and 4 weeks of metronidazole .  Has been following up with wound care since and been doing well.     Currently on vancomycin  and cefepime  which was started on admission.  No fevers or leukocytosis.    SUBJECTIVE :     Interval notes reviewed.    Endorses continued leg pain  Wound cultures with MRSA and corynebacterium species.     OBJECTIVE     BP 122/66   Pulse 90   Temp 98.1 F (36.7 C) (Axillary)   Resp 18   Ht 1.702 m (5' 7)   Wt 67.5 kg (148 lb 12.8 oz)   SpO2 98%   BMI 23.31 kg/m     Temp (24hrs), Avg:98.1 F (36.7 C), Min:97 F (36.1 C), Max:99.1 F (37.3 C)      General: Well-developed, 72 y.o. year-old, female, appears uncomfortable from LE pain  HEENT: Normocephalic, anicteric sclerae, Pupils equal, round reactive to light, no oropharyngeal lesions.    Neck: Supple,    Chest: Symmetrical expansion  Lungs: Clear to auscultation bilaterally, no dullness  Heart: Regular rhythm,   Abdomen: Soft, non-tender,non distended, no organomegaly, BS+  Musculoskeletal: No edema. No clubbing or cyanosis  SKIN: Chronic lower extremity foot wounds.  See photos.  No drainage  today. Some surrounding warmth and tenderness to palpation around wound on R heel.       MEDICATIONS:     Current Facility-Administered Medications   Medication Dose Route Frequency Provider Last Rate Last Admin    vancomycin  (VANCOCIN ) 750 mg in sodium chloride  0.9 % 250 mL IVPB  750 mg IntraVENous Q24H Mehmood, Khalid, MD        NOREEN ON 09/01/2024] vanc- Draw Vancomycin  Trough at 12/1@1200   1 each Other Once Lonnie Passy, MD        amLODIPine  (NORVASC ) tablet 5 mg  5 mg Oral Daily Mehmood, Khalid, MD   5 mg at 08/31/24 9075    carvedilol  (COREG ) tablet 6.25 mg  6.25 mg Oral BID WC Mehmood, Khalid, MD   6.25 mg at 08/31/24 9075    melatonin tablet 6 mg  6 mg Oral Once Matriano, John Paul H, MD        folic acid  injection 1 mg  1 mg IntraVENous Daily Mehmood, Khalid, MD   1 mg at 08/31/24 0926    0.9 % sodium chloride  infusion   IntraVENous PRN Mehmood, Khalid, MD        HYDROcodone -acetaminophen  (NORCO ) 5-325 MG per tablet 1 tablet  1 tablet Oral Q6H PRN Mehmood, Belynda, MD   1 tablet at 08/31/24 9062    aspirin  EC tablet 81 mg  81 mg Oral Daily Verma, Sourabh, MD   81 mg at 08/31/24 9075    atorvastatin  (LIPITOR ) tablet 80 mg  80 mg Oral QHS Verma, Sourabh, MD   80 mg at 08/30/24 2132    clopidogrel  (PLAVIX ) tablet 75 mg  75 mg Oral Daily Verma, Sourabh, MD   75 mg at 08/31/24 0924    ipratropium 0.5 mg-albuterol  2.5 mg (DUONEB ) nebulizer solution 1  Dose  1 Dose Inhalation Q4H PRN Lonnie Passy, MD        mirabegron  (MYRBETRIQ ) extended release tablet 25 mg  25 mg Oral Daily Lonnie Passy, MD   25 mg at 08/31/24 9075    pantoprazole  (PROTONIX ) tablet 40 mg  40 mg Oral Daily Lonnie Passy, MD   40 mg at 08/31/24 9074    tiotropium bromide  (SPIRIVA  RESPIMAT) 2.5 MCG/ACT inhaler 2 puff  2 puff Inhalation Daily RT Lonnie Passy, MD   2 puff at 08/31/24 0931    [Held by provider] valsartan  (DIOVAN ) tablet 160 mg  160 mg Oral BID Lonnie Passy, MD        sodium chloride  flush 0.9 % injection 5-40 mL  5-40 mL  IntraVENous 2 times per day Lonnie Passy, MD   10 mL at 08/31/24 0926    sodium chloride  flush 0.9 % injection 5-40 mL  5-40 mL IntraVENous PRN Lonnie Passy, MD        0.9 % sodium chloride  infusion   IntraVENous PRN Lonnie Passy, MD        enoxaparin  Sodium (LOVENOX ) injection 30 mg  30 mg SubCUTAneous Daily Lonnie Passy, MD   30 mg at 08/31/24 9071    ondansetron  (ZOFRAN -ODT) disintegrating tablet 4 mg  4 mg Oral Q8H PRN Lonnie Passy, MD        Or    ondansetron  (ZOFRAN ) injection 4 mg  4 mg IntraVENous Q6H PRN Lonnie Passy, MD   4 mg at 08/30/24 2155    polyethylene glycol (GLYCOLAX ) packet 17 g  17 g Oral Daily PRN Lonnie Passy, MD   17 g at 08/30/24 1201    acetaminophen  (TYLENOL ) tablet 650 mg  650 mg Oral Q6H PRN Lonnie Passy, MD   650 mg at 08/29/24 9071    Or    acetaminophen  (TYLENOL ) suppository 650 mg  650 mg Rectal Q6H PRN Lonnie Passy, MD        [facility] by provider] midodrine  (PROAMATINE ) tablet 10 mg  10 mg Oral TID WC Lonnie Passy, MD   10 mg at 08/30/24 0913    fluticasone  furoate-vilanterol (BREO ELLIPTA ) 100-25 MCG/ACT inhaler 1 puff  1 puff Inhalation Daily RT Lonnie Passy, MD   1 puff at 08/31/24 0930    *Vancomycin  dosing per pharmacy ([facility])  1 each Other RX Placeholder Lonnie Passy, MD             Labs: Results:   Chemistry Recent Labs     08/29/24  0927 08/30/24  0135 08/31/24  0745   NA 133* 138 133*   K 3.9 4.1 3.5   CL 101 103 99   CO2 23 25 26    BUN 14 16 10       CBC w/Diff Recent Labs     08/29/24  0927 08/30/24  0135 08/31/24  0745   WBC 6.3 7.2 6.6   RBC 3.08* 3.05* 3.32*   HGB 9.1* 8.9* 9.8*   HCT 27.8* 27.4* 29.7*   PLT 426 427 444                 RADIOLOGY :    ABI  Moderate arterial insufficiency.     CXR 08/27/24  IMPRESSION:      No radiographic evidence of acute cardiopulmonary disease.     I have independently reviewed lab studies and imaging as well as review of nursing notes and physican notes from the past 24 hours.    Chief executive officer  was used for portions of this report. Unintended errors may occur.                "

## 2024-09-01 LAB — VANCOMYCIN LEVEL, TROUGH: Vancomycin Tr: 12.2 ug/mL — ABNORMAL HIGH (ref 5.0–10.0)

## 2024-09-01 MED ORDER — VANCOMYCIN INTERMITTENT DOSING (PLACEHOLDER)
Freq: Once | INTRAVENOUS | Status: DC
Start: 2024-09-01 — End: 2024-09-02

## 2024-09-01 MED ORDER — VANCOMYCIN (VANCOCIN) 1000 MG IN SODIUM CHLORIDE 0.9% 250 ML IVPB
Status: DC
Start: 2024-09-01 — End: 2024-09-02
  Administered 2024-09-01: 23:00:00 1000 mg via INTRAVENOUS

## 2024-09-01 MED ORDER — CARVEDILOL 6.25 MG PO TABS
6.25 | Freq: Two times a day (BID) | ORAL | Status: DC
Start: 2024-09-01 — End: 2024-09-02
  Administered 2024-09-01 – 2024-09-02 (×2): 3.125 mg via ORAL

## 2024-09-01 MED FILL — ASPIRIN LOW DOSE 81 MG PO TBEC: 81 mg | ORAL | Qty: 1 | Fill #0

## 2024-09-01 MED FILL — CLOPIDOGREL BISULFATE 75 MG PO TABS: 75 mg | ORAL | Qty: 1 | Fill #0

## 2024-09-01 MED FILL — MELATONIN 3 MG PO TABS: 3 mg | ORAL | Qty: 2 | Fill #0

## 2024-09-01 MED FILL — KLOR-CON M20 20 MEQ PO TBCR: 20 meq | ORAL | Qty: 1 | Fill #0

## 2024-09-01 MED FILL — CARVEDILOL 6.25 MG PO TABS: 6.25 mg | ORAL | Qty: 1 | Fill #0

## 2024-09-01 MED FILL — VANCOMYCIN 750 MG IN NS 250 ML (PREMIX) IVPB: Qty: 250 | Fill #0

## 2024-09-01 MED FILL — FOLIC ACID 5 MG/ML IJ SOLN: 5 mg/mL | INTRAMUSCULAR | Qty: 0.2 | Fill #0

## 2024-09-01 MED FILL — ATORVASTATIN CALCIUM 40 MG PO TABS: 40 mg | ORAL | Qty: 2 | Fill #0

## 2024-09-01 MED FILL — SODIUM CHLORIDE FLUSH 0.9 % IV SOLN: 0.9 % | INTRAVENOUS | Qty: 10 | Fill #0

## 2024-09-01 MED FILL — HYDROCODONE-ACETAMINOPHEN 5-325 MG PO TABS: 5-325 mg | ORAL | Qty: 1 | Fill #0

## 2024-09-01 MED FILL — MIRABEGRON ER 25 MG PO TB24: 25 mg | ORAL | Qty: 1 | Fill #0

## 2024-09-01 MED FILL — LOVENOX 30 MG/0.3ML IJ SOSY: 30 MG/0.3ML | INTRAMUSCULAR | Qty: 0.3 | Fill #0

## 2024-09-01 MED FILL — AMLODIPINE BESYLATE 5 MG PO TABS: 5 mg | ORAL | Qty: 1 | Fill #0

## 2024-09-01 MED FILL — ONDANSETRON HCL 4 MG/2ML IJ SOLN: 4 MG/2ML | INTRAMUSCULAR | Qty: 2 | Fill #0

## 2024-09-01 MED FILL — PANTOPRAZOLE SODIUM 40 MG PO TBEC: 40 mg | ORAL | Qty: 1 | Fill #0

## 2024-09-01 MED FILL — VANCOMYCIN (VANCOCIN) 1000 MG IN SODIUM CHLORIDE 0.9% 250 ML IVPB: Qty: 250 | Fill #0

## 2024-09-01 NOTE — Progress Notes (Signed)
 "Medicine Progress Note    Date of Admission: 08/27/2024  Length of Stay: 5    Assessment And Plan     Sepsis, POA  Cellulitis right foot due to MRSA  Acute worsening of chronic anemia  AKI  COPD  Chronic hypoxic respiratory failure  Bipolar disorder  PAD    Treatment plan  Patient admitted with  sepsis.  Patient was hypotensive admission.  Improved with IV hydration.  Blood pressure remains soft.  Hold Norvasc .  Low-dose Coreg .  Continue to hold Diovan .  Wound culture positive for MRSA.  Continue vancomycin .  Discussed with ID.  Continue IV vancomycin  while in the hospital and switch to oral Zyvox  on discharge.  I discussed with podiatrist at bedside.  Contact isolation.  CRP 109.  Podiatry following.  Patient received 1 unit packed RBC.  Hemoglobin improving with IV iron.  CBC today WBC 6.6 hemoglobin 9.8 platelets 4 44,000.  AKI resolved.  BMP sodium 133 potassium 3.5 BUN 10 creatinine 0.83.  Blood pressure is better controlled with Norvasc  and Coreg  now..    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  Wound care consulted.  B12 541, iron saturation 8, ferritin 265, TIBC 133  Check stool for occult blood.  Continue Protonix .      Discontinue IV fluids  Continue oxygen.  Spoke at length with patient daughter and updated.   Family would like patient to go home with home health on discharge.      Discussed with patient. Updates regarding diagnose, tests results, prognosis, treatment  of the patient's medical conditions discussed in detail     Subjective:     Patient seen with podiatrist at bedside.  Left foot wound is much improved.  Still complaining of some pain in feet.  Feeling weak and tired.  Hoping to go home soon.     Objective:     Patient Vitals for the past 12 hrs:   Temp Pulse Resp BP SpO2   09/01/24 1855 -- -- 18 -- --   09/01/24 1615 99 F (37.2 C) 74 18 116/64 100 %   09/01/24 1207 98.1 F (36.7 C) 70 18 114/66 98 %   09/01/24 0959 -- -- 18 -- --    09/01/24 0858 97.7 F (36.5 C) 82 18 112/76 100 %         Intake/Output Summary (Last 24 hours) at 09/01/2024 2024  Last data filed at 09/01/2024 0211  Gross per 24 hour   Intake 240 ml   Output 300 ml   Net -60 ml       Constitutional: Awake and alert, NAD  Cardiovascular: S1, S2 no murmurs.  Pulmonary/Chest Wall: Lungs clear  Abdominal:      appearance normal, soft, non-tender upon palpation,bowel sounds normal.  Neurological:   awake, alert and oriented, CN intact, generalized weakness..  Extremities: Dressing on both feet.  Left foot is slightly swollen.          Recent Results (from the past 48 hours)   CBC with Auto Differential    Collection Time: 08/31/24  7:45 AM   Result Value Ref Range    WBC 6.6 4.0 - 11.0 1000/mm3    RBC 3.32 (L) 3.60 - 5.20 M/uL    Hemoglobin 9.8 (L) 11.0 - 16.0 gm/dl    Hematocrit 70.2 (L) 35.0 - 47.0 %    MCV 89.5 80.0 - 98.0 fL    MCH 29.5 25.4 - 34.6 pg  MCHC 33.0 30.0 - 36.0 gm/dl    Platelets 555 859 - 450 1000/mm3    MPV 9.1 6.0 - 10.0 fL    RDW 50.1 (H) 36.4 - 46.3      Nucleated RBCs 0 0 - 0      Immature Granulocytes % 0.3 0.0 - 3.0 %    Neutrophils Segmented 75.2 (H) 34 - 64 %    Lymphocytes 16.0 (L) 28 - 48 %    Monocytes 7.4 1 - 13 %    Eosinophils 0.6 0 - 5 %    Basophils 0.5 0 - 3 %   Basic Metabolic Panel    Collection Time: 08/31/24  7:45 AM   Result Value Ref Range    Potassium 3.5 3.5 - 5.1 mEq/L    Chloride 99 98 - 107 mEq/L    Sodium 133 (L) 136 - 145 mEq/L    CO2 26 20 - 31 mEq/L    Glucose 86 74 - 106 mg/dl    BUN 10 9 - 23 mg/dl    Creatinine 9.16 9.44 - 1.02 mg/dl    GFR African American >60.0      GFR Non-African American >60      Calcium  9.2 8.7 - 10.4 mg/dl    Anion Gap 8 5 - 15 mmol/L   Vancomycin  Level, Trough    Collection Time: 09/01/24 12:00 PM   Result Value Ref Range    Vancomycin  Tr 12.2 (H) 5.0 - 10.0 mcg/ml         Labs in Last 3 months: No results found for: TSH, VITD25, PSA, INR, GLUF, NTPROBNP, LABA1C, MALBCR       Lipids: No  results for input(s): CHOL, HDL in the last 72 hours.    Invalid input(s): LDLCALCU  INR: No results for input(s): INR in the last 72 hours.        Microbiology-  Urine Cx: No results found for: LABURIN  Blood Cx: No results found for: BC  Sputum Cx: No results found for: RESPCULTURE  Gram Stain:   Lab Results   Component Value Date    LABGRAM Rare WBC'S  No Organisms Seen   08/28/2024     PNA PCR: No results found for: PNPCRPNL  COVID19: No results found for: COVID19  Legionella Ag: No results found for: LEGIONELLAANTIGEN  Strep Ag: No results for input(s): STREPNEUMAGU in the last 72 hours.    Radiology reports as per the Radiologist  Radiology: XR CHEST (2 VW)  Result Date: 08/27/2024  CLINICAL INDICATION: Chest pain Pain. TECHNIQUE:  PA and lateral chest radiographs. FINDINGS:  The heart size is within normal limits. The mediastinal contours are unremarkable. The lungs are clear and the costophrenic angles are sharp. The osseous structures of the thorax are intact. There is a disc space narrowing and bridging osteophytes at several levels in the thoracic spine. There is no significant change compared with the 04/15/2024 chest x-ray.     IMPRESSION:  No radiographic evidence of acute cardiopulmonary disease. Electronically signed by: Lynwood Harden, MD 08/27/2024 12:56 PM EST          Workstation ID: [id number]                  No results found for this or any previous visit.      No results found for this or any previous visit.          Current Facility-Administered Medications   Medication Dose Route Frequency  carvedilol  (COREG ) tablet 3.125 mg  3.125 mg Oral BID WC    vancomycin  (VANCOCIN ) 1000 mg in sodium chloride  0.9% 250 mL IVPB  1,000 mg IntraVENous Q24H    [START ON 09/03/2024] Vancomycin  Trough Due  1 each Other Once    potassium chloride  (KLOR-CON  M) extended release tablet 20 mEq  20 mEq Oral Daily with breakfast    [Held by provider] amLODIPine  (NORVASC ) tablet 5 mg  5 mg  Oral Daily    folic acid  injection 1 mg  1 mg IntraVENous Daily    0.9 % sodium chloride  infusion   IntraVENous PRN    HYDROcodone -acetaminophen  (NORCO ) 5-325 MG per tablet 1 tablet  1 tablet Oral Q6H PRN    aspirin  EC tablet 81 mg  81 mg Oral Daily    atorvastatin  (LIPITOR ) tablet 80 mg  80 mg Oral QHS    clopidogrel  (PLAVIX ) tablet 75 mg  75 mg Oral Daily    ipratropium 0.5 mg-albuterol  2.5 mg (DUONEB ) nebulizer solution 1 Dose  1 Dose Inhalation Q4H PRN    mirabegron  (MYRBETRIQ ) extended release tablet 25 mg  25 mg Oral Daily    pantoprazole  (PROTONIX ) tablet 40 mg  40 mg Oral Daily    tiotropium bromide  (SPIRIVA  RESPIMAT) 2.5 MCG/ACT inhaler 2 puff  2 puff Inhalation Daily RT    [Held by provider] valsartan  (DIOVAN ) tablet 160 mg  160 mg Oral BID    sodium chloride  flush 0.9 % injection 5-40 mL  5-40 mL IntraVENous 2 times per day    sodium chloride  flush 0.9 % injection 5-40 mL  5-40 mL IntraVENous PRN    0.9 % sodium chloride  infusion   IntraVENous PRN    enoxaparin  Sodium (LOVENOX ) injection 30 mg  30 mg SubCUTAneous Daily    ondansetron  (ZOFRAN -ODT) disintegrating tablet 4 mg  4 mg Oral Q8H PRN    Or    ondansetron  (ZOFRAN ) injection 4 mg  4 mg IntraVENous Q6H PRN    polyethylene glycol (GLYCOLAX ) packet 17 g  17 g Oral Daily PRN    acetaminophen  (TYLENOL ) tablet 650 mg  650 mg Oral Q6H PRN    Or    acetaminophen  (TYLENOL ) suppository 650 mg  650 mg Rectal Q6H PRN    [Held by provider] midodrine  (PROAMATINE ) tablet 10 mg  10 mg Oral TID WC    fluticasone  furoate-vilanterol (BREO ELLIPTA ) 100-25 MCG/ACT inhaler 1 puff  1 puff Inhalation Daily RT    *Vancomycin  dosing per pharmacy (DPL)  1 each Other RX Placeholder            Dragon medical dictation software was used for portions of this report. Unintended errors may occur.   BELYNDA HOOF, MD  09/01/2024             "

## 2024-09-01 NOTE — Care Coordination (Addendum)
"  [facility]   Care Manager Update    Patient Name: ANANIAH [name]     MRN: [id number]   Age: 73 y.o.   Sex: female                   Primary Care Physician To, Garnette RAMAN, MD  Unit/Room: 6628/6628  Admit Date: 08/27/2024   Length of stay: 5  Admitting Diagnosis: Hypotension [I95.9]  AKI (acute kidney injury) [N17.9]  Hypotension, unspecified hypotension type [I95.9]  Open wound of foot excluding toes [S91.309A]   Primary Payer [employer] [place] [employer]  Current Attending Physician: Curt Spears, MD      Current Therapy and/or Physician Recommendation for Discharge Plan:       [facility] SN wound care      Patient/Authorized Representative Freedom of Choice D/C Plan:       [facility]      Managed plan requiring authorization for post-acute rehab needs:      yes    Primary Payer [employer] [place] [employer]      DME anticipated or ordered (including wound vacs):      no  - If Yes, list DME below        Infusion services anticipated or confirmed:      no   - If Yes, list need below        Positive Social Determinants of Health screening or indigent needs identified:    no   - If Yes, list need(s) and intervents below: i.e. medication, DME, housing assistance; [facility] referral, [facility] referral      Discharge Plan Updates September 01, 2024     Addendum 1448: CM spoke with Pt's Dtr who stated Pt open with [facility] for PT/OT/SN-wound care. CM sent referral and ROC order. Dtr stated MD mentioned possible DC tomorrow. Dtr requested call from MD and if RN can provide Dtr with timeframe of actual DC tomorrow. CM secure chatted both MD and RN.     Chart reviewed. Plan for home with [facility], Dtr knows name of agency. Dtr caregiver.       Signed:   LARRAINE DELENA SOLIAN , Case Manager  September 01, 2024  10:02 AM  Department Phone: [phone]   "

## 2024-09-01 NOTE — Progress Notes (Signed)
 "  PHYSICAL THERAPY EVALUATION     Acknowledge Orders  Time  PT Charge Capture  Rehab Caseload Tracker  [place] AM-PAC 6 Clicks Basic Mobility Inpatient Short Form  -    Patient: Susan [name] (73 y.o. female)  Room: 6628/6628    Primary Diagnosis: Hypotension [I95.9]  AKI (acute kidney injury) [N17.9]  Hypotension, unspecified hypotension type [I95.9]  Open wound of foot excluding toes [S91.309A]       Date of Admission: 08/27/2024   Length of Stay:  5 day(s)  Insurance: Payor: [place] [employer] [place] / Plan: [employer] [place] [employer] / Product Type: *No Product type* /      Date: 09/01/2024  Time In: 1040       Time Out: 1103   Total Minutes: 23    Isolation:  Contact       MDRO: MRSA  Current diet order: ADULT DIET; Regular; Low Sodium (2 gm)  ADULT ORAL NUTRITION SUPPLEMENT; Breakfast, Dinner; Standard 4 oz Oral Supplement    Precautions: falls   Ordered weight bearing status: weight bearing as tolerated (B) feet     ASSESSMENT:      Pt seen at [facility] on 11/26 for hypotension; pt with suspect sepsis, cellulitis R foot, acute worsening of chronic anemia, (B) foot wounds (per podiatry stable without evidence of acute infectious process); Pt was hospitalized 7/14-05/02/24 for R foot abscess/osteomyelitis requiring wound VAC, L foot abscess, and PAD. Pt with a PMH of HTN, HLD, chronic respiratory failure, COPD, PAD, bipolar disorder    Based on the objective data described below, the patient presents with  - decreased functional activity tolerance  - decreased independence with bed mobility requiring (S)  - decreased independence with functional transfers requiring Mod A sit<>stand  - c/o 10/10 pain weight bearing - reported pain when working with HHPT, however, she was able to manage   - declined SNF, reported bad past experience - was going to speak with HHPT about increasing visits from 2x/week to 3x/week    Patient would benefit from skilled acute PT to address impairments listed above in  order to maximize functional independence and safety.     Patient's rehabilitation potential for below stated goals: fair    Recommendations:  Recommend continued physical therapy during acute stay. Occupational Therapy. Recommend out of bed activity to counteract ill effects of bedrest, with assistance from staff as needed.  Discharge Recommendations: Home with family support and HHPT.  Further Equipment Recommendations for Discharge: No DME needs, has DME available at home.     Functional Outcome Measure:    AM-PAC: AM-PAC Inpatient Mobility Raw Score : 15  -  SNF:  Current research shows that an AM-PAC score of 17 or less is not associated with a discharge to the patient's home setting.  Based on an AM-PAC score and their current ADL deficits; it is recommended that the patient have 3-5 sessions per week of Physical Therapy at d/c to increase the patient's independence.     This AMPAC score should be considered in conjunction with interdisciplinary team recommendations to determine the most appropriate discharge setting. Patient's social support, diagnosis, medical stability, and prior level of function should also be taken into consideration.    Pt declining SNF, wanting to return home with HHPT    PRIOR LEVEL OF FUNCTION / HOME ENVIRONMENT:      Information was obtained by patient    Home environment: Pt lives with daughter and son-in-law in single story  home with 2 STE (step, patio, step entry) without handrails  Prior level of function: Pt reported walking ~10-20ft with HHPT using FWW and CGA. Pt required occasional assist with mobility (transfer bed<>WC/commode) and ADLs.  Home equipment: rollator, wheelchair, bedside commode    PLAN:      Patient will benefit from skilled Physical Therapy intervention to address the above impairments to return to prior level of function. PT Plan of Care: 3 times/week, 5 times/week.    Patient will achieve PT goals in 1 week. Goals were created with input from the  patient.    PHYSICAL THERAPY GOALS:     - Patient will be modified independent with supine <> sit transitions in preparation for EOB activities.   - Patient will be contact guard with sit <> stand transitions in preparation for transfer training.   - Patient will be contact guard with transfers in preparation for OOB activities and ambulation.    - Patient will ambulate CGA for 15 feet with the least restrictive device to promote functional independence at home.   - Patient will ascend/descend 2 stairs with handrail(s) with minimal assistance to enter home and/or go to upstairs bedroom/bathroom.    PLANNED INTERVENTIONS:      Skilled Physical Therapy services will provide functional mobility training, therapeutic exercises, therapeutic activities, patient/caregiver education as indicated.  Skilled Physical Therapy services will modify and progress therapeutic interventions, address functional mobility deficits, address ROM and strength deficits, analyze and cue movement patterns and assess and modify postural abnormalities to reach the stated goals.    COMMUNICATION/EDUCATION:     Education: Ill effects of bedrest, benefits of activity, activity as tolerated to counteract ill effects of bedrest, promote healing and return to prior level of function, call staff for assistance, safety, DME, disposition, role of PT, PT plan of care, all questions answered.    Education provided to: patient  Opportunity for questions and clarification was provided.    Readiness to learn indicated by: verbalized understanding    Barriers to learning/limitations: none    Comprehension: Patient communicated comprehension      SUBJECTIVE:      Patient agreeable to participate  Patient reports 10/10 pain before treatment and 10/10 pain at conclusion of treatment.  Pain Location: initially L foot, while standing R foot - received pain medication prior to sesion    OBJECTIVE DATA SUMMARY:      Orders, labs, and chart reviewed on Susan C  [name]. Communicated with patient's nurse (patient ok to be seen by PT).     Present illness history:   Patient Active Problem List    Diagnosis Date Noted    Hypotension 08/27/2024    Foot abscess 04/15/2024    OSA (obstructive sleep apnea)     Dyslipidemia     Asthma     Psychiatric disorder     Chronic obstructive pulmonary disease (HCC)     Bipolar 1 disorder (HCC)     Peripheral neuropathy 06/19/2013    Hypertension 06/19/2013      Previous medical history:   Past Medical History:   Diagnosis Date    Hyperlipidemia     Hypertension         PATIENT FOUND:     Supine in bed. (+) bed alarm. (+) nurse present.    COGNITIVE STATUS:     Mental Status:  Oriented x3   Communication:  patient interacts appropriately   Follows commands:  follows two step commands/direction   General cognition:  mood and affect normal, appropriate to situation.    Safety/Judgement:  needs cueing for safety and precautions       EXTREMITIES ASSESSMENT:      Range Of Motion:  BUE: WFL  BLE: AROM range of motion is WFL.      Strength:    BUE: WFL  RLE: Strength is grossly graded as 3+/5  LLE: Strength is grossly graded as 3+/5    THERAPEUTIC ACTIVITIES: 8 minutes     Bed mobility:  Scooting: Mod (I) to HOB   Sup->sit: (S)  Sit->supine: (S)     Transfers: c/o significant R foot pain with weight bearing, limiting further mobility  Sit -> stand: Mod A cues hand placement, anterior weight shift   Stand -> sit: Min A cues hand placement     Balance:   Static sitting balance:          good       Dynamic sitting balance:     good       Static standing balance:      poor      Dynamic standing balance: unable       GAIT TRAINING:      Gait Training:  Unable    Stair Training:   unable    ACTIVITY TOLERANCE:     - activity is limited by pain  - generalized muscle weakness  - activity tolerance limited by deconditioning    FINAL LOCATION:     Positioned in bed, all needs within reach. Patient agrees to call for assistance. Head of bed elevated. As  found. (+) bed alarm. Nurse notified.    Thank you for this referral.  Kemoni Ortega E Ikeem Cleckler, PT , DPT  "

## 2024-09-01 NOTE — Progress Notes (Signed)
"  [facility] Pharmacy Consult: Vancomycin  Dosing - Follow up  Susan [name]  73 y.o.  female  MRN: [id number]        Day # 5 of Vancomycin  maintenance tx  Indication: Skin & Soft tissue infection (SSTI) , hx OM  Other Antimicrobials:  None (ID has signed off)  Vancomycin  dose prior to level drawn: Vancomycin  750 mg IV every 24 hours   Last dose Vancomycin  750mg  on 11/30 @1324   Vancomycin  trough level: 12.2 mcg/mL which makes this Vancomycin  level subtherapeutic  Trough goal: 15-20    Assessment & Plan     Planning to adjust regimen to Vancomycin  1000 mg IV every 24 hours.    Labs / Follow up     Trough History Lab Results   Component Value Date/Time    VANCOTROUGH 12.2 (H) 09/01/2024 12:00 PM      Serum Creatinine Lab Results   Component Value Date    CREATININE 0.83 08/31/2024    CREATININE 1.09 (H) 08/30/2024      Creatinine Clearance Estimated Creatinine Clearance: 60 mL/min (based on SCr of 0.83 mg/dL).   BUN Lab Results   Component Value Date/Time    BUN 10 08/31/2024 07:45 AM      WBC Lab Results   Component Value Date/Time    WBC 6.6 08/31/2024 07:45 AM        Next Vancomycin  level: 09/03/2024 @1300     Pharmacy to make changes to dose and/or frequency based on clinical status.  Susan [name], [facility]   September 01, 2024   [facility]  [phone]    "

## 2024-09-01 NOTE — Progress Notes (Signed)
 "  [facility]    H&P    Assessment:   Susan [name] is a 73 y.o. female currently admitted with findings of hypotension which improved with hydration.  Concerns for sepsis.    Bilateral lower extremity wounds are stable without evidence of acute infectious process.    There is no leukocytosis, she is afebrile at this time          Plan:   Patient was examined bedside.   Chart reviewed  Blood cultures negative  Wounds are stable- no clinical signs of infection  Continue offloading and dressing changes as per wound care  She will follow up with me in clinic in one week   No further podiatric intervention planned  Podiatry signing off        Thank you for allowing me to participate in the care of this patient.    Subjective:   Susan [name] for bilateral foot wounds. She  is a 73 y.o. female with PMH significant for as stated above, who was admitted on 08/27/2024 for Hypotension [I95.9]  AKI (acute kidney injury) [N17.9]  Hypotension, unspecified hypotension type [I95.9]  Open wound of foot excluding toes [S91.309A]     Allergies   Allergen Reactions    Penicillins Other (See Comments)     States unknown reaction     Mother told her as a child she was allergic       Outpatient Medications Marked as Taking for the 08/27/24 encounter [facility] Encounter)   Medication Sig Dispense Refill    TRELEGY ELLIPTA  200-62.5-25 MCG/ACT AEPB inhaler Inhale 1 puff into the lungs daily      atorvastatin  (LIPITOR ) 80 MG tablet Take 1 tablet by mouth nightly      valsartan  (DIOVAN ) 160 MG tablet Take 1 tablet by mouth 2 times daily      QUEtiapine  (SEROQUEL ) 100 MG tablet Take 1 tablet by mouth nightly      carvedilol  (COREG ) 12.5 MG tablet Take 1 tablet by mouth 2 times daily (with meals)      furosemide  (LASIX ) 20 MG tablet Take 1 tablet by mouth daily      potassium chloride  (KLOR-CON ) 10 MEQ extended release tablet Take 1 tablet by mouth daily with food      clopidogrel  (PLAVIX ) 75 MG  tablet Take 1 tablet by mouth daily      MYRBETRIQ  25 MG TB24 Take 1 tablet by mouth daily      ipratropium 0.5 mg-albuterol  2.5 mg (DUONEB ) 0.5-2.5 (3) MG/3ML SOLN nebulizer solution Inhale 3 mLs into the lungs every 4 hours as needed for Shortness of Breath      aspirin  81 MG EC tablet Take 1 tablet by mouth daily          Current Facility-Administered Medications:     carvedilol  (COREG ) tablet 3.125 mg, 3.125 mg, Oral, BID WC, Mehmood, Khalid, MD    vancomycin  (VANCOCIN ) 1000 mg in sodium chloride  0.9% 250 mL IVPB, 1,000 mg, IntraVENous, Q24H, Mehmood, Khalid, MD    [START ON 09/03/2024] Vancomycin  Trough Due, 1 each, Other, Once, Mehmood, Khalid, MD    potassium chloride  (KLOR-CON  M) extended release tablet 20 mEq, 20 mEq, Oral, Daily with breakfast, Mehmood, Khalid, MD, 20 mEq at 09/01/24 0930    [Held by provider] amLODIPine  (NORVASC ) tablet 5 mg, 5 mg, Oral, Daily, Mehmood, Khalid, MD, 5 mg at 09/01/24 0930    folic acid  injection 1 mg, 1 mg, IntraVENous, Daily, Mehmood,  Khalid, MD, 1 mg at 09/01/24 1043    0.9 % sodium chloride  infusion, , IntraVENous, PRN, Mehmood, Khalid, MD    HYDROcodone -acetaminophen  (NORCO ) 5-325 MG per tablet 1 tablet, 1 tablet, Oral, Q6H PRN, Mehmood, Khalid, MD, 1 tablet at 09/01/24 9070    aspirin  EC tablet 81 mg, 81 mg, Oral, Daily, Lonnie Passy, MD, 81 mg at 09/01/24 0930    atorvastatin  (LIPITOR ) tablet 80 mg, 80 mg, Oral, QHS, Verma, Sourabh, MD, 80 mg at 08/31/24 2137    clopidogrel  (PLAVIX ) tablet 75 mg, 75 mg, Oral, Daily, Lonnie Passy, MD, 75 mg at 09/01/24 0930    ipratropium 0.5 mg-albuterol  2.5 mg (DUONEB ) nebulizer solution 1 Dose, 1 Dose, Inhalation, Q4H PRN, Verma, Sourabh, MD    mirabegron  (MYRBETRIQ ) extended release tablet 25 mg, 25 mg, Oral, Daily, Verma, Sourabh, MD, 25 mg at 09/01/24 0930    pantoprazole  (PROTONIX ) tablet 40 mg, 40 mg, Oral, Daily, Verma, Sourabh, MD, 40 mg at 09/01/24 0930    [DISCONTINUED] budesonide -formoterol  (SYMBICORT ) 80-4.5 MCG/ACT  inhaler 2 puff, 2 puff, Inhalation, BID RT **AND** tiotropium bromide  (SPIRIVA  RESPIMAT) 2.5 MCG/ACT inhaler 2 puff, 2 puff, Inhalation, Daily RT, Verma, Sourabh, MD, 2 puff at 08/31/24 0931    [Held by provider] valsartan  (DIOVAN ) tablet 160 mg, 160 mg, Oral, BID, Lonnie Passy, MD    sodium chloride  flush 0.9 % injection 5-40 mL, 5-40 mL, IntraVENous, 2 times per day, Lonnie Passy, MD, 10 mL at 09/01/24 0930    sodium chloride  flush 0.9 % injection 5-40 mL, 5-40 mL, IntraVENous, PRN, Verma, Sourabh, MD    0.9 % sodium chloride  infusion, , IntraVENous, PRN, Verma, Sourabh, MD    enoxaparin  Sodium (LOVENOX ) injection 30 mg, 30 mg, SubCUTAneous, Daily, Lonnie Passy, MD, 30 mg at 09/01/24 0930    ondansetron  (ZOFRAN -ODT) disintegrating tablet 4 mg, 4 mg, Oral, Q8H PRN **OR** ondansetron  (ZOFRAN ) injection 4 mg, 4 mg, IntraVENous, Q6H PRN, Lonnie Passy, MD, 4 mg at 08/30/24 2155    polyethylene glycol (GLYCOLAX ) packet 17 g, 17 g, Oral, Daily PRN, Lonnie Passy, MD, 17 g at 08/30/24 1201    acetaminophen  (TYLENOL ) tablet 650 mg, 650 mg, Oral, Q6H PRN, 650 mg at 08/31/24 1136 **OR** acetaminophen  (TYLENOL ) suppository 650 mg, 650 mg, Rectal, Q6H PRN, Lonnie Passy, MD    [Held by provider] midodrine  (PROAMATINE ) tablet 10 mg, 10 mg, Oral, TID WC, Lonnie Passy, MD, 10 mg at 08/30/24 0913    fluticasone  furoate-vilanterol (BREO ELLIPTA ) 100-25 MCG/ACT inhaler 1 puff, 1 puff, Inhalation, Daily RT, Lonnie Passy, MD, 1 puff at 08/31/24 0930    *Vancomycin  dosing per pharmacy (DPL), 1 each, Other, RX Placeholder, Lonnie Passy, MD    Past Medical History:   Diagnosis Date    Hyperlipidemia     Hypertension      Past Surgical History:   Procedure Laterality Date    LEG SURGERY Right 04/17/2024    RIGHT HEEL IRRIGATION AND DEBRIDEMENT WITH WOUND VAC APPLICATION performed by Rosamond Inches, DPM at [facility] MAIN OR    LEG SURGERY Bilateral 04/25/2024    DEBRIDEMENT RIGHT FOOT WITH GRAFT APPLICATION; APPLICATION OF WOUND  VAC;  DRAINAGE OF ABSCESS LEFT FOOT performed by Rosamond Inches, DPM at [facility] MAIN OR     No family history on file.  Social History     Socioeconomic History    Marital status: Legally Separated   Tobacco Use    Smoking status: Former     Types: Cigarettes    Smokeless tobacco: Never  Substance and Sexual Activity    Alcohol use: Not Currently    Drug use: Never    Sexual activity: Not Currently     Social Drivers of Health     Food Insecurity: No Food Insecurity (08/27/2024)    Hunger Vital Sign     Worried About Running Out of Food in the Last Year: Never true     Ran Out of Food in the Last Year: Never true   Transportation Needs: No Transportation Needs (08/27/2024)    PRAPARE - Therapist, Art (Medical): No     Lack of Transportation (Non-Medical): No   Housing Stability: Low Risk  (08/27/2024)    Housing Stability Vital Sign     Unable to Pay for Housing in the Last Year: No     Number of Times Moved in the Last Year: 0     Homeless in the Last Year: No           REVIEW OF SYSTEMS:     Constitutional: negative for chills   Skin: negative for itching   HENT: negative for headaches   Eyes: negative for blurred vision   Cardiovascular: negative for chest pain   Respiratory: negative for shortness of breath   Gastointestinal: negative for abdominal pain   Genitourinary: not assessed   Musculoskeletal: negative for falls   Endo: negative for cold intolerance.   Heme: negative for anemia   Allergies: negative for hives   Neurological: negative for dizziness   Psychiatric:  not assessed           Vitals:    09/01/24 0423 09/01/24 0500 09/01/24 0858 09/01/24 1207   BP: 99/63  112/76 114/66   Pulse: 77  82 70   Resp: 17  18 18    Temp: 97.7 F (36.5 C)  97.7 F (36.5 C) 98.1 F (36.7 C)   TempSrc: Oral  Oral Axillary   SpO2: 99%  100% 98%   Weight:  63 kg (138 lb 14.2 oz)     Height:             Focused Physical Exam:    Bilateral lower extremity wounds with granular wound base and  serosanguineous drainage.  No deep tracking probing or tunneling  No periwound erythema or proximal streaking.     Vascular Exam:  1+ pedal pulses     Neurological Exam:  Sensation is intact to light touch     Orthopedic:  Achilles tendon contractures bilaterally      Recent Labs     08/31/24  0745   WBC 6.6   HCT 29.7*   MCV 89.5     Recent Labs     08/31/24  0745   NA 133*   CO2 26   ANIONGAP 8   CALCIUM  9.2   BUN 10   GLUCOSE 86     No results for input(s): ALBUMIN , ALKPHOS in the last 72 hours.    Invalid input(s): TOTPR, SGOTAST, BILIT, BILID, SGPTALT, AGRAT, GLOBULIN  No results for input(s): SOURCEUR in the last 72 hours.    Invalid input(s): UGLU, UBILIRUBIN, UKET, USPG, UOCB, UPH, UPSC, UUROBILISQ, UNITR, URINELEUKOC  No results found for: GLU, GLUCPOC            Jovoni Borkenhagen DPM   [facility]     "

## 2024-09-01 NOTE — Progress Notes (Signed)
"  Chaplain Services  Unable to Visit Patient    Start Time: 1355  End Time: 1400    Chaplain attempted to conduct a Consultation and Spiritual Assessment for Susan [name], who is a 73 y.o.,female.  According to the patient's EMR Religious Affiliation is: None.     Patient is asleep and isolation protocols and is not available to be assessed at this time.  No Family present.  Offered prayer remotely on patient's behalf.     Assessment:  Patient has no known religious/cultural needs that will affect patient's preferences in health care.  Patient has no known spiritual or religious issues which require intervention at this time.     Plan:  Chaplains will continue to follow and will provide pastoral care on an as needed/requested basis.  Chaplain recommends bedside caregivers page Duty Chaplain if patient shows signs of acute spiritual or emotional distress.     KEVEN JINNY ANGELO Elia  Spiritual Care  [phone]  "

## 2024-09-01 NOTE — Plan of Care (Signed)
"    Problem: Safety - Adult  Goal: Free from fall injury  Outcome: Progressing     Problem: Pain  Goal: Verbalizes/displays adequate comfort level or baseline comfort level  Outcome: Progressing     Problem: Skin/Tissue Integrity  Goal: Skin integrity remains intact  Description: 1.  Monitor for areas of redness and/or skin breakdown  2.  Assess vascular access sites hourly  3.  Every 4-6 hours minimum:  Change oxygen saturation probe site  4.  Every 4-6 hours:  If on nasal continuous positive airway pressure, respiratory therapy assess nares and determine need for appliance change or resting period  Outcome: Progressing     "

## 2024-09-01 NOTE — Plan of Care (Signed)
"    Problem: Safety - Adult  Goal: Free from fall injury  09/01/2024 2147 by Delores Tinnie Knee, RN  Outcome: Progressing  09/01/2024 1831 by Gretel Spates, RN  Outcome: Progressing     Problem: Pain  Goal: Verbalizes/displays adequate comfort level or baseline comfort level  09/01/2024 2147 by Delores Tinnie Knee, RN  Outcome: Progressing  09/01/2024 1831 by Gretel Spates, RN  Outcome: Progressing     Problem: Skin/Tissue Integrity  Goal: Skin integrity remains intact  Description: 1.  Monitor for areas of redness and/or skin breakdown  2.  Assess vascular access sites hourly  3.  Every 4-6 hours minimum:  Change oxygen saturation probe site  4.  Every 4-6 hours:  If on nasal continuous positive airway pressure, respiratory therapy assess nares and determine need for appliance change or resting period  09/01/2024 2147 by Delores Tinnie Knee, RN  Outcome: Progressing  09/01/2024 1831 by Gretel Spates, RN  Outcome: Progressing     Problem: Pain  Goal: Verbalizes/displays adequate comfort level or baseline comfort level  09/01/2024 2147 by Delores Tinnie Knee, RN  Outcome: Progressing  09/01/2024 1831 by Gretel Spates, RN  Outcome: Progressing     Problem: Skin/Tissue Integrity  Goal: Skin integrity remains intact  Description: 1.  Monitor for areas of redness and/or skin breakdown  2.  Assess vascular access sites hourly  3.  Every 4-6 hours minimum:  Change oxygen saturation probe site  4.  Every 4-6 hours:  If on nasal continuous positive airway pressure, respiratory therapy assess nares and determine need for appliance change or resting period  09/01/2024 2147 by Delores Tinnie Knee, RN  Outcome: Progressing  09/01/2024 1831 by Gretel Spates, RN  Outcome: Progressing     "

## 2024-09-02 LAB — CULTURE, WOUND W GRAM STAIN

## 2024-09-02 LAB — CULTURE, BLOOD 1: BLOOD CULTURE RESULT: NO GROWTH

## 2024-09-02 LAB — CREATININE
Creatinine: 0.84 mg/dL (ref 0.55–1.02)
GFR African American: >60
GFR Non-African American: >60

## 2024-09-02 LAB — CULTURE, BLOOD 2: BLOOD CULTURE RESULT: NO GROWTH

## 2024-09-02 LAB — CULTURE, ANAEROBIC

## 2024-09-02 MED ORDER — HYDROCODONE-ACETAMINOPHEN 5-325 MG PO TABS
5-325 | ORAL_TABLET | Freq: Four times a day (QID) | ORAL | 0 refills | Status: AC | PRN
Start: 2024-09-02 — End: 2024-09-05

## 2024-09-02 MED ORDER — LINEZOLID 600 MG PO TABS
600 | ORAL_TABLET | Freq: Two times a day (BID) | ORAL | 0 refills | 7.00000 days | Status: AC
Start: 2024-09-02 — End: 2024-09-07

## 2024-09-02 MED FILL — SODIUM CHLORIDE FLUSH 0.9 % IV SOLN: 0.9 % | INTRAVENOUS | Qty: 10 | Fill #0

## 2024-09-02 MED FILL — ASPIRIN LOW DOSE 81 MG PO TBEC: 81 mg | ORAL | Qty: 1 | Fill #0

## 2024-09-02 MED FILL — HYDROCODONE-ACETAMINOPHEN 5-325 MG PO TABS: 5-325 mg | ORAL | Qty: 1 | Fill #0

## 2024-09-02 MED FILL — ATORVASTATIN CALCIUM 40 MG PO TABS: 40 mg | ORAL | Qty: 2 | Fill #0

## 2024-09-02 MED FILL — LOVENOX 30 MG/0.3ML IJ SOSY: 30 MG/0.3ML | INTRAMUSCULAR | Qty: 0.3 | Fill #0

## 2024-09-02 MED FILL — CARVEDILOL 6.25 MG PO TABS: 6.25 mg | ORAL | Qty: 1 | Fill #0

## 2024-09-02 MED FILL — CLOPIDOGREL BISULFATE 75 MG PO TABS: 75 mg | ORAL | Qty: 1 | Fill #0

## 2024-09-02 MED FILL — KLOR-CON M20 20 MEQ PO TBCR: 20 meq | ORAL | Qty: 1 | Fill #0

## 2024-09-02 MED FILL — PANTOPRAZOLE SODIUM 40 MG PO TBEC: 40 mg | ORAL | Qty: 1 | Fill #0

## 2024-09-02 MED FILL — FOLIC ACID 5 MG/ML IJ SOLN: 5 mg/mL | INTRAMUSCULAR | Qty: 0.2 | Fill #0

## 2024-09-02 MED FILL — VANCOMYCIN (VANCOCIN) 1000 MG IN SODIUM CHLORIDE 0.9% 250 ML IVPB: Qty: 250 | Fill #0

## 2024-09-02 MED FILL — MIRABEGRON ER 25 MG PO TB24: 25 mg | ORAL | Qty: 1 | Fill #0

## 2024-09-02 NOTE — Wound Care (Signed)
"  Wound care had a wound consult for wound/LLE. Wound care nurse went to patient's room and patient was being cleaned up. Wound care nurse then went to another room on the same floor then see patient after. Multiple staff came in and said that the patient would like me to see her at that very moment which nurse could not. Once wound care nurse was done went back to patient's room. Patient stated I only want you to re-wrap my leg wound care nurse said that she has to see the wound take pictures etc. Patient then declined. Primary nurse notified.     Charmaine Mechanic, RN     [facility]     Inpatient Wound, Ostomy, Continence Nurse  "

## 2024-09-02 NOTE — Care Coordination (Signed)
"  [facility]   Case Management Discharge Note    Patient Name: Susan [name]     MRN: [id number]   Age: 73 y.o.   Sex: female                   Primary Care Physician To, Garnette RAMAN, MD  Unit/Room: 6628/6628  Admit Date: 08/27/2024   Length of stay: 6  Admitting Diagnosis: Hypotension [I95.9]  AKI (acute kidney injury) [N17.9]  Hypotension, unspecified hypotension type [I95.9]  Open wound of foot excluding toes [S91.309A]   Primary Payer [employer]  Current Attending Physician: Curt Spears, MD    Discharge Plan    Home with [facility]    Discharge Date:  09/02/2024        IMM Given and Documented (if applicable):  yes  Patient was provided with the 2nd Important Message from Medicare (IMM) on the day of discharge and was informed of their right to remain in the hospital for up to four (4) hours after receipt of the IMM. The patient acknowledged understanding of this option and elected to proceed with discharge prior to the end of the 4-hour period. (Discussed with pt daughter)      Documented SDOH concerns were addressed this admission (place n/a if  no concerns documented): no      Method of Transportation and Time if scheduled:  Family.     Complete if Discharging with Home Health    Agency Name:  [facility]  Home health order placed and routed to agency; F2F complete for government insurance?  yes  Name of Home Health agency representative that confirmed acceptance:  accepted in cclink by Jeidy  Was agency able to provide a SOC date: no  Patient/Authorized decision maker confirmed this agency was their [facility] or [facility] decided by insurance?  yes  Patient confirmed active PCP to utilize home health services:  yes    Patient/Family, RN, physician aware of DCP and transport time:  no         Signed:   Terie Lear JONELLE KURK, RN , Case Manager  September 02, 2024  10:12 AM  Department Phone: [phone]    "

## 2024-09-02 NOTE — Discharge Summary (Signed)
 "DISCHARGE SUMMARY     Patient Name: Susan [name]  Medical Record Number: [id number]  Date of Birth: December 22, 1950  Discharge Provider: BELYNDA HOOF, MD  Primary Care Provider: To, Garnette RAMAN, MD    Admit date: 08/27/2024  Discharge Date:  09/02/2024 Time: 9:51 AM  Discharge Disposition: Home  Code Status: Full Code    Follow-up appointments:   .[facility])  [address] [place] [place] [place]  [address]  [phone]        Rosamond Inches, DPM  [address] [place] [address]  [phone]    Follow up on 09/05/2024      To, Garnette RAMAN, MD  [address]  [place] [address]  [phone]    Follow up in 1 week(s)          Follow-up recommendations:   Follow with PCP and podiatry.  Discharge diagnosis:  Primary diagnosis           Sepsis [facility])                                                                    Discharge Medications:               Current Discharge Medication List        START taking these medications    Details   HYDROcodone -acetaminophen  (NORCO ) 5-325 MG per tablet Take 1 tablet by mouth every 6 hours as needed for Pain (severe pain) for up to 3 days. Max Daily Amount: 4 tablets  Qty: 12 tablet, Refills: 0    Comments: Reduce doses taken as pain becomes manageable  Associated Diagnoses: Open wound of foot excluding toes      linezolid  (ZYVOX ) 600 MG tablet Take 1 tablet by mouth 2 times daily for 5 days  Qty: 10 tablet, Refills: 0               Current Discharge Medication List                       Current Discharge Medication List        CONTINUE these medications which have NOT CHANGED    Details   TRELEGY ELLIPTA  200-62.5-25 MCG/ACT AEPB inhaler Inhale 1 puff into the lungs daily      atorvastatin  (LIPITOR ) 80 MG tablet Take 1 tablet by mouth nightly      carvedilol  (COREG ) 12.5 MG tablet Take 1 tablet by mouth 2 times daily (with meals)      clopidogrel  (PLAVIX ) 75 MG tablet Take 1 tablet by mouth daily      MYRBETRIQ  25 MG TB24 Take 1 tablet  by mouth daily      ipratropium 0.5 mg-albuterol  2.5 mg (DUONEB ) 0.5-2.5 (3) MG/3ML SOLN nebulizer solution Inhale 3 mLs into the lungs every 4 hours as needed for Shortness of Breath      aspirin  81 MG EC tablet Take 1 tablet by mouth daily      pantoprazole  (PROTONIX ) 40 MG tablet Take 1 tablet by mouth daily      sodium hypochlorite (DAKINS) 0.125 % SOLN external solution Apply topically daily  Refills: 2  Current Discharge Medication List        STOP taking these medications       valsartan  (DIOVAN ) 160 MG tablet Comments:   Reason for Stopping:         QUEtiapine  (SEROQUEL ) 100 MG tablet Comments:   Reason for Stopping:         amLODIPine  (NORVASC ) 5 MG tablet Comments:   Reason for Stopping:         furosemide  (LASIX ) 20 MG tablet Comments:   Reason for Stopping:         potassium chloride  (KLOR-CON ) 10 MEQ extended release tablet Comments:   Reason for Stopping:         docusate sodium  (COLACE, DULCOLAX) 100 MG CAPS Comments:   Reason for Stopping:                Discharge Diet:            Diet: cardiac diet    Consultants: ID and podiatry    Procedures:   * No surgery found *  No results found for this or any previous visit.        Recent Results (from the past 48 hours)   Vancomycin  Level, Trough    Collection Time: 09/01/24 12:00 PM   Result Value Ref Range    Vancomycin  Tr 12.2 (H) 5.0 - 10.0 mcg/ml         LFTS:No results for input(s): AST, ALT, LABALBU, BILITOT, ALKPHOS, LIPASE in the last 72 hours.    Invalid input(s): PROT, BU    Cardiac Injury Profile: No results for input(s): CKTOTAL, CKMB, TROPONINI in the last 72 hours.   Labs in Last 3 months: No results found for: TSH, VITD25, PSA, INR, GLUF, NTPROBNP, LABA1C, MALBCR       Lipids: No results for input(s): CHOL, HDL in the last 72 hours.    Invalid input(s): LDLCALCU  INR: No results for input(s): INR in the last 72 hours.        Microbiology-  Urine Cx: No results found for:  LABURIN  Blood Cx: No results found for: BC  Sputum Cx: No results found for: RESPCULTURE  Gram Stain:   Lab Results   Component Value Date    LABGRAM Rare WBC'S  No Organisms Seen   08/28/2024     PNA PCR: No results found for: PNPCRPNL  COVID19: No results found for: COVID19  Legionella Ag: No results found for: LEGIONELLAANTIGEN  Strep Ag: No results for input(s): STREPNEUMAGU in the last 72 hours.    Radiology reports as per the Radiologist  Radiology: XR CHEST (2 VW)  Result Date: 08/27/2024  CLINICAL INDICATION: Chest pain Pain. TECHNIQUE:  PA and lateral chest radiographs. FINDINGS:  The heart size is within normal limits. The mediastinal contours are unremarkable. The lungs are clear and the costophrenic angles are sharp. The osseous structures of the thorax are intact. There is a disc space narrowing and bridging osteophytes at several levels in the thoracic spine. There is no significant change compared with the 04/15/2024 chest x-ray.     IMPRESSION:  No radiographic evidence of acute cardiopulmonary disease. Electronically signed by: Lynwood Harden, MD 08/27/2024 12:56 PM EST          Workstation ID: [id number]              No results found for this or any previous visit.    History of presenting illness:( Per admitting M.D.)  Susan [name] is a 73 y.o. Black /  African American female who presents with hypotension   He has h/o copd with chronic hypoxic resp failure , had admission in August for right foot abscess/osteomyelitis requiring antibiotics and wound VAC at that time    Patient has had some generalized fatigue but otherwise denies any chest pain, shortness of breath, fevers, chills, cough, congestion, abdominal pain, nausea, vomiting   Has had some chronic leg pain including her right foot , right leg from knee downwards sec to pad   Pt has B/l Foot wounds , followed by wound care   She does have h/o hypertension , is on 3 anti hypertensive           Hospital course:   Sepsis,  POA  Cellulitis right foot due to MRSA  Acute worsening of chronic anemia  AKI  COPD  Chronic hypoxic respiratory failure  Bipolar disorder  PAD     Treatment plan  Patient admitted with  sepsis.  Patient was hypotensive admission.  Improved with IV hydration.    Recommend holding Norvasc  and Diovan .  Continue Coreg .  Follow with PCP.  Wound culture positive for MRSA.  Received IV vancomycin .  Discussed with ID.  Continue recommend ordered Levaquin at discharge.  I discussed with podiatrist at bedside.  Podiatry recommended stable for discharge and outpatient follow-up.  Patient received 1 unit packed RBC.  Hemoglobin improving with IV iron.  CBC today WBC 6.6 hemoglobin 9.8 platelets 4 44,000.  B12 541, iron saturation 8, ferritin 265, TIBC 133  AKI resolved.  BMP sodium 133 potassium 3.5 BUN 10 creatinine 0.83.   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  Wound care consulted.    Feeling better.  Skilled nursing was discussed with patient prefers to go home.  Stable for discharge home with home health.  Discharge plan discussed with patient and daughter on the phone.            Vitals on Discharge:  BP 116/72   Pulse 81   Temp 98.4 F (36.9 C) (Oral)   Resp 18   Ht 1.702 m (5' 7)   Wt 63 kg (138 lb 14.2 oz)   SpO2 95%   BMI 21.75 kg/m     Physical Exam:  Constitutional: Awake and alert, NAD  HENT: atraumatic, normocephalic, oropharynx clear    Eyes:   conjunctiva normal  Neck:   supple and trachea normal  Cardiovascular:  Regular rate and rhythm, heart sounds normal, intact distal pulses  Pulmonary/Chest Wall: breath sounds normal and effort normal  Abdominal:      appearance normal, soft, non-tender upon palpation,bowel sounds normal.  Neurological:   awake, alert and oriented, CN intact, generalized weakness..  Extremities: Dressing on both feet.  Left foot is slightly swollen.           Discharge condition:  Stable    Discharge activity and  restrictions: activity as tolerated    Time spent with discharging patient:   Discharge plan discussed with pt. All questions answered. Need for compliance with medicine and follow up emphasized. Pt verbalized the understanding of care. Time spent 35 minutes.      BELYNDA HOOF, MD  09/02/2024  9:51 AM    Copies: To, Garnette RAMAN, MD  "

## 2024-09-02 NOTE — Progress Notes (Signed)
 "  OCCUPATIONAL THERAPY EVALUATION   Acknowledge Orders  Time  OT Charge Capture  Rehab Caseload Tracker    [facility] AM-PAC 6 Clicks Daily Activity Inpatient Short Form  -    Patient: Susan [name] (73 y.o. female)  Room: 6628/6628    Primary Diagnosis: Hypotension [I95.9]  AKI (acute kidney injury) [N17.9]  Hypotension, unspecified hypotension type [I95.9]  Open wound of foot excluding toes [S91.309A]       Date of Admission: 08/27/2024   Length of Stay:  6 day(s)  Insurance: [place]or: [place] [employer] [place] / Plan: [employer] [place] [employer] / Product Type: *No Product type* /      Date: 09/02/2024  Time In: 0952        Time Out: 1020       Total Minutes: 28   Treatment time: 13 minutes  Treatment included: therapeutic activity, energy conservation, functional mobility retraining, functional balance retraining, ADI retraining, activity tolerance, education for positioning and/or educational instruction during/immediately following OT evaluation    Isolation:  Contact       MDRO: MRSA    Precautions: falls   Ordered weight bearing status: weight bearing as tolerated, per patient needs post op shoes    Current diet order: ADULT DIET; Regular; Low Sodium (2 gm)  ADULT ORAL NUTRITION SUPPLEMENT; Breakfast, Dinner; Standard 4 oz Oral Supplement    ASSESSMENT     Based on the objective data described below, the patient presents with     - decreased activity tolerance, increased fatigue impacting ADL completion  - completes bed mobility with SUP  - agreeable to EOB only, states she does not have post op shoes and does not want to trial transfers without them. States she will be d/cing later today and dtr will be brining them at d/c  - completes ADLs seated EOB, overall SBA for UB ADLS, Min/ModA for LB ADLs  - motivated to increase activity     Patient will benefit from skilled occupational therapy intervention to address the above impairments.    Patients rehabilitation potential is considered to be  Good for below stated goals.     Recommendations:  Recommend continued occupational therapy during acute stay.   Recommend out of bed activity to counteract ill effects of bedrest, with assistance from staff as needed.    Discharge Recommendations:   -  Skilled nursing facility (SNF):  Would benefit to improve independence in ADLs, strength, activity tolerance and balance to ensure a successful and sustainable return to prior level of function.  -  Patient/family would prefer to return home, would benefit from [facility] services and 24/7 supervision     FUNCTIONAL ASSESSMENT  AM-PAC Inpatient Daily Activity Raw Score: 16 (09/02/24 1110)    -  SNF:  Current research shows that an AM-PAC score of 17 or less is not associated with a discharge to the patient's home setting.  Based on an AM-PAC score and their current ADL deficits; it is recommended that the patient have 3-5 sessions per week of Occupational Therapy at d/c to increase the patient's independence.     This AMPAC score should be considered in conjunction with interdisciplinary team recommendations to determine the most appropriate discharge setting. Patient's social support, diagnosis, medical stability, and prior level of function should also be taken into consideration.    Equipment Recommendations for Discharge:   -  no needs anticipated; has DME available at home     PRIOR LEVEL OF FUNCTION  Information was obtained by: patient  Home environment: pt lives with dtr in single story home. Has tub/shower with transfer bench.   Prior level of function: States pt typically squat pivots to w/c, BSC from bed and occasionally requires (A). Daughter assists with ADL completion as needed   Prior level of Instrumental Activities of Daily Living: Patient does not perform any IADLs.  Home equipment: transfer tub bench, 3 in 1 bedside commode, rollator, wheelchair    PLAN      Patient will be followed by occupational therapy to address goals while hospitalized as  patient's status and schedule permit.   Patient to be seen 1-5 days x 4 weeks for Adaptive equipment, ADI training, activity tolerance, functional balance training, functional mobility training, therapeutic exercise, therapeutic activity, patient/caregiver education and training, home exercise program, neuromuscular re-education, and energy conservation     Patient and/or family have participated as able in goal setting and plan of care.    GOALS     OT goals initiated 09/02/2024 and will be met by patient within 10 sessions     -  Patient will perform grooming task with ModI in order to improve participation in ADLs.  -  Patient will perform upper body ADLs with ModI AE/DME as needed in order to improve participation in ADLs.  -  Patient will perform lower body ADLs with CGA AE/DME as needed in order to improve participation in ADLs.  -  Patient will be CGA with functional transfers using the least restrictive device in order to improve functional mobility.      EDUCATION/COMMUNICATION     Barriers to learning/limitations: None    Education provided un:[id number] on (+) role of OT, (+) OT plan of care, (+) Instructed patient in the benefits of maintaining activity tolerance, functional mobility, and independence with self care tasks during acute stay  to ensure safe return home and to baseline. Encouraged patient to increase frequency and duration OOB, be out of bed for all meals, perform daily ADLs (as approved by RN/MD regarding bathing etc), and performing functional mobility to/from bathroom with staff assistance as needed.    Educational handouts issued: none this session    Patient / family response to education: showing interest, trying to perform skills    SUBJECTIVE     Patient I was doing okay    Pain Assessment: BIL feet    OBJECTIVE DATA SUMMARY     Orders, labs, and chart reviewed on [facility]. Communicated with nursing staff. Patient cleared to participate in Occupational Therapy  evaluation.    Patient was admitted to the hospital on 08/27/2024 with   Chief Complaint   Patient presents with    Hypotension     Present illness history:   Patient Active Problem List    Diagnosis Date Noted    Sepsis (HCC) 09/02/2024    Hypotension 08/27/2024    Foot abscess 04/15/2024    OSA (obstructive sleep apnea)     Dyslipidemia     Asthma     Psychiatric disorder     Chronic obstructive pulmonary disease (HCC)     Bipolar 1 disorder (HCC)     Peripheral neuropathy 06/19/2013    Hypertension 06/19/2013      Previous medical history:   Past Medical History:   Diagnosis Date    Hyperlipidemia     Hypertension        PATIENT FOUND     Bed, (-) bed/chair exit alarm    COGNITIVE  STATUS       Mental status:  Orientation: Patient is oriented x 3  Communication: grossly intact  Attention Span:  appropriate attention/concentration for age  General Cognition:  intact   Follows commands: able to follow simple 1 step commands  Safety/Judgement: good safety awareness  Hearing:   grossly intact  Vision:   grossly intact    UPPER EXTREMITY ASSESSMENT     Dominance:right  RIGHT: active range of motion is WFL  Strength: WFL's   LEFT:   active range of motion is WFL  Strength: WFL's       ACTIVITIES OF DAILY LIVING (8 Minutes)     Based on direct observation, simulation and clinical assessment.  (clincial judgement based on: activity tolerance, balance, safety awareness, cognition, functional strength/ROM/coordination of all extremities)    Eating:           - set-up  Grooming:     - set-up  UB bathing:   - stand-by assistance  LB bathing:   -  minimum assistance  UB dressing: - stand-by assistance  LB dressing: - moderate assistance  Toileting:       - moderate assistance    Treatment:   - seated EOB for ADL completion  - unable to reach distal LE, endorses difficulty weightshifting to don/doff briefs     THERAPEUTIC ACTIVITIES: FUNCTIONAL MOBILITY AND BALANCE STATUS   (5 Minutes)     Mobility:  -  Supine to sit -   supervision  -  Sit to supine -  supervision     Transfers:  Not tested    Treatment:    -  Patient engaged in unsupported dynamic sitting balance training to increase postural control, core stability and righting reactions.  Activity purposed to improve independent performance during bed mobility, bathing and dressing at edge of bed.    Patient required SUP  - increased time required  - rest breaks utilized     ACTIVITY TOLERANCE     - motivated to increase activity  - tolerance to sustained activity limited by fatigue  - functional activity tolerance limited by deconditioning  - requires increased time  - requires rest breaks    FINAL LOCATION     Patient positioned in bed all needs within reach agrees to call for assistance (+) bed/chair exit alarm nursing staff notified    Thank you for this referral.    Chiquita Sauers, OTR/L  September 02, 2024          "

## 2024-09-05 ENCOUNTER — Inpatient Hospital Stay: Admit: 2024-09-05 | Payer: Medicare (Managed Care) | Primary: Family Medicine

## 2024-09-05 ENCOUNTER — Encounter: Primary: Family Medicine

## 2024-09-05 ENCOUNTER — Encounter: Payer: Medicare (Managed Care) | Primary: Family Medicine

## 2024-09-05 DIAGNOSIS — L97412 Non-pressure chronic ulcer of right heel and midfoot with fat layer exposed: Principal | ICD-10-CM

## 2024-09-19 ENCOUNTER — Inpatient Hospital Stay: Admit: 2024-09-19 | Payer: Medicare (Managed Care) | Primary: Family Medicine

## 2024-09-19 ENCOUNTER — Encounter: Primary: Family Medicine

## 2024-09-19 DIAGNOSIS — I70234 Atherosclerosis of native arteries of right leg with ulceration of heel and midfoot: Principal | ICD-10-CM

## 2024-10-03 ENCOUNTER — Inpatient Hospital Stay: Admit: 2024-10-03 | Payer: Medicare (Managed Care) | Primary: Family Medicine

## 2024-10-03 DIAGNOSIS — L97829 Non-pressure chronic ulcer of other part of left lower leg with unspecified severity: Principal | ICD-10-CM

## 2024-10-10 ENCOUNTER — Inpatient Hospital Stay: Admit: 2024-10-10 | Payer: Medicare (Managed Care) | Primary: Family Medicine

## 2024-10-10 DIAGNOSIS — I70248 Atherosclerosis of native arteries of left leg with ulceration of other part of lower left leg: Principal | ICD-10-CM

## 2024-10-24 ENCOUNTER — Encounter: Payer: Medicare (Managed Care) | Primary: Family Medicine

## 2024-10-30 ENCOUNTER — Inpatient Hospital Stay: Admit: 2024-10-30 | Payer: Medicare (Managed Care) | Primary: Family Medicine

## 2024-10-30 DIAGNOSIS — I70248 Atherosclerosis of native arteries of left leg with ulceration of other part of lower left leg: Principal | ICD-10-CM

## 2024-11-14 ENCOUNTER — Encounter: Primary: Family Medicine
